# Patient Record
Sex: Female | Born: 1935 | Race: White | Hispanic: No | Smoking: Never smoker
Health system: Southern US, Community
[De-identification: ages and names within clinical notes are randomized; demographics above are authoritative.]

## PROBLEM LIST (undated history)

## (undated) DIAGNOSIS — K08109 Complete loss of teeth, unspecified cause, unspecified class: Secondary | ICD-10-CM

## (undated) DIAGNOSIS — G4733 Obstructive sleep apnea (adult) (pediatric): Secondary | ICD-10-CM

## (undated) DIAGNOSIS — I10 Essential (primary) hypertension: Secondary | ICD-10-CM

## (undated) DIAGNOSIS — I5032 Chronic diastolic (congestive) heart failure: Secondary | ICD-10-CM

## (undated) DIAGNOSIS — Z950 Presence of cardiac pacemaker: Secondary | ICD-10-CM

## (undated) DIAGNOSIS — E785 Hyperlipidemia, unspecified: Secondary | ICD-10-CM

## (undated) DIAGNOSIS — I739 Peripheral vascular disease, unspecified: Secondary | ICD-10-CM

## (undated) DIAGNOSIS — E039 Hypothyroidism, unspecified: Secondary | ICD-10-CM

## (undated) DIAGNOSIS — R931 Abnormal findings on diagnostic imaging of heart and coronary circulation: Secondary | ICD-10-CM

## (undated) DIAGNOSIS — I4821 Permanent atrial fibrillation: Secondary | ICD-10-CM

## (undated) DIAGNOSIS — Z993 Dependence on wheelchair: Secondary | ICD-10-CM

## (undated) DIAGNOSIS — I480 Paroxysmal atrial fibrillation: Secondary | ICD-10-CM

## (undated) DIAGNOSIS — K219 Gastro-esophageal reflux disease without esophagitis: Secondary | ICD-10-CM

## (undated) DIAGNOSIS — Z91041 Radiographic dye allergy status: Secondary | ICD-10-CM

## (undated) DIAGNOSIS — I442 Atrioventricular block, complete: Secondary | ICD-10-CM

## (undated) HISTORY — PX: CARDIAC CATHETERIZATION: SHX172

## (undated) HISTORY — DX: Permanent atrial fibrillation: I48.21

## (undated) HISTORY — PX: CARPAL TUNNEL RELEASE: SHX101

## (undated) HISTORY — DX: Essential (primary) hypertension: I10

## (undated) HISTORY — DX: Hypothyroidism, unspecified: E03.9

## (undated) HISTORY — DX: Gastro-esophageal reflux disease without esophagitis: K21.9

## (undated) HISTORY — DX: Atrioventricular block, complete: I44.2

## (undated) HISTORY — DX: Radiographic dye allergy status: Z91.041

## (undated) HISTORY — DX: Presence of cardiac pacemaker: Z95.0

## (undated) HISTORY — DX: Hyperlipidemia, unspecified: E78.5

## (undated) HISTORY — DX: Chronic diastolic (congestive) heart failure: I50.32

## (undated) HISTORY — PX: CHOLECYSTECTOMY OPEN: SUR202

## (undated) HISTORY — PX: APPENDECTOMY: SHX54

## (undated) HISTORY — DX: Peripheral vascular disease, unspecified: I73.9

## (undated) HISTORY — DX: Abnormal findings on diagnostic imaging of heart and coronary circulation: R93.1

## (undated) HISTORY — DX: Paroxysmal atrial fibrillation: I48.0

## (undated) HISTORY — PX: CARDIOVERSION: SHX1299

## (undated) HISTORY — DX: Obstructive sleep apnea (adult) (pediatric): G47.33

## (undated) HISTORY — PX: VESICOVAGINAL FISTULA CLOSURE W/ TAH: SUR271

---

## 2005-03-11 ENCOUNTER — Emergency Department: Payer: Self-pay | Admitting: Internal Medicine

## 2005-03-11 ENCOUNTER — Other Ambulatory Visit: Payer: Self-pay

## 2006-01-06 ENCOUNTER — Emergency Department (HOSPITAL_COMMUNITY): Admission: EM | Admit: 2006-01-06 | Discharge: 2006-01-06 | Payer: Self-pay | Admitting: Emergency Medicine

## 2006-02-23 ENCOUNTER — Inpatient Hospital Stay (HOSPITAL_COMMUNITY): Admission: RE | Admit: 2006-02-23 | Discharge: 2006-02-26 | Payer: Self-pay | Admitting: Gastroenterology

## 2006-02-24 ENCOUNTER — Encounter (INDEPENDENT_AMBULATORY_CARE_PROVIDER_SITE_OTHER): Payer: Self-pay | Admitting: Specialist

## 2006-02-24 ENCOUNTER — Encounter (INDEPENDENT_AMBULATORY_CARE_PROVIDER_SITE_OTHER): Payer: Self-pay | Admitting: Cardiology

## 2007-08-10 ENCOUNTER — Ambulatory Visit: Payer: Self-pay | Admitting: Internal Medicine

## 2008-02-08 ENCOUNTER — Emergency Department: Payer: Self-pay | Admitting: Emergency Medicine

## 2009-04-24 ENCOUNTER — Encounter: Payer: Self-pay | Admitting: Cardiovascular Disease

## 2009-09-05 DIAGNOSIS — R931 Abnormal findings on diagnostic imaging of heart and coronary circulation: Secondary | ICD-10-CM

## 2009-09-05 HISTORY — DX: Abnormal findings on diagnostic imaging of heart and coronary circulation: R93.1

## 2009-09-08 ENCOUNTER — Inpatient Hospital Stay: Payer: Self-pay | Admitting: Internal Medicine

## 2009-09-08 ENCOUNTER — Ambulatory Visit: Payer: Self-pay | Admitting: Cardiovascular Disease

## 2009-09-08 ENCOUNTER — Encounter: Payer: Self-pay | Admitting: Cardiology

## 2009-09-10 ENCOUNTER — Encounter: Payer: Self-pay | Admitting: Cardiology

## 2009-09-11 ENCOUNTER — Encounter: Payer: Self-pay | Admitting: Cardiology

## 2009-09-19 ENCOUNTER — Encounter: Payer: Self-pay | Admitting: Cardiology

## 2009-09-19 ENCOUNTER — Ambulatory Visit: Payer: Self-pay | Admitting: Cardiology

## 2009-09-19 DIAGNOSIS — I4821 Permanent atrial fibrillation: Secondary | ICD-10-CM | POA: Insufficient documentation

## 2009-09-19 DIAGNOSIS — I4891 Unspecified atrial fibrillation: Secondary | ICD-10-CM

## 2009-09-19 DIAGNOSIS — R0602 Shortness of breath: Secondary | ICD-10-CM | POA: Insufficient documentation

## 2009-09-19 DIAGNOSIS — E785 Hyperlipidemia, unspecified: Secondary | ICD-10-CM | POA: Insufficient documentation

## 2009-09-19 LAB — CONVERTED CEMR LAB: POC INR: 5.2

## 2009-09-22 ENCOUNTER — Telehealth: Payer: Self-pay | Admitting: Cardiovascular Disease

## 2009-09-23 ENCOUNTER — Encounter: Payer: Self-pay | Admitting: Cardiology

## 2009-09-23 ENCOUNTER — Ambulatory Visit: Payer: Self-pay | Admitting: Cardiology

## 2009-09-23 LAB — PULMONARY FUNCTION TEST

## 2009-09-30 ENCOUNTER — Encounter: Payer: Self-pay | Admitting: Cardiology

## 2009-09-30 ENCOUNTER — Ambulatory Visit: Payer: Self-pay | Admitting: Internal Medicine

## 2009-10-01 ENCOUNTER — Encounter: Payer: Self-pay | Admitting: Cardiology

## 2009-10-01 LAB — CONVERTED CEMR LAB
ALT: 16 units/L (ref 0–35)
AST: 24 units/L (ref 0–37)
Albumin: 4.3 g/dL (ref 3.5–5.2)
Alkaline Phosphatase: 67 units/L (ref 39–117)
BUN: 12 mg/dL (ref 6–23)
CO2: 17 meq/L — ABNORMAL LOW (ref 19–32)
Calcium: 9.6 mg/dL (ref 8.4–10.5)
Chloride: 106 meq/L (ref 96–112)
Cholesterol: 294 mg/dL — ABNORMAL HIGH (ref 0–200)
Creatinine, Ser: 0.94 mg/dL (ref 0.40–1.20)
Glucose, Bld: 73 mg/dL (ref 70–99)
HDL: 44 mg/dL (ref >39)
Potassium: 4.5 meq/L (ref 3.5–5.3)
Pro B Natriuretic peptide (BNP): 26.1 pg/mL (ref 0.0–100.0)
Sodium: 137 meq/L (ref 135–145)
Total Bilirubin: 0.4 mg/dL (ref 0.3–1.2)
Total CHOL/HDL Ratio: 6.7
Total Protein: 7.2 g/dL (ref 6.0–8.3)
Triglycerides: 459 mg/dL — ABNORMAL HIGH (ref <150)

## 2009-10-06 LAB — CONVERTED CEMR LAB: Direct LDL: 146 mg/dL — ABNORMAL HIGH

## 2009-10-20 ENCOUNTER — Encounter: Payer: Self-pay | Admitting: Cardiovascular Disease

## 2009-10-20 ENCOUNTER — Ambulatory Visit: Payer: Self-pay | Admitting: Cardiovascular Disease

## 2009-10-20 DIAGNOSIS — I10 Essential (primary) hypertension: Secondary | ICD-10-CM | POA: Insufficient documentation

## 2009-10-20 LAB — CONVERTED CEMR LAB: POC INR: 3.4

## 2009-11-10 ENCOUNTER — Ambulatory Visit: Payer: Self-pay | Admitting: Cardiovascular Disease

## 2009-11-10 LAB — CONVERTED CEMR LAB: POC INR: 2.1

## 2009-12-09 ENCOUNTER — Ambulatory Visit: Payer: Self-pay | Admitting: Cardiovascular Disease

## 2009-12-09 LAB — CONVERTED CEMR LAB: POC INR: 1.5

## 2009-12-19 ENCOUNTER — Ambulatory Visit: Payer: Self-pay | Admitting: Cardiovascular Disease

## 2009-12-19 LAB — CONVERTED CEMR LAB: POC INR: 2.1

## 2010-01-01 ENCOUNTER — Telehealth: Payer: Self-pay | Admitting: Cardiovascular Disease

## 2010-01-07 ENCOUNTER — Ambulatory Visit: Payer: Self-pay | Admitting: Cardiovascular Disease

## 2010-01-07 LAB — CONVERTED CEMR LAB: POC INR: 2

## 2010-01-15 ENCOUNTER — Encounter: Payer: Self-pay | Admitting: Cardiovascular Disease

## 2010-01-21 ENCOUNTER — Telehealth (INDEPENDENT_AMBULATORY_CARE_PROVIDER_SITE_OTHER): Payer: Self-pay | Admitting: *Deleted

## 2010-01-22 ENCOUNTER — Ambulatory Visit: Payer: Self-pay | Admitting: Cardiovascular Disease

## 2010-01-22 ENCOUNTER — Telehealth: Payer: Self-pay | Admitting: Cardiovascular Disease

## 2010-02-04 ENCOUNTER — Ambulatory Visit: Payer: Self-pay | Admitting: Cardiology

## 2010-02-04 LAB — CONVERTED CEMR LAB: POC INR: 2.2

## 2010-02-16 ENCOUNTER — Ambulatory Visit: Payer: Self-pay | Admitting: Cardiovascular Disease

## 2010-02-16 ENCOUNTER — Inpatient Hospital Stay: Payer: Self-pay | Admitting: Internal Medicine

## 2010-02-19 ENCOUNTER — Encounter: Payer: Self-pay | Admitting: Cardiovascular Disease

## 2010-02-20 ENCOUNTER — Telehealth: Payer: Self-pay | Admitting: Cardiovascular Disease

## 2010-02-25 ENCOUNTER — Ambulatory Visit: Payer: Self-pay | Admitting: Cardiovascular Disease

## 2010-03-04 ENCOUNTER — Ambulatory Visit: Payer: Self-pay | Admitting: Cardiology

## 2010-03-04 LAB — CONVERTED CEMR LAB: POC INR: 3.1

## 2010-03-05 ENCOUNTER — Telehealth: Payer: Self-pay | Admitting: Cardiovascular Disease

## 2010-03-18 ENCOUNTER — Telehealth: Payer: Self-pay | Admitting: Cardiovascular Disease

## 2010-03-18 ENCOUNTER — Ambulatory Visit: Payer: Self-pay | Admitting: Cardiovascular Disease

## 2010-03-18 LAB — CONVERTED CEMR LAB: POC INR: 3.3

## 2010-03-27 ENCOUNTER — Telehealth: Payer: Self-pay | Admitting: Cardiovascular Disease

## 2010-04-01 ENCOUNTER — Ambulatory Visit: Payer: Self-pay | Admitting: Cardiovascular Disease

## 2010-04-01 LAB — CONVERTED CEMR LAB: POC INR: 3.5

## 2010-04-07 ENCOUNTER — Ambulatory Visit: Payer: Self-pay | Admitting: Cardiovascular Disease

## 2010-04-08 ENCOUNTER — Telehealth: Payer: Self-pay | Admitting: Cardiovascular Disease

## 2010-04-10 ENCOUNTER — Encounter: Payer: Self-pay | Admitting: Cardiovascular Disease

## 2010-04-10 ENCOUNTER — Ambulatory Visit: Payer: Self-pay | Admitting: Internal Medicine

## 2010-04-13 ENCOUNTER — Ambulatory Visit: Payer: Self-pay | Admitting: Internal Medicine

## 2010-04-13 ENCOUNTER — Encounter: Payer: Self-pay | Admitting: Internal Medicine

## 2010-04-13 DIAGNOSIS — R259 Unspecified abnormal involuntary movements: Secondary | ICD-10-CM | POA: Insufficient documentation

## 2010-04-13 DIAGNOSIS — R5383 Other fatigue: Secondary | ICD-10-CM

## 2010-04-13 DIAGNOSIS — R5381 Other malaise: Secondary | ICD-10-CM | POA: Insufficient documentation

## 2010-04-13 LAB — CONVERTED CEMR LAB: POC INR: 3.3

## 2010-04-22 ENCOUNTER — Ambulatory Visit: Payer: Self-pay | Admitting: Cardiovascular Disease

## 2010-04-22 LAB — CONVERTED CEMR LAB: POC INR: 1.9

## 2010-05-06 ENCOUNTER — Ambulatory Visit: Payer: Self-pay | Admitting: Cardiovascular Disease

## 2010-05-06 LAB — CONVERTED CEMR LAB: POC INR: 1.8

## 2010-05-12 ENCOUNTER — Telehealth: Payer: Self-pay | Admitting: Internal Medicine

## 2010-05-12 ENCOUNTER — Ambulatory Visit: Payer: Self-pay | Admitting: Internal Medicine

## 2010-05-20 ENCOUNTER — Ambulatory Visit: Payer: Self-pay | Admitting: Cardiovascular Disease

## 2010-05-20 LAB — CONVERTED CEMR LAB: POC INR: 2.5

## 2010-06-03 ENCOUNTER — Ambulatory Visit: Payer: Self-pay | Admitting: Cardiovascular Disease

## 2010-06-03 LAB — CONVERTED CEMR LAB: POC INR: 1.7

## 2010-06-10 ENCOUNTER — Ambulatory Visit
Admission: RE | Admit: 2010-06-10 | Discharge: 2010-06-10 | Payer: Self-pay | Source: Home / Self Care | Attending: Internal Medicine | Admitting: Internal Medicine

## 2010-06-10 ENCOUNTER — Encounter: Payer: Self-pay | Admitting: Internal Medicine

## 2010-06-11 ENCOUNTER — Encounter: Payer: Self-pay | Admitting: Cardiovascular Disease

## 2010-06-12 ENCOUNTER — Inpatient Hospital Stay (HOSPITAL_COMMUNITY)
Admission: RE | Admit: 2010-06-12 | Discharge: 2010-06-14 | Payer: Self-pay | Source: Home / Self Care | Attending: Internal Medicine | Admitting: Internal Medicine

## 2010-06-12 HISTORY — PX: ATRIAL ABLATION SURGERY: SHX560

## 2010-06-12 HISTORY — PX: PACEMAKER INSERTION: SHX728

## 2010-06-12 LAB — PROTIME-INR
INR: 1.27 (ref 0.00–1.49)
Prothrombin Time: 16.1 seconds — ABNORMAL HIGH (ref 11.6–15.2)

## 2010-06-17 ENCOUNTER — Ambulatory Visit: Admission: RE | Admit: 2010-06-17 | Discharge: 2010-06-17 | Payer: Self-pay | Source: Home / Self Care

## 2010-06-22 LAB — CBC
HCT: 34.4 % — ABNORMAL LOW (ref 36.0–46.0)
Hemoglobin: 11.6 g/dL — ABNORMAL LOW (ref 12.0–15.0)
MCH: 30.9 pg (ref 26.0–34.0)
MCHC: 33.7 g/dL (ref 30.0–36.0)
MCV: 91.7 fL (ref 78.0–100.0)
Platelets: 191 10*3/uL (ref 150–400)
RBC: 3.75 MIL/uL — ABNORMAL LOW (ref 3.87–5.11)
RDW: 14.4 % (ref 11.5–15.5)
WBC: 14.1 10*3/uL — ABNORMAL HIGH (ref 4.0–10.5)

## 2010-06-22 LAB — BASIC METABOLIC PANEL
BUN: 25 mg/dL — ABNORMAL HIGH (ref 6–23)
BUN: 24 mg/dL — ABNORMAL HIGH (ref 6–23)
CO2: 23 mEq/L (ref 19–32)
CO2: 22 mEq/L (ref 19–32)
Calcium: 8.9 mg/dL (ref 8.4–10.5)
Calcium: 9.2 mg/dL (ref 8.4–10.5)
Chloride: 100 mEq/L (ref 96–112)
Chloride: 102 mEq/L (ref 96–112)
Creatinine, Ser: 1.35 mg/dL — ABNORMAL HIGH (ref 0.4–1.2)
Creatinine, Ser: 1.18 mg/dL (ref 0.4–1.2)
GFR calc Af Amer: 47 mL/min — ABNORMAL LOW (ref >60)
GFR calc Af Amer: 54 mL/min — ABNORMAL LOW (ref >60)
GFR calc non Af Amer: 38 mL/min — ABNORMAL LOW (ref >60)
GFR calc non Af Amer: 45 mL/min — ABNORMAL LOW (ref >60)
Glucose, Bld: 116 mg/dL — ABNORMAL HIGH (ref 70–99)
Glucose, Bld: 103 mg/dL — ABNORMAL HIGH (ref 70–99)
Potassium: 3.8 mEq/L (ref 3.5–5.1)
Potassium: 3.7 mEq/L (ref 3.5–5.1)
Sodium: 134 mEq/L — ABNORMAL LOW (ref 135–145)
Sodium: 136 mEq/L (ref 135–145)

## 2010-06-22 LAB — PROTIME-INR
INR: 1.13 (ref 0.00–1.49)
INR: 1.2 (ref 0.00–1.49)
Prothrombin Time: 14.7 seconds (ref 11.6–15.2)
Prothrombin Time: 15.4 seconds — ABNORMAL HIGH (ref 11.6–15.2)

## 2010-06-23 ENCOUNTER — Telehealth: Payer: Self-pay | Admitting: Cardiovascular Disease

## 2010-06-24 ENCOUNTER — Telehealth: Payer: Self-pay | Admitting: Cardiovascular Disease

## 2010-06-25 ENCOUNTER — Encounter: Payer: Self-pay | Admitting: Cardiovascular Disease

## 2010-06-25 NOTE — Op Note (Signed)
  NAMECHRISTIANE, SISTARE             ACCOUNT NO.:  [id number]  MEDICAL RECORD NO.:  [id number]          PATIENT TYPE:  INP  LOCATION:  2909                         FACILITY:  [facility]  PHYSICIAN:  Duke Salvia, MD, FACCDATE OF BIRTH:  1934-06-20  DATE OF PROCEDURE:  06/12/2010 DATE OF DISCHARGE:                              OPERATIVE REPORT   PREOPERATIVE DIAGNOSIS:  Status post pacer with uncontrolled ventricular response.  POSTOPERATIVE DIAGNOSIS:  Status post pacer with uncontrolled ventricular response.  PROCEDURE:  HV recording and AV junction ablation.  SURGEON:  Duke Salvia, MD, [facility]  DESCRIPTION OF PROCEDURE:  Following obtaining informed consent and placement of the pacemaker, the right femoral vein was cannulated.  A CVP was measured and it was 18-20 cm.  A SR-0 sheath was placed and through this was passed a 4-mm tip ablation catheter.  A His recording of 51 milliseconds was identified.  A total of 30 seconds of RF was applied.  Complete heart block ensued.  A total of 16 minutes and 30 seconds of RF was utilized for the combined procedure, 1 minute and 9 seconds of which was utilized for this procedure.  The patient tolerated the procedure without apparent complication.  The patient had no underlying rhythm.     Duke Salvia, MD, [facility]     SCK/MEDQ  D:  06/12/2010  T:  06/12/2010  Job:  [id number]  Electronically Signed by Sherryl Manges MD [facility] on 06/25/2010 01:40:41 PM

## 2010-06-25 NOTE — Op Note (Signed)
Jacqueline Lozano, Jacqueline Lozano             ACCOUNT NO.:  [id number]  MEDICAL RECORD NO.:  [id number]          PATIENT TYPE:  INP  LOCATION:  2909                         FACILITY:  [facility]  PHYSICIAN:  Duke Salvia, MD, FACCDATE OF BIRTH:  May 03, 1935  DATE OF PROCEDURE:  06/12/2010 DATE OF DISCHARGE:                              OPERATIVE REPORT   PREOPERATIVE DIAGNOSIS:  Atrial fibrillation with uncontrolled ventricular response with anticipated atrioventricular junction ablation.  POSTOPERATIVE DIAGNOSIS:  Atrial fibrillation with uncontrolled ventricular response with anticipated atrioventricular junction ablation.  PROCEDURE:  Dual-chamber pacemaker implantation with a left ventricular lead placement.  Following obtaining informed consent, the patient was brought to the electrophysiology laboratory and placed on the fluoroscopic table in supine position.  After routine prep and drape of the left upper chest, lidocaine was infiltrated in the prepectoral subclavicular region. Incision was made and carried down to layer of the prepectoral fascia using electrocautery and sharp dissection.  A pocket was formed similarly.  Hemostasis was obtained.  Thereafter, attention was turned gain to the access to extrathoracic left subclavian vein, which was accomplished without difficulty without the aspiration of air or puncture of the artery.  Two separate venipunctures were accomplished.  Guidewires were placed and retained. Sequentially, a 7-French and 9.5-French sheath were placed, which were passed a Medtronic 5076, 52-cm active fixation ventricular lead, serial number [id number] and a Medtronic MB2 coronary sinus cannulation catheter was subsequently replaced with a multipurpose catheter.  The RV lead was manipulated under fluoroscopic guidance through the RV apex where the bipolar R-wave was 9.4 with a pace impedance of 691, a threshold of 1 volt at 0.5.  The current threshold was 1.3  mA.  There was no diaphragmatic pacing at 10 volts and the current of injury was brisk.  This lead was secured to the prepectoral fascia.  The MB2 did not allow for cannulation of the coronary sinus, the multipurpose did.  There was a valve halfway up.  We were able to get past that.  There was a low lateral vein, which we had difficulty cannulating, so we went up high, we found a high lateral vein that actually coursed down on to the midlateral portion.  We then placed a wire into this vein and tried with a Medtronic 4396 lead to place it. However, across the length of stability, there was inadequate ventricular capture.  We then removed the lead under fluoroscopy and placed a [employer] QuickFlex 1258T lead, serial number [id number].  It was placed at a junction between the mid and distal third and in this location, the bipolar L-wave was 12.6 with a pace impedance of 1385, a pacing threshold 3.4 volts at 0.5 milliseconds, and current threshold is 4.3 mA.  There is no diaphragmatic pacing at 10 volts.  This lead was secured to the prepectoral fascia.  Following removal of the delivery system and the leads were then attached to Medtronic C4TR01 pulse generator, serial number [id number].  The leads and pulse generator were placed in the pocket, secured to the prepectoral fascia.  Hemostasis having been obtained, the pocket remained copiously irrigated with antibiotic containing saline solution.  The wound was closed in three layers in normal fashion.  The wound was washed, dried,  and a benzoin Steri-Strip dressing was applied.  Needle counts, sponge counts, and instrument counts were correct at the end of the procedure according to staff.  The patient tolerated the procedure without apparent complication.     Duke Salvia, MD, [facility]     SCK/MEDQ  D:  06/12/2010  T:  06/12/2010  Job:  [id number]  Electronically Signed by Sherryl Manges MD [facility] on 06/25/2010 01:40:43 PM

## 2010-06-26 ENCOUNTER — Ambulatory Visit: Admission: RE | Admit: 2010-06-26 | Discharge: 2010-06-26 | Payer: Self-pay | Source: Home / Self Care

## 2010-06-26 ENCOUNTER — Encounter: Payer: Self-pay | Admitting: Internal Medicine

## 2010-06-29 ENCOUNTER — Inpatient Hospital Stay: Payer: Self-pay | Admitting: Specialist

## 2010-06-29 ENCOUNTER — Encounter: Payer: Self-pay | Admitting: Cardiovascular Disease

## 2010-06-30 ENCOUNTER — Encounter: Payer: Self-pay | Admitting: Cardiovascular Disease

## 2010-07-01 ENCOUNTER — Encounter: Payer: Self-pay | Admitting: Cardiovascular Disease

## 2010-07-02 LAB — SURGICAL PCR SCREEN
MRSA, PCR: NEGATIVE
Staphylococcus aureus: NEGATIVE

## 2010-07-03 ENCOUNTER — Telehealth: Payer: Self-pay | Admitting: Cardiovascular Disease

## 2010-07-07 NOTE — Medication Information (Signed)
Summary: rov/ewj  Anticoagulant Therapy  Managed by: Cloyde Reams, RN, BSN Referring MD: Mariah Milling PCP: Dr. Moses Manners MD: Mariah Milling Indication 1: Atrial Fibrillation Lab Used: [facility] Site: [place] INR POC 3.3 INR RANGE 2.0-3.0  Dietary changes: no    Health status changes: no    Bleeding/hemorrhagic complications: no    Recent/future hospitalizations: no    Any changes in medication regimen? no    Recent/future dental: no  Any missed doses?: no       Is patient compliant with meds? yes       Allergies: 1)  ! Codeine 2)  ! Percocet 3)  ! Morphine 4)  ! Sulfa 5)  ! Persantine 6)  ! * Statins 7)  ! Iodine 8)  ! * Ivp Dye  Anticoagulation Management History:      The patient is taking warfarin and comes in today for a routine follow up visit.  Positive risk factors for bleeding include an age of 75 years or older.  The bleeding index is 'intermediate risk'.  Positive CHADS2 values include History of HTN.  Negative CHADS2 values include Age > 23 years old.  Anticoagulation responsible provider: [name].  INR POC: 3.3.  Cuvette Lot#: [id number].  Exp: 04/2011.    Anticoagulation Management Assessment/Plan:      The patient's current anticoagulation dose is Coumadin 4 mg tabs: Take as directed.  The target INR is 2.0-3.0.  The next INR is due 04/01/2010.  Anticoagulation instructions were given to patient.  Results were reviewed/authorized by Cloyde Reams, RN, BSN.  She was notified by Cloyde Reams RN.         Prior Anticoagulation Instructions: INR 3.1  Start taking 1.5 tablets daily except 1 tablet on Wednesdays and Saturdays.  Recheck in 2 weeks.    Current Anticoagulation Instructions: INR 3.3  Skip today's dosage of Coumadin, then resume 1.5 tablets daily except 1 tablet on Wednesdays and Saturdays.  Recheck in 2 weeks.   Prescriptions: CARDIZEM CD 240 MG XR24H-CAP (DILTIAZEM HCL COATED BEADS) Take 1 tablet by mouth once a day   #30 x 6   Entered by:   Cloyde Reams RN   Authorized by:   [name] MD   Signed by:   Cloyde Reams RN on 03/18/2010   Method used:   Electronically to        [facility] [address] (retail)       [address]       [place], [place]  [address]       Ph: [phone]       Fax: [phone]   RxID:   [id number]

## 2010-07-07 NOTE — Progress Notes (Signed)
Summary: HA, blurred vision, and hypertension  Phone Note Call from Patient Call back at Home Phone [phone]   Reason for Call: Talk to Doctor Summary of Call: Returned call from patient stating she was feeling a little loopy, with a HA, blurred vision, and SBP of 209.  She has known Afib and states she feels as if her heart is pounding out of her chest.  She says the HA and blurred vision feel like her migraines that she has had in the past.  She has taken all her prescribed medication for the day and is s/p cardioversion in April.  She has 3 tablets of metoprolol 50 mg left over and wanted to know if she could take them.  I have informed her she can try taking one pill and if symptoms are not improved she needs to go the the ER for further evaluation.  She also needs to schedule a follow up tomorrow if she decides not to go to the ER to ensure she is not back in Afib.  She voices understanding and says she will go to the ER if her symptoms are not improved within the hour.  Initial call taken by: Robbi Garter NP-PA,  January 21, 2010 8:24 PM

## 2010-07-07 NOTE — Medication Information (Signed)
Summary: [facility]  Anticoagulant Therapy  Managed by: Charlena Cross, RN, BSN Referring MD: Cherlynn Polo MD: Shirlee Latch MD, Ziza Hastings Indication 1: Atrial Fibrillation Lab Used: [facility] [place] Site: [place] INR POC 5.2 INR RANGE 2.0-3.0  Dietary changes: no    Health status changes: no    Bleeding/hemorrhagic complications: no    Recent/future hospitalizations: no    Any changes in medication regimen? no    Recent/future dental: no  Any missed doses?: no       Is patient compliant with meds? yes       Allergies: 1)  ! Codeine 2)  ! Percocet 3)  ! Morphine 4)  ! Sulfa 5)  ! Persantine 6)  ! * Statins 7)  ! Iodine 8)  ! * Ivp Dye  Anticoagulation Management History:      The patient comes in today for her initial visit for anticoagulation therapy.  Positive risk factors for bleeding include an age of 74 years or older.  The bleeding index is 'intermediate risk'.  Negative CHADS2 values include Age > 74 years old.  Anticoagulation responsible provider: Shirlee Latch MD, Aarya Robinson.  INR POC: 5.2.    Anticoagulation Management Assessment/Plan:      The patient's current anticoagulation dose is Coumadin 4 mg tabs: Take as directed.  The target INR is 2.0-3.0.  The next INR is due 09/26/2009.  Anticoagulation instructions were given to patient.  Results were reviewed/authorized by Charlena Cross, RN, BSN.  She was notified by Charlena Cross, RN, BSN.         Current Anticoagulation Instructions: No coumadin for 3 days then coumadin 6 mg daily with 8 mg on Tues and Thurs

## 2010-07-07 NOTE — Progress Notes (Signed)
Summary: RX  Phone Note Refill Request Call back at Home Phone [phone] Message from:  Patient on March 18, 2010 2:34 PM  Refills Requested: Medication #1:  CARDIZEM CD 240 MG XR24H-CAP Take 1 tablet by mouth once a day. [facility] [address]  Initial call taken by: Harlon Flor,  March 18, 2010 2:35 PM    Prescriptions: CARDIZEM CD 240 MG XR24H-CAP (DILTIAZEM HCL COATED BEADS) Take 1 tablet by mouth once a day  #30 x 6   Entered by:   Bishop Dublin, CMA   Authorized by:   [name] MD   Signed by:   Bishop Dublin, CMA on 03/18/2010   Method used:   Electronically to        [facility] [address] (retail)       [address]       [place], [place]  [address]       Ph: [phone]       Fax: [phone]   RxID:   [id number]

## 2010-07-07 NOTE — Progress Notes (Signed)
Summary: Pacmaker  Phone Note Call from Patient Call back at [facility] Phone [phone]   Caller: Self Call For: Jacqueline Lozano Summary of Call: Pt has questions about a pacemaker.  She and Jacqueline Lozano discussed getting one at her appt today and it is not noted in her instruction sheet that she was given at her appt today. Initial call taken by: Harlon Flor,  May 12, 2010 4:28 PM  Follow-up for Phone Call        Called spoke with pt advised we will be scheduling her ablation and pacer insertion for January.  Pt states she has an appt with Dr Jacqueline Lozano on 06/10/10 and does not want appt before then.  Advised pt we will schedule procedure after that appt date.  Follow-up by: Cloyde Reams RN,  May 14, 2010 10:05 AM

## 2010-07-07 NOTE — Progress Notes (Signed)
Summary: QUESTION ABOUT MEDS  Phone Note Call from Patient Call back at Home Phone [phone]   Caller: SELF Call For: [facility] Summary of Call: QUESTION ABOUT AMIODERONE AND LOPRESSOR Initial call taken by: Harlon Flor,  March 27, 2010 10:48 AM  Follow-up for Phone Call        Questions answered.  Follow-up by: Benedict Needy, RN,  March 27, 2010 11:24 AM

## 2010-07-07 NOTE — Assessment & Plan Note (Addendum)
Summary: F1M/AMD   Visit Type:  Follow-up Primary Provider:  Dr. Burnett Sheng  CC:  1 mth f/u. c/o SOB. Denies chest pain.Marland Kitchen  History of Present Illness: 76 yo with history of paroxysmal atrial fibrillation presents for followup of recent hospitalization with afib/rapid ventricular response.  Patient had been on sotalol for about the last 2 years but had had episodes of breakthrough atrial fibrillation on sotalol.  She was noted by her PCP at a routine appointment in 4/11 to be in atrial fib with RVR (in the setting of a UTI).  She was admitted to [facility].  She was started on amiodarone and underwent DCCV to NSR.  She was discharged home on amiodarone 200 mg two times a day. she was seen last month for rapid atrial fibrillation in the hospital and again went through a DC cardioversion with discharge on diltiazem, beta blockers, amiodarone. she was successfully holding sinus rhythm.  She presents today in atrial fibrillation with rates greater than 130 at rest, symptoms of mild shortness of breath. She reports that she has been taking her medications on a consistent basis though recently her amiodarone was decreased from 200 mg b.i.d. to daily. She does have obstructive sleep apnea but uses her CPAP on a consistent basis. she believes that one of the medications, Lopressor or amiodarone, is causing a significant tremor.  PFTs were done by Dr. Meredeth Ide which suggested some restrictive and obstructive deficits. Please see the report for full details. It was suggested that her weight may be playing a role in her shortness of breath.  labs from September 30, 2009 shows total cholesterol 294, LDL 146, triglycerides 459, HDL 44. She states that she has muscle  problems on statins.  She continues wi significant sob  ; recent amio level 0.8  Current Medications (verified): 1)  Prilosec 20 Mg Cpdr (Omeprazole) .... Take 1 Tablet By Mouth Two Times A Day 2)  Synthroid 125 Mcg Tabs (Levothyroxine Sodium) .... Take 1 By  Mouth Once Daily 3)  Losartan Potassium 100 Mg Tabs (Losartan Potassium) .... Take 1 Tablet By Mouth Once A Day 4)  Coumadin 4 Mg Tabs (Warfarin Sodium) .... Take As Directed 5)  Symbicort 160-4.5 Mcg/act Aero (Budesonide-Formoterol Fumarate) .... Up To 2 Puffs Once Daily 6)  B Complex-B12  Tabs (B Complex Vitamins) .... Take 1 By Mouth Once Daily 7)  Fioricet 50-325-40 Mg Tabs (Butalbital-Apap-Caffeine) .... Take Up To 2 By Mouth Up To Four Times A Day As Needed 8)  Vitamin D .... Every Week 9)  Furosemide 20 Mg Tabs (Furosemide) .... Take One Tablet By Mouth Daily. (Stopped 10/15/09) 10)  Klor-Con M10 10 Meq Cr-Tabs (Potassium Chloride Crys Cr) .Marland Kitchen.. 1 Tab By Mouth Daily (Stopped 10/15/09) 11)  Metoprolol Tartrate 50 Mg Tabs (Metoprolol Tartrate) .... Take 1 1/2  Tablets By Mouth Two Times A Day 12)  Cardizem Cd 240 Mg Xr24h-Cap (Diltiazem Hcl Coated Beads) .... Take 1 Tablet By Mouth Two Times A Day  Allergies (verified): 1)  ! Codeine 2)  ! Percocet 3)  ! Morphine 4)  ! Sulfa 5)  ! Persantine 6)  ! * Statins 7)  ! Iodine 8)  ! * Ivp Dye  Past History:  Past Medical History: Last updated: 09/19/2009 1. HTN 2. Hypothyroidism 3. Hyperlipidemia: has been unable to take statins due to muscle pain.  Has tried multiple statins per her report. ' 4. GERD 5. Migraines 6. OSA: not consistently using CPAP 7. CCY  8. Left heart cath  x 3 in the past, last 5 years ago.  All "normal" per her report.   9. Contrast allergy 10. Echo (4/11): EF > 55%, mild LVH, moderate LAE, normal RV, normal PA systolic pressure 11. PAD: occlusive disease involving left PT and AT.  12. Paroxysmal atrial fibrillation: Breakthrough afib on sotalol, changed to amiodarone 4/11.  DCCV 4/11.   Family History: Last updated: 09/19/2009 Father with MI at age 65. Daughter with colon cancer. Mother with MI x 2.   Social History: Last updated: 09/19/2009 From [place], widowed, never smoked.  Lives alone.    Vital  Signs:  Patient profile:   76 year old female Height:      62.5 inches Weight:      223.75 pounds BMI:     40.42 Pulse rate:   118 / minute BP sitting:   122 / 68  (left arm) Cuff size:   large  Vitals Entered By: Lysbeth Galas CMA (May 12, 2010 9:08 AM)  Physical Exam  General:  The patient was alert and oriented in no acute distress.Neck veins were flat, carotids were brisk. Lungs were clear. Heart sounds were irregular without murmurs or gallops. Abdomen was soft with active bowel sounds. There is no clubbing cyanosis; tr aedema  Mouth:  Teeth, gums and palate normal. Oral mucosa normal.   Impression & Recommendations:  Problem # 1:  ATRIAL FIBRILLATION (ICD-427.31) we discussed rate v ryhtm and PVI  she is disinclined to pursue PVI with60% effectiveness, and welath of exposures to failure of theprocedure  We then broached AV ablation and CRT-P  She would like to pursue this, despite  device dependence and data suggesting imporvecd outcomes in PVI.  She is attarctedt o the simplicity of theporcedure, the high effectivensss.   We will schedule after christmas for now, will increasse lopressor, resume furosemid, and start dig Her updated medication list for this problem includes:    Coumadin 4 Mg Tabs (Warfarin sodium) .Marland Kitchen... Take as directed    Metoprolol Tartrate 50 Mg Tabs (Metoprolol tartrate) .Marland Kitchen... Take 1 1/2  tablets by mouth two times a day  Problem # 2:  DYSPNEA (ICD-786.05) this is manifestation of chf -diasystoklic Her updated medication list for this problem includes:    Losartan Potassium 100 Mg Tabs (Losartan potassium) .Marland Kitchen... Take 1 tablet by mouth once a day    Furosemide 20 Mg Tabs (Furosemide) .Marland Kitchen... Take one tablet by mouth daily. (stopped 10/15/09)    Metoprolol Tartrate 50 Mg Tabs (Metoprolol tartrate) .Marland Kitchen... Take 2 tablets two times a day    Cardizem Cd 240 Mg Xr24h-cap (Diltiazem hcl coated beads) .Marland Kitchen... Take 1 tablet by mouth two times a day    Digoxin 0.25  Mg Tabs (Digoxin) .Marland Kitchen... Take one tablet by mouth daily  Problem # 3:  HYPERTENSION, BENIGN (ICD-401.1) reasonablycontrolled  Her updated medication list for this problem includes:    Losartan Potassium 100 Mg Tabs (Losartan potassium) .Marland Kitchen... Take 1 tablet by mouth once a day    Furosemide 20 Mg Tabs (Furosemide) .Marland Kitchen... Take one tablet by mouth daily. (stopped 10/15/09)    Metoprolol Tartrate 50 Mg Tabs (Metoprolol tartrate) .Marland Kitchen... Take 2 tablets two times a day    Cardizem Cd 240 Mg Xr24h-cap (Diltiazem hcl coated beads) .Marland Kitchen... Take 1 tablet by mouth two times a day  Patient Instructions: 1)  Your physician has recommended you make the following change in your medication: Start Digoxin 0.25mg . Increase Metoprolol to 2 tablets two times a day.  Resume Furosemide 20mg  once daily.  2)  TO BE SCHEDULED JAN 2012: Your physician has recommended that you have an ablation.  Catheter ablation is a medical procedure used to treat some cardiac arrhythmias (irregular heartbeats). During catheter ablation, a long, thin, flexible tube is put into a blood vessel in your groin (upper thigh), or neck. This tube is called an ablation catheter. It is then guided to your heart through the blood vessel. Radiofrequency waves destroy small areas of heart tissue where abnormal heartbeats may cause an arrhythmia to start.  Please see the instruction sheet given to you today. 3)  Your physician recommends that you schedule a follow-up appointment in: 3 weeks with Dr Graciela Husbands Prescriptions: METOPROLOL TARTRATE 50 MG TABS (METOPROLOL TARTRATE) Take 2 tablets two times a day  #120 x 6   Entered by:   [name] RN   Authorized by:   Nathen May, MD, [facility]   Signed by:   [name] RN on 05/12/2010   Method used:   Electronically to        [facility] [address] (retail)       [address], [address]       [place], [place]  [address]       Ph: [phone]       Fax: [phone]   RxID:    [id number] DIGOXIN 0.25 MG TABS (DIGOXIN) Take one tablet by mouth daily  #30 x 6   Entered by:   [name] RN   Authorized by:   Nathen May, MD, [facility]   Signed by:   [name] RN on 05/12/2010   Method used:   Electronically to        [facility] [address] (retail)       [address], [address]       [place], [place]  [address]       Ph: [phone]       Fax: [phone]   RxID:   [id number]

## 2010-07-07 NOTE — Progress Notes (Signed)
Summary: MEDICATION  Phone Note Call from Patient Call back at Home Phone [phone]   Caller: SELF  Call For: [name] Summary of Call: PT WANTS TO KNOW IF SHE IS TO CONTINUE TAKING SOTALOL HCL 120 MG-IT WAS NOT LISTED ON HER DISCHARGE INSTRUCTIONS Initial call taken by: Harlon Flor,  February 20, 2010 11:32 AM  Follow-up for Phone Call        pt's meds changed while in [facility] reviewed medlist from discharge pt is not to take sotalol. Benedict Needy, RN  February 20, 2010 11:48 AM

## 2010-07-07 NOTE — Assessment & Plan Note (Signed)
Summary: NPH/AMD   CC:  Post Hosp F/U after cardioversion; C/O SOB.  Current Medications (verified): 1)  Prevacid 30 Mg Cpdr (Lansoprazole) .... Take 1 By Mouth Two Times A Day 2)  Toprol Xl 50 Mg Xr24h-Tab (Metoprolol Succinate) .... Take 1 1/2 Tablets By Mouth Once Daily 3)  Synthroid 125 Mcg Tabs (Levothyroxine Sodium) .... Take 1 By Mouth Once Daily 4)  Diovan 80 Mg Tabs (Valsartan) .... Take One Tablet By Mouth Two Times A Day 5)  Amiodarone Hcl 200 Mg Tabs (Amiodarone Hcl) .... Take One Tablet By Mouth Two Times A Day 6)  Coumadin 4 Mg Tabs (Warfarin Sodium) .... Take As Directed 7)  Cardizem Cd 240 Mg Xr24h-Cap (Diltiazem Hcl Coated Beads) .... Take 1 By Mouth Once Daily 8)  Symbicort 160-4.5 Mcg/act Aero (Budesonide-Formoterol Fumarate) .... Up To 2 Puffs Once Daily 9)  B Complex-B12  Tabs (B Complex Vitamins) .... Take 1 By Mouth Once Daily 10)  Fioricet 50-325-40 Mg Tabs (Butalbital-Apap-Caffeine) .... Take Up To 2 By Mouth Up To Four Times A Day As Needed 11)  Vitamin D .... Every Week  Allergies (verified): 1)  ! Codeine 2)  ! Percocet 3)  ! Morphine 4)  ! Sulfa 5)  ! Persantine 6)  ! * Statins 7)  ! Iodine 8)  ! * Ivp Dye  Vital Signs:  Patient profile:   76 year old female Height:      63 inches Weight:      223.75 pounds BMI:     39.78 Pulse rate:   74 / minute BP sitting:   144 / 72  (left arm)  Vitals Entered By: Stanton Kidney, EMT-P (September 19, 2009 11:05 AM)   Other Orders: Full Pulmonary Function Test (PFT)  Patient Instructions: 1)  Your physician recommends that you schedule a follow-up appointment in: 1 month 2)  Your physician recommends that you return for a FASTING lipid profile: 10 days (cmet, lipids, BNP) 3)  Your physician has recommended you make the following change in your medication: start lasix 20 mg daily, start potassium 10 mEq daily, decrease amiodarone to 200 mg daily 4)  Your physician has recommended that you have a pulmonary  function test.  Pulmonary Function Tests are a group of tests that measure how well air moves in and out of your lungs. 09/23/09 @ 7:30am.  You may have a light breakfast.   Prescriptions: KLOR-CON M10 10 MEQ CR-TABS (POTASSIUM CHLORIDE CRYS CR) 1 tab by mouth daily  #30 x 6   Entered by:   Charlena Cross, RN, BSN   Authorized by:   Marca Ancona, MD   Signed by:   Charlena Cross, RN, BSN on 09/19/2009   Method used:   Electronically to        [facility] [address] (retail)       [address], [address]       [place], [place]  [address]       Ph: [phone]       Fax: [phone]   RxID:   [id number] FUROSEMIDE 20 MG TABS (FUROSEMIDE) Take one tablet by mouth daily.  #30 x 6   Entered by:   Charlena Cross, RN, BSN   Authorized by:   Marca Ancona, MD   Signed by:   Charlena Cross, RN, BSN on 09/19/2009   Method used:   Electronically to        [facility]  [address] (retail)       [address]       [place], [place]  [address]       Ph: [phone]       Fax: [phone]   RxID:   [id number]   Appended Document: NPH/AMD     Primary Provider:  Dr. Burnett Sheng   History of Present Illness: 76 yo with history of paroxysmal atrial fibrillation presents for followup of recent hospitalization with afib/rapid ventricular response.  Patient had been on sotalol for about the last 2 years but had had episodes of breakthrough atrial fibrillation on sotalol.  She was noted by her PCP at a routine appointment in 74/11 to be in atrial fib with RVR (in the setting of a UTI).  She was admitted to [facility].  She was started on amiodarone and underwent DCCV to NSR.  She was discharged home on amiodarone 200 mg two times a day.  Since coming home, she has noted more exertional dyspnea than usual. She is short of breath after walking about 30 feet (distance to mailbox).  Before, she could walk up to a block  without being short of breath.  She sleeps on 2 pillows (probably some orthopnea).  No PND.  She has had some GI discomfort since starting amiodarone.    ECG: NSR, normal  Labs (4/11): creatinine 1.04, HCT 33.9, TSH normal  Allergies: 1)  ! Codeine 2)  ! Percocet 3)  ! Morphine 4)  ! Sulfa 5)  ! Persantine 6)  ! * Statins 7)  ! Iodine 8)  ! * Ivp Dye  Past History:  Past Medical History: 1. HTN 2. Hypothyroidism 3. Hyperlipidemia: has been unable to take statins due to muscle pain.  Has tried multiple statins per her report. ' 4. GERD 5. Migraines 6. OSA: not consistently using CPAP 7. CCY  8. Left heart cath x 3 in the past, last 5 years ago.  All "normal" per her report.   9. Contrast allergy 10. Echo (4/11): EF > 55%, mild LVH, moderate LAE, normal RV, normal PA systolic pressure 11. PAD: occlusive disease involving left PT and AT.  12. Paroxysmal atrial fibrillation: Breakthrough afib on sotalol, changed to amiodarone 4/11.  DCCV 4/11.   Family History: Father with MI at age 29. Daughter with colon cancer. Mother with MI x 2.   Social History: From [place], widowed, never smoked.  Lives alone.    Review of Systems       All systems reviewed and negative except as per HPI.   Physical Exam  General:  Well developed, well nourished, in no acute distress.  Obese.  Head:  normocephalic and atraumatic Nose:  no deformity, discharge, inflammation, or lesions Mouth:  Teeth, gums and palate normal. Oral mucosa normal. Neck:  Neck supple, JVP 8-9 cm. No masses, thyromegaly or abnormal cervical nodes. Lungs:  Clear bilaterally to auscultation and percussion. Heart:  Non-displaced PMI, chest non-tender; regular rate and rhythm, S1, S2 without rubs or gallops. 2/6 early systolic murmur RUSB. Carotid upstroke normal, no bruit. Feet are warm but I cannot palpate pedal pulses. No edema, no varicosities. Abdomen:  Bowel sounds positive; abdomen soft and non-tender without  masses, organomegaly, or hernias noted. No hepatosplenomegaly. Msk:  Back normal, normal gait. Muscle strength and tone normal. Extremities:  No clubbing or cyanosis. Neurologic:  Alert and oriented x 3. Skin:  Intact without lesions or rashes. Psych:  Normal affect.   Impression & Recommendations:  Problem # 1:  ATRIAL FIBRILLATION (ICD-427.31) Patient is in NSR today s/p DCCV.  She was taken off sotalol and is now on amiodarone.  She is having some GI upset that could be linked to amiodarone.  I will have her decrease amiodarone to 200 mg daily. She will need baseline PFTs.  She has had recent TSH.  Will check LFTs.  If she is on amiodarone for a prolonged time, she will need ophthalmic exam.  Continue coumadin, can consider switch to Pradaxa if her insurance will cover.   Problem # 2:  DYSPNEA (ICD-786.05) Patient appears to have diastolic CHF with some volume overload on exam today.  Start Lasix 20 mg daily + KCl 10 mEq daily.  BMET, BNP in 10 days.   She will followup in 1 month with Dr. Mariah Milling.   Problem # 3:  HYPERLIPIDEMIA-MIXED (ICD-272.4) Will check lipids. She has been unable to tolerate statins.

## 2010-07-07 NOTE — Medication Information (Signed)
Summary: [facility]  Anticoagulant Therapy  Managed by: Cloyde Reams, RN, BSN Referring MD: Mariah Milling PCP: Dr. Moses Manners MD: Mariah Milling Indication 1: Atrial Fibrillation Lab Used: [facility] [place] Site: [place] INR POC 1.5 INR RANGE 2.0-3.0  Dietary changes: no     Bleeding/hemorrhagic complications: no     Any changes in medication regimen? no     Any missed doses?: no       Is patient compliant with meds? yes       Allergies: 1)  ! Codeine 2)  ! Percocet 3)  ! Morphine 4)  ! Sulfa 5)  ! Persantine 6)  ! * Statins 7)  ! Iodine 8)  ! * Ivp Dye  Anticoagulation Management History:      The patient is taking warfarin and comes in today for a routine follow up visit.  Positive risk factors for bleeding include an age of 4 years or older.  The bleeding index is 'intermediate risk'.  Positive CHADS2 values include History of HTN.  Negative CHADS2 values include Age > 38 years old.  Anticoagulation responsible provider: [name].  INR POC: 1.5.  Cuvette Lot#: [id number].  Exp: 02/2011.    Anticoagulation Management Assessment/Plan:      The patient's current anticoagulation dose is Coumadin 4 mg tabs: Take as directed.  The target INR is 2.0-3.0.  The next INR is due 12/19/2009.  Anticoagulation instructions were given to patient.  Results were reviewed/authorized by Cloyde Reams, RN, BSN.  She was notified by Benedict Needy, RN.         Prior Anticoagulation Instructions: INR 2.1  Continue on same dose of 1.5 tablet daily except 2 tablets on Thursday.   Recheck in 4 weeks  Current Anticoagulation Instructions: INR 1.5  Take 2 tablets Tuesday and  Wednesday.  Then continue taking 1.5 tabs daily except 2 tabs on Thursday. Recheck in 10 days.

## 2010-07-07 NOTE — Medication Information (Signed)
Summary: [facility]  Anticoagulant Therapy  Managed by: Cloyde Reams, RN, BSN Referring MD: Mariah Milling PCP: Dr. Moses Manners MD: Mariah Milling Indication 1: Atrial Fibrillation Lab Used: [facility] [place] Site: [place] INR POC 3.4 INR RANGE 2.0-3.0    Bleeding/hemorrhagic complications: no     Any changes in medication regimen? no     Any missed doses?: no       Is patient compliant with meds? yes       Allergies: 1)  ! Codeine 2)  ! Percocet 3)  ! Morphine 4)  ! Sulfa 5)  ! Persantine 6)  ! * Statins 7)  ! Iodine 8)  ! * Ivp Dye  Anticoagulation Management History:      The patient is taking warfarin and comes in today for a routine follow up visit.  Positive risk factors for bleeding include an age of 74 years or older.  The bleeding index is 'intermediate risk'.  Negative CHADS2 values include Age > 74 years old.  Anticoagulation responsible provider: [name].  INR POC: 3.4.  Cuvette Lot#: [id number].  Exp: 01/2011.    Anticoagulation Management Assessment/Plan:      The patient's current anticoagulation dose is Coumadin 4 mg tabs: Take as directed.  The target INR is 2.0-3.0.  The next INR is due 11/10/2009.  Anticoagulation instructions were given to patient.  Results were reviewed/authorized by Cloyde Reams, RN, BSN.  She was notified by Cloyde Reams RN.         Prior Anticoagulation Instructions: No coumadin for 3 days then coumadin 6 mg daily with 8 mg on Tues and Thurs  Current Anticoagulation Instructions: INR 3.4  Skip today's dosage of coumadin then start taking 1.5 tablets daily except 2 tablets on Thursdays.  Recheck in 3 weeks.

## 2010-07-07 NOTE — Progress Notes (Signed)
Summary: MEDICATIONS  Phone Note Call from Patient Call back at Home Phone [phone]   Caller: SELF Call For: [facility] Summary of Call: WOULD LIKE A CALL ABOUT HER MEDICATIONS Initial call taken by: Harlon Flor,  April 08, 2010 10:57 AM  Follow-up for Phone Call        was unsure if metoprolol and lopressor were the same medication. pt had picked up her new prescription. Follow-up by: Benedict Needy, RN,  April 08, 2010 11:01 AM

## 2010-07-07 NOTE — Assessment & Plan Note (Signed)
Summary: EC6/AMD   Primary Provider:  Dr. Burnett Sheng  CC:  ROV;Chronic SOB.  History of Present Illness: 76 yo with history of paroxysmal atrial fibrillation presents for followup of recent hospitalization with afib/rapid ventricular response.  Patient had been on sotalol for about the last 2 years but had had episodes of breakthrough atrial fibrillation on sotalol.  She was noted by her PCP at a routine appointment in 4/11 to be in atrial fib with RVR (in the setting of a UTI).  She was admitted to [facility].  She was started on amiodarone and underwent DCCV to NSR.  She was discharged home on amiodarone 200 mg two times a day.   Today in followup, she states that she has stopped her Niaspan, stop her Lasix, stop her potassium, stopped her amiodarone. She did not feel well overall and read the package inserts on all of her medications and decided that these were the ones that were causing her problems. She had multiple complaints, weakness, leg cramping monitors. Now she states that she feels better. Her breathing is about the same and she is short of breath with exertion.  PFTs were done by Dr. Meredeth Ide which suggested some restrictive and obstructive deficits. Please see the report for full details. It was suggested that her weight may be playing a role in her shortness of breath.  labs from October 01, 1998 shows total cholesterol 294, LDL 146, triglycerides 459, HDL 44. She states that she has muscle  problems on statins.  Problems Prior to Update: 1)  Hypertension, Benign  (ICD-401.1) 2)  Atrial Fibrillation  (ICD-427.31) 3)  Dyspnea  (ICD-786.05) 4)  Hyperlipidemia-mixed  (ICD-272.4) 5)  Encounter For Long-term Use of Other Medications  (ICD-V58.69)  Medications Prior to Update: 1)  Prevacid 30 Mg Cpdr (Lansoprazole) .... Take 1 By Mouth Two Times A Day 2)  Toprol Xl 50 Mg Xr24h-Tab (Metoprolol Succinate) .... Take 1 1/2 Tablets By Mouth Once Daily 3)  Synthroid 125 Mcg Tabs (Levothyroxine  Sodium) .... Take 1 By Mouth Once Daily 4)  Diovan 80 Mg Tabs (Valsartan) .... Take One Tablet By Mouth Two Times A Day 5)  Amiodarone Hcl 200 Mg Tabs (Amiodarone Hcl) .... Take One Tablet By Mouth Two Times A Day 6)  Coumadin 4 Mg Tabs (Warfarin Sodium) .... Take As Directed 7)  Cardizem Cd 240 Mg Xr24h-Cap (Diltiazem Hcl Coated Beads) .... Take 1 By Mouth Once Daily 8)  Symbicort 160-4.5 Mcg/act Aero (Budesonide-Formoterol Fumarate) .... Up To 2 Puffs Once Daily 9)  B Complex-B12  Tabs (B Complex Vitamins) .... Take 1 By Mouth Once Daily 10)  Fioricet 50-325-40 Mg Tabs (Butalbital-Apap-Caffeine) .... Take Up To 2 By Mouth Up To Four Times A Day As Needed 11)  Vitamin D .... Every Week 12)  Furosemide 20 Mg Tabs (Furosemide) .... Take One Tablet By Mouth Daily. 13)  Klor-Con M10 10 Meq Cr-Tabs (Potassium Chloride Crys Cr) .Marland Kitchen.. 1 Tab By Mouth Daily 14)  Lovaza 1 Gm Caps (Omega-3-Acid Ethyl Esters) .Marland Kitchen.. 1 Tab By Mouth Daily 15)  Niaspan 500 Mg Cr-Tabs (Niacin (Antihyperlipidemic)) .... Take 1 Tablet Daily X 1 Week, Then Increase To 2 Tablets Daily.  Current Medications (verified): 1)  Prevacid 30 Mg Cpdr (Lansoprazole) .... Take 1 By Mouth Two Times A Day 2)  Toprol Xl 50 Mg Xr24h-Tab (Metoprolol Succinate) .... Take 1 Tablet By Mouth Once Daily 3)  Synthroid 125 Mcg Tabs (Levothyroxine Sodium) .... Take 1 By Mouth Once Daily 4)  Diovan 80  Mg Tabs (Valsartan) .... Take One Tablet By Mouth Two Times A Day 5)  Amiodarone Hcl 200 Mg Tabs (Amiodarone Hcl) .... Take One Tablet By Mouth Two Times A Day (Stopped 10/15/09) 6)  Coumadin 4 Mg Tabs (Warfarin Sodium) .... Take As Directed 7)  Symbicort 160-4.5 Mcg/act Aero (Budesonide-Formoterol Fumarate) .... Up To 2 Puffs Once Daily 8)  B Complex-B12  Tabs (B Complex Vitamins) .... Take 1 By Mouth Once Daily 9)  Fioricet 50-325-40 Mg Tabs (Butalbital-Apap-Caffeine) .... Take Up To 2 By Mouth Up To Four Times A Day As Needed 10)  Vitamin D .... Every  Week 11)  Furosemide 20 Mg Tabs (Furosemide) .... Take One Tablet By Mouth Daily. (Stopped 10/15/09) 12)  Klor-Con M10 10 Meq Cr-Tabs (Potassium Chloride Crys Cr) .Marland Kitchen.. 1 Tab By Mouth Daily (Stopped 10/15/09) 13)  Niaspan 500 Mg Cr-Tabs (Niacin (Antihyperlipidemic)) .... Take 1 Tablet Daily X 1 Week, Then Increase To 2 Tablets Daily. (Stopped 10/15/09)  Allergies: 1)  ! Codeine 2)  ! Percocet 3)  ! Morphine 4)  ! Sulfa 5)  ! Persantine 6)  ! * Statins 7)  ! Iodine 8)  ! * Ivp Dye  Past History:  Past Medical History: Last updated: 09/19/2009 1. HTN 2. Hypothyroidism 3. Hyperlipidemia: has been unable to take statins due to muscle pain.  Has tried multiple statins per her report. ' 4. GERD 5. Migraines 6. OSA: not consistently using CPAP 7. CCY  8. Left heart cath x 3 in the past, last 5 years ago.  All "normal" per her report.   9. Contrast allergy 10. Echo (4/11): EF > 55%, mild LVH, moderate LAE, normal RV, normal PA systolic pressure 11. PAD: occlusive disease involving left PT and AT.  12. Paroxysmal atrial fibrillation: Breakthrough afib on sotalol, changed to amiodarone 4/11.  DCCV 4/11.   Family History: Last updated: 09/19/2009 Father with MI at age 22. Daughter with colon cancer. Mother with MI x 2.   Social History: Last updated: 09/19/2009 From [place], widowed, never smoked.  Lives alone.    Review of Systems       The patient complains of dyspnea on exertion.  The patient denies fever, weight loss, weight gain, vision loss, decreased hearing, hoarseness, chest pain, syncope, peripheral edema, prolonged cough, abdominal pain, incontinence, muscle weakness, depression, and enlarged lymph nodes.    Vital Signs:  Patient profile:   76 year old female Height:      62.5 inches Weight:      221 pounds BMI:     39.92 Pulse rate:   80 / minute BP sitting:   138 / 68  (left arm)  Vitals Entered By: Stanton Kidney, EMT-P (Oct 20, 2009 1:59 PM)  Physical  Exam  General:  elderly woman in no apparent distress, HEENT exam is benign, oropharynx is clear, neck is supple with no JVP or carotid bruits, heart sounds are regular with S1-S2 and no murmurs appreciated, lungs are clear to auscultation with no wheezes Rales, abdominal exam is benign, no significant lower extremity edema, neurologic exam is grossly nonfocal, skin is warm and dry. Pulses are equal and symmetrical in her upper and lower extremities.   Impression & Recommendations:  Problem # 1:  ATRIAL FIBRILLATION (ICD-427.31) she remains in sinus rhythm by auscultation today. She is on Toprol and has stopped her amiodarone as she states it did not make her feel well. We will change her Toprol back to sotalol 120 mg b.i.d. to maintain her  rhythm appeared  Her updated medication list for this problem includes:    Sotalol Hcl 120 Mg Tabs (Sotalol hcl) .Marland Kitchen... Take 1 tablet by mouth two times a day    Amiodarone Hcl 200 Mg Tabs (Amiodarone hcl) .Marland Kitchen... Take one tablet by mouth two times a day (stopped 10/15/09)    Coumadin 4 Mg Tabs (Warfarin sodium) .Marland Kitchen... Take as directed  Her updated medication list for this problem includes:    Sotalol Hcl 120 Mg Tabs (Sotalol hcl) .Marland Kitchen... Take 1 tablet by mouth two times a day    Amiodarone Hcl 200 Mg Tabs (Amiodarone hcl) .Marland Kitchen... Take one tablet by mouth two times a day (stopped 10/15/09)    Coumadin 4 Mg Tabs (Warfarin sodium) .Marland Kitchen... Take as directed  Problem # 2:  HYPERLIPIDEMIA-MIXED (ICD-272.4)  She has very elevated cholesterol. She has stopped her to laser, stop the Niaspan on her own. We will try her on Crestor 2.5 mg a day and have given her some samples.  The following medications were removed from the medication list:    Lovaza 1 Gm Caps (Omega-3-acid ethyl esters) .Marland Kitchen... 1 tab by mouth daily Her updated medication list for this problem includes:    Niaspan 500 Mg Cr-tabs (Niacin (antihyperlipidemic)) .Marland Kitchen... Take 1 tablet daily x 1 week, then increase to  2 tablets daily. (stopped 10/15/09)    Crestor 5 Mg Tabs (Rosuvastatin calcium) .Marland Kitchen... Take 1/2 tablet by mouth every day  The following medications were removed from the medication list:    Lovaza 1 Gm Caps (Omega-3-acid ethyl esters) .Marland Kitchen... 1 tab by mouth daily Her updated medication list for this problem includes:    Niaspan 500 Mg Cr-tabs (Niacin (antihyperlipidemic)) .Marland Kitchen... Take 1 tablet daily x 1 week, then increase to 2 tablets daily. (stopped 10/15/09)    Crestor 5 Mg Tabs (Rosuvastatin calcium) .Marland Kitchen... Take 1/2 tablet by mouth every day  Problem # 3:  DYSPNEA (ICD-786.05) She has to sit down exertion. PFTs suggested may be due to her obesity and being generally deconditioned. She takes Entocort which was recommended on the study. She does have some component of a obstructive  disease.  The following medications were removed from the medication list:    Cardizem Cd 240 Mg Xr24h-cap (Diltiazem hcl coated beads) .Marland Kitchen... Take 1 by mouth once daily Her updated medication list for this problem includes:    Sotalol Hcl 120 Mg Tabs (Sotalol hcl) .Marland Kitchen... Take 1 tablet by mouth two times a day    Losartan Potassium 100 Mg Tabs (Losartan potassium) .Marland Kitchen... Take 1 tablet by mouth once a day    Furosemide 20 Mg Tabs (Furosemide) .Marland Kitchen... Take one tablet by mouth daily. (stopped 10/15/09)  The following medications were removed from the medication list:    Cardizem Cd 240 Mg Xr24h-cap (Diltiazem hcl coated beads) .Marland Kitchen... Take 1 by mouth once daily Her updated medication list for this problem includes:    Sotalol Hcl 120 Mg Tabs (Sotalol hcl) .Marland Kitchen... Take 1 tablet by mouth two times a day    Losartan Potassium 100 Mg Tabs (Losartan potassium) .Marland Kitchen... Take 1 tablet by mouth once a day    Furosemide 20 Mg Tabs (Furosemide) .Marland Kitchen... Take one tablet by mouth daily. (stopped 10/15/09)  Problem # 4:  HYPERTENSION, BENIGN (ICD-401.1) Her blood pressure is well controlled on her current medication regimen. Her Diovan is  expensive and we will change this to losartan 100 mg daily.  The following medications were removed from the medication list:  Cardizem Cd 240 Mg Xr24h-cap (Diltiazem hcl coated beads) .Marland Kitchen... Take 1 by mouth once daily Her updated medication list for this problem includes:    Sotalol Hcl 120 Mg Tabs (Sotalol hcl) .Marland Kitchen... Take 1 tablet by mouth two times a day    Losartan Potassium 100 Mg Tabs (Losartan potassium) .Marland Kitchen... Take 1 tablet by mouth once a day    Furosemide 20 Mg Tabs (Furosemide) .Marland Kitchen... Take one tablet by mouth daily. (stopped 10/15/09)  Patient Instructions: 1)  Your physician recommends that you schedule a follow-up appointment in: 6 months with Dr Shirlee Latch or Dr Mariah Milling. 2)  Your physician has recommended you make the following change in your medication: Start taking Crestor 5mg  take 1/2 tablet every  day. Change your Diovan to Losartan 100mg  once daily. Stop Toprol XL and start taking Sotalol 120mg  two times a day. Prescriptions: COUMADIN 4 MG TABS (WARFARIN SODIUM) Take as directed  #60 x 3   Entered by:   Cloyde Reams RN   Authorized by:   [name] MD   Signed by:   Cloyde Reams RN on 10/20/2009   Method used:   Electronically to        [facility] [address] (retail)       [address], [address]       [place], [place]  [address]       Ph: [phone]       Fax: [phone]   RxID:   [id number] LOSARTAN POTASSIUM 100 MG TABS (LOSARTAN POTASSIUM) Take 1 tablet by mouth once a day  #30 x 6   Entered by:   Cloyde Reams RN   Authorized by:   [name] MD   Signed by:   Cloyde Reams RN on 10/20/2009   Method used:   Electronically to        [facility] [address] (retail)       [address], [address]       [place], [place]  [address]       Ph: [phone]       Fax: [phone]   RxID:   [id number]

## 2010-07-07 NOTE — Progress Notes (Signed)
Summary: ORAL SURGEON  Phone Note Call from Patient Call back at [facility] Phone [phone]   Caller: Patient Call For: SHARON Summary of Call: PATIENT WOULD LIKE TO KNOW THE RECCOMENDATION FOR AN ORAL SURGEON IN THE AREA. Initial call taken by: [place],  January 01, 2010 10:07 AM  Follow-up for Phone Call        oral surgeon Follow-up by: Bishop Dublin, CMA,  January 01, 2010 12:18 PM  Additional Follow-up for Phone Call Additional follow up Details #1::        LMOM  recommend Dr. Erskine Emery. Additional Follow-up by: Bishop Dublin, CMA,  January 01, 2010 12:19 PM

## 2010-07-07 NOTE — Assessment & Plan Note (Signed)
Summary: EKG/AMD  Nurse Visit   Vital Signs:  Patient profile:   76 year old female Height:      62.5 inches Weight:      223 pounds BMI:     40.28 Pulse rate:   92 / minute BP sitting:   100 / 64  (left arm) Cuff size:   large  Vitals Entered By: Bishop Dublin, CMA (April 10, 2010 1:59 PM)  Past History:  Past Medical History: Last updated: 09/19/2009 1. HTN 2. Hypothyroidism 3. Hyperlipidemia: has been unable to take statins due to muscle pain.  Has tried multiple statins per her report. ' 4. GERD 5. Migraines 6. OSA: not consistently using CPAP 7. CCY  8. Left heart cath x 3 in the past, last 5 years ago.  All "normal" per her report.   9. Contrast allergy 10. Echo (4/11): EF > 55%, mild LVH, moderate LAE, normal RV, normal PA systolic pressure 11. PAD: occlusive disease involving left PT and AT.  12. Paroxysmal atrial fibrillation: Breakthrough afib on sotalol, changed to amiodarone 4/11.  DCCV 4/11.   Family History: Last updated: 09/19/2009 Father with MI at age 83. Daughter with colon cancer. Mother with MI x 2.   Social History: Last updated: 09/19/2009 From [place], widowed, never smoked.  Lives alone.     Current Medications (verified): 1)  Prilosec 20 Mg Cpdr (Omeprazole) .... Take 1 Tablet By Mouth Two Times A Day 2)  Synthroid 125 Mcg Tabs (Levothyroxine Sodium) .... Take 1 By Mouth Once Daily 3)  Losartan Potassium 100 Mg Tabs (Losartan Potassium) .... Take 1 Tablet By Mouth Once A Day 4)  Coumadin 4 Mg Tabs (Warfarin Sodium) .... Take As Directed 5)  Symbicort 160-4.5 Mcg/act Aero (Budesonide-Formoterol Fumarate) .... Up To 2 Puffs Once Daily 6)  B Complex-B12  Tabs (B Complex Vitamins) .... Take 1 By Mouth Once Daily 7)  Fioricet 50-325-40 Mg Tabs (Butalbital-Apap-Caffeine) .... Take Up To 2 By Mouth Up To Four Times A Day As Needed 8)  Vitamin D .... Every Week 9)  Furosemide 20 Mg Tabs (Furosemide) .... Take One Tablet By Mouth Daily. (Stopped  10/15/09) 10)  Klor-Con M10 10 Meq Cr-Tabs (Potassium Chloride Crys Cr) .Marland Kitchen.. 1 Tab By Mouth Daily (Stopped 10/15/09) 11)  Niaspan 500 Mg Cr-Tabs (Niacin (Antihyperlipidemic)) .... Take 1 Tablet Daily X 1 Week, Then Increase To 2 Tablets Daily. (Stopped 10/15/09) 12)  Metoprolol Tartrate 50 Mg Tabs (Metoprolol Tartrate) .... Take 1 1/2  Tablets By Mouth Two Times A Day 13)  Amiodarone Hcl 200 Mg Tabs (Amiodarone Hcl) .... Take 1 Tablet By Mouth Two Times A Day 14)  Cardizem Cd 240 Mg Xr24h-Cap (Diltiazem Hcl Coated Beads) .... Take 1 Tablet By Mouth Once A Day  Allergies (verified): 1)  ! Codeine 2)  ! Percocet 3)  ! Morphine 4)  ! Sulfa 5)  ! Persantine 6)  ! * Statins 7)  ! Iodine 8)  ! * Ivp Dye  Visit Type:  Nurse visit  CC:  c/o shortness of breath with over exertion.Marland Kitchen

## 2010-07-07 NOTE — Medication Information (Signed)
Summary: Jacqueline Lozano  Anticoagulant Therapy  Managed by: Cloyde Reams, RN, BSN Referring MD: Mariah Milling PCP: Dr. Moses Manners MD: Shirlee Latch MD, [place] Indication 1: Atrial Fibrillation Lab Used: [facility] [place] Site: [place] INR POC 3.1 INR RANGE 2.0-3.0  Dietary changes: no    Health status changes: no    Bleeding/hemorrhagic complications: no    Recent/future hospitalizations: no    Any changes in medication regimen? no    Recent/future dental: no  Any missed doses?: no       Is patient compliant with meds? yes       Allergies: 1)  ! Codeine 2)  ! Percocet 3)  ! Morphine 4)  ! Sulfa 5)  ! Persantine 6)  ! * Statins 7)  ! Iodine 8)  ! * Ivp Dye  Anticoagulation Management History:      The patient is taking warfarin and comes in today for a routine follow up visit.  Positive risk factors for bleeding include an age of 74 years or older.  The bleeding index is 'intermediate risk'.  Positive CHADS2 values include History of HTN.  Negative CHADS2 values include Age > 40 years old.  Anticoagulation responsible provider: Shirlee Latch MD, [place].  INR POC: 3.1.  Cuvette Lot#: [id number].  Exp: 04/2011.    Anticoagulation Management Assessment/Plan:      The patient's current anticoagulation dose is Coumadin 4 mg tabs: Take as directed.  The target INR is 2.0-3.0.  The next INR is due 03/18/2010.  Anticoagulation instructions were given to patient.  Results were reviewed/authorized by Cloyde Reams, RN, BSN.  She was notified by Cloyde Reams RN.         Prior Anticoagulation Instructions: INR 2.2  Start taking 1.5 tablets daily.  Recheck in 1 week.    Current Anticoagulation Instructions: INR 3.1  Start taking 1.5 tablets daily except 1 tablet on Wednesdays and Saturdays.  Recheck in 2 weeks.

## 2010-07-07 NOTE — Assessment & Plan Note (Signed)
Summary: F6M/GLC   Visit Type:  Follow-up Primary Provider:  Dr. Burnett Sheng  CC:  c/o tremors and shortness of breath. Has some irreg. heart beats. Denies chest pain.Marland Kitchen  History of Present Illness: 76 yo with history of paroxysmal atrial fibrillation presents for followup of recent hospitalization with afib/rapid ventricular response.  Patient had been on sotalol for about the last 2 years but had had episodes of breakthrough atrial fibrillation on sotalol.  She was noted by her PCP at a routine appointment in 4/11 to be in atrial fib with RVR (in the setting of a UTI).  She was admitted to [facility].  She was started on amiodarone and underwent DCCV to NSR.  She was discharged home on amiodarone 200 mg two times a day. she was seen last month for rapid atrial fibrillation in the hospital and again went through a DC cardioversion with discharge on diltiazem, beta blockers, amiodarone. she was successfully holding sinus rhythm.  She presents today in atrial fibrillation with rates greater than 130 at rest, symptoms of mild shortness of breath. She reports that she has been taking her medications on a consistent basis though recently her amiodarone was decreased from 200 mg b.i.d. to daily. She does have obstructive sleep apnea but uses her CPAP on a consistent basis. she believes that one of the medications, Lopressor or amiodarone, is causing a significant tremor.  PFTs were done by Dr. Meredeth Ide which suggested some restrictive and obstructive deficits. Please see the report for full details. It was suggested that her weight may be playing a role in her shortness of breath.  labs from September 30, 2009 shows total cholesterol 294, LDL 146, triglycerides 459, HDL 44. She states that she has muscle  problems on statins.  Current Medications (verified): 1)  Prilosec 20 Mg Cpdr (Omeprazole) .... Take 1 Tablet By Mouth Two Times A Day 2)  Synthroid 125 Mcg Tabs (Levothyroxine Sodium) .... Take 1 By Mouth Once  Daily 3)  Losartan Potassium 100 Mg Tabs (Losartan Potassium) .... Take 1 Tablet By Mouth Once A Day 4)  Coumadin 4 Mg Tabs (Warfarin Sodium) .... Take As Directed 5)  Symbicort 160-4.5 Mcg/act Aero (Budesonide-Formoterol Fumarate) .... Up To 2 Puffs Once Daily 6)  B Complex-B12  Tabs (B Complex Vitamins) .... Take 1 By Mouth Once Daily 7)  Fioricet 50-325-40 Mg Tabs (Butalbital-Apap-Caffeine) .... Take Up To 2 By Mouth Up To Four Times A Day As Needed 8)  Vitamin D .... Every Week 9)  Furosemide 20 Mg Tabs (Furosemide) .... Take One Tablet By Mouth Daily. (Stopped 10/15/09) 10)  Klor-Con M10 10 Meq Cr-Tabs (Potassium Chloride Crys Cr) .Marland Kitchen.. 1 Tab By Mouth Daily (Stopped 10/15/09) 11)  Niaspan 500 Mg Cr-Tabs (Niacin (Antihyperlipidemic)) .... Take 1 Tablet Daily X 1 Week, Then Increase To 2 Tablets Daily. (Stopped 10/15/09) 12)  Metoprolol Tartrate 50 Mg Tabs (Metoprolol Tartrate) .... Take 1 1/2  Tablets By Mouth Two Times A Day 13)  Amiodarone Hcl 200 Mg Tabs (Amiodarone Hcl) .... Take 1 Tablet By Mouth Daily 14)  Cardizem Cd 240 Mg Xr24h-Cap (Diltiazem Hcl Coated Beads) .... Take 1 Tablet By Mouth Once A Day  Allergies (verified): 1)  ! Codeine 2)  ! Percocet 3)  ! Morphine 4)  ! Sulfa 5)  ! Persantine 6)  ! * Statins 7)  ! Iodine 8)  ! * Ivp Dye  Past History:  Past Medical History: Last updated: 09/19/2009 1. HTN 2. Hypothyroidism 3. Hyperlipidemia: has been unable  to take statins due to muscle pain.  Has tried multiple statins per her report. ' 4. GERD 5. Migraines 6. OSA: not consistently using CPAP 7. CCY  8. Left heart cath x 3 in the past, last 5 years ago.  All "normal" per her report.   9. Contrast allergy 10. Echo (4/11): EF > 55%, mild LVH, moderate LAE, normal RV, normal PA systolic pressure 11. PAD: occlusive disease involving left PT and AT.  12. Paroxysmal atrial fibrillation: Breakthrough afib on sotalol, changed to amiodarone 4/11.  DCCV 4/11.   Family  History: Last updated: 09/19/2009 Father with MI at age 21. Daughter with colon cancer. Mother with MI x 2.   Social History: Last updated: 09/19/2009 From [place], widowed, never smoked.  Lives alone.    Review of Systems       The patient complains of dyspnea on exertion.  The patient denies fever, weight loss, weight gain, vision loss, decreased hearing, hoarseness, chest pain, syncope, peripheral edema, prolonged cough, abdominal pain, incontinence, muscle weakness, depression, and enlarged lymph nodes.         palpitations  Vital Signs:  Patient profile:   76 year old female Height:      62.5 inches Weight:      221 pounds BMI:     39.92 Pulse rate:   132 / minute BP sitting:   130 / 78  (left arm) Cuff size:   large  Vitals Entered By: Bishop Dublin, CMA (April 07, 2010 2:16 PM)  Physical Exam  General:  Well developed, well nourished, in no acute distress. Head:  normocephalic and atraumatic Neck:  Neck supple, no JVD. No masses, thyromegaly or abnormal cervical nodes. Chest Wall:  no deformities or breast masses noted Lungs:  Clear bilaterally to auscultation and percussion. Heart:  Non-displaced PMI, chest non-tender;irregular rate and rhythm, S1, S2 without rubs or gallops. 2/6 early systolic murmur RUSB. Carotid upstroke normal, no bruit.1+ pedal pulses b/l.  No edema, no varicosities. Abdomen:  Bowel sounds positive; abdomen soft and non-tender without masses, obese Msk:  Back normal, normal gait. Muscle strength and tone normal. Extremities:  No clubbing or cyanosis. Neurologic:  Alert and oriented x 3. Skin:  Intact without lesions or rashes. Psych:  Normal affect.   Impression & Recommendations:  Problem # 1:  ATRIAL FIBRILLATION (ICD-427.31) very challenging to manage atrial fibrillation As typically when she is in atrial fibrillation, her heart rate is very hard to slow. She has had several cardioversions and converted to atrial fibrillation shortly  after. She has tried sotalol, diltiazem, metoprolol and amiodarone. currently she takes the latter 3 medications.  She's not interested in a repeat cardioversion at this present time and we will try to pharmacologically convert her back to sinus rhythm with the loading of her amiodarone using 400 mg b.i.d. for the next several days with EKG in 3 days' time.  if she continues to be in atrial fibrillation with a poor control rate, we will have her see Dr. Graciela Husbands.  Is uncertain if she would benefit from flecainide. Alternatively, she may be a candidate for ablation as her rate in nature fibrillation is very challenging to slow.  Her updated medication list for this problem includes:    Coumadin 4 Mg Tabs (Warfarin sodium) .Marland Kitchen... Take as directed    Metoprolol Tartrate 50 Mg Tabs (Metoprolol tartrate) .Marland Kitchen... Take 1 1/2  tablets by mouth two times a day    Amiodarone Hcl 200 Mg Tabs (Amiodarone hcl) .Marland KitchenMarland KitchenMarland KitchenMarland Kitchen  Take 1 tablet by mouth two times a day  Orders: EKG w/ Interpretation (93000)  Problem # 2:  HYPERTENSION, BENIGN (ICD-401.1) blood pressure is reasonably well controlled on her current medication regimen.  Her updated medication list for this problem includes:    Losartan Potassium 100 Mg Tabs (Losartan potassium) .Marland Kitchen... Take 1 tablet by mouth once a day    Furosemide 20 Mg Tabs (Furosemide) .Marland Kitchen... Take one tablet by mouth daily. (stopped 10/15/09)    Metoprolol Tartrate 50 Mg Tabs (Metoprolol tartrate) .Marland Kitchen... Take 1 1/2  tablets by mouth two times a day    Cardizem Cd 240 Mg Xr24h-cap (Diltiazem hcl coated beads) .Marland Kitchen... Take 1 tablet by mouth once a day  Problem # 3:  DYSPNEA (ICD-786.05) Her shortness of breath is due to her atrial fibrillation with RVR. Surprisingly she is only very mildly symptomatic. She does not appear to need any significant diuresis at this time as clinically on exam she does not appear to be fluid overloaded.  Her updated medication list for this problem includes:     Losartan Potassium 100 Mg Tabs (Losartan potassium) .Marland Kitchen... Take 1 tablet by mouth once a day    Furosemide 20 Mg Tabs (Furosemide) .Marland Kitchen... Take one tablet by mouth daily. (stopped 10/15/09)    Metoprolol Tartrate 50 Mg Tabs (Metoprolol tartrate) .Marland Kitchen... Take 1 1/2  tablets by mouth two times a day    Cardizem Cd 240 Mg Xr24h-cap (Diltiazem hcl coated beads) .Marland Kitchen... Take 1 tablet by mouth once a day  Problem # 4:  HYPERLIPIDEMIA-MIXED (ICD-272.4) Cholesterol very elevated and she has a difficult time tolerating most medications. Sometimes, she takes herself off her medications without telling us, such as previously stopping her Lasix. We will not start her on a statin at this time as she has a history of intolerance.  Her updated medication list for this problem includes:    Niaspan 500 Mg Cr-tabs (Niacin (antihyperlipidemic)) .Marland Kitchen... Take 1 tablet daily x 1 week, then increase to 2 tablets daily. (stopped 10/15/09)  Patient Instructions: 1)  Your physician recommends that you schedule a follow-up appointment in: EKG on Friday  and with Dr. Johney Frame next available 2)  Your physician has recommended you make the following change in your medication: INCREASE amiodarone 2 tabs twice a day until friday after friday go back to taking 1 tab twice day Prescriptions: AMIODARONE HCL 200 MG TABS (AMIODARONE HCL) Take 1 tablet by mouth two times a day  #60 x 6   Entered by:   Benedict Needy, RN   Authorized by:   [name] MD   Signed by:   Benedict Needy, RN on 04/07/2010   Method used:   Electronically to        [facility] [address] (retail)       [address], [address]       [place], [place]  [address]       Ph: [phone]       Fax: [phone]   RxID:   [id number] COUMADIN 4 MG TABS (WARFARIN SODIUM) Take as directed  #50 x 2   Entered by:   Benedict Needy, RN   Authorized by:   [name] MD   Signed by:   Benedict Needy, RN on 04/07/2010   Method used:    Electronically to        [facility] [address] (retail)       [address], [address]       [place], [place]  [address]       Ph: [phone]       Fax: [phone]   RxID:   [id number] METOPROLOL TARTRATE 50 MG TABS (METOPROLOL TARTRATE) Take 1 1/2  tablets by mouth two times a day  #90 x 6   Entered by:   Benedict Needy, RN   Authorized by:   [name] MD   Signed by:   Benedict Needy, RN on 04/07/2010   Method used:   Electronically to        [facility] [address] (retail)       [address], [address]       [place], [place]  [address]       Ph: [phone]       Fax: [phone]   RxID:   [id number] AMIODARONE HCL 200 MG TABS (AMIODARONE HCL) Take 1 tablet by mouth daily  #60 x 6   Entered by:   Benedict Needy, RN   Authorized by:   [name] MD   Signed by:   Benedict Needy, RN on 04/07/2010   Method used:   Electronically to        [facility] [address] (retail)       [address], [address]       [place], [place]  [address]       Ph: [phone]       Fax: [phone]   RxID:   [id number]

## 2010-07-07 NOTE — Medication Information (Signed)
Summary: CCR/GLC  Anticoagulant Therapy  Managed by: Cloyde Reams, RN, BSN Referring MD: Mariah Milling PCP: Dr. Moses Manners MD: Mariah Milling Indication 1: Atrial Fibrillation Lab Used: [facility] [place] Site: [place] INR POC 2.1 INR RANGE 2.0-3.0  Dietary changes: no    Health status changes: no    Bleeding/hemorrhagic complications: no    Recent/future hospitalizations: no    Any changes in medication regimen? no    Recent/future dental: no  Any missed doses?: no       Is patient compliant with meds? yes       Allergies: 1)  ! Codeine 2)  ! Percocet 3)  ! Morphine 4)  ! Sulfa 5)  ! Persantine 6)  ! * Statins 7)  ! Iodine 8)  ! * Ivp Dye  Anticoagulation Management History:      The patient is taking warfarin and comes in today for a routine follow up visit.  Positive risk factors for bleeding include an age of 2 years or older.  The bleeding index is 'intermediate risk'.  Positive CHADS2 values include History of HTN.  Negative CHADS2 values include Age > 61 years old.  Anticoagulation responsible provider: Terriana Barreras.  INR POC: 2.1.  Exp: 01/2011.    Anticoagulation Management Assessment/Plan:      The patient's current anticoagulation dose is Coumadin 4 mg tabs: Take as directed.  The target INR is 2.0-3.0.  The next INR is due 12/08/2009.  Anticoagulation instructions were given to patient.  Results were reviewed/authorized by Cloyde Reams, RN, BSN.  She was notified by Benedict Needy, RN.         Prior Anticoagulation Instructions: INR 3.4  Skip today's dosage of coumadin then start taking 1.5 tablets daily except 2 tablets on Thursdays.  Recheck in 3 weeks.    Current Anticoagulation Instructions: INR 2.1  Continue on same dose of 1.5 tablet daily except 2 tablets on Thursday.   Recheck in 4 weeks

## 2010-07-07 NOTE — Medication Information (Signed)
Summary: Jacqueline Lozano  Anticoagulant Therapy  Managed by: Cloyde Reams, RN, BSN Referring MD: Mariah Milling PCP: Dr. Moses Manners MD: Graciela Husbands MD, Viviann Spare Indication 1: Atrial Fibrillation Lab Used: [facility] [place] Site: [place] INR RANGE 2.0-3.0     Recent/future hospitalizations: yes       Details: [facility] 9/12-9/15 DCCV-NSR  Any changes in medication regimen? yes       Details: Started on Amiodarone, Lopressor bid, Cardizem.      Allergies: 1)  ! Codeine 2)  ! Percocet 3)  ! Morphine 4)  ! Sulfa 5)  ! Persantine 6)  ! * Statins 7)  ! Iodine 8)  ! * Ivp Dye  Anticoagulation Management History:      The patient is taking warfarin and comes in today for a routine follow up visit.  Positive risk factors for bleeding include an age of 69 years or older.  The bleeding index is 'intermediate risk'.  Positive CHADS2 values include History of HTN.  Negative CHADS2 values include Age > 65 years old.  Anticoagulation responsible provider: Graciela Husbands MD, Viviann Spare.  Cuvette Lot#: [id number].  Exp: 03/2011.    Anticoagulation Management Assessment/Plan:      The patient's current anticoagulation dose is Coumadin 4 mg tabs: Take as directed.  The target INR is 2.0-3.0.  The next INR is due 03/04/2010.  Anticoagulation instructions were given to patient.  Results were reviewed/authorized by Cloyde Reams, RN, BSN.  She was notified by Cloyde Reams RN.         Prior Anticoagulation Instructions: INR 2.2  Continue on same dosage 6mg  daily except 8mg  on Thursdays.  Recheck in 4 weeks.    Current Anticoagulation Instructions: INR 2.2  Start taking 1.5 tablets daily.  Recheck in 1 week.    Appended Document: Jacqueline Lozano    Clinical Lists Changes  Medications: Changed medication from AMIODARONE HCL 200 MG TABS (AMIODARONE HCL) Take 1 tablet by mouth two times a day to AMIODARONE HCL 200 MG TABS (AMIODARONE HCL) Take 1 tablet by mouth daily Changed medication from METOPROLOL  TARTRATE 50 MG TABS (METOPROLOL TARTRATE) Take 1/2-1 tablet by mouth as needed for fast heart rate to METOPROLOL TARTRATE 50 MG TABS (METOPROLOL TARTRATE) Take 1 1/2  tablets by mouth two times a day

## 2010-07-07 NOTE — Medication Information (Signed)
Summary: rov/ewj  Anticoagulant Therapy  Managed by: Cloyde Reams, RN, BSN Referring MD: Mariah Milling PCP: Dr. Moses Manners MD: Mariah Milling Indication 1: Atrial Fibrillation Lab Used: [facility] [place] Site: [place] INR POC 3.5 INR RANGE 2.0-3.0  Dietary changes: no    Health status changes: yes       Details: [employer], pt thinks maybe related to medications.   Bleeding/hemorrhagic complications: no    Recent/future hospitalizations: no    Any changes in medication regimen? no    Recent/future dental: no  Any missed doses?: no       Is patient compliant with meds? yes       Allergies: 1)  ! Codeine 2)  ! Percocet 3)  ! Morphine 4)  ! Sulfa 5)  ! Persantine 6)  ! * Statins 7)  ! Iodine 8)  ! * Ivp Dye  Anticoagulation Management History:      The patient is taking warfarin and comes in today for a routine follow up visit.  Positive risk factors for bleeding include an age of 52 years or older.  The bleeding index is 'intermediate risk'.  Positive CHADS2 values include History of HTN.  Negative CHADS2 values include Age > 7 years old.  Anticoagulation responsible provider: Suzzette Gasparro.  INR POC: 3.5.  Cuvette Lot#: [id number].  Exp: 04/2011.    Anticoagulation Management Assessment/Plan:      The patient's current anticoagulation dose is Coumadin 4 mg tabs: Take as directed.  The target INR is 2.0-3.0.  The next INR is due 04/15/2010.  Anticoagulation instructions were given to patient.  Results were reviewed/authorized by Cloyde Reams, RN, BSN.  She was notified by Cloyde Reams RN.         Prior Anticoagulation Instructions: INR 3.3  Skip today's dosage of Coumadin, then resume 1.5 tablets daily except 1 tablet on Wednesdays and Saturdays.  Recheck in 2 weeks.    Current Anticoagulation Instructions: INR 3.5  Skip today's dosage of Coumadin, then start taking 1 tablet daily except 1.5 tablets on Sundays, Tuesdays, and Thursdays.  Recheck in 2  weeks.

## 2010-07-07 NOTE — Letter (Signed)
[facility]   Imported By: Harlon Flor 02/23/2010 16:22:50  _____________________________________________________________________  External Attachment:    Type:   Image     Comment:   External Document

## 2010-07-07 NOTE — Progress Notes (Signed)
Summary: MEDICATIONS  Phone Note Call from Patient Call back at Home Phone [phone]   Caller: SELF Call For: [facility] Summary of Call: PT STATES THAT ERIKA CHANGED HER DOSAGE FOR AMIODERONE AND LOPRESSOR YESTERDAY-DOES NOT REMEMBER THE NEW DOSAGE Initial call taken by: Harlon Flor,  March 05, 2010 9:32 AM  Follow-up for Phone Call        Went over medication list with pt. She is to take amio 200mg  daily and metoprolol 50mg  1 1/2 tabs two times a day. Benedict Needy, RN  March 05, 2010 9:43 AM

## 2010-07-07 NOTE — Progress Notes (Signed)
Summary: ED  Phone Note Call from Patient Call back at Home Phone [phone]   Caller: Patient Call For: Kendan Cornforth Summary of Call: PT STATES THAT SHE IS FEELING THE SAME WAY SHE FELT LAST NIGHT-STATES THAT SHE DOES NOT WANT TO GO TO THE ED AND WANTED TO COME IN THIS AFTERNOON TO BE SEEN-I TOLD THE PT THAT BECAUSE SHE WAS ALREADY ADVISED TO GO TO THE ED, THAT WE WOULD NOT BE ABLE TO SEE HER AND THAT SHE NEEDED TO GO TO THE ED. Initial call taken by: Harlon Flor,  January 22, 2010 1:41 PM  Follow-up for Phone Call        pt coming to office for EKG Benedict Needy, RN  January 22, 2010 2:38 PM

## 2010-07-07 NOTE — Progress Notes (Signed)
Summary: PFT  Phone Note Call from Patient Call back at Home Phone [phone]   Caller: SELF Call For: [facility] Summary of Call: PT WANTS TO KNOW IF SHE CAN HAVE A POP TART TOMORROW BEFORE THE PFT Initial call taken by: Harlon Flor,  September 22, 2009 9:49 AM  Follow-up for Phone Call        pt notified. Follow-up by: Charlena Cross, RN, BSN,  September 22, 2009 10:13 AM

## 2010-07-07 NOTE — Medication Information (Signed)
Summary: [facility]  Anticoagulant Therapy  Managed by: Sherri Rad, RN, BSN Referring MD: Mariah Milling PCP: Dr. Moses Manners MD: Mariah Milling Indication 1: Atrial Fibrillation Lab Used: [facility] [place] Site: [place] INR POC 2.1 INR RANGE 2.0-3.0  Dietary changes: no    Health status changes: no    Bleeding/hemorrhagic complications: no    Recent/future hospitalizations: no    Any changes in medication regimen? no    Recent/future dental: no  Any missed doses?: no       Is patient compliant with meds? yes       Allergies: 1)  ! Codeine 2)  ! Percocet 3)  ! Morphine 4)  ! Sulfa 5)  ! Persantine 6)  ! * Statins 7)  ! Iodine 8)  ! * Ivp Dye  Anticoagulation Management History:      The patient is taking warfarin and comes in today for a routine follow up visit.  Positive risk factors for bleeding include an age of 74 years or older.  The bleeding index is 'intermediate risk'.  Positive CHADS2 values include History of HTN.  Negative CHADS2 values include Age > 11 years old.  Anticoagulation responsible provider: [facility]  INR POC: 2.1.  Exp: 02/2011.    Anticoagulation Management Assessment/Plan:      The patient's current anticoagulation dose is Coumadin 4 mg tabs: Take as directed.  The target INR is 2.0-3.0.  The next INR is due 01/09/2010.  Anticoagulation instructions were given to patient.  Results were reviewed/authorized by Sherri Rad, RN, BSN.  She was notified by Sherri Rad, RN, BSN.         Prior Anticoagulation Instructions: INR 1.5  Take 2 tablets Tuesday and  Wednesday.  Then continue taking 1.5 tabs daily except 2 tabs on Thursday. Recheck in 10 days.   Current Anticoagulation Instructions: INR- 2.1  Continue current dose. Recheck 3 weeks.

## 2010-07-07 NOTE — Medication Information (Signed)
Summary: [facility]  Anticoagulant Therapy  Managed by: Cloyde Reams, RN, BSN Referring MD: Mariah Milling PCP: Dr. Moses Manners MD: Mariah Milling Indication 1: Atrial Fibrillation Lab Used: [facility] [place] Site:  INR POC 3.3 INR RANGE 2.0-3.0  Dietary changes: no    Health status changes: no    Bleeding/hemorrhagic complications: no    Recent/future hospitalizations: no    Any changes in medication regimen? yes       Details: Pt saw Dr Graciela Husbands today in office, d/c Amiodarone.   Recent/future dental: no  Any missed doses?: no       Is patient compliant with meds? yes       Allergies: 1)  ! Codeine 2)  ! Percocet 3)  ! Morphine 4)  ! Sulfa 5)  ! Persantine 6)  ! * Statins 7)  ! Iodine 8)  ! * Ivp Dye  Anticoagulation Management History:      The patient is taking warfarin and comes in today for a routine follow up visit.  Positive risk factors for bleeding include an age of 74 years or older.  The bleeding index is 'intermediate risk'.  Positive CHADS2 values include History of HTN.  Negative CHADS2 values include Age > 18 years old.  Anticoagulation responsible provider: [name].  INR POC: 3.3.  Cuvette Lot#: [id number].  Exp: 04/2011.    Anticoagulation Management Assessment/Plan:      The patient's current anticoagulation dose is Coumadin 4 mg tabs: Take as directed.  The target INR is 2.0-3.0.  The next INR is due 04/22/2010.  Anticoagulation instructions were given to patient.  Results were reviewed/authorized by Cloyde Reams, RN, BSN.  She was notified by Cloyde Reams RN.         Prior Anticoagulation Instructions: INR 3.5  Skip today's dosage of Coumadin, then start taking 1 tablet daily except 1.5 tablets on Sundays, Tuesdays, and Thursdays.  Recheck in 2 weeks.    Current Anticoagulation Instructions: INR 3.3  Hold today's dosage of Coumadin then resume same dosage 1 tablet daily except 1.5 tablets on Sundays, Tuesdays, and  Thursdays.   Recheck in 10 days.

## 2010-07-07 NOTE — Progress Notes (Signed)
Summary: PHI  PHI   Imported By: Harlon Flor 09/19/2009 14:52:27  _____________________________________________________________________  External Attachment:    Type:   Image     Comment:   External Document

## 2010-07-07 NOTE — Medication Information (Signed)
Summary: rov/ewj  Anticoagulant Therapy  Managed by: Cloyde Reams, RN, BSN Referring MD: Mariah Milling PCP: Dr. Moses Manners MD: Shirlee Latch MD, [place] Indication 1: Atrial Fibrillation Lab Used: [facility] [place] Site: [place] INR POC 2.2 INR RANGE 2.0-3.0  Dietary changes: no    Health status changes: no    Bleeding/hemorrhagic complications: no    Recent/future hospitalizations: no    Any changes in medication regimen? no    Recent/future dental: no  Any missed doses?: no       Is patient compliant with meds? yes       Allergies: 1)  ! Codeine 2)  ! Percocet 3)  ! Morphine 4)  ! Sulfa 5)  ! Persantine 6)  ! * Statins 7)  ! Iodine 8)  ! * Ivp Dye  Anticoagulation Management History:      The patient is taking warfarin and comes in today for a routine follow up visit.  Positive risk factors for bleeding include an age of 74 years or older.  The bleeding index is 'intermediate risk'.  Positive CHADS2 values include History of HTN.  Negative CHADS2 values include Age > 74 years old.  Anticoagulation responsible provider: Shirlee Latch MD, [place].  INR POC: 2.2.  Cuvette Lot#: [id number].  Exp: 03/2011.    Anticoagulation Management Assessment/Plan:      The patient's current anticoagulation dose is Coumadin 4 mg tabs: Take as directed.  The target INR is 2.0-3.0.  The next INR is due 03/04/2010.  Anticoagulation instructions were given to patient.  Results were reviewed/authorized by Cloyde Reams, RN, BSN.  She was notified by Cloyde Reams RN.         Prior Anticoagulation Instructions: INR 2.0  Continue on same dosage 6mg  daily except 8mg  on Thursdays.  Recheck in 4 weeks.    Current Anticoagulation Instructions: INR 2.2  Continue on same dosage 6mg  daily except 8mg  on Thursdays.  Recheck in 4 weeks.

## 2010-07-07 NOTE — Medication Information (Signed)
Summary: rov/tm  Anticoagulant Therapy  Managed by: Bethena Midget, RN, BSN Referring MD: Mariah Milling PCP: Dr. Moses Manners MD: Mariah Milling Indication 1: Atrial Fibrillation Lab Used: [facility] [place] Site: [place] INR POC 1.8 INR RANGE 2.0-3.0  Dietary changes: no    Health status changes: no    Bleeding/hemorrhagic complications: no    Recent/future hospitalizations: no    Any changes in medication regimen? no    Recent/future dental: no  Any missed doses?: no       Is patient compliant with meds? yes       Allergies: 1)  ! Codeine 2)  ! Percocet 3)  ! Morphine 4)  ! Sulfa 5)  ! Persantine 6)  ! * Statins 7)  ! Iodine 8)  ! * Ivp Dye  Anticoagulation Management History:      The patient is taking warfarin and comes in today for a routine follow up visit.  Positive risk factors for bleeding include an age of 74 years or older.  The bleeding index is 'intermediate risk'.  Positive CHADS2 values include History of HTN.  Negative CHADS2 values include Age > 58 years old.  Anticoagulation responsible provider: [facility].  INR POC: 1.8.  Cuvette Lot#: [id number].  Exp: 05/2011.    Anticoagulation Management Assessment/Plan:      The patient's current anticoagulation dose is Coumadin 4 mg tabs: Take as directed.  The target INR is 2.0-3.0.  The next INR is due 05/20/2010.  Anticoagulation instructions were given to patient.  Results were reviewed/authorized by Bethena Midget, RN, BSN.  She was notified by Bethena Midget, RN, BSN.         Prior Anticoagulation Instructions: INR 1.9 Change dose to 6mg s everyday except 4mg s on Mondays, Wednesdays, and Friday. Recheck in 2 weeks.   Current Anticoagulation Instructions: INR 1.8 Today take 1.5 pills then change dose to 1.5 pills everyday except 1 pill on Mondays and Fridays. Recheck in 2 weeks.

## 2010-07-07 NOTE — Medication Information (Signed)
Summary: [facility]  Anticoagulant Therapy  Managed by: Cloyde Reams, RN, BSN Referring MD: Mariah Milling PCP: Dr. Moses Manners MD: Mariah Milling Indication 1: Atrial Fibrillation Lab Used: [facility] [place] Site: [place] INR POC 2.0 INR RANGE 2.0-3.0  Dietary changes: no    Health status changes: no    Bleeding/hemorrhagic complications: no    Recent/future hospitalizations: no    Any changes in medication regimen? no    Recent/future dental: no  Any missed doses?: no       Is patient compliant with meds? yes       Allergies: 1)  ! Codeine 2)  ! Percocet 3)  ! Morphine 4)  ! Sulfa 5)  ! Persantine 6)  ! * Statins 7)  ! Iodine 8)  ! * Ivp Dye  Anticoagulation Management History:      The patient is taking warfarin and comes in today for a routine follow up visit.  Positive risk factors for bleeding include an age of 74 years or older.  The bleeding index is 'intermediate risk'.  Positive CHADS2 values include History of HTN.  Negative CHADS2 values include Age > 20 years old.  Anticoagulation responsible provider: [name].  INR POC: 2.0.  Cuvette Lot#: [id number].  Exp: 03/2011.    Anticoagulation Management Assessment/Plan:      The patient's current anticoagulation dose is Coumadin 4 mg tabs: Take as directed.  The target INR is 2.0-3.0.  The next INR is due 02/04/2010.  Anticoagulation instructions were given to patient.  Results were reviewed/authorized by Cloyde Reams, RN, BSN.  She was notified by Cloyde Reams RN.         Prior Anticoagulation Instructions: INR- 2.1  Continue current dose. Recheck 3 weeks.  Current Anticoagulation Instructions: INR 2.0  Continue on same dosage 6mg  daily except 8mg  on Thursdays.  Recheck in 4 weeks.

## 2010-07-07 NOTE — Assessment & Plan Note (Signed)
Summary: 8:30 APPT/AMD   Visit Type:  Initial Consult Primary Jaycelyn Orrison:  Dr. Burnett Sheng  CC:  c/o shortness of breath. Denies any recent spells of rapid heart beats..  History of Present Illness: 76 yo with history of paroxysmal atrial fibrillation presents for followup of recent hospitalization with afib/rapid ventricular response.  Patient had been on sotalol for about the last 2 years but had had episodes of breakthrough atrial fibrillation on sotalol.  She was noted by her PCP at a routine appointment in 4/11 to be in atrial fib with RVR (in the setting of a UTI).  She was admitted to [facility].  She was started on amiodarone and underwent DCCV to NSR.  She was discharged home on amiodarone 200 mg two times a day. she was seen last month for rapid atrial fibrillation in the hospital and again went through a DC cardioversion with discharge on diltiazem, beta blockers, amiodarone. she was successfully holding sinus rhythm.  She presents today in atrial fibrillation with rates greater than 130 at rest, symptoms of mild shortness of breath. She reports that she has been taking her medications on a consistent basis though recently her amiodarone was decreased from 200 mg b.i.d. to daily. She does have obstructive sleep apnea but uses her CPAP on a consistent basis. she believes that one of the medications, Lopressor or amiodarone, is causing a significant tremor.  PFTs were done by Dr. Meredeth Ide which suggested some restrictive and obstructive deficits. Please see the report for full details. It was suggested that her weight may be playing a role in her shortness of breath.  labs from September 30, 2009 shows total cholesterol 294, LDL 146, triglycerides 459, HDL 44. She states that she has muscle  problems on statins.  Current Medications (verified): 1)  Prilosec 20 Mg Cpdr (Omeprazole) .... Take 1 Tablet By Mouth Two Times A Day 2)  Synthroid 125 Mcg Tabs (Levothyroxine Sodium) .... Take 1 By Mouth Once  Daily 3)  Losartan Potassium 100 Mg Tabs (Losartan Potassium) .... Take 1 Tablet By Mouth Once A Day 4)  Coumadin 4 Mg Tabs (Warfarin Sodium) .... Take As Directed 5)  Symbicort 160-4.5 Mcg/act Aero (Budesonide-Formoterol Fumarate) .... Up To 2 Puffs Once Daily 6)  B Complex-B12  Tabs (B Complex Vitamins) .... Take 1 By Mouth Once Daily 7)  Fioricet 50-325-40 Mg Tabs (Butalbital-Apap-Caffeine) .... Take Up To 2 By Mouth Up To Four Times A Day As Needed 8)  Vitamin D .... Every Week 9)  Furosemide 20 Mg Tabs (Furosemide) .... Take One Tablet By Mouth Daily. (Stopped 10/15/09) 10)  Klor-Con M10 10 Meq Cr-Tabs (Potassium Chloride Crys Cr) .Marland Kitchen.. 1 Tab By Mouth Daily (Stopped 10/15/09) 11)  Niaspan 500 Mg Cr-Tabs (Niacin (Antihyperlipidemic)) .... Take 1 Tablet Daily X 1 Week, Then Increase To 2 Tablets Daily. (Stopped 10/15/09) 12)  Metoprolol Tartrate 50 Mg Tabs (Metoprolol Tartrate) .... Take 1 1/2  Tablets By Mouth Two Times A Day 13)  Amiodarone Hcl 200 Mg Tabs (Amiodarone Hcl) .... Take 1 Tablet By Mouth Two Times A Day 14)  Cardizem Cd 240 Mg Xr24h-Cap (Diltiazem Hcl Coated Beads) .... Take 1 Tablet By Mouth Once A Day  Allergies (verified): 1)  ! Codeine 2)  ! Percocet 3)  ! Morphine 4)  ! Sulfa 5)  ! Persantine 6)  ! * Statins 7)  ! Iodine 8)  ! * Ivp Dye  Past History:  Past Medical History: Last updated: 09/19/2009 1. HTN 2. Hypothyroidism 3. Hyperlipidemia:  has been unable to take statins due to muscle pain.  Has tried multiple statins per her report. ' 4. GERD 5. Migraines 6. OSA: not consistently using CPAP 7. CCY  8. Left heart cath x 3 in the past, last 5 years ago.  All "normal" per her report.   9. Contrast allergy 10. Echo (4/11): EF > 55%, mild LVH, moderate LAE, normal RV, normal PA systolic pressure 11. PAD: occlusive disease involving left PT and AT.  12. Paroxysmal atrial fibrillation: Breakthrough afib on sotalol, changed to amiodarone 4/11.  DCCV 4/11.    Family History: Last updated: 09/19/2009 Father with MI at age 52. Daughter with colon cancer. Mother with MI x 2.   Social History: Last updated: 09/19/2009 From [place], widowed, never smoked.  Lives alone.    Vital Signs:  Patient profile:   76 year old female Height:      62.5 inches Weight:      223 pounds BMI:     40.28 Pulse rate:   96 / minute BP sitting:   110 / 64  (left arm) Cuff size:   large  Vitals Entered By: Bishop Dublin, CMA (April 13, 2010 8:33 AM)  Physical Exam  General:  The patient was alert and oriented elderly female appearing her stated age in no acute distress. HEENT is normal apart from poor dentition. Neck veins were flat, carotids were brisk.back without scoliosis Lungs were clear. Heart sounds were irregular without murmurs or gallops. Abdomen was soft , protuberantwith active bowel sounds. There is no clubbing cyanosis or edema. skin is warm and dry neurological exam is grossly normal; affect is engaging    Impression & Recommendations:  Problem # 1:  ATRIAL FIBRILLATION (ICD-427.31) the patient has persistent atrial fibrillation and has failed amiodarone despite high doses and repeated cardioversion. Her left atrial dimension by echo in April was 4.8 cm. The this makes flecainide unlikely to be effective. Alternatives would then include dofetilide which would likely be tolerated from a QT interval point of view. It is she did however would require amiodarone level to be less than 0.3. We will measure that today.  We had a lengthy discussion regarding rate versus rhythm control. I think for right now trying rate control makes sense. To that end we will increase her diltiazem. If this is insufficient to control her symptoms, we will consider Tikosyn in the short-term and referral for catheter ablation in the longer term. I have asked her also to followup with Dr. Burnett Sheng to make sure that her CPAP is appropriately titrated as relates to her sleep  apnea.   The following medications were removed from the medication list:    Amiodarone Hcl 200 Mg Tabs (Amiodarone hcl) .Marland Kitchen... Take 1 tablet by mouth two times a day Her updated medication list for this problem includes:    Coumadin 4 Mg Tabs (Warfarin sodium) .Marland Kitchen... Take as directed    Metoprolol Tartrate 50 Mg Tabs (Metoprolol tartrate) .Marland Kitchen... Take 1 1/2  tablets by mouth two times a day  Orders: T-Amiodarone & Metabolites ([id number])  Problem # 2:  DYSPNEA (ICD-786.05) is not clear to what degree her atrial fibrillation contributes to her dyspnea as her as relates to loss of atrial kick versus ratet. This will be the major issue going forward trying to decide what is the appropriate goal of therapy. Her updated medication list for this problem includes:    Losartan Potassium 100 Mg Tabs (Losartan potassium) .Marland Kitchen... Take 1 tablet by mouth  once a day    Furosemide 20 Mg Tabs (Furosemide) .Marland Kitchen... Take one tablet by mouth daily. (stopped 10/15/09)    Metoprolol Tartrate 50 Mg Tabs (Metoprolol tartrate) .Marland Kitchen... Take 1 1/2  tablets by mouth two times a day    Cardizem Cd 240 Mg Xr24h-cap (Diltiazem hcl coated beads) .Marland Kitchen... Take 1 tablet by mouth two times a day  Problem # 3:  TREMOR (ICD-781.0) I suspect a tremor both fine and gross may be related to amiodarone. We will discontinue it.  Problem # 4:  FATIGUE (ICD-780.79) this may be related to the Lopressor or the amiodarone or something altogether different. We'll undertake a beta blocker exclusion trial following the discontinuation of her amiodarone to see if we can reduce this problem  Patient Instructions: 1)  Your physician recommends that you schedule a follow-up appointment in: 1 month with Dr. Graciela Husbands 2)  Your physician recommends that you have  lab work today: (amiodarone) 3)  Your physician has recommended you make the following change in your medication: INCREASE cardizem STOP amiodaron 4)  Your physician has recommended that you have a  sleep study.  This test records several body functions during sleep, including:  brain activity, eye movement, oxygen and carbon dioxide blood levels, heart rate and rhythm, breathing rate and rhythm, the flow of air through your mouth and nose, snoring, body muscle movements, and chest and belly movement. with Dr. Burnett Sheng Prescriptions: CARDIZEM CD 240 MG XR24H-CAP (DILTIAZEM HCL COATED BEADS) Take 1 tablet by mouth two times a day  #60 x 3   Entered by:   Benedict Needy, RN   Authorized by:   Nathen May, MD, [facility]   Signed by:   Benedict Needy, RN on 04/13/2010   Method used:   Electronically to        [facility] (retail)       [address]       [place], [place]  [address]       Ph: [phone]       Fax: [phone]   RxID:   [id number]

## 2010-07-07 NOTE — Medication Information (Signed)
Summary: rov/ewj  Anticoagulant Therapy  Managed by: Bethena Midget, RN, BSN Referring MD: Mariah Milling PCP: Dr. Moses Manners MD: Mariah Milling Indication 1: Atrial Fibrillation Lab Used: [facility] [place] Site: [place] INR POC 1.9 INR RANGE 2.0-3.0  Dietary changes: no    Health status changes: no    Bleeding/hemorrhagic complications: no    Recent/future hospitalizations: no    Any changes in medication regimen? no    Recent/future dental: no  Any missed doses?: no       Is patient compliant with meds? yes       Allergies: 1)  ! Codeine 2)  ! Percocet 3)  ! Morphine 4)  ! Sulfa 5)  ! Persantine 6)  ! * Statins 7)  ! Iodine 8)  ! * Ivp Dye  Anticoagulation Management History:      The patient is taking warfarin and comes in today for a routine follow up visit.  Positive risk factors for bleeding include an age of 5 years or older.  The bleeding index is 'intermediate risk'.  Positive CHADS2 values include History of HTN.  Negative CHADS2 values include Age > 52 years old.  Anticoagulation responsible provider: [name].  INR POC: 1.9.  Cuvette Lot#: [id number].  Exp: 05/2011.    Anticoagulation Management Assessment/Plan:      The patient's current anticoagulation dose is Coumadin 4 mg tabs: Take as directed.  The target INR is 2.0-3.0.  The next INR is due 05/06/2010.  Anticoagulation instructions were given to patient.  Results were reviewed/authorized by Bethena Midget, RN, BSN.  She was notified by Bethena Midget, RN, BSN.         Prior Anticoagulation Instructions: INR 3.3  Hold today's dosage of Coumadin then resume same dosage 1 tablet daily except 1.5 tablets on Sundays, Tuesdays, and Thursdays.   Recheck in 10 days.    Current Anticoagulation Instructions: INR 1.9 Change dose to 6mg s everyday except 4mg s on Mondays, Wednesdays, and Friday. Recheck in 2 weeks.

## 2010-07-07 NOTE — Assessment & Plan Note (Signed)
Summary: EKG/AMD  Nurse Visit   Vital Signs:  Patient profile:   76 year old female Height:      62.5 inches Weight:      220 pounds BMI:     39.74 Pulse rate:   75 / minute Pulse (ortho):   95 / minute BP sitting:   173 / 82  (left arm) Cuff size:   large  Vitals Entered By: Bishop Dublin, CMA (January 22, 2010 2:53 PM)  Past History:  Past Medical History: Last updated: 09/19/2009 1. HTN 2. Hypothyroidism 3. Hyperlipidemia: has been unable to take statins due to muscle pain.  Has tried multiple statins per her report. ' 4. GERD 5. Migraines 6. OSA: not consistently using CPAP 7. CCY  8. Left heart cath x 3 in the past, last 5 years ago.  All "normal" per her report.   9. Contrast allergy 10. Echo (4/11): EF > 55%, mild LVH, moderate LAE, normal RV, normal PA systolic pressure 11. PAD: occlusive disease involving left PT and AT.  12. Paroxysmal atrial fibrillation: Breakthrough afib on sotalol, changed to amiodarone 4/11.  DCCV 4/11.   Family History: Last updated: 09/19/2009 Father with MI at age 37. Daughter with colon cancer. Mother with MI x 2.   Social History: Last updated: 09/19/2009 From [place], widowed, never smoked.  Lives alone.      Visit Type:  Follow-up  CC:  c/o chest pain; more like indigestion and shortness of breath.  Had blurred vision and dizziness and headache (migraine) this a.m..   Current Medications (verified): 1)  Prevacid 30 Mg Cpdr (Lansoprazole) .... Take 1 By Mouth Two Times A Day 2)  Sotalol Hcl 120 Mg Tabs (Sotalol Hcl) .... Take 1 Tablet By Mouth Two Times A Day 3)  Synthroid 125 Mcg Tabs (Levothyroxine Sodium) .... Take 1 By Mouth Once Daily 4)  Losartan Potassium 100 Mg Tabs (Losartan Potassium) .... Take 1 Tablet By Mouth Once A Day 5)  Amiodarone Hcl 200 Mg Tabs (Amiodarone Hcl) .... Take One Tablet By Mouth Two Times A Day (Stopped 10/15/09) 6)  Coumadin 4 Mg Tabs (Warfarin Sodium) .... Take As Directed 7)  Symbicort  160-4.5 Mcg/act Aero (Budesonide-Formoterol Fumarate) .... Up To 2 Puffs Once Daily 8)  B Complex-B12  Tabs (B Complex Vitamins) .... Take 1 By Mouth Once Daily 9)  Fioricet 50-325-40 Mg Tabs (Butalbital-Apap-Caffeine) .... Take Up To 2 By Mouth Up To Four Times A Day As Needed 10)  Vitamin D .... Every Week 11)  Furosemide 20 Mg Tabs (Furosemide) .... Take One Tablet By Mouth Daily. (Stopped 10/15/09) 12)  Klor-Con M10 10 Meq Cr-Tabs (Potassium Chloride Crys Cr) .Marland Kitchen.. 1 Tab By Mouth Daily (Stopped 10/15/09) 13)  Niaspan 500 Mg Cr-Tabs (Niacin (Antihyperlipidemic)) .... Take 1 Tablet Daily X 1 Week, Then Increase To 2 Tablets Daily. (Stopped 10/15/09) 14)  Crestor 5 Mg Tabs (Rosuvastatin Calcium) .... Take 1/2 Tablet By Mouth Every Day  Allergies (verified): 1)  ! Codeine 2)  ! Percocet 3)  ! Morphine 4)  ! Sulfa 5)  ! Persantine 6)  ! * Statins 7)  ! Iodine 8)  ! * Ivp Dye  Patient Instructions: 1)  Your physician has recommended you make the following change in your medication: toprol 50mg  1/2-1 as needed for fast heart rate.  2)  Please call office when you feel yourself going in to A-fib so we can keep track of how many episodes you have.

## 2010-07-07 NOTE — Assessment & Plan Note (Signed)
Summary: EKG/AMD  Nurse Visit  Comments Patient had an EKG.Bishop Dublin, CMA  May 08, 2010 3:43 PM    Allergies: 1)  ! Codeine 2)  ! Percocet 3)  ! Morphine 4)  ! Sulfa 5)  ! Persantine 6)  ! * Statins 7)  ! Iodine 8)  ! * Ivp Dye

## 2010-07-08 ENCOUNTER — Ambulatory Visit: Admit: 2010-07-08 | Payer: Self-pay | Admitting: Cardiovascular Disease

## 2010-07-08 ENCOUNTER — Encounter (INDEPENDENT_AMBULATORY_CARE_PROVIDER_SITE_OTHER): Payer: Medicare Other

## 2010-07-08 ENCOUNTER — Encounter: Payer: Self-pay | Admitting: Cardiovascular Disease

## 2010-07-08 ENCOUNTER — Ambulatory Visit (INDEPENDENT_AMBULATORY_CARE_PROVIDER_SITE_OTHER): Payer: Medicare Other | Admitting: Cardiovascular Disease

## 2010-07-08 DIAGNOSIS — R197 Diarrhea, unspecified: Secondary | ICD-10-CM | POA: Insufficient documentation

## 2010-07-08 DIAGNOSIS — I4891 Unspecified atrial fibrillation: Secondary | ICD-10-CM

## 2010-07-08 DIAGNOSIS — Z7901 Long term (current) use of anticoagulants: Secondary | ICD-10-CM

## 2010-07-08 DIAGNOSIS — R5381 Other malaise: Secondary | ICD-10-CM

## 2010-07-08 DIAGNOSIS — R0989 Other specified symptoms and signs involving the circulatory and respiratory systems: Secondary | ICD-10-CM

## 2010-07-09 LAB — CONVERTED CEMR LAB
BUN: 16 mg/dL (ref 6–23)
Basophils Relative: 0 % (ref 0–1)
CO2: 21 meq/L (ref 19–32)
Chloride: 105 meq/L (ref 96–112)
Creatinine, Ser: 0.96 mg/dL (ref 0.40–1.20)
Eosinophils Absolute: 0.4 10*3/uL (ref 0.0–0.7)
Eosinophils Relative: 3 % (ref 0–5)
Glucose, Bld: 85 mg/dL (ref 70–99)
HCT: 37.5 % (ref 36.0–46.0)
INR: 1.83 — ABNORMAL HIGH (ref <1.50)
MCHC: 33.6 g/dL (ref 30.0–36.0)
MCV: 92.8 fL (ref 78.0–100.0)
Monocytes Absolute: 1.1 10*3/uL — ABNORMAL HIGH (ref 0.1–1.0)
Monocytes Relative: 9 % (ref 3–12)
Neutrophils Relative %: 47 % (ref 43–77)
RBC: 4.04 M/uL (ref 3.87–5.11)

## 2010-07-09 NOTE — Medication Information (Signed)
Summary: rov/tm  Anticoagulant Therapy  Managed by: Cloyde Reams, RN, BSN Referring MD: Mariah Milling PCP: Dr. Moses Manners MD: Mariah Milling Indication 1: Atrial Fibrillation Lab Used: [facility] [place] Site: [place] INR POC 2.5 INR RANGE 2.0-3.0  Dietary changes: no    Health status changes: no    Bleeding/hemorrhagic complications: no    Recent/future hospitalizations: no    Any changes in medication regimen? no    Recent/future dental: no  Any missed doses?: no       Is patient compliant with meds? yes       Allergies: 1)  ! Codeine 2)  ! Percocet 3)  ! Morphine 4)  ! Sulfa 5)  ! Persantine 6)  ! * Statins 7)  ! Iodine 8)  ! * Ivp Dye  Anticoagulation Management History:      The patient is taking warfarin and comes in today for a routine follow up visit.  Positive risk factors for bleeding include an age of 74 years or older.  The bleeding index is 'intermediate risk'.  Positive CHADS2 values include History of HTN.  Negative CHADS2 values include Age > 79 years old.  Anticoagulation responsible provider: [name].  INR POC: 2.5.  Cuvette Lot#: [id number].  Exp: 05/2011.    Anticoagulation Management Assessment/Plan:      The patient's current anticoagulation dose is Coumadin 4 mg tabs: Take as directed.  The target INR is 2.0-3.0.  The next INR is due 06/03/2010.  Anticoagulation instructions were given to patient.  Results were reviewed/authorized by Cloyde Reams, RN, BSN.  She was notified by Cloyde Reams RN.         Prior Anticoagulation Instructions: INR 1.8 Today take 1.5 pills then change dose to 1.5 pills everyday except 1 pill on Mondays and Fridays. Recheck in 2 weeks.   Current Anticoagulation Instructions: INR 2.5  Continue on same dosage 1.5 tablets daily except 1 tablet on Mondays and Fridays.  Recheck in 2 weeks.

## 2010-07-09 NOTE — Medication Information (Signed)
Summary: pacer check at 4pm/ewj  Anticoagulant Therapy  Managed by: [name], RN Referring MD: Mariah Milling PCP: Dr. Burnett Sheng Supervising MD: Gala Romney MD, Reuel Boom Indication 1: Atrial Fibrillation Lab Used: [facility] [place] Site: [place] INR POC 3.4 INR RANGE 2.0-3.0  Dietary changes: yes       Details: decr po intake  Health status changes: yes       Details: pt has current viral infection; diarrhea  Bleeding/hemorrhagic complications: no    Recent/future hospitalizations: no    Any changes in medication regimen? yes       Details: started lomotil and compizene  Recent/future dental: no  Any missed doses?: no       Is patient compliant with meds? yes       Allergies: 1)  ! Codeine 2)  ! Percocet 3)  ! Morphine 4)  ! Sulfa 5)  ! Persantine 6)  ! * Statins 7)  ! Iodine 8)  ! * Ivp Dye  Anticoagulation Management History:      The patient is taking warfarin and comes in today for a routine follow up visit.  Positive risk factors for bleeding include an age of 74 years or older.  The bleeding index is 'intermediate risk'.  Positive CHADS2 values include History of HTN.  Negative CHADS2 values include Age > 74 years old.  Her last INR was 1.83.  Anticoagulation responsible provider: Aitana Burry MD, Reuel Boom.  INR POC: 3.4.  Exp: 07/2011.    Anticoagulation Management Assessment/Plan:      The patient's current anticoagulation dose is Coumadin 4 mg tabs: Take as directed.  The target INR is 2.0-3.0.  The next INR is due 07/08/2010.  Anticoagulation instructions were given to patient.  Results were reviewed/authorized by [name], RN.  She was notified by [name] RN.         Prior Anticoagulation Instructions: INR 1.6  Take 2 tablets today, then resume 1.5 tablets daily.  Recheck on 74/20/12.  Current Anticoagulation Instructions: INR 3.4  Hold todays dose, then resume same dosage 1.5 tablets every day. Recheck in 1 1/2 weeks at ov with Dr. Mariah Milling.

## 2010-07-09 NOTE — Medication Information (Signed)
Summary: rov/ewj  Anticoagulant Therapy  Managed by: Cloyde Reams, RN, BSN Referring MD: Mariah Milling PCP: Dr. Moses Manners MD: Graciela Husbands MD, Viviann Spare Indication 1: Atrial Fibrillation Lab Used: [facility] [place] Site: [place] INR POC 2.0 INR RANGE 2.0-3.0  Dietary changes: no    Health status changes: no    Bleeding/hemorrhagic complications: no    Recent/future hospitalizations: no    Any changes in medication regimen? no    Recent/future dental: no  Any missed doses?: no       Is patient compliant with meds? yes       Allergies: 1)  ! Codeine 2)  ! Percocet 3)  ! Morphine 4)  ! Sulfa 5)  ! Persantine 6)  ! * Statins 7)  ! Iodine 8)  ! * Ivp Dye  Anticoagulation Management History:      The patient is taking warfarin and comes in today for a routine follow up visit.  Positive risk factors for bleeding include an age of 74 years or older.  The bleeding index is 'intermediate risk'.  Positive CHADS2 values include History of HTN.  Negative CHADS2 values include Age > 2 years old.  Anticoagulation responsible provider: Graciela Husbands MD, Viviann Spare.  INR POC: 2.0.  Cuvette Lot#: [id number].  Exp: 07/2011.    Anticoagulation Management Assessment/Plan:      The patient's current anticoagulation dose is Coumadin 4 mg tabs: Take as directed.  The target INR is 2.0-3.0.  The next INR is due 06/17/2010.  Anticoagulation instructions were given to patient.  Results were reviewed/authorized by Cloyde Reams, RN, BSN.  She was notified by Cloyde Reams RN.         Prior Anticoagulation Instructions: INR 1.7  Start taking 6mg  daily.  Recheck in 1 week.  Current Anticoagulation Instructions: INR 2.0  Continue same dosage 6mg  daily.  Recheck at first pacer wound check.

## 2010-07-09 NOTE — Progress Notes (Signed)
Summary: Flu  Phone Note Call from Patient Call back at Home Phone [phone]   Caller: Self Call For: Jacqueline Lozano Summary of Call: Pt called stating that she was told to try OTC medicine for nausea and diarrhea and to call her primary care if it worsens.  Pt states that she has been calling her primary care and is not getting a return call.  Pt wants to know if we can order labs for a bacterial infection.  I explained to the pt that due to the fact that we are Cardiology and not Internal Medicine, that we cannot order that kind of bloodwork.  I advised her that if she did not want to wait to hear from her primary care, that she could go to an Urgent care or a walk-in clinic.  The pt was okay with this suggestion. Initial call taken by: Harlon Flor,  June 24, 2010 2:51 PM  Follow-up for Phone Call        Pt in today for pacemaker check and coumadin clinic. Pt reports she has the flu. Pt states she is going to her PMD after this visit. Follow-up by: [name] RN,  June 26, 2010 2:22 PM

## 2010-07-09 NOTE — Medication Information (Signed)
Summary: rov/ewj  Anticoagulant Therapy  Managed by: Cloyde Reams, RN, BSN Referring MD: Mariah Milling PCP: Dr. Moses Manners MD: Mariah Milling Indication 1: Atrial Fibrillation Lab Used: [facility] [place] Site: [place] INR POC 1.7 INR RANGE 2.0-3.0  Dietary changes: no    Health status changes: no    Bleeding/hemorrhagic complications: no    Recent/future hospitalizations: no    Any changes in medication regimen? no    Recent/future dental: no  Any missed doses?: no       Is patient compliant with meds? yes      Comments: Per Dr Graciela Husbands ok for AV node ablation.   Allergies: 1)  ! Codeine 2)  ! Percocet 3)  ! Morphine 4)  ! Sulfa 5)  ! Persantine 6)  ! * Statins 7)  ! Iodine 8)  ! * Ivp Dye   Anticoagulation Management History:      The patient is taking warfarin and comes in today for a routine follow up visit.  Positive risk factors for bleeding include an age of 74 years or older.  The bleeding index is 'intermediate risk'.  Positive CHADS2 values include History of HTN.  Negative CHADS2 values include Age > 50 years old.  Anticoagulation responsible provider: [name].  INR POC: 1.7.  Cuvette Lot#: [id number].  Exp: 05/2011.    Anticoagulation Management Assessment/Plan:      The patient's current anticoagulation dose is Coumadin 4 mg tabs: Take as directed.  The target INR is 2.0-3.0.  The next INR is due 06/10/2010.  Anticoagulation instructions were given to patient.  Results were reviewed/authorized by Cloyde Reams, RN, BSN.  She was notified by Cloyde Reams RN.         Prior Anticoagulation Instructions: INR 2.5  Continue on same dosage 1.5 tablets daily except 1 tablet on Mondays and Fridays.  Recheck in 2 weeks.    Current Anticoagulation Instructions: INR 1.7  Start taking 6mg  daily.  Recheck in 1 week. Prescriptions: COUMADIN 4 MG TABS (WARFARIN SODIUM) Take as directed  #50 x 3   Entered by:   Cloyde Reams RN   Authorized by:    Nathen May, MD, [facility]   Signed by:   Cloyde Reams RN on 06/03/2010   Method used:   Electronically to        [facility] [address] (retail)       [address]       [place], [place]  [address]       Ph: [phone]       Fax: [phone]   RxID:   [id number] FUROSEMIDE 20 MG TABS (FUROSEMIDE) Take one tablet by mouth daily.  #30 x 6   Entered by:   Cloyde Reams RN   Authorized by:   Nathen May, MD, [facility]   Signed by:   Cloyde Reams RN on 06/03/2010   Method used:   Electronically to        [facility] [address] (retail)       [address], [address]       [place], [place]  [address]       Ph: [phone]       Fax: [phone]   RxID:   [id number]

## 2010-07-09 NOTE — Procedures (Signed)
Summary: PACER/AMD   Current Medications (verified): 1)  Prilosec 20 Mg Cpdr (Omeprazole) .... Take 1 Tablet By Mouth Two Times A Day 2)  Synthroid 125 Mcg Tabs (Levothyroxine Sodium) .... Take 1 By Mouth Once Daily 3)  Losartan Potassium 100 Mg Tabs (Losartan Potassium) .... Take 1 Tablet By Mouth Once A Day 4)  Coumadin 4 Mg Tabs (Warfarin Sodium) .... Take As Directed 5)  Symbicort 160-4.5 Mcg/act Aero (Budesonide-Formoterol Fumarate) .... Up To 2 Puffs Once Daily 6)  B Complex-B12  Tabs (B Complex Vitamins) .... Take 1 By Mouth Once Daily 7)  Fioricet 50-325-40 Mg Tabs (Butalbital-Apap-Caffeine) .... Take Up To 2 By Mouth Up To Four Times A Day As Needed 8)  Vitamin D .... Every Week 9)  Furosemide 20 Mg Tabs (Furosemide) .... Take One Tablet By Mouth Daily. 10)  Metoprolol Tartrate 50 Mg Tabs (Metoprolol Tartrate) .... Take 2 Tablets Two Times A Day 11)  Cardizem Cd 240 Mg Xr24h-Cap (Diltiazem Hcl Coated Beads) .... Take 1 Tablet By Mouth Two Times A Day 12)  Digoxin 0.25 Mg Tabs (Digoxin) .... Take One Tablet By Mouth Daily  Allergies (verified): 1)  ! Codeine 2)  ! Percocet 3)  ! Morphine 4)  ! Sulfa 5)  ! Persantine 6)  ! * Statins 7)  ! Iodine 8)  ! * Ivp Dye   PPM Specifications Following MD:  Sherryl Manges, MD     PPM Vendor:  [employer]     PPM Model Number:  [id number]     PPM Serial Number:  [id number] PPM DOI:  06/12/2010     PPM Implanting MD:  Sherryl Manges, MD  Lead 1    Location: RV     DOI: 06/12/2010     Model #: [id number]     Serial #: [id number]     Status: active Lead 2    Location: LV     DOI: 06/12/2010     Model #: [id number]     Serial #: [id number]     Status: active  Magnet Response Rate:  BOL 85 ERI 65  Indications:  CHF   PPM Follow Up Remote Check?  No Battery Voltage:  3.04 V     Battery Est. Longevity:  3.5 YEARS     Pacer Dependent:  Yes     Right Ventricle  Impedance: 798 ohms, Threshold: 1.0 V at 0.4 msec Left Ventricle  Impedance: 1026 ohms, Threshold:  2.25 V at 1.0 msec  Episodes MS Episodes:  0     Percent Mode Switch:  0     Coumadin:  Yes Ventricular Pacing:  100%  Parameters Mode:  VVIR     Lower Rate Limit:  80     Upper Rate Limit:  120 Next Cardiology Appt Due:  08/06/2010 Tech Comments:  Steri strips removed, no redness or edema noted.  Base rate reprogrammed to 80 and rate response on.  ROV 1 month [facility]. Altha Harm, LPN  June 26, 2010 4:23 PM

## 2010-07-09 NOTE — Assessment & Plan Note (Signed)
Summary: F3W/AMD   Visit Type:  Follow-up Primary Provider:  Dr. Burnett Sheng  CC:  c/o chest pain and shortness of breath the other day while at the movies..  History of Present Illness: Jacqueline Lozano is seen with her daughter in anticipation of AV ablation scheduled fro Friday for control  of paroxysmal atrial fibrillation presents with recent hospitalizations assoc with rapic ventricular response.    Patient had been on sotalol for about the last 2 years but had had episodes of breakthrough atrial fibrillation on sotalol.  then amiodarone 200 mg two times a day.she istaking myultpie rate control meds  We had a lengthy discussion at the last visit regarding PVI vs Ablation; see that note.  The decsion was to proceed with the latter and CRT-P    She remains SOB with modest ortohpne and PND  occ edema  Current Medications (verified): 1)  Prilosec 20 Mg Cpdr (Omeprazole) .... Take 1 Tablet By Mouth Two Times A Day 2)  Synthroid 125 Mcg Tabs (Levothyroxine Sodium) .... Take 1 By Mouth Once Daily 3)  Losartan Potassium 100 Mg Tabs (Losartan Potassium) .... Take 1 Tablet By Mouth Once A Day 4)  Coumadin 4 Mg Tabs (Warfarin Sodium) .... Take As Directed 5)  Symbicort 160-4.5 Mcg/act Aero (Budesonide-Formoterol Fumarate) .... Up To 2 Puffs Once Daily 6)  B Complex-B12  Tabs (B Complex Vitamins) .... Take 1 By Mouth Once Daily 7)  Fioricet 50-325-40 Mg Tabs (Butalbital-Apap-Caffeine) .... Take Up To 2 By Mouth Up To Four Times A Day As Needed 8)  Vitamin D .... Every Week 9)  Furosemide 20 Mg Tabs (Furosemide) .... Take One Tablet By Mouth Daily. 10)  Klor-Con M10 10 Meq Cr-Tabs (Potassium Chloride Crys Cr) .Marland Kitchen.. 1 Tab By Mouth Daily (Stopped 10/15/09) 11)  Metoprolol Tartrate 50 Mg Tabs (Metoprolol Tartrate) .... Take 2 Tablets Two Times A Day 12)  Cardizem Cd 240 Mg Xr24h-Cap (Diltiazem Hcl Coated Beads) .... Take 1 Tablet By Mouth Two Times A Day 13)  Digoxin 0.25 Mg Tabs (Digoxin) .... Take One  Tablet By Mouth Daily  Allergies (verified): 1)  ! Codeine 2)  ! Percocet 3)  ! Morphine 4)  ! Sulfa 5)  ! Persantine 6)  ! * Statins 7)  ! Iodine 8)  ! * Ivp Dye  Past History:  Past Medical History: Last updated: 09/19/2009 1. HTN 2. Hypothyroidism 3. Hyperlipidemia: has been unable to take statins due to muscle pain.  Has tried multiple statins per her report. ' 4. GERD 5. Migraines 6. OSA: not consistently using CPAP 7. CCY  8. Left heart cath x 3 in the past, last 5 years ago.  All "normal" per her report.   9. Contrast allergy 10. Echo (4/11): EF > 55%, mild LVH, moderate LAE, normal RV, normal PA systolic pressure 11. PAD: occlusive disease involving left PT and AT.  12. Paroxysmal atrial fibrillation: Breakthrough afib on sotalol, changed to amiodarone 4/11.  DCCV 4/11.   Family History: Last updated: 09/19/2009 Father with MI at age 47. Daughter with colon cancer. Mother with MI x 2.   Social History: Last updated: 09/19/2009 From [place], widowed, never smoked.  Lives alone.    Vital Signs:  Patient profile:   76 year old female Height:      62.5 inches Weight:      220 pounds BMI:     39.74 Pulse rate:   104 / minute BP sitting:   118 / 78  (left arm) Cuff  size:   large  Vitals Entered By: Jacqueline Lozano, CMA (June 10, 2010 2:45 PM)  Physical Exam  General:  The patient was alert and oriented in no acute distress.Neck veins were flat, carotids were brisk. Lungs were clear. Heart sounds were irregular without murmurs or gallops. Abdomen was soft with active bowel sounds. There is no clubbing cyanosis; mild edema     Impression & Recommendations:  Problem # 1:  ATRIAL FIBRILLATION (ICD-427.31)  we will proceed with withAV ablation.  She is to hold coumadin between now and Fri;  she has a dye allergy so she will be premedicated with prednosone and anti histamies;  we ghave reveiwed benefits and riskas, incl infection and the potential impact  taht can have on device dependent patients.  They would like toproceed with a single day procedure Her updated medication list for this problem includes:    Coumadin 4 Mg Tabs (Warfarin sodium) .Marland Kitchen... Take as directed    Metoprolol Tartrate 50 Mg Tabs (Metoprolol tartrate) .Marland Kitchen... Take 2 tablets two times a day    Digoxin 0.25 Mg Tabs (Digoxin) .Marland Kitchen... Take one tablet by mouth daily  Orders: T-Basic Metabolic Panel [id number]) T-Protime, Auto ([id number]) T-CBC w/Diff [id number])  Problem # 2:  DYSPNEA (ICD-786.05)  as above Her updated medication list for this problem includes:    Losartan Potassium 100 Mg Tabs (Losartan potassium) .Marland Kitchen... Take 1 tablet by mouth once a day    Furosemide 20 Mg Tabs (Furosemide) .Marland Kitchen... Take one tablet by mouth daily.    Metoprolol Tartrate 50 Mg Tabs (Metoprolol tartrate) .Marland Kitchen... Take 2 tablets two times a day    Cardizem Cd 240 Mg Xr24h-cap (Diltiazem hcl coated beads) .Marland Kitchen... Take 1 tablet by mouth two times a day    Digoxin 0.25 Mg Tabs (Digoxin) .Marland Kitchen... Take one tablet by mouth daily  Her updated medication list for this problem includes:    Losartan Potassium 100 Mg Tabs (Losartan potassium) .Marland Kitchen... Take 1 tablet by mouth once a day    Furosemide 20 Mg Tabs (Furosemide) .Marland Kitchen... Take one tablet by mouth daily.    Metoprolol Tartrate 50 Mg Tabs (Metoprolol tartrate) .Marland Kitchen... Take 2 tablets two times a day    Cardizem Cd 240 Mg Xr24h-cap (Diltiazem hcl coated beads) .Marland Kitchen... Take 1 tablet by mouth two times a day    Digoxin 0.25 Mg Tabs (Digoxin) .Marland Kitchen... Take one tablet by mouth daily  Orders: T-Basic Metabolic Panel [id number]) T-CBC w/Diff [id number])  Other Orders: T-PTT ([id number])  Patient Instructions: 1)  You will be scheduled for follow up appointment after surgery. 2)  Your physician has recommended you make the following change in your medication: TAKE PREDNISONE 60mg  THURSDAY PM . TAKE BENADRYL 50MG  THURSDAY PM. TAKE RANITIDINE 150MG  1 TABLET   THURSDAY PM. HOLD FUROSEMIDE MORNING OF SURGERY. STOP TAKING COUMADIN TODAY 06/10/10  Prescriptions: PREDNISONE 20 MG TABS (PREDNISONE) Take 3 tablets THURSDAY PM  #3 x 0   Entered by:   [name] RN   Authorized by:   Nathen May, MD, [facility]   Signed by:   [name] RN on 06/10/2010   Method used:   Electronically to        [facility] [address] (retail)       [address]       [place], [place]  [address]       Ph: [phone]       Fax:  [phone]   RxID:   [id number] RANITIDINE HCL 150 MG TABS (RANITIDINE HCL) Take 1 tablet by mouth THURSDAY PM  #1 x 0   Entered by:   [name] RN   Authorized by:   Nathen May, MD, [facility]   Signed by:   [name] RN on 06/10/2010   Method used:   Electronically to        [facility] [address]retail)       [address], [address]       [place], [place]  [address]       Ph: [phone]       Fax: [phone]   RxID:   [id number] DIPHENHYDRAMINE HCL 50 MG TABS (DIPHENHYDRAMINE HCL) Take 1 tablet THURSDAY PM  #1 x 0   Entered by:   [name] RN   Authorized by:   Nathen May, MD, [facility]   Signed by:   [name] RN on 06/10/2010   Method used:   Electronically to        [facility] [address]retail)       [address], [address]       [place], [place]  [address]       Ph: [phone]       Fax: [phone]   RxID:   [id number] PREDNISONE 20 MG TABS (PREDNISONE) Take 3 tablets THURSDAY PM, and 3 tablets FRIDAY AM  #6 x 0   Entered by:   [name] RN   Authorized by:   Nathen May, MD, [facility]   Signed by:   [name] RN on 06/10/2010   Method used:   Electronically to        [facility] [address]ail)       [address], [address]       [place], [place]  [address]       Ph: [phone]       Fax: [phone]   RxID:    [id number]

## 2010-07-09 NOTE — Medication Information (Signed)
Summary: CCR/AMD  Anticoagulant Therapy  Managed by: Cloyde Reams, RN, BSN Referring MD: Mariah Milling PCP: Dr. Moses Manners MD: Graciela Husbands MD, Viviann Spare Indication 1: Atrial Fibrillation Lab Used: [facility] [place] Site: [place] INR POC 1.6 INR RANGE 2.0-3.0  Dietary changes: no    Health status changes: no    Bleeding/hemorrhagic complications: no    Recent/future hospitalizations: no    Any changes in medication regimen? no    Recent/future dental: no  Any missed doses?: yes     Details: Recent ablation and pacer insertion.  held x 1 dose in hospital.    Is patient compliant with meds? yes       Allergies: 1)  ! Codeine 2)  ! Percocet 3)  ! Morphine 4)  ! Sulfa 5)  ! Persantine 6)  ! * Statins 7)  ! Iodine 8)  ! * Ivp Dye  Anticoagulation Management History:      The patient is taking warfarin and comes in today for a routine follow up visit.  Positive risk factors for bleeding include an age of 51 years or older.  The bleeding index is 'intermediate risk'.  Positive CHADS2 values include History of HTN.  Negative CHADS2 values include Age > 66 years old.  Her last INR was 1.83.  Anticoagulation responsible provider: Graciela Husbands MD, Viviann Spare.  INR POC: 1.6.  Cuvette Lot#: [id number].  Exp: 07/2011.    Anticoagulation Management Assessment/Plan:      The patient's current anticoagulation dose is Coumadin 4 mg tabs: Take as directed.  The target INR is 2.0-3.0.  The next INR is due 06/26/2010.  Anticoagulation instructions were given to patient.  Results were reviewed/authorized by Cloyde Reams, RN, BSN.  She was notified by Cloyde Reams RN.         Prior Anticoagulation Instructions: INR 2.0  Continue same dosage 6mg  daily.  Recheck at first pacer wound check.    Current Anticoagulation Instructions: INR 1.6  Take 2 tablets today, then resume 1.5 tablets daily.  Recheck on 06/26/10.

## 2010-07-09 NOTE — Progress Notes (Signed)
Summary: Problems  Phone Note Call from Patient   Summary of Call: Pt is fatigued, can hardly get up and move around.  I am noting that the pt's speech appears to be a little slurred which is unusual for the pt.  Pt c/o being SOB.  States that she is having some "gas-like" pains and spasms around her heart.  BP is low.  74/74, 146/72.  Pt called bc she was not sure of the dosage of coumadin that she should be taking.  Pt is not sure how much of her BP medication that she should be taking. Initial call taken by: Harlon Flor,  July 03, 2010 8:30 AM  Follow-up for Phone Call        Spoke to pt, had her retake her BP on the opposite arm while on the phone with her, it was 146/68 HR 81. Pt does c/o fatigue and her speech does seem "slower" however pt took compazine 1 hour ago. Reveiwed medications with pt, she is taking all her medications correctly correlating with d/c summary. And pt is currently taking Coumadin 6 mg daily and her last INR was 2.2 on d/c from hospital 07/01/10. Pt is to f/u with Dr. Mariah Milling and coumadin clinic in 1 week 07/08/10. Advised pt to hold off on compazine if she is feeling too fatigued. Pt will notify us with any changes.  Follow-up by: [name] RN,  July 03, 2010 9:00 AM

## 2010-07-09 NOTE — Progress Notes (Signed)
Summary: nausea and diarrhea  Phone Note Call from Patient Call back at [facility] Phone [phone]   Caller: Patient Call For: [name] Summary of Call: pt is complaining of diarrhea and nausea since last night.  pt had pacemaker placed 06/12/10.  Jacqueline Lozano wants to know what she can take for her diarrhea. I told her about OTC immodium but I wanted to check with you to see if it is okay for her to take.    Pharmacy [facility] [address] Initial call taken by: Lysbeth Galas CMA,  June 23, 2010 1:37 PM  Follow-up for Phone Call        Per Dr. Mariah Milling, pt can take Immodium and needs to call her PCP to let them know about her diarrhea. Follow-up by: Lysbeth Galas CMA,  June 23, 2010 2:32 PM  Additional Follow-up for Phone Call Additional follow up Details #1::        pt notified and stated she will call her PCP if immodium does not work.  Additional Follow-up by: Lysbeth Galas CMA,  June 23, 2010 2:52 PM

## 2010-07-13 ENCOUNTER — Encounter: Payer: Self-pay | Admitting: Cardiovascular Disease

## 2010-07-14 ENCOUNTER — Encounter: Payer: Self-pay | Admitting: Internal Medicine

## 2010-07-14 ENCOUNTER — Telehealth: Payer: Self-pay | Admitting: Cardiovascular Disease

## 2010-07-15 ENCOUNTER — Encounter: Payer: Self-pay | Admitting: Cardiovascular Disease

## 2010-07-15 ENCOUNTER — Encounter (INDEPENDENT_AMBULATORY_CARE_PROVIDER_SITE_OTHER): Payer: Medicare Other

## 2010-07-15 DIAGNOSIS — Z7901 Long term (current) use of anticoagulants: Secondary | ICD-10-CM

## 2010-07-15 DIAGNOSIS — I4891 Unspecified atrial fibrillation: Secondary | ICD-10-CM

## 2010-07-15 NOTE — Medication Information (Signed)
Summary: ROV/PT HAS F/U WITH DR Mariah Milling AT 2:45/AMD  Anticoagulant Therapy  Managed by: Cloyde Reams, RN, BSN Referring MD: Mariah Milling PCP: Dr. Moses Manners MD: Mariah Milling Indication 1: Atrial Fibrillation Lab Used: [facility] [place] Site: [place] INR POC 3.3 INR RANGE 2.0-3.0  Dietary changes: no    Health status changes: yes       Details: Seeing Dr Mariah Milling today.  Pt has had flu like symptoms x 2 weeks. Nausea, vomiting, and diarrhea  Bleeding/hemorrhagic complications: no    Recent/future hospitalizations: no    Any changes in medication regimen? yes       Details: Started on Cipro, pt has had 3 doses, taking 500mg  bid x 3 days.   Recent/future dental: no  Any missed doses?: no       Is patient compliant with meds? yes       Allergies: 1)  ! Codeine 2)  ! Percocet 3)  ! Morphine 4)  ! Sulfa 5)  ! Persantine 6)  ! * Statins 7)  ! Iodine 8)  ! * Ivp Dye  Anticoagulation Management History:      The patient is taking warfarin and comes in today for a routine follow up visit.  Positive risk factors for bleeding include an age of 74 years or older.  The bleeding index is 'intermediate risk'.  Positive CHADS2 values include History of HTN.  Negative CHADS2 values include Age > 74 years old.  Her last INR was 1.83.  Anticoagulation responsible provider: Fernado Brigante.  INR POC: 3.3.  Exp: 07/2011.    Anticoagulation Management Assessment/Plan:      The patient's current anticoagulation dose is Coumadin 4 mg tabs: Take as directed.  The target INR is 2.0-3.0.  The next INR is due 07/15/2010.  Anticoagulation instructions were given to patient.  Results were reviewed/authorized by Cloyde Reams, RN, BSN.  She was notified by Cloyde Reams RN.         Prior Anticoagulation Instructions: INR 3.4  Hold todays dose, then resume same dosage 1.5 tablets every day. Recheck in 1 1/2 weeks at ov with Dr. Mariah Milling.  Current Anticoagulation Instructions: INR 3.3  Skip  today's dosage of Coumadin, then start taking 1.5 tablets daily except 1 tablet on Fridays.  Recheck in 1 week.

## 2010-07-15 NOTE — Cardiovascular Report (Signed)
Summary: Office Visit   Office Visit   Imported By: Roderic Ovens 07/09/2010 10:30:35  _____________________________________________________________________  External Attachment:    Type:   Image     Comment:   External Document

## 2010-07-15 NOTE — Assessment & Plan Note (Signed)
Summary: 3 MONTH F/U/LABS BMP AND BNP/SAB   Vital Signs:  Patient profile:   76 year old female Height:      62.5 inches Weight:      220 pounds BMI:     39.74 Pulse rate:   80 / minute BP sitting:   130 / 82  (left arm) Cuff size:   large  Vitals Entered By: Bishop Dublin, CMA (July 08, 2010 11:05 AM)  Visit Type:  Follow-up Primary Provider:  Dr. Burnett Sheng  CC:  c/o nausea & vomiting with diarrhea for about 1-2 weeks.  She was @  [facility] for 3 days with the same symptoms of nausea and diarrhea and vomiting.Marland Kitchen  History of Present Illness: Jacqueline Lozano  Is a pleasant 76 year old woman with a history of Atrial fibrillation, status post arteriovenous node ablation, CRT-P, Hypertension, Paroxysmal nocturnal dyspnea, Hypothyroidism, Diastolic congestive heart failure presents for sick patient evaluation. She has had chronic nausea, diarrhea and malaise for 3 weeks since her pacemaker was placed.  She reports that she had severe diarrhea for quite a few days, now has minimal p.o. intake and only has nausea and vomiting. She has been prescribed ciprofloxacin for 3 days and Compazine with no improvement of her symptoms. She is not taking much oral intake due to nausea. She is weak, has been spending most of her time in bed at home. She has mild lightheadedness the blood pressure has been stable. She denies any tachycardia palpitations. No soreness around her pacemaker site.  she has been in and out of the emergency room and has had lab work done through her primary care physician. Initial sodium on the 19th was low, repeat in the emergency room on the 23rd also showed hyponatremia. Creatinine was mildly elevated at that time.  EKG today shows paced rhythm at 80 beats per minute  Current Medications (verified): 1)  Prevacid 30 Mg Cpdr (Lansoprazole) .... Two Tablets Two Times A Day 2)  Synthroid 125 Mcg Tabs (Levothyroxine Sodium) .... Take 1 By Mouth Once Daily 3)  Coumadin 4 Mg Tabs (Warfarin  Sodium) .... Take As Directed 4)  Symbicort 160-4.5 Mcg/act Aero (Budesonide-Formoterol Fumarate) .... Up To 2 Puffs Once Daily 5)  B Complex-B12  Tabs (B Complex Vitamins) .... Take 1 By Mouth Once Daily 6)  Fioricet 50-325-40 Mg Tabs (Butalbital-Apap-Caffeine) .... Take Up To 2 By Mouth Up To Four Times A Day As Needed 7)  Vitamin D .... Every Week 8)  Furosemide 20 Mg Tabs (Furosemide) .... Take One Tablet By Mouth Daily. 9)  Lopressor 50 Mg Tabs (Metoprolol Tartrate) .... One Tablet Two Times A Day 10)  Cardizem Cd 240 Mg Xr24h-Cap (Diltiazem Hcl Coated Beads) .... Take 1 Tablet Once Daily 11)  Digoxin 0.25 Mg Tabs (Digoxin) .... Take One Tablet By Mouth Daily 12)  Diovan 80 Mg Tabs (Valsartan) .... One Tablet Two Times A Day 13)  Cipro 500 Mg Tabs (Ciprofloxacin Hcl) .... One Tablet Two Times A Day For 3 Days. She Was Given By Dr. Burnett Sheng.  She Has 1 Day Left.  Allergies (verified): 1)  ! Codeine 2)  ! Percocet 3)  ! Morphine 4)  ! Sulfa 5)  ! Persantine 6)  ! * Statins 7)  ! Iodine 8)  ! * Ivp Dye  Past History:  Past Medical History: Last updated: 09/19/2009 1. HTN 2. Hypothyroidism 3. Hyperlipidemia: has been unable to take statins due to muscle pain.  Has tried multiple statins per her report. '  4. GERD 5. Migraines 6. OSA: not consistently using CPAP 7. CCY  8. Left heart cath x 3 in the past, last 5 years ago.  All "normal" per her report.   9. Contrast allergy 10. Echo (4/11): EF > 55%, mild LVH, moderate LAE, normal RV, normal PA systolic pressure 11. PAD: occlusive disease involving left PT and AT.  12. Paroxysmal atrial fibrillation: Breakthrough afib on sotalol, changed to amiodarone 4/11.  DCCV 4/11.   Family History: Last updated: 09/19/2009 Father with MI at age 14. Daughter with colon cancer. Mother with MI x 2.   Social History: Last updated: 09/19/2009 From [place], widowed, never smoked.  Lives alone.    Past Surgical History: Pacemaker Implant   Jan. 6, 2012 Ablation Jan. 6, 2012 appendicitis gallbladder removed hysterectomy carpal tunnel surgery cardioversion  Review of Systems       The patient complains of weight loss.  The patient denies fever, weight gain, vision loss, decreased hearing, hoarseness, chest pain, syncope, dyspnea on exertion, peripheral edema, prolonged cough, abdominal pain, incontinence, muscle weakness, depression, and enlarged lymph nodes.         dizzy, malaise, fatigue, chills  Physical Exam  General:  tired-appearing woman who is pale and lying on the examination table, no apparent distress Head:  normocephalic and atraumatic Neck:  Neck supple, no JVD. No masses, thyromegaly or abnormal cervical nodes. Lungs:  Clear bilaterally to auscultation and percussion. Heart:  Non-displaced PMI, chest non-tender; regular rate and rhythm, S1, S2 without murmurs, rubs or gallops. Carotid upstroke normal, no bruit.  Pedals normal pulses. No edema, no varicosities. Abdomen:  Bowel sounds positive; abdomen soft and non-tender without masses Msk:  Back normal, normal gait. Muscle strength and tone normal. Pulses:  pulses normal in all 4 extremities Extremities:  No clubbing or cyanosis. Neurologic:  Alert and oriented x 3. Skin:  Intact without lesions or rashes. Psych:  Normal affect.   Impression & Recommendations:  Problem # 1:  DIARRHEA (ICD-787.91) etiology of her diarrhea and nausea is concerning for infectious cause as this has lasted for several weeks. Initial C. difficile was negative and we will repeat this today. Have asked her to continue p.o. fluids and continue Compazine for nausea.  Orders: T-Basic Metabolic Panel [id number]) T-Clostridium difficile Toxin A/B ([id number])  Problem # 2:  FATIGUE (ICD-780.79) Her digoxin level was elevated in the hospital. It was greater than 2. We will hold her digoxin and check her level today. We'll also check a basic metabolic panel and CBC as she  does appear pale. We will repeat a urine. Pending these tests, we could repeat a stool sample. If she continues to feel poorly, we may have to repeat blood cultures.  Orders: T-Magnesium ([id number]) T-CBC w/Diff [id number]) T-Urinalysis with Culture Reflex ([id number])  Problem # 3:  HYPERTENSION, BENIGN (ICD-401.1) Blood pressure is stable on her current medications. We will continue everything for now, hold her Lasix.  The following medications were removed from the medication list:    Losartan Potassium 100 Mg Tabs (Losartan potassium) .Marland Kitchen... Take 1 tablet by mouth once a day Her updated medication list for this problem includes:    Furosemide 20 Mg Tabs (Furosemide) .Marland Kitchen... Take one tablet by mouth daily.    Lopressor 50 Mg Tabs (Metoprolol tartrate) ..... One tablet two times a day    Cardizem Cd 240 Mg Xr24h-cap (Diltiazem hcl coated beads) .Marland Kitchen... Take 1 tablet once daily    Diovan 80 Mg Tabs (Valsartan) ..... One tablet  two times a day  Problem # 4:  ATRIAL FIBRILLATION (ICD-427.31) Paced rhythm his well controlled on her current medication regimen. Backup rate of 80.  The following medications were removed from the medication list:    Digoxin 0.25 Mg Tabs (Digoxin) .Marland Kitchen... Take one tablet by mouth daily Her updated medication list for this problem includes:    Coumadin 4 Mg Tabs (Warfarin sodium) .Marland Kitchen... Take as directed    Lopressor 50 Mg Tabs (Metoprolol tartrate) ..... One tablet two times a day  Orders: EKG w/ Interpretation (93000) T-Digoxin ([id number]) T-CBC w/Diff ([id number])  Patient Instructions: 1)  Your physician recommends that you schedule a follow-up appointment in: 1 month. 2)  Your physician has recommended you make the following change in your medication: Hold Digoxin.  3)  Increase Hydration. Prescriptions: PROCHLORPERAZINE MALEATE 10 MG TABS (PROCHLORPERAZINE MALEATE) Take one tablet 3-4 times a day as needed for nausea.  #90 x 1   Entered by:   Jacqueline Hurst RN   Authorized by:   Jacqueline Arbour MD   Signed by:   Jacqueline Hurst RN on 07/08/2010   Method used:   [facility] to        [id number]       [address]       [place], [place]  [address]       Ph: [phone]       Fax: [phone]   RxID:   [id number]    Orders Added: 1)  EKG w/ Interpretation [93000] 2)  T-Basic Metabolic Panel [id number] 3)  T-Digoxin [[id number]] 4)  T-Magnesium [[id number]] 5)  T-CBC w/Diff [[id number]] 6)  T-Urinalysis with Culture Reflex [65001] 7)  T-Clostridium difficile Toxin A/B [[id number]]  Appended Document: 3 MONTH F/U/LABS BMP AND BNP/SAB Discharge paperwork from a hospital from cardiology suggested she hold her digoxin. She continues on her digoxin and we'll hold it today until we have a level back. We have also instructed her to hold her Lasix starting today.  Appended Document: 3 MONTH F/U/LABS BMP AND BNP/SAB Notified pt to also hold Lasix and Diovan for now.

## 2010-07-16 ENCOUNTER — Telehealth: Payer: Self-pay | Admitting: Cardiovascular Disease

## 2010-07-17 ENCOUNTER — Encounter: Payer: Self-pay | Admitting: Cardiovascular Disease

## 2010-07-17 ENCOUNTER — Encounter (INDEPENDENT_AMBULATORY_CARE_PROVIDER_SITE_OTHER): Payer: Medicare Other

## 2010-07-17 ENCOUNTER — Telehealth: Payer: Self-pay | Admitting: Cardiovascular Disease

## 2010-07-17 DIAGNOSIS — R0602 Shortness of breath: Secondary | ICD-10-CM

## 2010-07-23 NOTE — Assessment & Plan Note (Signed)
Summary: EKG,BMP/ BNP /MES  Nurse Visit   Vital Signs:  Patient profile:   76 year old female Height:      62.5 inches Weight:      228 pounds BMI:     41.19 O2 Sat:      98 % Temp:     98.1 degrees F Pulse rate:   80 / minute BP sitting:   139 / 71  (left arm) Cuff size:   large  Vitals Entered By: Bishop Dublin, CMA (July 17, 2010 12:14 PM) CC: c/o feeling so sick; shortness of breath with little exertion, has a  throbbing in lower back and chest pain. Comments Pt c/o incr SOB since she had pacemaker surgery 06/2010 and has worsened over past week. Pt has just recently started taking her Symbicort as directed (2 puffs two times a day) and wearing her CPAP at night. Pt states while doing this her SOB has incr and this am c/o mid-sternal pressure 5/10 upon minimal exertion. Pt also c/o nausea. EKG done today. Pt is afebrile. No sign of redness, swelling at pacemaker incision site, approximated edges, pt denies any pain. Pt had recently stopped digoxin 0.25 due to elevated digoxin levels. Per Dr. Mariah Milling, we will restart Digoxin at decreased dose of 0.125mg . BMP/BNP drawn today STAT. Per Dr. Mariah Milling we will send script for lasix 40mg  once daily for pt to take through the weekend and pt will call us on Monday to let us know how her wt and breathing is.   Past History:  Past Medical History: Last updated: 09/19/2009 1. HTN 2. Hypothyroidism 3. Hyperlipidemia: has been unable to take statins due to muscle pain.  Has tried multiple statins per her report. ' 4. GERD 5. Migraines 6. OSA: not consistently using CPAP 7. CCY  8. Left heart cath x 3 in the past, last 5 years ago.  All "normal" per her report.   9. Contrast allergy 10. Echo (4/11): EF > 55%, mild LVH, moderate LAE, normal RV, normal PA systolic pressure 11. PAD: occlusive disease involving left PT and AT.  12. Paroxysmal atrial fibrillation: Breakthrough afib on sotalol, changed to amiodarone 4/11.  DCCV 4/11.   Past  Surgical History: Last updated: 07/08/2010 Pacemaker Implant  Jan. 6, 2012 Ablation Jan. 6, 2012 appendicitis gallbladder removed hysterectomy carpal tunnel surgery cardioversion  Family History: Last updated: 09/19/2009 Father with MI at age 57. Daughter with colon cancer. Mother with MI x 2.   Social History: Last updated: 09/19/2009 From [place], widowed, never smoked.  Lives alone.     Current Medications (verified): 1)  Prevacid 30 Mg Cpdr (Lansoprazole) .... Two Tablets Two Times A Day 2)  Synthroid 125 Mcg Tabs (Levothyroxine Sodium) .... Take 1 By Mouth Once Daily 3)  Coumadin 4 Mg Tabs (Warfarin Sodium) .... Take As Directed 4)  Symbicort 160-4.5 Mcg/act Aero (Budesonide-Formoterol Fumarate) .... Up To 2 Puffs Once Daily 5)  B Complex-B12  Tabs (B Complex Vitamins) .... Take 1 By Mouth Once Daily 6)  Fioricet 50-325-40 Mg Tabs (Butalbital-Apap-Caffeine) .... Take Up To 2 By Mouth Up To Four Times A Day As Needed 7)  Vitamin D .... Every Week 8)  Lopressor 50 Mg Tabs (Metoprolol Tartrate) .... One Tablet Two Times A Day 9)  Cardizem Cd 240 Mg Xr24h-Cap (Diltiazem Hcl Coated Beads) .... Take 1 Tablet Once Daily 10)  Prochlorperazine Maleate 10 Mg Tabs (Prochlorperazine Maleate) .... Take One Tablet 3-4 Times A Day As Needed For Nausea. 11)  Flagyl 500 Mg Tabs (Metronidazole) .... Take One Tablet Three Times A Day X 10days  Allergies (verified): 1)  ! Codeine 2)  ! Percocet 3)  ! Morphine 4)  ! Sulfa 5)  ! Persantine 6)  ! * Statins 7)  ! Iodine 8)  ! * Ivp Dye  Visit Type:  Nurse visit  CC:  c/o feeling so sick; shortness of breath with little exertion and has a  throbbing in lower back and chest pain..   Orders Added: 1)  T-Basic Metabolic Panel [[id number]] 2)  T-BNP  (B Natriuretic Peptide) [[id number]] Prescriptions: DIGOXIN 0.125 MG TABS (DIGOXIN) Take one tablet by mouth once daily.  #30 x 6   Entered by:   [name] RN   Authorized by:   [name] MD   Signed by:   [name] RN on 07/17/2010   Method used:   [facility] to        [id number]       [address]       [place], [place]  [address]       Ph: [phone]       Fax: [phone]   RxID:   [id number] FUROSEMIDE 40 MG TABS (FUROSEMIDE) Take one tablet by mouth daily.  #30 x 0   Entered by:   [name] RN   Authorized by:   [name] MD   Signed by:   [name] RN on 07/17/2010   Method used:   Electronically to        [id number]       [address]       [place], [place]  [address]       Ph: [phone]       Fax: [phone]   RxID:   [id number]

## 2010-07-23 NOTE — Progress Notes (Signed)
Summary: Feeling worse  Phone Note Call from Patient Call back at Home Phone [phone]   Caller: Self Call For: Jacqueline Lozano Summary of Call: Pt is getting worse and would like to know if she could go ahead and get her labs drawn.  Pt had a rough time last night. Initial call taken by: Harlon Flor,  July 17, 2010 8:15 AM  Follow-up for Phone Call        Spoke to pt, she did wear her CPAP last night and states she has been taking her Symbicort 2 puffs two times a day. She c/o incr in SOB this AM. Pt had recently been holding Digoxin 0.25mg  as labs results were elevated. Pt is s/p AV Ablation and pacemaker placement 06/2010. Pt does not have hx of CHF, or taking fluid pill. Pt will come in today for EKG and BNP. Will address medications at visit with Dr. Mariah Milling to see if pt needs to restart Digoxin.  Follow-up by: [name] RN,  July 17, 2010 10:59 AM

## 2010-07-23 NOTE — Progress Notes (Signed)
Summary: RX  Phone Note Call from Patient Call back at [facility] Phone [phone]   Caller: Self Call For: [name] Summary of Call: Pt would also like an RX for Lomotil for diarrhea.  [facility] [address] Initial call taken by: Harlon Flor,  July 14, 2010 9:39 AM  Follow-up for Phone Call        Please advise. Follow-up by: Lysbeth Galas CMA,  July 14, 2010 10:00 AM  Additional Follow-up for Phone Call Additional follow up Details #1::        should be over the counter?     Appended Document: RX pt has tried OTC medications for her diarrhea and states it doesn't work as well as the prescription medication. ok to call in?  Appended Document: RX notified patient to call PCP/sab

## 2010-07-23 NOTE — Progress Notes (Signed)
Summary: SOB, FYI  Phone Note Call from Patient Call back at [facility] Phone [phone]   Caller: Self Call For: [name] Summary of Call: Pt wants to know why she is having difficulty breathing.  Pt would like for [name] to return her call. Initial call taken by: Harlon Flor,  July 16, 2010 3:08 PM  Follow-up for Phone Call        Spoke to pt yesterday at her [facility], she had same complaint. She had stated she was only taking her Symbicort as needed and she has CPAP at home but she is not using. Called pt back today and advised again to take her Symbicort as directed (she states on her rx it says she can take 2 puffs two times a day) instructed pt to do this and wear her CPAP every night. And then instructed pt to call me early next week to notify me if SOB has improved, if not we will draw BNP in office per Dr. Mariah Milling. Pt ok with this. She does not have hx of HF or taking any diuretics. Follow-up by: [name] RN,  July 16, 2010 5:13 PM

## 2010-07-23 NOTE — Medication Information (Signed)
Summary: [facility]  Anticoagulant Therapy  Managed by: Cloyde Reams, RN, BSN Referring MD: Mariah Milling PCP: Dr. Moses Manners MD: Mariah Milling Indication 1: Atrial Fibrillation Lab Used: [facility] [place] Site: [place] INR POC 3.1 INR RANGE 2.0-3.0      Any changes in medication regimen? yes       Details: Currently on Flagyl tid x 10 days.    Recent/future dental: no  Any missed doses?: no       Is patient compliant with meds? yes       Allergies: 1)  ! Codeine 2)  ! Percocet 3)  ! Morphine 4)  ! Sulfa 5)  ! Persantine 6)  ! * Statins 7)  ! Iodine 8)  ! * Ivp Dye  Anticoagulation Management History:      The patient is taking warfarin and comes in today for a routine follow up visit.  Positive risk factors for bleeding include an age of 4 years or older.  The bleeding index is 'intermediate risk'.  Positive CHADS2 values include History of HTN.  Negative CHADS2 values include Age > 15 years old.  Her last INR was 1.83.  Anticoagulation responsible provider: [name].  INR POC: 3.1.  Cuvette Lot#: [id number].  Exp: 07/2011.    Anticoagulation Management Assessment/Plan:      The patient's current anticoagulation dose is Coumadin 4 mg tabs: Take as directed.  The target INR is 2.0-3.0.  The next INR is due 07/29/2010.  Anticoagulation instructions were given to patient.  Results were reviewed/authorized by Cloyde Reams, RN, BSN.  She was notified by Cloyde Reams RN.         Prior Anticoagulation Instructions: INR 3.3  Skip today's dosage of Coumadin, then start taking 1.5 tablets daily except 1 tablet on Fridays.  Recheck in 1 week.    Current Anticoagulation Instructions: INR 3.1  Skip today's dosage of Coumadin, then resume same dosage 1.5 tablets daily except 1 tablet on Fridays.  Recheck in 2 weeks.

## 2010-07-23 NOTE — Miscellaneous (Signed)
Summary: Flagyl Rx- C Diff  Clinical Lists Changes  Medications: Added new medication of FLAGYL 500 MG TABS (METRONIDAZOLE) Take one tablet three times a day x 10days - Signed Rx of FLAGYL 500 MG TABS (METRONIDAZOLE) Take one tablet three times a day x 10days;  #30 x 0;  Signed;  Entered by: [name] RN;  Authorized by: [name] MD;  Method used: [id number] [address], [place], [place]  [address], Ph: [phone], Fax: [phone]    Prescriptions: FLAGYL 500 MG TABS (METRONIDAZOLE) Take one tablet three times a day x 10days  #30 x 0   Entered by:   [name] RN   Authorized by:   [name] MD   Signed by:   [name] RN on 07/13/2010   Method used:   Electronically to        [id number]       [address]       [place], [place]  [address]       Ph: [phone]       Fax: [phone]   RxID:   [id number]   Appended Document: Flagyl Rx- C Diff I have changed her script of flagyl  to 14 days in case she continues to have symptoms.  Appended Document: Flagyl Rx- C Diff POSITIVE C.DIFF CULTURE for Jacqueline Lozano

## 2010-07-29 ENCOUNTER — Encounter: Payer: Self-pay | Admitting: Cardiovascular Disease

## 2010-07-29 ENCOUNTER — Encounter (INDEPENDENT_AMBULATORY_CARE_PROVIDER_SITE_OTHER): Payer: Medicare Other

## 2010-07-29 DIAGNOSIS — I4891 Unspecified atrial fibrillation: Secondary | ICD-10-CM

## 2010-07-29 DIAGNOSIS — Z7901 Long term (current) use of anticoagulants: Secondary | ICD-10-CM

## 2010-07-29 LAB — CONVERTED CEMR LAB: POC INR: 2.9

## 2010-08-04 NOTE — Letter (Signed)
Summary: [facility]: Discharge Summary  [facility]: Discharge Summary   Imported By: Earl Many 07/29/2010 16:16:22  _____________________________________________________________________  External Attachment:    Type:   Image     Comment:   External Document

## 2010-08-04 NOTE — Medication Information (Signed)
Summary: rov/ewj  Anticoagulant Therapy  Managed by: Cloyde Reams, RN, BSN Referring MD: Mariah Milling PCP: Dr. Moses Manners MD: Mariah Milling Indication 1: Atrial Fibrillation Lab Used: [facility] [place] Site: [place] INR POC 2.9 INR RANGE 2.0-3.0  Dietary changes: no    Health status changes: yes       Details: Pt complains of tremors, wanting to know if this is r/t medications.    Bleeding/hemorrhagic complications: no    Recent/future hospitalizations: no    Any changes in medication regimen? no    Recent/future dental: no  Any missed doses?: no       Is patient compliant with meds? yes      Comments: Still having SOB with activity, advised pt this is most likely r/t inactivity and deconditioning.  Will likely improve with increasing daily activity.   Allergies: 1)  ! Codeine 2)  ! Percocet 3)  ! Morphine 4)  ! Sulfa 5)  ! Persantine 6)  ! * Statins 7)  ! Iodine 8)  ! * Ivp Dye  Anticoagulation Management History:      The patient is taking warfarin and comes in today for a routine follow up visit.  Positive risk factors for bleeding include an age of 68 years or older.  The bleeding index is 'intermediate risk'.  Positive CHADS2 values include History of HTN.  Negative CHADS2 values include Age > 31 years old.  Her last INR was 1.83.  Anticoagulation responsible provider: Elayjah Chaney.  INR POC: 2.9.  Cuvette Lot#: [id number].  Exp: 06/2011.    Anticoagulation Management Assessment/Plan:      The patient's current anticoagulation dose is Coumadin 4 mg tabs: Take as directed.  The target INR is 2.0-3.0.  The next INR is due 08/19/2010.  Anticoagulation instructions were given to patient.  Results were reviewed/authorized by Cloyde Reams, RN, BSN.  She was notified by Cloyde Reams RN.         Prior Anticoagulation Instructions: INR 3.1  Skip today's dosage of Coumadin, then resume same dosage 1.5 tablets daily except 1 tablet on Fridays.  Recheck in 2  weeks.    Current Anticoagulation Instructions: INR 2.9  Continue on same dosage 1.5 tablets daily except 1 tablet on Fridays. Recheck in 3 weeks.

## 2010-08-05 ENCOUNTER — Ambulatory Visit (INDEPENDENT_AMBULATORY_CARE_PROVIDER_SITE_OTHER): Payer: Medicare Other | Admitting: Cardiovascular Disease

## 2010-08-05 ENCOUNTER — Encounter: Payer: Self-pay | Admitting: Cardiovascular Disease

## 2010-08-05 DIAGNOSIS — R197 Diarrhea, unspecified: Secondary | ICD-10-CM

## 2010-08-05 DIAGNOSIS — I4891 Unspecified atrial fibrillation: Secondary | ICD-10-CM

## 2010-08-06 ENCOUNTER — Encounter: Payer: Self-pay | Admitting: Cardiovascular Disease

## 2010-08-06 ENCOUNTER — Other Ambulatory Visit: Payer: Self-pay | Admitting: Cardiovascular Disease

## 2010-08-07 ENCOUNTER — Encounter: Payer: Self-pay | Admitting: Cardiovascular Disease

## 2010-08-07 ENCOUNTER — Telehealth (INDEPENDENT_AMBULATORY_CARE_PROVIDER_SITE_OTHER): Payer: Self-pay | Admitting: Physician Assistant

## 2010-08-07 ENCOUNTER — Encounter (INDEPENDENT_AMBULATORY_CARE_PROVIDER_SITE_OTHER): Payer: Medicare Other

## 2010-08-07 ENCOUNTER — Encounter: Payer: Self-pay | Admitting: Internal Medicine

## 2010-08-07 DIAGNOSIS — I509 Heart failure, unspecified: Secondary | ICD-10-CM

## 2010-08-07 DIAGNOSIS — I4891 Unspecified atrial fibrillation: Secondary | ICD-10-CM

## 2010-08-11 ENCOUNTER — Other Ambulatory Visit (INDEPENDENT_AMBULATORY_CARE_PROVIDER_SITE_OTHER): Payer: Medicare Other

## 2010-08-11 ENCOUNTER — Encounter: Payer: Self-pay | Admitting: Cardiovascular Disease

## 2010-08-11 DIAGNOSIS — Z79899 Other long term (current) drug therapy: Secondary | ICD-10-CM

## 2010-08-11 DIAGNOSIS — R0602 Shortness of breath: Secondary | ICD-10-CM

## 2010-08-12 LAB — CONVERTED CEMR LAB
CO2: 19 meq/L (ref 19–32)
Glucose, Bld: 110 mg/dL — ABNORMAL HIGH (ref 70–99)
Potassium: 3.8 meq/L (ref 3.5–5.3)
Sodium: 135 meq/L (ref 135–145)

## 2010-08-13 NOTE — Letter (Signed)
Summary: [facility]   Imported By: Roderic Ovens 08/03/2010 16:25:31  _____________________________________________________________________  External Attachment:    Type:   Image     Comment:   External Document

## 2010-08-13 NOTE — Letter (Signed)
SummaryScientist, physiological [facility]   [facility]   Imported By: Roderic Ovens 08/03/2010 11:08:32  _____________________________________________________________________  External Attachment:    Type:   Image     Comment:   External Document

## 2010-08-13 NOTE — Assessment & Plan Note (Signed)
Summary: F1M/AMD   Visit Type:  Follow-up Primary Provider:  Dr. Burnett Sheng  CC:  c/o shortness of breath..  History of Present Illness: [facility]  Is a pleasant 76 year old woman with a history of Atrial fibrillation, status post arteriovenous node ablation, Pacer/CRT-P, Hypertension, Paroxysmal nocturnal dyspnea, Hypothyroidism, Diastolic congestive heart failure, recent C. difficile infection treated with Flagyl for 14 days who presents for routine followup.  She reports that she continues to have significant diarrhea. She does have some crampy type pain in her abdomen and she assumed it was her colitis. She has been trying to get a appointment to see Dr. Burnett Sheng and his first available is March 12. She is asking for Lomotil as this has worked in the past. She has tried numerous over-the-counter antidiarrheal medications with no significant improvement of symptoms.  She also reports having palpitations, shortness of breath since her pacemakerwas placed. she has a fluttering in her chest that is quite bothersome. She describes it as a quivering. The shortness of breath has been quite debilitating at times. Mild improvement in her lower extremity edema and shortness of breathwith the diuretic that was restarted. We also restarted digoxin. shortness of breath continues. She reports feeling well after the first pacemaker check, not feeling well after the second pacemaker check.  EKG today shows paced rhythm at 84 beats per minute  Current Medications (verified): 1)  Prevacid 30 Mg Cpdr (Lansoprazole) .... Two Tablets Two Times A Day 2)  Synthroid 125 Mcg Tabs (Levothyroxine Sodium) .... Take 1 By Mouth Once Daily 3)  Coumadin 4 Mg Tabs (Warfarin Sodium) .... Take As Directed 4)  Symbicort 160-4.5 Mcg/act Aero (Budesonide-Formoterol Fumarate) .... Up To 2 Puffs Once Daily 5)  B Complex-B12  Tabs (B Complex Vitamins) .... Take 1 By Mouth Once Daily 6)  Fioricet 50-325-40 Mg Tabs  (Butalbital-Apap-Caffeine) .... Take Up To 2 By Mouth Up To Four Times A Day As Needed 7)  Vitamin D .... Every Week 8)  Lopressor 50 Mg Tabs (Metoprolol Tartrate) .... One Tablet Two Times A Day 9)  Cardizem Cd 240 Mg Xr24h-Cap (Diltiazem Hcl Coated Beads) .... Take 1 Tablet Once Daily 10)  Prochlorperazine Maleate 10 Mg Tabs (Prochlorperazine Maleate) .... Take One Tablet 3-4 Times A Day As Needed For Nausea. 11)  Flagyl 500 Mg Tabs (Metronidazole) .... Take One Tablet Three Times A Day X 10days 12)  Furosemide 40 Mg Tabs (Furosemide) .... Take One Tablet By Mouth Daily. 13)  Digoxin 0.125 Mg Tabs (Digoxin) .... Take One Tablet By Mouth Once Daily.  Allergies (verified): 1)  ! Codeine 2)  ! Percocet 3)  ! Morphine 4)  ! Sulfa 5)  ! Persantine 6)  ! * Statins 7)  ! Iodine 8)  ! * Ivp Dye  Past History:  Past Medical History: Last updated: 09/19/2009 1. HTN 2. Hypothyroidism 3. Hyperlipidemia: has been unable to take statins due to muscle pain.  Has tried multiple statins per her report. ' 4. GERD 5. Migraines 6. OSA: not consistently using CPAP 7. CCY  8. Left heart cath x 3 in the past, last 5 years ago.  All "normal" per her report.   9. Contrast allergy 10. Echo (4/11): EF > 55%, mild LVH, moderate LAE, normal RV, normal PA systolic pressure 11. PAD: occlusive disease involving left PT and AT.  12. Paroxysmal atrial fibrillation: Breakthrough afib on sotalol, changed to amiodarone 4/11.  DCCV 4/11.   Past Surgical History: Last updated: 07/08/2010 Pacemaker Implant  Jan.  6, 2012 Ablation Jan. 6, 2012 appendicitis gallbladder removed hysterectomy carpal tunnel surgery cardioversion  Family History: Last updated: 09/19/2009 Father with MI at age 71. Daughter with colon cancer. Mother with MI x 2.   Social History: Last updated: 09/19/2009 From [place], widowed, never smoked.  Lives alone.    Review of Systems       The patient complains of weight gain,  dyspnea on exertion, and peripheral edema.  The patient denies fever, weight loss, vision loss, decreased hearing, hoarseness, chest pain, syncope, prolonged cough, abdominal pain, incontinence, muscle weakness, depression, and enlarged lymph nodes.         chest fluttering, abdominal discomfort with diarrhea  Vital Signs:  Patient profile:   76 year old female Height:      62.5 inches Weight:      216 pounds BMI:     39.02 Pulse rate:   84 / minute BP sitting:   122 / 68  (left arm) Cuff size:   large  Vitals Entered By: Bishop Dublin, CMA (August 05, 2010 4:27 PM)  Physical Exam  General:  Well developed, well nourished, in no acute distress. Head:  normocephalic and atraumatic Neck:  Neck supple, no JVD. No masses, thyromegaly or abnormal cervical nodes. Lungs:  Clear bilaterally to auscultation and percussion. Heart:  Non-displaced PMI, chest non-tender; regular rate and rhythm, S1, S2 without murmurs, rubs or gallops. Carotid upstroke normal, no bruit.  Pedals normal pulses. Trace B/L LE edema, no varicosities. Abdomen:  Bowel sounds positive; abdomen soft and non-tender without masses Msk:  Back normal, normal gait. Muscle strength and tone normal. Pulses:  pulses normal in all 4 extremities Extremities:  No clubbing or cyanosis. Neurologic:  Alert and oriented x 3. Skin:  Intact without lesions or rashes. Psych:  Normal affect.   PPM Specifications Following MD:  Sherryl Manges, MD     PPM Vendor:  [employer]     PPM Model Number:  [id number]     PPM Serial Number:  [id number] PPM DOI:  06/12/2010     PPM Implanting MD:  Sherryl Manges, MD  Lead 1    Location: RV     DOI: 06/12/2010     Model #: [id number]     Serial #: [id number]     Status: active Lead 2    Location: LV     DOI: 06/12/2010     Model #: [id number]     Serial #: [id number]     Status: active  Magnet Response Rate:  BOL 85 ERI 65  Indications:  CHF   PPM Follow Up Pacer Dependent:  Yes      Episodes Coumadin:   Yes  Parameters Mode:  VVIR     Lower Rate Limit:  80     Upper Rate Limit:  120  Impression & Recommendations:  Problem # 1:  DIARRHEA (ICD-787.91) I am concerned that she continues to have C. difficile despite treatment with Flagyl. We will recheck a C. difficile culture. We have given her a prescription for Lomotil.  Problem # 2:  ATRIAL FIBRILLATION (ICD-427.31) she is in nature fibrillation with AV node ablation and with pacemaker. I am concerned that her pacemaker settings could be contributing to her symptoms of shortness of breath, fluttering in her chest. She has a followup pacemaker setting check this week. We will hold digoxin as she is in chronic atrial fibrillation.  The following medications were removed from the medication list:    Digoxin 0.125 Mg  Tabs (Digoxin) .Marland Kitchen... Take one tablet by mouth once daily. Her updated medication list for this problem includes:    Coumadin 4 Mg Tabs (Warfarin sodium) .Marland Kitchen... Take as directed    Lopressor 50 Mg Tabs (Metoprolol tartrate) ..... One tablet two times a day  Orders: T-Digoxin ([id number])  Problem # 3:  HYPERTENSION, BENIGN (ICD-401.1)  blood pressure is well controlled on her current medication regimen. We will check a BMP and BNP.  Her updated medication list for this problem includes:    Lopressor 50 Mg Tabs (Metoprolol tartrate) ..... One tablet two times a day    Cardizem Cd 240 Mg Xr24h-cap (Diltiazem hcl coated beads) .Marland Kitchen... Take 1 tablet once daily    Furosemide 40 Mg Tabs (Furosemide) .Marland Kitchen... Take one tablet by mouth daily.  Her updated medication list for this problem includes:    Lopressor 50 Mg Tabs (Metoprolol tartrate) ..... One tablet two times a day    Cardizem Cd 240 Mg Xr24h-cap (Diltiazem hcl coated beads) .Marland Kitchen... Take 1 tablet once daily    Furosemide 40 Mg Tabs (Furosemide) .Marland Kitchen... Take one tablet by mouth daily.  Problem # 4:  DYSPNEA (ICD-786.05) Etiology of her Shortness of breath  is uncertain. I Am  concerned that it could be from her pacemaker settings. we will check her BNP, BMP. Continue Lasix. Check her pacemaker settings this Friday.  The following medications were removed from the medication list:    Digoxin 0.125 Mg Tabs (Digoxin) .Marland Kitchen... Take one tablet by mouth once daily. Her updated medication list for this problem includes:    Lopressor 50 Mg Tabs (Metoprolol tartrate) ..... One tablet two times a day    Cardizem Cd 240 Mg Xr24h-cap (Diltiazem hcl coated beads) .Marland Kitchen... Take 1 tablet once daily    Furosemide 40 Mg Tabs (Furosemide) .Marland Kitchen... Take one tablet by mouth daily.  Orders: T-Basic Metabolic Panel [id number]) T-BNP  (B Natriuretic Peptide) [id number])  The following medications were removed from the medication list:    Digoxin 0.125 Mg Tabs (Digoxin) .Marland Kitchen... Take one tablet by mouth once daily. Her updated medication list for this problem includes:    Lopressor 50 Mg Tabs (Metoprolol tartrate) ..... One tablet two times a day    Cardizem Cd 240 Mg Xr24h-cap (Diltiazem hcl coated beads) .Marland Kitchen... Take 1 tablet once daily    Furosemide 40 Mg Tabs (Furosemide) .Marland Kitchen... Take one tablet by mouth daily.  Other Orders: T-Culture, C-Diff Toxin A/B ([id number])  Patient Instructions: 1)  Your physician recommends that you schedule a follow-up appointment in: 1 month. 2)  Your physician recommends that you return for lab work in: 3)  August 06, 2010. 4)  Your physician has recommended you make the following change in your medication: Start on Lomotil take 1-2 tablets four times a day as needed. Stop Digoxin. Prescriptions: LOMOTIL 2.5-0.025 MG TABS (DIPHENOXYLATE-ATROPINE) take 1-2 tablets four times day as needed for diarrhea.  #120 x 1   Entered by:   Bishop Dublin, CMA   Authorized by:   [name] MD   Signed by:   Bishop Dublin, CMA on 08/05/2010   Method used:   Print then Give to Patient   RxID:   [id number]

## 2010-08-13 NOTE — Progress Notes (Addendum)
  Phone Note From Other Clinic   Call For: Dr [name] Summary of Call: Lab called because C. diff was positive. Spoke w/ pt and called in Flagyl 500mg  three times a day for 14 days. Pt aware and will pick up.  Initial call taken by: Park Breed PA-C,  August 07, 2010 5:17 PM     Appended Document:  She did not clear c.diff with my last script of flagyl. Will try vanco 125 mg by mouth QID Megan, can you talk with her and call in script. Thx.  Appended Document: Vanc for C diff Spoke to pt, told her to stop Flagyl and start Vancomycin125mg  QID for 10 days per Dr. Mariah Milling. Pt will start this today. Pt will call office when completed.   Clinical Lists Changes  Medications: Added new medication of VANCOCIN HCL 125 MG CAPS (VANCOMYCIN HCL) Take one tablet by mouth four times a day for 10 days. - Signed Rx of VANCOCIN HCL 125 MG CAPS (VANCOMYCIN HCL) Take one tablet by mouth four times a day for 10 days.;  #40 x 0;  Signed;  Entered by: [name] RN;  Authorized by: [name] MD;  Method used: Electronically to [id number] [address], [place], [place]  [address], Ph: [phone], Fax: [phone]    Prescriptions: VANCOCIN HCL 125 MG CAPS (VANCOMYCIN HCL) Take one tablet by mouth four times a day for 10 days.  #40 x 0   Entered by:   [name] RN   Authorized by:   [name] MD   Signed by:   [name] RN on 08/14/2010   Method used:   Electronically to        [id number] (retail)       [address]       [place], [place]  [address]       Ph: [phone]       Fax: [phone]   RxID:   [id number]

## 2010-08-13 NOTE — Consult Note (Signed)
SummaryScientist, physiological [facility]   [facility]   Imported By: Roderic Ovens 08/03/2010 11:02:28  _____________________________________________________________________  External Attachment:    Type:   Image     Comment:   External Document

## 2010-08-13 NOTE — Consult Note (Signed)
SummaryScientist, physiological [facility]   [facility]   Imported By: Roderic Ovens 08/03/2010 10:36:34  _____________________________________________________________________  External Attachment:    Type:   Image     Comment:   External Document

## 2010-08-17 ENCOUNTER — Telehealth: Payer: Self-pay | Admitting: Cardiovascular Disease

## 2010-08-18 ENCOUNTER — Telehealth: Payer: Self-pay | Admitting: Cardiovascular Disease

## 2010-08-18 NOTE — Procedures (Signed)
Summary: PACER/AMD/NR   Current Medications (verified): 1)  Prevacid 30 Mg Cpdr (Lansoprazole) .... Two Tablets Two Times A Day 2)  Synthroid 125 Mcg Tabs (Levothyroxine Sodium) .... Take 1 By Mouth Once Daily 3)  Coumadin 4 Mg Tabs (Warfarin Sodium) .... Take As Directed 4)  Symbicort 160-4.5 Mcg/act Aero (Budesonide-Formoterol Fumarate) .... Up To 2 Puffs Once Daily 5)  B Complex-B12  Tabs (B Complex Vitamins) .... Take 1 By Mouth Once Daily 6)  Fioricet 50-325-40 Mg Tabs (Butalbital-Apap-Caffeine) .... Take Up To 2 By Mouth Up To Four Times A Day As Needed 7)  Vitamin D .... Every Week 8)  Lopressor 50 Mg Tabs (Metoprolol Tartrate) .... One Tablet Two Times A Day 9)  Cardizem Cd 240 Mg Xr24h-Cap (Diltiazem Hcl Coated Beads) .... Take 1 Tablet Once Daily 10)  Prochlorperazine Maleate 10 Mg Tabs (Prochlorperazine Maleate) .... Take One Tablet 3-4 Times A Day As Needed For Nausea. 11)  Furosemide 40 Mg Tabs (Furosemide) .... Take One Tablet By Mouth Daily. 12)  Lomotil 2.5-0.025 Mg Tabs (Diphenoxylate-Atropine) .... Take 1-2 Tablets Four Times Day As Needed For Diarrhea.  Allergies (verified): 1)  ! Codeine 2)  ! Percocet 3)  ! Morphine 4)  ! Sulfa 5)  ! Persantine 6)  ! * Statins 7)  ! Iodine 8)  ! * Ivp Dye   PPM Specifications Following MD:  Sherryl Manges, MD     PPM Vendor:  [employer]     PPM Model Number:  [id number]     PPM Serial Number:  [id number] PPM DOI:  06/12/2010     PPM Implanting MD:  Sherryl Manges, MD  Lead 1    Location: RV     DOI: 06/12/2010     Model #: [id number]     Serial #: [id number]     Status: active Lead 2    Location: [facility]     DOI: 06/12/2010     Model #: [id number]     Serial #: [id number]     Status: active  Magnet Response Rate:  BOL 85 ERI 65  Indications:  CHF   PPM Follow Up Pacer Dependent:  Yes      Episodes Coumadin:  Yes  Parameters Mode:  VVIR     Lower Rate Limit:  80     Upper Rate Limit:  120 Tech Comments:  See PaceArt

## 2010-08-19 ENCOUNTER — Encounter: Payer: Self-pay | Admitting: Cardiology

## 2010-08-19 ENCOUNTER — Encounter (INDEPENDENT_AMBULATORY_CARE_PROVIDER_SITE_OTHER): Payer: Medicare Other

## 2010-08-19 DIAGNOSIS — Z7901 Long term (current) use of anticoagulants: Secondary | ICD-10-CM

## 2010-08-19 DIAGNOSIS — I4891 Unspecified atrial fibrillation: Secondary | ICD-10-CM

## 2010-08-25 NOTE — Progress Notes (Signed)
Summary: [name]  ---- Converted from flag ---- Scheduled pt to see Dr. Burnett Sheng this week, she will continue Flagyl until this appt, pt aware.  ---- 08/16/2010 8:13 PM, [name] MD wrote: Would contact [facility] office and ask them to help manage. She needs either appt ASAP or management over the phone. Would continue flagyl until we get this straightened out Thx. tim  ---- 08/14/2010 12:17 PM, [name] RN wrote: Pt went to pick up Vanc at pharmacy, price it outrageous ($1,000) even through insurance. I know that Dr. Leavy Cella can do IV abx treatments, do you want me to send her there for IV Zenaida Niece? Otherwise I don't know how patient can get other than at hospital. ------------------------------   Spoke to pt, she states Dr. Burnett Sheng has referred her to Dr. Loreta Ave (she has seen before) GI. Pt will continue Flagyl until this appt. Pt also c/o SOB. In previous notes this has been related to her pacemaker. Pt is holding lasix at this time. She denies any swelling, however, told pt she can take lasix 40mg  once daily for now until pacemaker is checked. [name] saw pt last week and made changes to her settings. Left voicemail with [name] ([employer] rep) to possibly see pt this week in office to check PPM settings. Instructed pt to call if SOB worsens in meantime. Pt ok with this.

## 2010-08-25 NOTE — Progress Notes (Signed)
Summary: SOB/pacemaker  Phone Note Outgoing Call   Call placed by: Aundra Millet, RN Call placed to: Patient Summary of Call: Called pt, notified her that [name] ([employer] Rep) will come out tomorrow 08/19/10 to see pt (pt has coumadin visit that day @ 3:45pm). [name] will assess pt's pacemaker due to her symptoms of SOB since pacemaker placement. Pt this AM states her SOB has not changed from yesterday, however, she does have mild swelling in BLE ankles only; she states she did walk yesterday more than usual due to Dr. Tyrell Antonio. Advised pt take one dose of lasix 40mg  once today, pt has been off lasix recently. Pt states she will do this and she will call office if SOB worsens prior to her visit tomorrow. Initial call taken by: [name] RN,  August 18, 2010 9:49 AM

## 2010-08-25 NOTE — Cardiovascular Report (Signed)
Summary: Office Visit   Office Visit   Imported By: Roderic Ovens 08/17/2010 14:54:11  _____________________________________________________________________  External Attachment:    Type:   Image     Comment:   External Document

## 2010-08-25 NOTE — Medication Information (Signed)
Summary: rov/ewj  Anticoagulant Therapy  Managed by: Cloyde Reams, RN, BSN Referring MD: Mariah Milling PCP: Dr. Moses Manners MD: Shirlee Latch MD, Ashlyn Cabler Indication 1: Atrial Fibrillation Lab Used: [facility] Site: [place] INR POC 2.4 INR RANGE 2.0-3.0  Dietary changes: no    Health status changes: no    Bleeding/hemorrhagic complications: no    Recent/future hospitalizations: no    Any changes in medication regimen? no    Recent/future dental: no  Any missed doses?: no       Is patient compliant with meds? yes       Allergies: 1)  ! Codeine 2)  ! Percocet 3)  ! Morphine 4)  ! Sulfa 5)  ! Persantine 6)  ! * Statins 7)  ! Iodine 8)  ! * Ivp Dye  Anticoagulation Management History:      The patient is taking warfarin and comes in today for a routine follow up visit.  Positive risk factors for bleeding include an age of 76 years or older.  The bleeding index is 'intermediate risk'.  Positive CHADS2 values include History of HTN.  Negative CHADS2 values include Age > 3 years old.  Her last INR was 1.83.  Anticoagulation responsible provider: Shirlee Latch MD, Milicent Acheampong.  INR POC: 2.4.  Cuvette Lot#: [id number].  Exp: 06/2011.    Anticoagulation Management Assessment/Plan:      The patient's current anticoagulation dose is Coumadin 4 mg tabs: Take as directed.  The target INR is 2.0-3.0.  The next INR is due 09/09/2010.  Anticoagulation instructions were given to patient.  Results were reviewed/authorized by Cloyde Reams, RN, BSN.  She was notified by Cloyde Reams RN.         Prior Anticoagulation Instructions: INR 2.9  Continue on same dosage 1.5 tablets daily except 1 tablet on Fridays. Recheck in 3 weeks.   Current Anticoagulation Instructions: INR 2.4  Continue on same dosage 1.5 tablets daily except 1 tablet on Fridays.  Recheck in 4 weeks.

## 2010-08-26 ENCOUNTER — Encounter: Payer: Self-pay | Admitting: Cardiovascular Disease

## 2010-08-26 ENCOUNTER — Other Ambulatory Visit: Payer: Self-pay | Admitting: Cardiovascular Disease

## 2010-08-26 DIAGNOSIS — Z7901 Long term (current) use of anticoagulants: Secondary | ICD-10-CM | POA: Insufficient documentation

## 2010-08-26 DIAGNOSIS — Z950 Presence of cardiac pacemaker: Secondary | ICD-10-CM

## 2010-08-26 DIAGNOSIS — R0602 Shortness of breath: Secondary | ICD-10-CM

## 2010-08-26 DIAGNOSIS — Z9889 Other specified postprocedural states: Secondary | ICD-10-CM

## 2010-08-26 DIAGNOSIS — I4891 Unspecified atrial fibrillation: Secondary | ICD-10-CM

## 2010-09-09 ENCOUNTER — Ambulatory Visit (INDEPENDENT_AMBULATORY_CARE_PROVIDER_SITE_OTHER): Payer: Medicare Other | Admitting: Emergency Medicine

## 2010-09-09 DIAGNOSIS — Z7901 Long term (current) use of anticoagulants: Secondary | ICD-10-CM

## 2010-09-09 DIAGNOSIS — I4891 Unspecified atrial fibrillation: Secondary | ICD-10-CM

## 2010-09-09 NOTE — Patient Instructions (Signed)
Continue on same dosage 1.5 tablets daily except 1 tablet on Fridays.  Recheck in 4 weeks.

## 2010-09-10 ENCOUNTER — Ambulatory Visit (INDEPENDENT_AMBULATORY_CARE_PROVIDER_SITE_OTHER): Payer: Medicare Other | Admitting: *Deleted

## 2010-09-10 ENCOUNTER — Other Ambulatory Visit: Payer: Medicare Other | Admitting: *Deleted

## 2010-09-10 DIAGNOSIS — R0602 Shortness of breath: Secondary | ICD-10-CM

## 2010-09-10 DIAGNOSIS — Z950 Presence of cardiac pacemaker: Secondary | ICD-10-CM

## 2010-09-10 DIAGNOSIS — R5382 Chronic fatigue, unspecified: Secondary | ICD-10-CM

## 2010-09-10 DIAGNOSIS — Z9889 Other specified postprocedural states: Secondary | ICD-10-CM

## 2010-09-14 ENCOUNTER — Other Ambulatory Visit: Payer: Self-pay | Admitting: Emergency Medicine

## 2010-09-14 MED ORDER — FUROSEMIDE 40 MG PO TABS: 40.0000 mg | ORAL_TABLET | Freq: Every day | ORAL | Status: DC

## 2010-09-28 ENCOUNTER — Other Ambulatory Visit: Payer: Self-pay | Admitting: *Deleted

## 2010-09-28 DIAGNOSIS — I519 Heart disease, unspecified: Secondary | ICD-10-CM

## 2010-09-29 ENCOUNTER — Ambulatory Visit (INDEPENDENT_AMBULATORY_CARE_PROVIDER_SITE_OTHER): Payer: Medicare Other | Admitting: *Deleted

## 2010-09-29 DIAGNOSIS — R0989 Other specified symptoms and signs involving the circulatory and respiratory systems: Secondary | ICD-10-CM

## 2010-09-29 DIAGNOSIS — R0602 Shortness of breath: Secondary | ICD-10-CM

## 2010-10-07 ENCOUNTER — Ambulatory Visit (INDEPENDENT_AMBULATORY_CARE_PROVIDER_SITE_OTHER): Payer: Medicare Other | Admitting: Emergency Medicine

## 2010-10-07 DIAGNOSIS — Z7901 Long term (current) use of anticoagulants: Secondary | ICD-10-CM

## 2010-10-07 DIAGNOSIS — I4891 Unspecified atrial fibrillation: Secondary | ICD-10-CM

## 2010-10-07 LAB — POCT INR: INR: 1.6

## 2010-10-21 ENCOUNTER — Ambulatory Visit (INDEPENDENT_AMBULATORY_CARE_PROVIDER_SITE_OTHER): Payer: Medicare Other | Admitting: Emergency Medicine

## 2010-10-21 DIAGNOSIS — Z7901 Long term (current) use of anticoagulants: Secondary | ICD-10-CM

## 2010-10-21 DIAGNOSIS — I4891 Unspecified atrial fibrillation: Secondary | ICD-10-CM

## 2010-10-21 LAB — POCT INR: INR: 1.7

## 2010-10-21 MED ORDER — WARFARIN SODIUM 4 MG PO TABS: ORAL_TABLET | ORAL | Status: DC

## 2010-10-23 NOTE — Op Note (Signed)
[name], [name]             ACCOUNT NO.:  [id number]   MEDICAL RECORD NO.:  [id number]          PATIENT TYPE:  INP   LOCATION:  2918                         FACILITY:  [facility]   PHYSICIAN:  Anselmo Rod, M.D.  DATE OF BIRTH:  12/13/1934   DATE OF PROCEDURE:  02/23/2006  DATE OF DISCHARGE:  02/26/2006                                 OPERATIVE REPORT   Ms. Jacqueline Lozano was scheduled for a colonoscopy to further evaluate her  change in bowel habits.  On arrival to the endoscopy unit she was found to  have a heart rate of 160-180 b.p.m. and was found to be in atrial  fibrillation with rapid ventricular response.  She also hypotensive at  180/150.  A Cardiology consult was procured from Dr. Mat Carne., [facility], and plans were to admit the patient for IV heparin and IV Cardizem  treatment.  Further recommendations will be made after we monitor her  response to these drugs.  She may have a possible flexible sigmoidoscopy  with Dr. Elnoria Howard tomorrow if her condition stabilizes.      Anselmo Rod, M.D.  Electronically Signed     JNM/MEDQ  D:  02/24/2006  T:  02/27/2006  Job:  [id number]   cc:   Olene Craven, M.D.

## 2010-10-23 NOTE — Discharge Summary (Signed)
[name], Lozano             ACCOUNT NO.:  [id number]   MEDICAL RECORD NO.:  [id number]          PATIENT TYPE:  INP   LOCATION:  2918                         FACILITY:  [facility]   PHYSICIAN:  Lezlie Octave, N.P.     DATE OF BIRTH:  06/13/1934   DATE OF ADMISSION:  02/23/2006  DATE OF DISCHARGE:                                 DISCHARGE SUMMARY   HISTORY OF PRESENT ILLNESS:  Jacqueline Lozano is a 76 year old female who  apparently was having a colonoscopy, her rate was noted to be 150-180, and  she was in atrial fib.  She has a history of PAF, she used to be on  amiodarone.  It was stopped in August 2007 secondary to elevated TSH.  She  has a history of normal coronary arteries in 2002 with a negative Cardiolite  in December 2005.  She also has dyslipidemia, obesity with obstructive sleep  apnea, she wears a CPAP, Barrett's esophagus.  She was seen by Dr. Tresa Endo in  the endoscopy suite.  She was put on IV Cardizem with a bolus of IV Heparin.  Dr. Tresa Endo decided to check a 2D echo, if it was normal, he would start  sotalol or Rythmol.  Her heart rate continued to be in 180, her blood  pressure was 221/119.  Thus, she was put on other p.o. blood pressure  medications.  She was also on high dose Toprol at home and also Cardizem.  The following morning she was ordered her sotalol, however, her blood  pressure was down to 122/54 to 100/50.  She had already received her Toprol  so it was not started until that evening.  Her blood pressure came down all  the way to the 80's so the Cardizem IV was discontinued.  Her Toprol was  decreased then to 50 mg a day, and her sotalol was started later that  evening.  On February 25, 2006 she was in normal sinus rhythm.  She was  seen by cardiac rehab.  She was well.  She was started on Heparin to  Coumadin.  On February 26, 2006 she continued to be in sinus rhythm, her  blood pressure ranged from 130/60 to 108/58, heart rate 62-74.  It was  decided to  switch her to Lovenox, to teach her to give her injections.  She  was agreeable with this so that she could go home.  This was all  accomplished and her insurance will pay for her Lovenox.  Thus, she is  discharged home.  Also, to note, Dr. Loreta Ave came to see her.  Her biopsies  were negative and she was having 2-3 watery BM's and they recommended  Lomotil for her diarrhea, and to followup with Dr. Loreta Ave in 1-2 weeks.   LABORATORY DATA:  Sodium 137, potassium 4.2, BUN was 14, creatinine was 1.0,  magnesium was 2.2, INR on February 26, 2006 was 2.2, hemoglobin was 11.1,  hematocrit 31.5, wbc's 11.0, platelets 220, TSH was 4.865.   DISCHARGE MEDICATIONS:  1. Prevacid as taken previously, she was on it twice a day.  2. Aspirin 81 mg 1  time per day.  3. Synthroid 50 mcg 1 time per day.  4. Diovan 80 mg 1 time per day.  5. Sotalol 80 mg 2 times per day.  6. Toprol-XL 50 mg 1 time per day.  7. Coumadin 5 mg at about 6:00 p.m. every night.  8. Lovenox 100 mg subcu every 12 hours.   FOLLOWUP:  She will return to see Dr. Tresa Endo in 2 weeks.  Previously had seen  Dr. Elsie Lincoln, she decided to switch.  She will call our office on Monday to  get an appointment.  She will have her proton INR checked on Monday and  Wednesday in the lab of her choice.  She was given a prescription to get  this done.  She does live in [place] and I will call her with a  [place] number to see if she wants to followup in the [place] office.  If we do not call her within 2 days of her blood being drawn, she should  give our office a call.  She has no activity restrictions at home.   DISCHARGE DIAGNOSIS:  1. Paroxysmal atrial fibrillation (PAF) with a rapid ventricular response.      Previously on Amiodarone, was dc 'd in August of 2007 secondary to      elevated TSH, now on sotalol.  2. History of normal coronaries and normal LV function.  3. Morbid obesity.  4. Obstructive sleep apnea, wears a CPAP.  5.  Hypertension.  6. Status post colonoscopy with negative biopsies.  7. History of diarrhea, recommended to take Imodium or Lomotil.      Lezlie Octave, N.P.     BB/MEDQ  D:  02/26/2006  T:  02/26/2006  Job:  [id number]   cc:   Nicki Guadalajara, M.D.  Olene Craven, M.D.  Anselmo Rod, M.D.

## 2010-10-26 ENCOUNTER — Other Ambulatory Visit: Payer: Self-pay | Admitting: Emergency Medicine

## 2010-10-26 MED ORDER — METOPROLOL TARTRATE 100 MG PO TABS: 100.0000 mg | ORAL_TABLET | Freq: Two times a day (BID) | ORAL | Status: DC

## 2010-10-26 MED ORDER — DILTIAZEM HCL ER COATED BEADS 240 MG PO CP24: 240.0000 mg | ORAL_CAPSULE | Freq: Two times a day (BID) | ORAL | Status: DC

## 2010-10-28 ENCOUNTER — Other Ambulatory Visit: Payer: Self-pay

## 2010-10-28 MED ORDER — DILTIAZEM HCL ER COATED BEADS 240 MG PO CP24: 240.0000 mg | ORAL_CAPSULE | Freq: Every day | ORAL | Status: DC

## 2010-10-28 NOTE — Telephone Encounter (Signed)
Jacqueline Lozano would like to verify the dose on cardizem cd 240 mg one tablet twice a day or one tablet daily.    Notified pharmacy patient needs to be on the cardizem cd 240 mg one tablet daily per Dr. Mariah Milling. New Rx sent to [facility]. The patient will monitor blood pressure and call if any problems.

## 2010-11-04 ENCOUNTER — Ambulatory Visit (INDEPENDENT_AMBULATORY_CARE_PROVIDER_SITE_OTHER): Payer: Medicare Other | Admitting: Emergency Medicine

## 2010-11-04 DIAGNOSIS — I4891 Unspecified atrial fibrillation: Secondary | ICD-10-CM

## 2010-11-04 DIAGNOSIS — Z7901 Long term (current) use of anticoagulants: Secondary | ICD-10-CM

## 2010-11-04 LAB — POCT INR: INR: 1.9

## 2010-11-17 ENCOUNTER — Encounter: Payer: Self-pay | Admitting: Internal Medicine

## 2010-11-18 ENCOUNTER — Ambulatory Visit (INDEPENDENT_AMBULATORY_CARE_PROVIDER_SITE_OTHER): Payer: Medicare Other | Admitting: Emergency Medicine

## 2010-11-18 DIAGNOSIS — Z7901 Long term (current) use of anticoagulants: Secondary | ICD-10-CM

## 2010-11-18 DIAGNOSIS — I4891 Unspecified atrial fibrillation: Secondary | ICD-10-CM

## 2010-11-23 ENCOUNTER — Ambulatory Visit (INDEPENDENT_AMBULATORY_CARE_PROVIDER_SITE_OTHER): Payer: Medicare Other | Admitting: Internal Medicine

## 2010-11-23 ENCOUNTER — Encounter: Payer: Self-pay | Admitting: Internal Medicine

## 2010-11-23 DIAGNOSIS — I442 Atrioventricular block, complete: Secondary | ICD-10-CM

## 2010-11-23 DIAGNOSIS — I4891 Unspecified atrial fibrillation: Secondary | ICD-10-CM

## 2010-11-23 DIAGNOSIS — Z950 Presence of cardiac pacemaker: Secondary | ICD-10-CM | POA: Insufficient documentation

## 2010-11-23 DIAGNOSIS — I495 Sick sinus syndrome: Secondary | ICD-10-CM

## 2010-11-23 DIAGNOSIS — R0602 Shortness of breath: Secondary | ICD-10-CM

## 2010-11-23 DIAGNOSIS — I509 Heart failure, unspecified: Secondary | ICD-10-CM

## 2010-11-23 DIAGNOSIS — I428 Other cardiomyopathies: Secondary | ICD-10-CM

## 2010-11-23 HISTORY — DX: Atrioventricular block, complete: I44.2

## 2010-11-23 HISTORY — DX: Presence of cardiac pacemaker: Z95.0

## 2010-11-23 NOTE — Assessment & Plan Note (Signed)
The patient's device was interrogated and the information was fully reviewed.  The device was reprogrammed to increase a lower rate to 75 and modifications of the rate response included 1) changing threshold for medium-low to medium-high 2) changing the  ADL from 100-90 3) we programmed her deceleration to 2.5 minutes. With this she is able to walk 100 feet compared to 10 feet

## 2010-11-23 NOTE — Patient Instructions (Signed)
You are doing well. No medication changes were made. Please call us if you have new issues that need to be addressed before your next appt.  We will call you for a follow up Appt. In 6 months  

## 2010-11-23 NOTE — Assessment & Plan Note (Signed)
As above. Initially her breathing she said was no worse but no better from before the procedure. As noted above improved to 100 from about 10 feet

## 2010-11-23 NOTE — Assessment & Plan Note (Signed)
[place] on Coumadin

## 2010-11-23 NOTE — Assessment & Plan Note (Signed)
Stable post pacing; there is no intrinsic rhythm

## 2010-11-23 NOTE — Progress Notes (Signed)
HPI  Jacqueline Lozano is a 76 y.o. female seen with her daughter following  AV ablation in  for control  of paroxysmal atrial fibrillation with rapic ventricular response; at the same time (January 2012) she underwent CRT P. Implantation.  In the past she has failed sotalol amiodarone and multiple   rate controlling meds She remains SOB but without orthopnea or peripheral edema. Her complaint is noticed when she gets up and walks. She feels like her heartbeat is going very fast.  Past Medical History  Diagnosis Date  . Hypertension   . Hypothyroidism   . GERD (gastroesophageal reflux disease)   . Migraine   . OSA (obstructive sleep apnea)     Not consistently using CPAP  . Allergic to IV contrast   . PAD (peripheral artery disease)     Occlusive disease involving left PT and AT  . Hyperlipidemia     Has been unable to take statins due to muscle pain. Has tried multiple statins per her report.  . Abnormal echocardiogram 09/2009    EF >55%, mild LVH, moderate LAE, normal RV, normal PA systolic pressure  . Paroxysmal atrial fibrillation     Breakthrough a fib on sotalol, changed to amiodarone 4/11. DCCV 4/11    Past Surgical History  Procedure Date  . Cardiac catheterization     Left heart cath x3 in teh past, last 5 yrs ago. All "normal" per pt report  . Pacemaker insertion 06/12/2010  . Atrial ablation surgery 06/12/2010  . Appendectomy   . Cholecystectomy open   . Vesicovaginal fistula closure w/ tah   . Carpal tunnel release   . Cardioversion     Current Outpatient Prescriptions  Medication Sig Dispense Refill  . B-Complex TABS Take 1 tablet by mouth daily.        . budesonide-formoterol (SYMBICORT) 160-4.5 MCG/ACT inhaler Inhale 2 puffs into the lungs daily.        . butalbital-acetaminophen-caffeine (FIORICET WITH CODEINE) 50-325-40-30 MG per capsule Take 1 capsule by mouth every 4 (four) hours as needed.        . cholestyramine (QUESTRAN) 4 GM/DOSE powder Take 4 g by  mouth 2 (two) times daily with a meal.        . diltiazem (CARDIZEM CD) 240 MG 24 hr capsule Take 1 capsule (240 mg total) by mouth daily.  30 capsule  6  . diphenoxylate-atropine (LOMOTIL) 2.5-0.025 MG per tablet Take 1 tablet by mouth 4 (four) times daily as needed.        . ergocalciferol (VITAMIN D2) 50000 UNITS capsule Take 50,000 Units by mouth once a week.        . lansoprazole (PREVACID) 30 MG capsule Take 60 mg by mouth 2 (two) times daily.        Marland Kitchen levothyroxine (SYNTHROID, LEVOTHROID) 125 MCG tablet Take 125 mcg by mouth daily.        Marland Kitchen losartan (COZAAR) 100 MG tablet Take 100 mg by mouth daily.        . metoprolol (LOPRESSOR) 100 MG tablet Take 100 mg by mouth 2 (two) times daily. Pt is taking 1.5 tablets bid       . omeprazole (PRILOSEC) 20 MG capsule Take 20 mg by mouth 2 (two) times daily.        . prochlorperazine (COMPAZINE) 10 MG tablet Take 10 mg by mouth every 6 (six) hours as needed.        . saccharomyces boulardii (FLORASTOR) 250 MG capsule Take 250 mg  by mouth 2 (two) times daily.        . vancomycin (VANCOCIN) 125 MG capsule Take 125 mg by mouth 4 (four) times daily.        Marland Kitchen warfarin (COUMADIN) 4 MG tablet Take as directed by Anticoagulation clinic  60 tablet  3  . furosemide (LASIX) 40 MG tablet Take 40 mg by mouth daily.         Allergies  Allergen Reactions  . Codeine   . Dipyridamole   . Iodine   . Morphine   . Oxycodone-Acetaminophen   . Statins   . Sulfonamide Derivatives     Review of Systems negative except from HPI and PMH  Physical Exam Well developed and morbidly obese older Caucasian female in no acute distress HENT normal E scleral and icterus clear Neck Supple JVP flat; carotids brisk and full Clear to ausculation Pacemaker pocket well-healed Regular rate and rhythm, no murmurs gallops or rub Soft with active bowel sounds No clubbing cyanosis and edema Alert and oriented, grossly normal motor and sensory function Skin Warm and  Dry  ECG atrial fibrillation QRS duration 132 ms with an upright complex in lead V1  Assessment and  Plan

## 2010-12-02 ENCOUNTER — Encounter: Payer: Medicare Other | Admitting: Emergency Medicine

## 2010-12-08 ENCOUNTER — Other Ambulatory Visit (INDEPENDENT_AMBULATORY_CARE_PROVIDER_SITE_OTHER): Payer: Medicare Other | Admitting: *Deleted

## 2010-12-08 DIAGNOSIS — Z7901 Long term (current) use of anticoagulants: Secondary | ICD-10-CM

## 2010-12-08 DIAGNOSIS — I4891 Unspecified atrial fibrillation: Secondary | ICD-10-CM

## 2010-12-08 LAB — POCT INR: INR: 2

## 2010-12-23 ENCOUNTER — Ambulatory Visit (INDEPENDENT_AMBULATORY_CARE_PROVIDER_SITE_OTHER): Payer: Medicare Other | Admitting: Emergency Medicine

## 2010-12-23 DIAGNOSIS — I4891 Unspecified atrial fibrillation: Secondary | ICD-10-CM

## 2010-12-23 LAB — POCT INR: INR: 2.1

## 2010-12-28 ENCOUNTER — Other Ambulatory Visit: Payer: Self-pay | Admitting: Cardiovascular Disease

## 2010-12-28 MED ORDER — WARFARIN SODIUM 4 MG PO TABS: ORAL_TABLET | ORAL | Status: DC

## 2010-12-28 NOTE — Telephone Encounter (Signed)
Coumadin dosage has been changed and the patient will run out of her medication.

## 2011-01-13 ENCOUNTER — Ambulatory Visit (INDEPENDENT_AMBULATORY_CARE_PROVIDER_SITE_OTHER): Payer: Medicare Other | Admitting: Emergency Medicine

## 2011-01-13 DIAGNOSIS — Z7901 Long term (current) use of anticoagulants: Secondary | ICD-10-CM

## 2011-01-13 DIAGNOSIS — I4891 Unspecified atrial fibrillation: Secondary | ICD-10-CM

## 2011-01-13 LAB — POCT INR: INR: 2.5

## 2011-01-13 MED ORDER — WARFARIN SODIUM 4 MG PO TABS: ORAL_TABLET | ORAL | Status: DC

## 2011-02-10 ENCOUNTER — Ambulatory Visit: Payer: Self-pay | Admitting: Family Medicine

## 2011-02-10 ENCOUNTER — Ambulatory Visit (INDEPENDENT_AMBULATORY_CARE_PROVIDER_SITE_OTHER): Payer: Medicare Other | Admitting: Emergency Medicine

## 2011-02-10 ENCOUNTER — Encounter: Payer: Medicare Other | Admitting: Emergency Medicine

## 2011-02-10 DIAGNOSIS — I4891 Unspecified atrial fibrillation: Secondary | ICD-10-CM

## 2011-02-10 DIAGNOSIS — Z7901 Long term (current) use of anticoagulants: Secondary | ICD-10-CM

## 2011-02-10 LAB — POCT INR: INR: 1.9

## 2011-02-26 ENCOUNTER — Telehealth: Payer: Self-pay

## 2011-02-26 DIAGNOSIS — Z79899 Other long term (current) drug therapy: Secondary | ICD-10-CM

## 2011-02-26 DIAGNOSIS — R0602 Shortness of breath: Secondary | ICD-10-CM

## 2011-02-26 MED ORDER — FUROSEMIDE 40 MG PO TABS: 40.0000 mg | ORAL_TABLET | Freq: Every day | ORAL | Status: DC

## 2011-02-26 MED ORDER — POTASSIUM CHLORIDE ER 10 MEQ PO TBCR: 20.0000 meq | EXTENDED_RELEASE_TABLET | ORAL | Status: DC

## 2011-02-26 NOTE — Telephone Encounter (Signed)
Having difficulty breathing with little exertion.  She can't even walk to her car without getting shortness of breath.  She does have chest pain that comes and goes which started two weeks ago.  She has taken gavisom which does relieve the chest pain.  Please advise what to do.

## 2011-02-26 NOTE — Telephone Encounter (Signed)
Per Dr. Mariah Milling have patient take a fluid pill if she is not already along with potassium.  The patient states she has not had a fluid pill in a while.  I will send a new Rx for potassium 20 meq take one tablet daily along with furosemide 40 mg one tablet daily.  The patient will call on Monday to let us know if the shortness of breath any better. The patient will call the office if has any worsening symptoms.  The patient will also have a BNP & BMET on Wed.

## 2011-03-03 ENCOUNTER — Ambulatory Visit (INDEPENDENT_AMBULATORY_CARE_PROVIDER_SITE_OTHER): Payer: Medicare Other | Admitting: Emergency Medicine

## 2011-03-03 ENCOUNTER — Other Ambulatory Visit: Payer: Self-pay

## 2011-03-03 ENCOUNTER — Ambulatory Visit: Payer: Medicare Other | Admitting: *Deleted

## 2011-03-03 ENCOUNTER — Encounter: Payer: Self-pay | Admitting: *Deleted

## 2011-03-03 DIAGNOSIS — Z79899 Other long term (current) drug therapy: Secondary | ICD-10-CM

## 2011-03-03 DIAGNOSIS — R0602 Shortness of breath: Secondary | ICD-10-CM

## 2011-03-03 DIAGNOSIS — I4891 Unspecified atrial fibrillation: Secondary | ICD-10-CM

## 2011-03-03 DIAGNOSIS — Z7901 Long term (current) use of anticoagulants: Secondary | ICD-10-CM

## 2011-03-03 LAB — BASIC METABOLIC PANEL
Calcium: 9.3 mg/dL (ref 8.4–10.5)
Potassium: 4.3 mEq/L (ref 3.5–5.3)
Sodium: 137 mEq/L (ref 135–145)

## 2011-03-03 LAB — POCT INR: INR: 1.9

## 2011-03-04 LAB — BRAIN NATRIURETIC PEPTIDE: Brain Natriuretic Peptide: 102.7 pg/mL — ABNORMAL HIGH (ref 0.0–100.0)

## 2011-03-12 ENCOUNTER — Telehealth: Payer: Self-pay | Admitting: Cardiovascular Disease

## 2011-03-12 NOTE — Telephone Encounter (Signed)
Spoke to pt, she feels worsening SOB. Per last note we had her take lasix 40mg  qd, and BMP showed creat 0.98 and K 4.3. Pt, however, states that she has been drinking "like its going out of style," after asking about PO intake. Per TG, advised pt take extra 40mg  lasix today only (with extra K), then resume 40mg  daily, and DECREASE fluid intake, gave pt detailed instructions about how diuretics work and reason she needs to monitor fluid intake. Pt verbalized understanding, and she will f/u with Dr. Graciela Husbands due to questions about device and continued SOB.

## 2011-03-12 NOTE — Telephone Encounter (Signed)
Pt having some SOB and wants to know if she needs to be seen or go to ER.

## 2011-03-24 ENCOUNTER — Ambulatory Visit (INDEPENDENT_AMBULATORY_CARE_PROVIDER_SITE_OTHER): Payer: Medicare Other | Admitting: Emergency Medicine

## 2011-03-24 DIAGNOSIS — I4891 Unspecified atrial fibrillation: Secondary | ICD-10-CM

## 2011-03-24 DIAGNOSIS — Z7901 Long term (current) use of anticoagulants: Secondary | ICD-10-CM

## 2011-03-24 LAB — POCT INR: INR: 2.9

## 2011-03-25 ENCOUNTER — Telehealth: Payer: Self-pay

## 2011-03-25 ENCOUNTER — Encounter: Payer: Self-pay | Admitting: Internal Medicine

## 2011-03-25 MED ORDER — WARFARIN SODIUM 4 MG PO TABS: ORAL_TABLET | ORAL | Status: DC

## 2011-03-25 NOTE — Telephone Encounter (Signed)
Refill sent for coumadin 4 mg take as directed by the anti coag clinic.

## 2011-04-01 ENCOUNTER — Ambulatory Visit (INDEPENDENT_AMBULATORY_CARE_PROVIDER_SITE_OTHER): Payer: Medicare Other | Admitting: Internal Medicine

## 2011-04-01 ENCOUNTER — Other Ambulatory Visit: Payer: Self-pay | Admitting: Internal Medicine

## 2011-04-01 ENCOUNTER — Encounter: Payer: Self-pay | Admitting: Internal Medicine

## 2011-04-01 DIAGNOSIS — E039 Hypothyroidism, unspecified: Secondary | ICD-10-CM

## 2011-04-01 DIAGNOSIS — R0602 Shortness of breath: Secondary | ICD-10-CM

## 2011-04-01 DIAGNOSIS — I428 Other cardiomyopathies: Secondary | ICD-10-CM

## 2011-04-01 DIAGNOSIS — I442 Atrioventricular block, complete: Secondary | ICD-10-CM

## 2011-04-01 LAB — PACEMAKER DEVICE OBSERVATION
ATRIAL PACING PM: 0
LV LEAD THRESHOLD: 1.375 V
RV LEAD AMPLITUDE: 7.125 mv
RV LEAD IMPEDENCE PM: 475 Ohm
RV LEAD THRESHOLD: 0.75 V

## 2011-04-01 NOTE — Patient Instructions (Addendum)
Your physician has requested that you have an echocardiogram. Echocardiography is a painless test that uses sound waves to create images of your heart. It provides your doctor with information about the size and shape of your heart and how well your heart's chambers and valves are working. This procedure takes approximately one hour. There are no restrictions for this procedure. We will schedule f/u after this procedure.   Your physician has recommended you make the following change in your medication: STOP Cardizem.

## 2011-04-02 LAB — CBC WITH DIFFERENTIAL
Basophils Absolute: 0 10*3/uL (ref 0.0–0.2)
Eosinophils Absolute: 0.2 10*3/uL (ref 0.0–0.4)
Immature Grans (Abs): 0 10*3/uL (ref 0.0–0.1)
Lymphs: 35 % (ref 14–46)
MCH: 31.3 pg (ref 26.6–33.0)
MCHC: 33.6 g/dL (ref 31.5–35.7)
Monocytes Absolute: 1 10*3/uL (ref 0.1–1.0)
Neutrophils Relative %: 53 % (ref 40–74)
Platelets: 300 10*3/uL (ref 140–415)
RDW: 14.9 % (ref 12.3–15.4)

## 2011-04-02 LAB — TSH: TSH: 1.14 u[IU]/mL (ref 0.450–4.500)

## 2011-04-05 NOTE — Progress Notes (Signed)
HPI  Jacqueline Lozano is a 76 y.o. female  Jacqueline Lozano is a 76 y.o. female Seen in followup for AV ablation in for control of paroxysmal atrial fibrillation with rapic ventricular response; at the same time (January 2012) she underwent CRT P. Implantation.  In the past she has failed sotalol amiodarone and multiple rate controlling meds we have seen her a few months ago we worked on reprogrammed her device which improved her breathing considerably allowing her to walk on 100-150 feet as opposed to 10 and 15 feet. She comes in now complaining that she can't breathe again.  Her BNP has been intervally measured and is normal  Past Medical History  Diagnosis Date  . Hypertension   . Hypothyroidism   . GERD (gastroesophageal reflux disease)   . Migraine   . OSA (obstructive sleep apnea)     Not consistently using CPAP  . Allergic to IV contrast   . PAD (peripheral artery disease)     Occlusive disease involving left PT and AT  . Hyperlipidemia     Has been unable to take statins due to muscle pain. Has tried multiple statins per her report.  . Abnormal echocardiogram 09/2009    EF >55%, mild LVH, moderate LAE, normal RV, normal PA systolic pressure  . Paroxysmal atrial fibrillation     Breakthrough a fib on sotalol, changed to amiodarone 4/11. DCCV 4/11    Past Surgical History  Procedure Date  . Cardiac catheterization     Left heart cath x3 in teh past, last 5 yrs ago. All "normal" per pt report  . Pacemaker insertion 06/12/2010  . Atrial ablation surgery 06/12/2010  . Appendectomy   . Cholecystectomy open   . Vesicovaginal fistula closure w/ tah   . Carpal tunnel release   . Cardioversion     Current Outpatient Prescriptions  Medication Sig Dispense Refill  . B-Complex TABS Take 1 tablet by mouth daily.        . budesonide-formoterol (SYMBICORT) 160-4.5 MCG/ACT inhaler Inhale 2 puffs into the lungs daily.        . butalbital-acetaminophen-caffeine (FIORICET WITH  CODEINE) 50-325-40-30 MG per capsule Take 1 capsule by mouth every 4 (four) hours as needed.        . diltiazem (CARDIZEM CD) 240 MG 24 hr capsule Take 1 capsule (240 mg total) by mouth daily.  30 capsule  6  . diphenoxylate-atropine (LOMOTIL) 2.5-0.025 MG per tablet Take 1 tablet by mouth 4 (four) times daily as needed.        . ergocalciferol (VITAMIN D2) 50000 UNITS capsule Take 50,000 Units by mouth once a week.        . famotidine (PEPCID) 20 MG tablet Take 20 mg by mouth 2 (two) times daily.        Marland Kitchen levothyroxine (SYNTHROID, LEVOTHROID) 125 MCG tablet Take 125 mcg by mouth daily.        . prochlorperazine (COMPAZINE) 10 MG tablet Take 10 mg by mouth every 6 (six) hours as needed.        . saccharomyces boulardii (FLORASTOR) 250 MG capsule Take 250 mg by mouth 2 (two) times daily.        . vancomycin (VANCOCIN) 125 MG capsule Take 125 mg by mouth 4 (four) times daily.        Marland Kitchen warfarin (COUMADIN) 4 MG tablet Take as directed by Anticoagulation clinic  60 tablet  3  . furosemide (LASIX) 40 MG tablet Take 1 tablet (40 mg total) by  mouth daily.  30 tablet  3  . potassium chloride (K-DUR) 10 MEQ tablet Take 2 tablets (20 mEq total) by mouth 1 day or 1 dose.  60 tablet  3    Allergies  Allergen Reactions  . Codeine   . Dipyridamole   . Iodine   . Morphine   . Oxycodone-Acetaminophen   . Statins   . Sulfonamide Derivatives     Review of Systems negative except from HPI and PMH  Physical Exam Well developed and morbidly obese woman in no acute distress HENT normal E scleral and icterus clear Neck Supple JVP flat; carotids brisk and full Clear to ausculation Regular rate and rhythm, no murmurs gallops or rub Soft with active bowel sounds No clubbing cyanosis and edema Alert and oriented, grossly normal motor and sensory function Skin Warm and Dry    Assessment and  Plan

## 2011-04-05 NOTE — Assessment & Plan Note (Addendum)
We tried again to do some programming to help her breathing. I'm not sure that we accomplished much. There is no particular role for optimization; but that is something we might be able to try. I've recommended that we consider a pulmonary referral but prior to that I wanted to do an echo cardiogram with some walking to see if she had some mitral regurgitation

## 2011-04-06 ENCOUNTER — Encounter: Payer: Self-pay | Admitting: Internal Medicine

## 2011-04-09 ENCOUNTER — Other Ambulatory Visit (INDEPENDENT_AMBULATORY_CARE_PROVIDER_SITE_OTHER): Payer: Medicare Other | Admitting: *Deleted

## 2011-04-09 ENCOUNTER — Other Ambulatory Visit: Payer: Self-pay | Admitting: Internal Medicine

## 2011-04-09 ENCOUNTER — Ambulatory Visit (INDEPENDENT_AMBULATORY_CARE_PROVIDER_SITE_OTHER): Payer: Medicare Other | Admitting: Internal Medicine

## 2011-04-09 DIAGNOSIS — I428 Other cardiomyopathies: Secondary | ICD-10-CM

## 2011-04-09 DIAGNOSIS — I442 Atrioventricular block, complete: Secondary | ICD-10-CM

## 2011-04-09 DIAGNOSIS — I519 Heart disease, unspecified: Secondary | ICD-10-CM

## 2011-04-09 DIAGNOSIS — R0602 Shortness of breath: Secondary | ICD-10-CM

## 2011-04-09 DIAGNOSIS — R0600 Dyspnea, unspecified: Secondary | ICD-10-CM

## 2011-04-09 LAB — PACEMAKER DEVICE OBSERVATION
AL IMPEDENCE PM: 4047 Ohm
ATRIAL PACING PM: 0
BATTERY VOLTAGE: 3.0119 V
LV LEAD THRESHOLD: 1.5 V
RV LEAD IMPEDENCE PM: 475 Ohm
RV LEAD THRESHOLD: 0.75 V

## 2011-04-09 MED ORDER — TORSEMIDE 20 MG PO TABS: 20.0000 mg | ORAL_TABLET | Freq: Every day | ORAL | Status: DC

## 2011-04-09 NOTE — Progress Notes (Signed)
Was seen for vv optimzation  LV function was essentially normal. There was no mitral regurgitation. TVI was unchanged from RV pacing to LV -50 ms. We left her RV -50. Because of problems with furosemide in the past she was given Demadex at her optivol index was increasing.

## 2011-04-09 NOTE — Patient Instructions (Addendum)
Your physician has recommended you make the following change in your medication: STOP Furosemide (lasix). START Torsemide 20mg  once daily. Please call for refills if you do not have any problems with this medication.  You have been referred to [facility]: Please arrive at the [facility] office 05/03/11 @ 3:15pm for a 3:30pm appt.

## 2011-04-21 ENCOUNTER — Ambulatory Visit (INDEPENDENT_AMBULATORY_CARE_PROVIDER_SITE_OTHER): Payer: Medicare Other | Admitting: Emergency Medicine

## 2011-04-21 DIAGNOSIS — I4891 Unspecified atrial fibrillation: Secondary | ICD-10-CM

## 2011-04-21 DIAGNOSIS — Z7901 Long term (current) use of anticoagulants: Secondary | ICD-10-CM

## 2011-04-21 LAB — POCT INR: INR: 2.1

## 2011-05-03 ENCOUNTER — Encounter: Payer: Self-pay | Admitting: *Deleted

## 2011-05-03 ENCOUNTER — Encounter: Payer: Self-pay | Admitting: Pulmonary Disease

## 2011-05-03 ENCOUNTER — Ambulatory Visit (INDEPENDENT_AMBULATORY_CARE_PROVIDER_SITE_OTHER): Payer: Medicare Other | Admitting: Pulmonary Disease

## 2011-05-03 VITALS — BP 122/82 | HR 76 | Temp 97.6°F | Ht 62.0 in | Wt 208.0 lb

## 2011-05-03 DIAGNOSIS — R0602 Shortness of breath: Secondary | ICD-10-CM

## 2011-05-03 DIAGNOSIS — R0609 Other forms of dyspnea: Secondary | ICD-10-CM

## 2011-05-03 MED ORDER — BUDESONIDE-FORMOTEROL FUMARATE 160-4.5 MCG/ACT IN AERO: 2.0000 | INHALATION_SPRAY | Freq: Two times a day (BID) | RESPIRATORY_TRACT | Status: DC

## 2011-05-03 NOTE — Assessment & Plan Note (Addendum)
Ms. Jacqueline Lozano is a 76 y/o obese, deconditioned female who is referred to our clinic for evaluation of dyspnea on exertion.  She is limited to performing ADL's and is limited in her ability to go shopping or walks for enjoyment by her dyspnea.  A recent echocardiogram did no demonstrate evidence of valvular disease or significant systolic ventricular dysfunction.  She does not have obstruction on simple spirometry performed today in clinic and prior PFTs from 2010 demonstrate restriction with a low expiratory reserve volume consistent with obesity.  Today while walking she did not desaturate, but became quite dyspnic with exertion.    I offered cardiopulmonary exercise testing today, but explained to her that I though that there was a good chance it would tell us that she is deconditioned.  This, in addition to her obesity are the most likely causes of her dyspnea on exertion.  She does not have significant pulmonary pathology identifiable on today's evaluation.  To complete the work up we will perform a CXR today, however I doubt we will see any gross abnormalities.    In addition to deconditioning and obesity, one would consider pulmonary hypertension, anemia, or airway abnormalities of her dyspnea, but I find little evidence to support any of these.  Today in clinic I advised that she start on a regular exercise routine and to begin calorie counting to attempt to lose weight.  I suggested that she set a goal of losing ten pounds before she see me in clinic in January.  She laughed and said that it was unlikely that she would do this, explaining to me that she had no desire to change her diet.  She did show some interest in exercising more after lengthy discussion and encouragement from me.

## 2011-05-03 NOTE — Patient Instructions (Signed)
Continue taking your symbicort.  We will see you back in January 2013.  Until then, you need to exercise regularly.  We recommend that you participate in a water aerobics or more strenuous exercise routine.  You can contact the [facility] on [address] [place], [place] [phone] or the [facility].  Please try to exercise regularly and lose 10 lbs before our next visit.

## 2011-05-03 NOTE — Progress Notes (Signed)
Subjective:    Patient ID: Jacqueline Lozano, female    DOB: 04/08/1935, 76 y.o.   MRN: [id number]  HPI 76 y/o female with A-fib, hypothyroidism, and OSA who presents to our clinic for evaluation of shortness of breath.  She states that she has noticed dyspnea most significantly since her A-fib ablation performed several months ago.  Specifically, she says that she cannot walk further than 30-40 feet before she becomes dyspnic.  She denies chest tightness, wheezing, or coughing.  When asked further, she states that she was last not short of breath with exertion several years ago, but she thinks that it has been worse since the ablation.  She was sent for PFT's two years ago and was put on symbicort thereafter.  She does not believe that the symbicort has helped, and she has never heard of albuterol or a rescue inhaler.  She notes that one time the smell of fresh asphalt caused her to become acutely short of breath, but she has no recollection of any other environmental triggers.    She also notes that she frequently fails to use her CPAP machine because she doesn't like wearing the mask.   Past Medical History  Diagnosis Date  . Hypertension   . Hypothyroidism   . GERD (gastroesophageal reflux disease)   . Migraine   . OSA (obstructive sleep apnea)     Not consistently using CPAP  . Allergic to IV contrast   . PAD (peripheral artery disease)     Occlusive disease involving left PT and AT  . Hyperlipidemia     Has been unable to take statins due to muscle pain. Has tried multiple statins per her report.  . Abnormal echocardiogram 09/2009    EF >55%, mild LVH, moderate LAE, normal RV, normal PA systolic pressure  . Paroxysmal atrial fibrillation     Breakthrough a fib on sotalol, changed to amiodarone 4/11. DCCV 4/11     Family History  Problem Relation Age of Onset  . Heart attack Mother     MI x2  . Heart attack Father     MI  . Colon cancer Daughter      History   Social  History  . Marital Status: Widowed    Spouse Name: N/A    Number of Children: N/A  . Years of Education: N/A   Occupational History  . Not on file.   Social History Main Topics  . Smoking status: Never Smoker   . Smokeless tobacco: Never Used   Comment: pos smoke exposure through father and spouse who smoked in the home with her for many years  . Alcohol Use: No  . Drug Use: No  . Sexually Active: Not on file   Other Topics Concern  . Not on file   Social History Narrative   [place]     Allergies  Allergen Reactions  . Codeine   . Dipyridamole   . Iodine   . Morphine   . Oxycodone-Acetaminophen   . Statins   . Sulfonamide Derivatives      Outpatient Prescriptions Prior to Visit  Medication Sig Dispense Refill  . butalbital-acetaminophen-caffeine (FIORICET WITH CODEINE) 50-325-40-30 MG per capsule Take 1 capsule by mouth every 4 (four) hours as needed.        . diphenoxylate-atropine (LOMOTIL) 2.5-0.025 MG per tablet Take 1 tablet by mouth 4 (four) times daily as needed.        . ergocalciferol (VITAMIN D2) 50000 UNITS capsule Take 50,000  Units by mouth once a week.        . levothyroxine (SYNTHROID, LEVOTHROID) 125 MCG tablet Take 125 mcg by mouth daily.        . prochlorperazine (COMPAZINE) 10 MG tablet Take 10 mg by mouth every 6 (six) hours as needed.        . warfarin (COUMADIN) 4 MG tablet Take as directed by Anticoagulation clinic  60 tablet  3  . budesonide-formoterol (SYMBICORT) 160-4.5 MCG/ACT inhaler Inhale 2 puffs into the lungs 2 (two) times daily.       . potassium chloride (K-DUR) 10 MEQ tablet Take 2 tablets (20 mEq total) by mouth 1 day or 1 dose.  60 tablet  3  . torsemide (DEMADEX) 20 MG tablet Take 1 tablet (20 mg total) by mouth daily.  30 tablet  0  . B-Complex TABS Take 1 tablet by mouth daily.        . famotidine (PEPCID) 20 MG tablet Take 20 mg by mouth 2 (two) times daily.        Marland Kitchen saccharomyces boulardii (FLORASTOR) 250  MG capsule Take 250 mg by mouth 2 (two) times daily.        . vancomycin (VANCOCIN) 125 MG capsule Take 125 mg by mouth 4 (four) times daily.          Review of Systems  Constitutional: Negative for fever, chills and unexpected weight change.  HENT: Positive for rhinorrhea and postnasal drip. Negative for ear pain, nosebleeds, congestion, sore throat, sneezing, trouble swallowing, dental problem, voice change and sinus pressure.   Eyes: Negative for visual disturbance.  Respiratory: Positive for shortness of breath. Negative for cough and choking.   Cardiovascular: Negative for chest pain and leg swelling.  Gastrointestinal: Negative for vomiting, abdominal pain and diarrhea.  Genitourinary: Negative for difficulty urinating.  Musculoskeletal: Negative for arthralgias.  Skin: Negative for rash.  Neurological: Positive for headaches. Negative for tremors and syncope.  Hematological: Bruises/bleeds easily.       Objective:   Physical Exam  Filed Vitals:   05/03/11 1510  BP: 122/82  Pulse: 76  Temp: 97.6 F (36.4 C)  TempSrc: Oral  Height: 5\' 2"  (1.575 m)  Weight: 208 lb (94.348 kg)  SpO2: 97%   Gen: obese, chronically ill appearing female in no acute distress HEENT: NCAT, PERRL, EOMi, OP clear, neck supple without masses PULM: CTA B CV: RRR, distant heart sounds but no obvious, no JVD AB: BS+, soft, nontender, no hsm, obese abdomen Ext: warm, trace edema, no clubbing, no cyanosis Derm: no rash or skin breakdown Neuro: A&Ox4, CN II-XII intact, strength 5/5 in all 4 extremeties  04/09/11 2D TTE: normal systolic function, LVEF 55-60%, no focal wall motion abnormalties, normal RV function  Simple spirometry performed in clinic today: no evidence of obstruction  Full PFT's 2010: moderate restriction with low ERV consistent with obesity  October 2012 Hct 36     Assessment & Plan:   DYSPNEA Jacqueline Lozano is a 76 y/o obese, deconditioned female who is referred to our clinic  for evaluation of dyspnea on exertion.  She is limited to performing ADL's and is limited in her ability to go shopping or walks for enjoyment by her dyspnea.  A recent echocardiogram did no demonstrate evidence of valvular disease or significant systolic ventricular dysfunction.  She does not have obstruction on simple spirometry performed today in clinic and prior PFTs from 2010 demonstrate restriction with a low expiratory reserve volume consistent with obesity.  Today  while walking she did not desaturate, but became quite dyspnic with exertion.    I offered cardiopulmonary exercise testing today, but explained to her that I though that there was a good chance it would tell us that she is deconditioned.  This, in addition to her obesity are the most likely causes of her dyspnea on exertion.  She does not have significant pulmonary pathology identifiable on today's evaluation.  To complete the work up we will perform a CXR today, however I doubt we will see any gross abnormalities.    In addition to deconditioning and obesity, one would consider pulmonary hypertension, anemia, or airway abnormalities of her dyspnea, but I find little evidence to support any of these.  Today in clinic I advised that she start on a regular exercise routine and to begin calorie counting to attempt to lose weight.  I suggested that she set a goal of losing ten pounds before she see me in clinic in January.  She laughed and said that it was unlikely that she would do this, explaining to me that she had no desire to change her diet.  She did show some interest in exercising more after lengthy discussion and encouragement from me.    Updated Medication List Outpatient Encounter Prescriptions as of 05/03/2011  Medication Sig Dispense Refill  . aluminum hydroxide-magnesium carbonate (GAVISCON) 31.7-137 MG/5ML suspension Take 5 mLs by mouth every 6 (six) hours as needed.        . budesonide-formoterol (SYMBICORT) 160-4.5  MCG/ACT inhaler Inhale 2 puffs into the lungs 2 (two) times daily.  1 Inhaler  3  . butalbital-acetaminophen-caffeine (FIORICET WITH CODEINE) 50-325-40-30 MG per capsule Take 1 capsule by mouth every 4 (four) hours as needed.        . diphenoxylate-atropine (LOMOTIL) 2.5-0.025 MG per tablet Take 1 tablet by mouth 4 (four) times daily as needed.        . ergocalciferol (VITAMIN D2) 50000 UNITS capsule Take 50,000 Units by mouth once a week.        . levothyroxine (SYNTHROID, LEVOTHROID) 125 MCG tablet Take 125 mcg by mouth daily.        . potassium chloride (K-DUR) 10 MEQ tablet Take 20 mEq by mouth every other day.        . prochlorperazine (COMPAZINE) 10 MG tablet Take 10 mg by mouth every 6 (six) hours as needed.        . torsemide (DEMADEX) 20 MG tablet Take 20 mg by mouth daily as needed.        . warfarin (COUMADIN) 4 MG tablet Take as directed by Anticoagulation clinic  60 tablet  3  . DISCONTD: budesonide-formoterol (SYMBICORT) 160-4.5 MCG/ACT inhaler Inhale 2 puffs into the lungs 2 (two) times daily.       Marland Kitchen DISCONTD: potassium chloride (K-DUR) 10 MEQ tablet Take 2 tablets (20 mEq total) by mouth 1 day or 1 dose.  60 tablet  3  . DISCONTD: torsemide (DEMADEX) 20 MG tablet Take 1 tablet (20 mg total) by mouth daily.  30 tablet  0  . B-Complex TABS Take 1 tablet by mouth daily.        Marland Kitchen DISCONTD: famotidine (PEPCID) 20 MG tablet Take 20 mg by mouth 2 (two) times daily.        Marland Kitchen DISCONTD: saccharomyces boulardii (FLORASTOR) 250 MG capsule Take 250 mg by mouth 2 (two) times daily.        Marland Kitchen DISCONTD: vancomycin (VANCOCIN) 125 MG capsule Take 125 mg  by mouth 4 (four) times daily.

## 2011-05-07 MED ORDER — SCOPOLAMINE 1 MG/3DAYS TD PT72
MEDICATED_PATCH | TRANSDERMAL | Status: AC
  Filled 2011-05-07: qty 1

## 2011-05-19 ENCOUNTER — Ambulatory Visit (INDEPENDENT_AMBULATORY_CARE_PROVIDER_SITE_OTHER): Payer: Medicare Other | Admitting: Emergency Medicine

## 2011-05-19 DIAGNOSIS — I4891 Unspecified atrial fibrillation: Secondary | ICD-10-CM

## 2011-05-19 DIAGNOSIS — Z7901 Long term (current) use of anticoagulants: Secondary | ICD-10-CM

## 2011-06-30 ENCOUNTER — Ambulatory Visit (INDEPENDENT_AMBULATORY_CARE_PROVIDER_SITE_OTHER): Payer: Medicare Other | Admitting: Emergency Medicine

## 2011-06-30 DIAGNOSIS — Z7901 Long term (current) use of anticoagulants: Secondary | ICD-10-CM

## 2011-06-30 DIAGNOSIS — I4891 Unspecified atrial fibrillation: Secondary | ICD-10-CM

## 2011-07-01 ENCOUNTER — Encounter: Payer: Self-pay | Admitting: Internal Medicine

## 2011-07-01 ENCOUNTER — Ambulatory Visit (INDEPENDENT_AMBULATORY_CARE_PROVIDER_SITE_OTHER): Payer: Medicare Other | Admitting: *Deleted

## 2011-07-01 DIAGNOSIS — I442 Atrioventricular block, complete: Secondary | ICD-10-CM

## 2011-07-01 DIAGNOSIS — I4891 Unspecified atrial fibrillation: Secondary | ICD-10-CM

## 2011-07-01 DIAGNOSIS — I509 Heart failure, unspecified: Secondary | ICD-10-CM

## 2011-07-03 LAB — REMOTE PACEMAKER DEVICE
ATRIAL PACING PM: 0
LV LEAD THRESHOLD: 1.125 V
RV LEAD THRESHOLD: 0.75 V

## 2011-07-06 ENCOUNTER — Encounter: Payer: Self-pay | Admitting: *Deleted

## 2011-07-06 NOTE — Progress Notes (Signed)
Remote pacer check  

## 2011-08-06 ENCOUNTER — Ambulatory Visit (INDEPENDENT_AMBULATORY_CARE_PROVIDER_SITE_OTHER): Payer: Medicare Other

## 2011-08-06 DIAGNOSIS — I4891 Unspecified atrial fibrillation: Secondary | ICD-10-CM

## 2011-08-06 DIAGNOSIS — Z7901 Long term (current) use of anticoagulants: Secondary | ICD-10-CM

## 2011-08-06 LAB — POCT INR: INR: 2.3

## 2011-09-15 ENCOUNTER — Ambulatory Visit (INDEPENDENT_AMBULATORY_CARE_PROVIDER_SITE_OTHER): Payer: Medicare Other

## 2011-09-15 DIAGNOSIS — Z7901 Long term (current) use of anticoagulants: Secondary | ICD-10-CM

## 2011-09-15 DIAGNOSIS — I4891 Unspecified atrial fibrillation: Secondary | ICD-10-CM

## 2011-09-15 LAB — POCT INR: INR: 2.6

## 2011-09-28 ENCOUNTER — Encounter: Payer: Self-pay | Admitting: Internal Medicine

## 2011-09-28 ENCOUNTER — Ambulatory Visit (INDEPENDENT_AMBULATORY_CARE_PROVIDER_SITE_OTHER): Payer: Medicare Other | Admitting: Internal Medicine

## 2011-09-28 VITALS — BP 162/84 | HR 77 | Ht 62.0 in | Wt 203.5 lb

## 2011-09-28 DIAGNOSIS — R5381 Other malaise: Secondary | ICD-10-CM

## 2011-09-28 DIAGNOSIS — I442 Atrioventricular block, complete: Secondary | ICD-10-CM

## 2011-09-28 DIAGNOSIS — R5383 Other fatigue: Secondary | ICD-10-CM

## 2011-09-28 DIAGNOSIS — I10 Essential (primary) hypertension: Secondary | ICD-10-CM

## 2011-09-28 DIAGNOSIS — Z950 Presence of cardiac pacemaker: Secondary | ICD-10-CM

## 2011-09-28 DIAGNOSIS — I4891 Unspecified atrial fibrillation: Secondary | ICD-10-CM

## 2011-09-28 LAB — PACEMAKER DEVICE OBSERVATION
LV LEAD THRESHOLD: 1.125 V
RV LEAD AMPLITUDE: 5.25 mv
RV LEAD THRESHOLD: 0.75 V

## 2011-09-28 MED ORDER — AMLODIPINE BESYLATE 10 MG PO TABS: 10.0000 mg | ORAL_TABLET | Freq: Every day | ORAL | Status: DC

## 2011-09-28 NOTE — Progress Notes (Signed)
HPI  Jacqueline Lozano is a 77 y.o. female  Seen in followup for AV ablation in for control of paroxysmal atrial fibrillation with rapic ventricular response; at the same time (January 2012) she underwent CRT P. Implantation.  In the past she has failed sotalol amiodarone and multiple rate controlling meds we have seen her a few months ago we worked on reprogrammed her device which improved her breathing considerably allowing her to walk on 100-150 feet as opposed to 10 and 15 feet. We treid VV optimization and she saw pulmonary who felt this was largely deconditioning. She was disinclined at that time to change her diet. She was open to the ID of exercise.  Her dyspnea remains her major complaint. She has some episodic chest pain.  Past Medical History  Diagnosis Date  . Hypertension   . Hypothyroidism   . GERD (gastroesophageal reflux disease)   . Migraine   . OSA (obstructive sleep apnea)     Not consistently using CPAP  . Allergic to IV contrast   . PAD (peripheral artery disease)     Occlusive disease involving left PT and AT  . Hyperlipidemia     Has been unable to take statins due to muscle pain. Has tried multiple statins per her report.  . Abnormal echocardiogram 09/2009    EF >55%, mild LVH, moderate LAE, normal RV, normal PA systolic pressure  . Paroxysmal atrial fibrillation     Breakthrough a fib on sotalol, changed to amiodarone 4/11. DCCV 4/11    Past Surgical History  Procedure Date  . Cardiac catheterization     Left heart cath x3 in teh past, last 5 yrs ago. All "normal" per pt report  . Pacemaker insertion 06/12/2010  . Atrial ablation surgery 06/12/2010  . Appendectomy   . Cholecystectomy open   . Vesicovaginal fistula closure w/ tah   . Carpal tunnel release   . Cardioversion     Current Outpatient Prescriptions  Medication Sig Dispense Refill  . aspirin 81 MG tablet Take 81 mg by mouth daily.      . budesonide-formoterol (SYMBICORT) 160-4.5 MCG/ACT  inhaler Inhale 2 puffs into the lungs 2 (two) times daily.  1 Inhaler  3  . butalbital-acetaminophen-caffeine (FIORICET WITH CODEINE) 50-325-40-30 MG per capsule Take 1 capsule by mouth every 4 (four) hours as needed.        . Cyanocobalamin (B-12 SL) Place under the tongue once a week.      . diphenoxylate-atropine (LOMOTIL) 2.5-0.025 MG per tablet Take 1 tablet by mouth 4 (four) times daily as needed.        . ergocalciferol (VITAMIN D2) 50000 UNITS capsule Take 50,000 Units by mouth once a week.        . famotidine (PEPCID) 20 MG tablet Take 20 mg by mouth 2 (two) times daily.      Marland Kitchen levothyroxine (SYNTHROID, LEVOTHROID) 125 MCG tablet Take 125 mcg by mouth daily.        . metoprolol (LOPRESSOR) 50 MG tablet Take 50 mg by mouth 2 (two) times daily. Takes 1/2 tablet in the morning and 1/2 tablet night.      . prochlorperazine (COMPAZINE) 10 MG tablet Take 10 mg by mouth every 6 (six) hours as needed.        . warfarin (COUMADIN) 4 MG tablet Take as directed by Anticoagulation clinic  60 tablet  3  . B-Complex TABS Take 1 tablet by mouth daily.  Allergies  Allergen Reactions  . Codeine   . Dipyridamole   . Iodine   . Morphine   . Oxycodone-Acetaminophen   . Statins   . Sulfonamide Derivatives     Review of Systems negative except from HPI and PMH  Physical Exam BP 162/84  Pulse 77  Ht 5\' 2"  (1.575 m)  Wt 203 lb 8 oz (92.307 kg)  BMI 37.22 kg/m2 Well developed and well nourished in no acute distress HENT normal E scleral and icterus clear Neck Supple JVP flat; carotids brisk and full Clear to ausculation Regular rate and rhythm, no murmurs gallops or rub Soft with active bowel sounds No clubbing cyanosis none Edema Alert and oriented, grossly normal motor and sensory function Skin Warm and Dry   Assessment and  Plan

## 2011-09-28 NOTE — Assessment & Plan Note (Signed)
Stable post pacing 

## 2011-09-28 NOTE — Assessment & Plan Note (Signed)
The patient's device was interrogated.  The information was reviewed. No changes were made in the programming.    

## 2011-09-28 NOTE — Assessment & Plan Note (Signed)
Her blood pressure remains an issue. I am still not convinced that her medications are contributing to her fatigue. We'll discontinue her Lopressor and begin her on amlodipine 10. She'll followup with her PCP

## 2011-09-28 NOTE — Patient Instructions (Addendum)
STOP Aspirin. STOP Metoprolol Tartrate.  STOP B Complex. START Amlodipine 10 mg (1 tablet) by mouth daily.  Call in to [facility] July 25. Follow up in 12 months.

## 2011-09-28 NOTE — Assessment & Plan Note (Signed)
Permanent. We will continue her on warfarin and discontinue her aspirin

## 2011-09-28 NOTE — Assessment & Plan Note (Signed)
As above.

## 2011-10-27 ENCOUNTER — Ambulatory Visit (INDEPENDENT_AMBULATORY_CARE_PROVIDER_SITE_OTHER): Payer: Medicare Other

## 2011-10-27 ENCOUNTER — Other Ambulatory Visit: Payer: Self-pay | Admitting: Cardiovascular Disease

## 2011-10-27 DIAGNOSIS — Z7901 Long term (current) use of anticoagulants: Secondary | ICD-10-CM

## 2011-10-27 DIAGNOSIS — I4891 Unspecified atrial fibrillation: Secondary | ICD-10-CM

## 2011-10-27 LAB — POCT INR: INR: 2.5

## 2011-10-27 NOTE — Telephone Encounter (Signed)
Sent to [facility]. 

## 2011-11-16 ENCOUNTER — Emergency Department: Payer: Self-pay | Admitting: Emergency Medicine

## 2011-11-16 LAB — CBC
HCT: 37.7 % (ref 35.0–47.0)
MCH: 31.7 pg (ref 26.0–34.0)
MCHC: 33.7 g/dL (ref 32.0–36.0)
RBC: 4.01 10*6/uL (ref 3.80–5.20)
WBC: 8.8 10*3/uL (ref 3.6–11.0)

## 2011-11-16 LAB — COMPREHENSIVE METABOLIC PANEL
Alkaline Phosphatase: 101 U/L (ref 50–136)
Anion Gap: 8 (ref 7–16)
Bilirubin,Total: 0.3 mg/dL (ref 0.2–1.0)
Calcium, Total: 8.7 mg/dL (ref 8.5–10.1)
Chloride: 107 mmol/L (ref 98–107)
Co2: 24 mmol/L (ref 21–32)
EGFR (African American): >60
Glucose: 104 mg/dL — ABNORMAL HIGH (ref 65–99)
Potassium: 3.7 mmol/L (ref 3.5–5.1)
SGOT(AST): 29 U/L (ref 15–37)
SGPT (ALT): 25 U/L

## 2011-11-16 LAB — PROTIME-INR: Prothrombin Time: 24.1 secs — ABNORMAL HIGH (ref 11.5–14.7)

## 2011-11-16 LAB — CK TOTAL AND CKMB (NOT AT ARMC): CK, Total: 54 U/L (ref 21–215)

## 2011-11-16 LAB — TROPONIN I: Troponin-I: 0.02 ng/mL

## 2011-11-17 ENCOUNTER — Encounter: Payer: Self-pay | Admitting: Cardiovascular Disease

## 2011-11-17 ENCOUNTER — Ambulatory Visit (INDEPENDENT_AMBULATORY_CARE_PROVIDER_SITE_OTHER): Payer: Medicare Other | Admitting: Cardiovascular Disease

## 2011-11-17 VITALS — BP 168/80 | HR 77 | Ht 62.0 in | Wt 201.0 lb

## 2011-11-17 DIAGNOSIS — I10 Essential (primary) hypertension: Secondary | ICD-10-CM

## 2011-11-17 DIAGNOSIS — R0602 Shortness of breath: Secondary | ICD-10-CM

## 2011-11-17 DIAGNOSIS — R079 Chest pain, unspecified: Secondary | ICD-10-CM

## 2011-11-17 DIAGNOSIS — E785 Hyperlipidemia, unspecified: Secondary | ICD-10-CM

## 2011-11-17 DIAGNOSIS — I4891 Unspecified atrial fibrillation: Secondary | ICD-10-CM

## 2011-11-17 MED ORDER — METOPROLOL TARTRATE 50 MG PO TABS: 50.0000 mg | ORAL_TABLET | Freq: Two times a day (BID) | ORAL | Status: DC

## 2011-11-17 NOTE — Assessment & Plan Note (Signed)
We'll discuss starting a cholesterol medication with her on her next visit.

## 2011-11-17 NOTE — Assessment & Plan Note (Signed)
History of AV node ablation. We will restart metoprolol as she symptomatically feels better with the medication.

## 2011-11-17 NOTE — Assessment & Plan Note (Signed)
Blood pressure is very elevated on today's visit. We will restart metoprolol 50 mg twice a day given her palpitations at nighttime. She felt better on the metoprolol. We'll continue amlodipine and have her watch her blood pressure.

## 2011-11-17 NOTE — Progress Notes (Signed)
Patient ID: Jacqueline Lozano, female    DOB: [birth year], 77 y.o.   MRN: [id number]  HPI Comments: Jacqueline Lozano  Is a pleasant 77 year old woman with a history of Atrial fibrillation, status post arteriovenous node ablation, Pacer/CRT-P, Hypertension, Paroxysmal nocturnal dyspnea, Hypothyroidism, Diastolic congestive heart failure, C. difficile infection, who presents for evaluation of chest palpitations.  She reports having fluttering in her chest, most notable at nighttime." It has a rhythm to it" she felt that someone was knocking on the wall of her building and wanted to complain about her neighbor. It turned out it was her heart beat. She appreciates this mostly at nighttime, sometimes in the daytime. It is not a fast rhythm, just very strong and loud, sometimes faster. She has not been checking her blood pressure at home but she does have a blood pressure cuff. She went to the emergency room yesterday for evaluation of these symptoms. She was discharged as everything looked within normal limits. Chest x-ray was normal.  She does report that her medications were recently checked by Dr. Graciela Husbands.    EKG today shows paced rhythm at 77 beats per minute      Outpatient Encounter Prescriptions as of 11/17/2011  Medication Sig Dispense Refill  . amLODipine (NORVASC) 10 MG tablet Take 1 tablet (10 mg total) by mouth daily.  30 tablet  6  . budesonide-formoterol (SYMBICORT) 160-4.5 MCG/ACT inhaler Inhale 2 puffs into the lungs 2 (two) times daily.  1 Inhaler  3  . Butalbital-APAP-Caffeine (FIORICET PO) Take by mouth. Takes 2 tablets daily as needed.      Marland Kitchen COUMADIN 4 MG tablet TAKE AS DIRECTED BY ANTICOAGULATION CLINIC  60 tablet  3  . Cyanocobalamin (B-12 SL) Place under the tongue once a week.      . diphenoxylate-atropine (LOMOTIL) 2.5-0.025 MG per tablet Take 1 tablet by mouth 4 (four) times daily as needed.        . ergocalciferol (VITAMIN D2) 50000 UNITS capsule Take 50,000 Units by mouth once a  week.        . famotidine (PEPCID) 20 MG tablet Take 20 mg by mouth 2 (two) times daily.      Marland Kitchen levothyroxine (SYNTHROID, LEVOTHROID) 125 MCG tablet Take 125 mcg by mouth daily.        . prochlorperazine (COMPAZINE) 5 MG tablet Take 5 mg by mouth every 6 (six) hours as needed.      . metoprolol (LOPRESSOR) 50 MG tablet Take 1 tablet (50 mg total) by mouth 2 (two) times daily.  60 tablet  6   Review of Systems  Constitutional: Negative.   HENT: Negative.   Eyes: Negative.   Respiratory: Positive for shortness of breath.   Cardiovascular: Positive for palpitations.  Gastrointestinal: Negative.   Musculoskeletal: Negative.   Skin: Negative.   Neurological: Negative.   Hematological: Negative.   Psychiatric/Behavioral: Negative.   All other systems reviewed and are negative.    BP 168/80  Pulse 77  Ht 5\' 2"  (1.575 m)  Wt 201 lb (91.173 kg)  BMI 36.76 kg/m2  Physical Exam  Nursing note and vitals reviewed. Constitutional: She is oriented to person, place, and time. She appears well-developed and well-nourished.  HENT:  Head: Normocephalic.  Nose: Nose normal.  Mouth/Throat: Oropharynx is clear and moist.  Eyes: Conjunctivae are normal. Pupils are equal, round, and reactive to light.  Neck: Normal range of motion. Neck supple. No JVD present.  Cardiovascular: Normal rate, regular rhythm, S1 normal, S2 normal and  intact distal pulses.  Exam reveals no gallop and no friction rub.   Murmur heard.  Crescendo systolic murmur is present with a grade of 2/6  Pulmonary/Chest: Effort normal and breath sounds normal. No respiratory distress. She has no wheezes. She has no rales. She exhibits no tenderness.  Abdominal: Soft. Bowel sounds are normal. She exhibits no distension. There is no tenderness.  Musculoskeletal: Normal range of motion. She exhibits no edema and no tenderness.  Lymphadenopathy:    She has no cervical adenopathy.  Neurological: She is alert and oriented to person,  place, and time. Coordination normal.  Skin: Skin is warm and dry. No rash noted. No erythema.  Psychiatric: She has a normal mood and affect. Her behavior is normal. Judgment and thought content normal.         Assessment and Plan

## 2011-11-17 NOTE — Patient Instructions (Addendum)
You are doing well. Please start metoprolol 50 mg twice a day (one pill twice a day)  Please call us if you have new issues that need to be addressed before your next appt.  Your physician wants you to follow-up in: 1 months.

## 2011-12-08 ENCOUNTER — Ambulatory Visit (INDEPENDENT_AMBULATORY_CARE_PROVIDER_SITE_OTHER): Payer: Medicare Other | Admitting: Cardiovascular Disease

## 2011-12-08 ENCOUNTER — Encounter: Payer: Self-pay | Admitting: Cardiovascular Disease

## 2011-12-08 ENCOUNTER — Ambulatory Visit (INDEPENDENT_AMBULATORY_CARE_PROVIDER_SITE_OTHER): Payer: Medicare Other | Admitting: *Deleted

## 2011-12-08 VITALS — BP 140/78 | HR 74 | Ht 63.0 in | Wt 200.5 lb

## 2011-12-08 DIAGNOSIS — I4891 Unspecified atrial fibrillation: Secondary | ICD-10-CM

## 2011-12-08 DIAGNOSIS — E785 Hyperlipidemia, unspecified: Secondary | ICD-10-CM

## 2011-12-08 DIAGNOSIS — Z950 Presence of cardiac pacemaker: Secondary | ICD-10-CM

## 2011-12-08 DIAGNOSIS — I10 Essential (primary) hypertension: Secondary | ICD-10-CM

## 2011-12-08 DIAGNOSIS — Z7901 Long term (current) use of anticoagulants: Secondary | ICD-10-CM

## 2011-12-08 DIAGNOSIS — R0602 Shortness of breath: Secondary | ICD-10-CM

## 2011-12-08 LAB — POCT INR: INR: 2.4

## 2011-12-08 MED ORDER — DILTIAZEM HCL ER COATED BEADS 240 MG PO CP24: 240.0000 mg | ORAL_CAPSULE | Freq: Every day | ORAL | Status: DC

## 2011-12-08 NOTE — Assessment & Plan Note (Signed)
Blood pressure continues to be elevated. We will change amlodipine to diltiazem CD 240 mg daily to also help with her arrhythmia at nighttime.Marland Kitchen

## 2011-12-08 NOTE — Patient Instructions (Addendum)
Please hold amlodipine Start diltiazem one a day in its place, in the AM  Please call us if you have new issues that need to be addressed before your next appt.  Your physician wants you to follow-up in: 2 months.  You will receive a reminder letter in the mail two months in advance. If you don't receive a letter, please call our office to schedule the follow-up appointment.

## 2011-12-08 NOTE — Assessment & Plan Note (Signed)
She would benefit from starting a statin. This will be discussed in followup.

## 2011-12-08 NOTE — Assessment & Plan Note (Signed)
She has pacemaker interrogation in several weeks' time. This could potentially help identify her underlying arrhythmia .Currently on beta blocker, will start diltiazem. Can titrate beta blockers upwards as needed for arrhythmia.

## 2011-12-08 NOTE — Progress Notes (Signed)
Patient ID: Jacqueline Lozano, female    DOB: January 20, 1935, 77 y.o.   MRN: [id number]  HPI Comments: Jacqueline Lozano  Is a pleasant 77 year old woman with a history of Atrial fibrillation, status post arteriovenous node ablation, Pacer/CRT-P, Hypertension, Paroxysmal nocturnal dyspnea, Hypothyroidism, Diastolic congestive heart failure, C. difficile infection, who presents for followup after recent office visit for palpitations.  On her last clinic visit, she had significant tachycardia and arrhythmia most notably at nighttime. It was strong, loud, sometimes fast. She did go to the emergency room once for the symptoms and evaluation was essentially normal . We started metoprolol 50 mg twice a day on her last clinic visit and she reports significant improvement. She continues to have some episodes. She recalls 2 episodes of tachycardia that woke her from sleep. She is scheduled for followup with Dr. Burnett Sheng next week and Dr. Graciela Husbands for pacer check in several weeks. She does report episodes of chest pain that are rare. At least 2 episodes in June. Possibly associated with her fast rhythm.  Blood pressure numbers at home show systolic pressures in the 150-160 range, heart rate 75. She uses a wrist/radial cuff that is 75 years old.   EKG today shows paced rhythm at 75 beats per minute      Outpatient Encounter Prescriptions as of 12/08/2011  Medication Sig Dispense Refill  . budesonide-formoterol (SYMBICORT) 160-4.5 MCG/ACT inhaler Inhale 2 puffs into the lungs 2 (two) times daily.  1 Inhaler  3  . Butalbital-APAP-Caffeine (FIORICET PO) Take by mouth. Takes 2 tablets daily as needed.      Marland Kitchen COUMADIN 4 MG tablet TAKE AS DIRECTED BY ANTICOAGULATION CLINIC  60 tablet  3  . Cyanocobalamin (B-12 SL) Place under the tongue once a week.      . diphenoxylate-atropine (LOMOTIL) 2.5-0.025 MG per tablet Take 1 tablet by mouth 4 (four) times daily as needed.        . ergocalciferol (VITAMIN D2) 50000 UNITS capsule Take  50,000 Units by mouth once a week.        . famotidine (PEPCID) 20 MG tablet Take 20 mg by mouth 2 (two) times daily.      Marland Kitchen levothyroxine (SYNTHROID, LEVOTHROID) 125 MCG tablet Take 125 mcg by mouth daily.        . metoprolol (LOPRESSOR) 50 MG tablet Take 1 tablet (50 mg total) by mouth 2 (two) times daily.  60 tablet  6  . prochlorperazine (COMPAZINE) 5 MG tablet Take 5 mg by mouth every 6 (six) hours as needed.      Marland Kitchen amLODipine (NORVASC) 10 MG tablet Take 1 tablet (10 mg total) by mouth daily.  30 tablet  6    Review of Systems  Constitutional: Negative.   HENT: Negative.   Eyes: Negative.   Cardiovascular: Positive for palpitations.  Gastrointestinal: Negative.   Musculoskeletal: Negative.   Skin: Negative.   Neurological: Negative.   Hematological: Negative.   Psychiatric/Behavioral: Negative.   All other systems reviewed and are negative.    BP 140/78  Pulse 74  Ht 5\' 3"  (1.6 m)  Wt 200 lb 8 oz (90.946 kg)  BMI 35.52 kg/m2  Physical Exam  Nursing note and vitals reviewed. Constitutional: She is oriented to person, place, and time. She appears well-developed and well-nourished.  HENT:  Head: Normocephalic.  Nose: Nose normal.  Mouth/Throat: Oropharynx is clear and moist.  Eyes: Conjunctivae are normal. Pupils are equal, round, and reactive to light.  Neck: Normal range of motion. Neck supple.  No JVD present.  Cardiovascular: Normal rate, regular rhythm, S1 normal, S2 normal and intact distal pulses.  Exam reveals no gallop and no friction rub.   Murmur heard.  Crescendo systolic murmur is present with a grade of 2/6  Pulmonary/Chest: Effort normal and breath sounds normal. No respiratory distress. She has no wheezes. She has no rales. She exhibits no tenderness.  Abdominal: Soft. Bowel sounds are normal. She exhibits no distension. There is no tenderness.  Musculoskeletal: Normal range of motion. She exhibits no edema and no tenderness.  Lymphadenopathy:    She has  no cervical adenopathy.  Neurological: She is alert and oriented to person, place, and time. Coordination normal.  Skin: Skin is warm and dry. No rash noted. No erythema.  Psychiatric: She has a normal mood and affect. Her behavior is normal. Judgment and thought content normal.         Assessment and Plan

## 2011-12-15 ENCOUNTER — Other Ambulatory Visit: Payer: Self-pay | Admitting: *Deleted

## 2011-12-15 MED ORDER — PROCHLORPERAZINE MALEATE 5 MG PO TABS: 5.0000 mg | ORAL_TABLET | Freq: Four times a day (QID) | ORAL | Status: DC | PRN

## 2011-12-15 NOTE — Telephone Encounter (Signed)
Refilled Prochlorperazine.

## 2011-12-16 ENCOUNTER — Other Ambulatory Visit: Payer: Self-pay | Admitting: *Deleted

## 2011-12-16 MED ORDER — PROCHLORPERAZINE MALEATE 5 MG PO TABS: 5.0000 mg | ORAL_TABLET | Freq: Four times a day (QID) | ORAL | Status: DC | PRN

## 2011-12-16 NOTE — Telephone Encounter (Signed)
Refilled Prochlorperazine. 

## 2011-12-30 ENCOUNTER — Encounter: Payer: Self-pay | Admitting: Internal Medicine

## 2011-12-30 ENCOUNTER — Ambulatory Visit (INDEPENDENT_AMBULATORY_CARE_PROVIDER_SITE_OTHER): Payer: Medicare Other | Admitting: *Deleted

## 2011-12-30 DIAGNOSIS — Z950 Presence of cardiac pacemaker: Secondary | ICD-10-CM

## 2011-12-30 DIAGNOSIS — I442 Atrioventricular block, complete: Secondary | ICD-10-CM

## 2012-01-03 LAB — REMOTE PACEMAKER DEVICE
ATRIAL PACING PM: 0
LV LEAD IMPEDENCE PM: 551 Ohm
LV LEAD THRESHOLD: 1.375 V
RV LEAD IMPEDENCE PM: 513 Ohm
RV LEAD THRESHOLD: 0.75 V
VENTRICULAR PACING PM: 100

## 2012-01-12 ENCOUNTER — Ambulatory Visit (INDEPENDENT_AMBULATORY_CARE_PROVIDER_SITE_OTHER): Payer: Medicare Other

## 2012-01-12 ENCOUNTER — Other Ambulatory Visit: Payer: Self-pay

## 2012-01-12 ENCOUNTER — Telehealth: Payer: Self-pay | Admitting: Cardiovascular Disease

## 2012-01-12 VITALS — BP 140/72 | HR 78 | Ht 62.0 in | Wt 201.0 lb

## 2012-01-12 DIAGNOSIS — R42 Dizziness and giddiness: Secondary | ICD-10-CM

## 2012-01-12 DIAGNOSIS — Z7901 Long term (current) use of anticoagulants: Secondary | ICD-10-CM

## 2012-01-12 DIAGNOSIS — I4891 Unspecified atrial fibrillation: Secondary | ICD-10-CM

## 2012-01-12 DIAGNOSIS — R5381 Other malaise: Secondary | ICD-10-CM

## 2012-01-12 LAB — POCT INR: INR: 2.9

## 2012-01-12 NOTE — Patient Instructions (Addendum)
Try decreasing metoprolol by 1/2 but call if you have spells of SVT/palpitations Keep f/u as planned Call us if no improvement.

## 2012-01-12 NOTE — Telephone Encounter (Signed)
error 

## 2012-01-12 NOTE — Telephone Encounter (Signed)
Pt reports feeling more lightheaded than usual. She says she has been "runing into walls" at home and is unsure of meds she is taking. I advised her to come in for BP check and medication review. She will be here at 2 pm for appt

## 2012-01-12 NOTE — Telephone Encounter (Signed)
Pt called stating that she is very dizzy and says that she is walking into walls. Wants to talk about medication.

## 2012-01-12 NOTE — Progress Notes (Signed)
Pt c/o having to "grab onto walls" more lately.  She denies any other symptoms Orthostatics performed. Meds reviewed Discussed with Dr. Mariah Milling who suggests reassurance to pt She wonders if metoprolol may be causing these symptoms. I advised ok to try to take 1/2 dose to see if she notices a difference in symptoms.  I warned her this med is to help her episodes of SVT. If she begins to have more frequent spells of SVT with 1/2 dose, she should give [place] a call. Understanding verb.

## 2012-02-03 ENCOUNTER — Encounter: Payer: Self-pay | Admitting: *Deleted

## 2012-02-09 ENCOUNTER — Encounter: Payer: Self-pay | Admitting: Cardiovascular Disease

## 2012-02-09 ENCOUNTER — Ambulatory Visit: Payer: Medicare Other | Admitting: Cardiovascular Disease

## 2012-02-09 ENCOUNTER — Ambulatory Visit (INDEPENDENT_AMBULATORY_CARE_PROVIDER_SITE_OTHER): Payer: Medicare Other

## 2012-02-09 ENCOUNTER — Ambulatory Visit (INDEPENDENT_AMBULATORY_CARE_PROVIDER_SITE_OTHER): Payer: Medicare Other | Admitting: Cardiovascular Disease

## 2012-02-09 VITALS — BP 164/82 | HR 75 | Ht 63.0 in | Wt 203.5 lb

## 2012-02-09 DIAGNOSIS — Z7901 Long term (current) use of anticoagulants: Secondary | ICD-10-CM

## 2012-02-09 DIAGNOSIS — I4891 Unspecified atrial fibrillation: Secondary | ICD-10-CM

## 2012-02-09 DIAGNOSIS — R5381 Other malaise: Secondary | ICD-10-CM

## 2012-02-09 DIAGNOSIS — I10 Essential (primary) hypertension: Secondary | ICD-10-CM

## 2012-02-09 DIAGNOSIS — R5383 Other fatigue: Secondary | ICD-10-CM

## 2012-02-09 NOTE — Patient Instructions (Addendum)
You are doing well. No medication changes were made.  Please monitor your blood pressure at home Come in to the office to have your blood pressure cuff checked Increase exercise   Please call us if you have new issues that need to be addressed before your next appt.  Your physician wants you to follow-up in: 6 months.  You will receive a reminder letter in the mail two months in advance. If you don't receive a letter, please call our office to schedule the follow-up appointment.

## 2012-02-09 NOTE — Assessment & Plan Note (Signed)
Chronic fatigue and shortness of breath. Likely multifactorial including obesity, deconditioning, underlying arrhythmia with paced rhythm. No signs of heart failure on today's visit.

## 2012-02-09 NOTE — Progress Notes (Signed)
Patient ID: Jacqueline Lozano, female    DOB: 1934/10/03, 77 y.o.   MRN: [id number]  HPI Comments: Jacqueline Lozano  Is a pleasant 77 year old woman with a history of Atrial fibrillation, status post arteriovenous node ablation, Pacer/CRT-P, Hypertension, Paroxysmal nocturnal dyspnea, Hypothyroidism, Diastolic congestive heart failure, C. difficile infection, who presents for followup. Recent pacemaker interrogation shows that she is predominantly ventricularly paced.   She reports that she has had improvement of her tachycardia at nighttime by increasing her beta blocker.(" The pink pill") She continues to have poor balance," walks into walls", has a fuzzy feeling in her head.  This has been going on for some time. Continues to have problem with her weight, is very deconditioned and does not do any regular exercise. She does not walk long distances secondary to weakness and gait instability. Rare episodes of nonsustained VT on pacer interrogation.  She did buy a blood pressure cuff but has not learned how to use this yet. She is uncertain what her blood pressure is running at home   EKG today shows paced rhythm at 75 beats per minute      Outpatient Encounter Prescriptions as of 02/09/2012  Medication Sig Dispense Refill  . budesonide-formoterol (SYMBICORT) 160-4.5 MCG/ACT inhaler Inhale 2 puffs into the lungs 2 (two) times daily.  1 Inhaler  3  . Butalbital-APAP-Caffeine (FIORICET PO) Takes 2 tablets four times a day.      Marland Kitchen COUMADIN 4 MG tablet TAKE AS DIRECTED BY ANTICOAGULATION CLINIC  60 tablet  3  . Cyanocobalamin (B-12 SL) Place under the tongue once a week.      . diltiazem (CARDIZEM CD) 240 MG 24 hr capsule Take 1 capsule (240 mg total) by mouth daily.  30 capsule  6  . diphenoxylate-atropine (LOMOTIL) 2.5-0.025 MG per tablet Take 1 tablet by mouth 4 (four) times daily as needed.        . ergocalciferol (VITAMIN D2) 50000 UNITS capsule Take 50,000 Units by mouth once a week.        .  famotidine (PEPCID) 20 MG tablet Take 20 mg by mouth 2 (two) times daily.      Marland Kitchen levothyroxine (SYNTHROID, LEVOTHROID) 125 MCG tablet Take 125 mcg by mouth daily.        . metoprolol (LOPRESSOR) 50 MG tablet Take 1 tablet (50 mg total) by mouth 2 (two) times daily.  60 tablet  6  . prochlorperazine (COMPAZINE) 5 MG tablet Take 1 tablet (5 mg total) by mouth every 6 (six) hours as needed.  90 tablet  5     Review of Systems  Constitutional: Negative.   HENT: Negative.   Eyes: Negative.   Cardiovascular: Positive for palpitations.  Gastrointestinal: Negative.   Musculoskeletal: Negative.   Skin: Negative.   Neurological: Negative.   Hematological: Negative.   Psychiatric/Behavioral: Negative.   All other systems reviewed and are negative.    BP 164/82  Pulse 75  Ht 5\' 3"  (1.6 m)  Wt 203 lb 8 oz (92.307 kg)  BMI 36.05 kg/m2  Physical Exam  Nursing note and vitals reviewed. Constitutional: She is oriented to person, place, and time. She appears well-developed and well-nourished.       Obese  HENT:  Head: Normocephalic.  Nose: Nose normal.  Mouth/Throat: Oropharynx is clear and moist.  Eyes: Conjunctivae are normal. Pupils are equal, round, and reactive to light.  Neck: Normal range of motion. Neck supple. No JVD present.  Cardiovascular: Normal rate, regular rhythm, S1 normal, S2  normal and intact distal pulses.  Exam reveals no gallop and no friction rub.   Murmur heard.  Crescendo systolic murmur is present with a grade of 2/6  Pulmonary/Chest: Effort normal and breath sounds normal. No respiratory distress. She has no wheezes. She has no rales. She exhibits no tenderness.  Abdominal: Soft. Bowel sounds are normal. She exhibits no distension. There is no tenderness.  Musculoskeletal: Normal range of motion. She exhibits no edema and no tenderness.  Lymphadenopathy:    She has no cervical adenopathy.  Neurological: She is alert and oriented to person, place, and time.  Coordination normal.  Skin: Skin is warm and dry. No rash noted. No erythema.  Psychiatric: She has a normal mood and affect. Her behavior is normal. Judgment and thought content normal.         Assessment and Plan

## 2012-02-09 NOTE — Assessment & Plan Note (Signed)
Currently on warfarin, rate adequately controlled on current medications. Improved palpitations on higher dose metoprolol

## 2012-02-09 NOTE — Assessment & Plan Note (Signed)
I asked her to bring her blood pressure cuff to the office so we can teach her how to use it. I'm concerned her blood pressure is not well controlled.

## 2012-03-15 ENCOUNTER — Other Ambulatory Visit: Payer: Self-pay | Admitting: Cardiovascular Disease

## 2012-03-15 ENCOUNTER — Ambulatory Visit (INDEPENDENT_AMBULATORY_CARE_PROVIDER_SITE_OTHER): Payer: Medicare Other

## 2012-03-15 DIAGNOSIS — I4891 Unspecified atrial fibrillation: Secondary | ICD-10-CM

## 2012-03-15 DIAGNOSIS — Z7901 Long term (current) use of anticoagulants: Secondary | ICD-10-CM

## 2012-03-15 LAB — POCT INR: INR: 2.7

## 2012-03-15 MED ORDER — COUMADIN 4 MG PO TABS: ORAL_TABLET | ORAL | Status: DC

## 2012-04-11 ENCOUNTER — Encounter: Payer: Self-pay | Admitting: Internal Medicine

## 2012-04-11 ENCOUNTER — Ambulatory Visit (INDEPENDENT_AMBULATORY_CARE_PROVIDER_SITE_OTHER): Payer: Medicare Other | Admitting: *Deleted

## 2012-04-11 ENCOUNTER — Encounter: Payer: Self-pay | Admitting: *Deleted

## 2012-04-11 DIAGNOSIS — I4891 Unspecified atrial fibrillation: Secondary | ICD-10-CM

## 2012-04-11 DIAGNOSIS — I509 Heart failure, unspecified: Secondary | ICD-10-CM

## 2012-04-11 DIAGNOSIS — I442 Atrioventricular block, complete: Secondary | ICD-10-CM

## 2012-04-11 LAB — PACEMAKER DEVICE OBSERVATION
AL IMPEDENCE PM: 4047 Ohm
BATTERY VOLTAGE: 3.0006 V
RV LEAD AMPLITUDE: 5.25 mv
RV LEAD IMPEDENCE PM: 494 Ohm
RV LEAD THRESHOLD: 0.75 V
VENTRICULAR PACING PM: 100

## 2012-04-11 NOTE — Progress Notes (Signed)
PPM check with ICM 

## 2012-04-26 ENCOUNTER — Ambulatory Visit (INDEPENDENT_AMBULATORY_CARE_PROVIDER_SITE_OTHER): Payer: Medicare Other

## 2012-04-26 DIAGNOSIS — Z7901 Long term (current) use of anticoagulants: Secondary | ICD-10-CM

## 2012-04-26 DIAGNOSIS — I4891 Unspecified atrial fibrillation: Secondary | ICD-10-CM

## 2012-06-14 ENCOUNTER — Ambulatory Visit (INDEPENDENT_AMBULATORY_CARE_PROVIDER_SITE_OTHER): Payer: Medicare Other

## 2012-06-14 DIAGNOSIS — Z7901 Long term (current) use of anticoagulants: Secondary | ICD-10-CM

## 2012-06-14 DIAGNOSIS — I4891 Unspecified atrial fibrillation: Secondary | ICD-10-CM

## 2012-06-14 LAB — POCT INR: INR: 3.2

## 2012-06-23 ENCOUNTER — Ambulatory Visit (INDEPENDENT_AMBULATORY_CARE_PROVIDER_SITE_OTHER): Payer: Medicare Other | Admitting: *Deleted

## 2012-06-23 ENCOUNTER — Encounter: Payer: Self-pay | Admitting: Internal Medicine

## 2012-06-23 DIAGNOSIS — I509 Heart failure, unspecified: Secondary | ICD-10-CM

## 2012-06-23 DIAGNOSIS — I4891 Unspecified atrial fibrillation: Secondary | ICD-10-CM

## 2012-06-23 DIAGNOSIS — I442 Atrioventricular block, complete: Secondary | ICD-10-CM

## 2012-06-23 LAB — PACEMAKER DEVICE OBSERVATION
AL IMPEDENCE PM: 4047 Ohm
ATRIAL PACING PM: 0
BATTERY VOLTAGE: 2.9938 V
RV LEAD THRESHOLD: 0.75 V
VENTRICULAR PACING PM: 100

## 2012-06-23 NOTE — Patient Instructions (Addendum)
Return office visit 09/28/12 @ 1:45pm with Dr. Graciela Husbands.

## 2012-06-23 NOTE — Progress Notes (Signed)
PPM check 

## 2012-07-12 ENCOUNTER — Ambulatory Visit (INDEPENDENT_AMBULATORY_CARE_PROVIDER_SITE_OTHER): Payer: Medicare Other

## 2012-07-12 ENCOUNTER — Ambulatory Visit (INDEPENDENT_AMBULATORY_CARE_PROVIDER_SITE_OTHER): Payer: Medicare Other | Admitting: *Deleted

## 2012-07-12 VITALS — BP 140/90 | HR 75 | Ht 63.0 in | Wt 203.0 lb

## 2012-07-12 DIAGNOSIS — R079 Chest pain, unspecified: Secondary | ICD-10-CM

## 2012-07-12 DIAGNOSIS — I4891 Unspecified atrial fibrillation: Secondary | ICD-10-CM

## 2012-07-12 DIAGNOSIS — Z7901 Long term (current) use of anticoagulants: Secondary | ICD-10-CM

## 2012-07-12 LAB — POCT INR: INR: 2.9

## 2012-07-12 NOTE — Patient Instructions (Addendum)
Keep appointment with [place] tomorrow Go to ER if symptoms get worse

## 2012-07-12 NOTE — Progress Notes (Signed)
Pt c/o chest heaviness over the past few days. Says these symptoms are improved when she sits up at hs.  EKG was performed Reviewed with Dr. Mariah Milling No changes noted on EKG, per Dr. Mariah Milling He advised pt to f/u to further discuss  Appt made for pt tomm  I advised her to go to ER between now and in am if symptoms get worse/change Understanding verb

## 2012-07-13 ENCOUNTER — Ambulatory Visit (INDEPENDENT_AMBULATORY_CARE_PROVIDER_SITE_OTHER): Payer: Medicare Other | Admitting: Cardiovascular Disease

## 2012-07-13 ENCOUNTER — Encounter: Payer: Self-pay | Admitting: Cardiovascular Disease

## 2012-07-13 VITALS — BP 158/94 | HR 78 | Ht 62.0 in | Wt 198.8 lb

## 2012-07-13 DIAGNOSIS — R0602 Shortness of breath: Secondary | ICD-10-CM

## 2012-07-13 DIAGNOSIS — R079 Chest pain, unspecified: Secondary | ICD-10-CM

## 2012-07-13 DIAGNOSIS — R0789 Other chest pain: Secondary | ICD-10-CM

## 2012-07-13 DIAGNOSIS — I10 Essential (primary) hypertension: Secondary | ICD-10-CM

## 2012-07-13 MED ORDER — NITROGLYCERIN 0.4 MG SL SUBL: 0.4000 mg | SUBLINGUAL_TABLET | SUBLINGUAL | Status: DC | PRN

## 2012-07-13 NOTE — Progress Notes (Signed)
Patient ID: Jacqueline Lozano, female    DOB: 26-Feb-1935, 78 y.o.    MRN: [id number]  HPI Comments: Jacqueline Lozano  Is a pleasant 78 year old woman with a history of Atrial fibrillation, status post arteriovenous node ablation, Pacer/CRT-P, Hypertension, Paroxysmal nocturnal dyspnea, Hypothyroidism, Diastolic congestive heart failure, C. difficile infection, who presents for followup. Recent pacemaker interrogation shows that she is predominantly ventricularly paced.   Feb. 6, 2014: She presents today as a work in visit.  She presents with a 3 - 4 day hx of chest heaviness.   It is associated with dyspnea - especially when she walks to the mail box or is out shopping.  It feels like a big rock on her chest.  She doesn't remember if she has ever had a cath.  She has had NTG in the past - didn't like to take it because of the bad head ache it causes.     She has been having some pain at rest - 1 episode woke her up last week.   Outpatient Encounter Prescriptions as of 07/13/2012  Medication Sig Dispense Refill  . Alum & Mag Hydroxide-Simeth (DI-GEL PO) Take by mouth as needed.      . budesonide-formoterol (SYMBICORT) 160-4.5 MCG/ACT inhaler Inhale 2 puffs into the lungs 2 (two) times daily.  1 Inhaler  3  . Butalbital-APAP-Caffeine (FIORICET PO) Takes 2 tablets four times a day.      Marland Kitchen COUMADIN 4 MG tablet Take as directed by anticoagulation clinic  60 tablet  3  . Cyanocobalamin (B-12 SL) Place under the tongue once a week.      . diphenoxylate-atropine (LOMOTIL) 2.5-0.025 MG per tablet Take 1 tablet by mouth 4 (four) times daily as needed.        . ergocalciferol (VITAMIN D2) 50000 UNITS capsule Take 50,000 Units by mouth once a week.        . famotidine (PEPCID) 20 MG tablet Take 20 mg by mouth 2 (two) times daily.      Marland Kitchen levothyroxine (SYNTHROID, LEVOTHROID) 125 MCG tablet Take 125 mcg by mouth daily.        . metoprolol (LOPRESSOR) 50 MG tablet Take 1 tablet (50 mg total) by mouth 2 (two) times  daily.  60 tablet  6  . prochlorperazine (COMPAZINE) 5 MG tablet Take 1 tablet (5 mg total) by mouth every 6 (six) hours as needed.  90 tablet  5       BP 158/94  Pulse 78  Ht 5\' 2"  (1.575 m)  Wt 198 lb 12 oz (90.152 kg)  BMI 36.35 kg/m2  Physical Exam  Nursing note and vitals reviewed. Constitutional: She is oriented to person, place, and time. She appears well-developed and well-nourished.       Obese  HENT:  Head: Normocephalic.  Nose: Nose normal.  Mouth/Throat: Oropharynx is clear and moist.  Eyes: Conjunctivae are normal. Pupils are equal Neck: Normal range of motion. Neck supple. No JVD present.  Cardiovascular: RR, normal S1, S2, soft systolic murmur Pulmonary/Chest: Effort normal and breath sounds normal. No respiratory distress. She has no wheezes. She has no rales. She exhibits no tenderness.  Abdominal: Soft. Bowel sounds are normal. She exhibits no distension. There is no tenderness.  Musculoskeletal: Normal range of motion. She exhibits no edema and no tenderness.  Neurological: She is alert and oriented to person, place, and time. Coordination normal.  Skin: Skin is warm and dry. No rash noted. No erythema.  Psychiatric: She has a normal mood and  affect. Her behavior is normal. Judgment and thought content normal.         EKG: 07/13/2012: Biventricular pacer a rate of 78. She has associated ST and T wave changes.   Assessment and Plan

## 2012-07-13 NOTE — Assessment & Plan Note (Addendum)
Jacqueline Lozano is a 78 yo with hx of Atrial fib,  S/p pacer.  She presents today with episodic severe pain is somewhat disturbing. These episodes are described as a weight on her chest and last for several minutes. Some of the episodes last as long as many hours.  She had several severe episodes of pain a week ago but has not had significant significant pain this week. I don't think that we need to admit her to the hospital today.  We will schedule her for a Lexiscan Myoview study. We'll also give her prescription for nitroglycerin. She's had several stress tests in the past and states they're all negative. She does not recall ever getting a  heart catheterization.  We'll check a CBC and a basic blood profile today. She'll followup with Dr. Mariah Milling in one month.

## 2012-07-13 NOTE — Patient Instructions (Addendum)
Your physician wants you to follow-up in: 1 month with Dr. Mariah Milling. You will receive a reminder letter in the mail two months in advance. If you don't receive a letter, please call our office to schedule the follow-up appointment.   [facility] MYOVIEW  Your caregiver has ordered a Stress Test with nuclear imaging. The purpose of this test is to evaluate the blood supply to your heart muscle. This procedure is referred to as a "Non-Invasive Stress Test." This is because other than having an IV started in your vein, nothing is inserted or "invades" your body. Cardiac stress tests are done to find areas of poor blood flow to the heart by determining the extent of coronary artery disease (CAD). Some patients exercise on a treadmill, which naturally increases the blood flow to your heart, while others who are  unable to walk on a treadmill due to physical limitations have a pharmacologic/chemical stress agent called Lexiscan . This medicine will mimic walking on a treadmill by temporarily increasing your coronary blood flow.   Please note: these test may take anywhere between 2-4 hours to complete  PLEASE REPORT TO [facility]ICAL [facility] ENTRANCE  THE VOLUNTEERS AT THE FIRST DESK WILL DIRECT YOU WHERE TO GO  Date of Procedure:________2/12/14_____________________________  Arrival Time for Procedure:____7:45 am__________________________  Instructions regarding medication:   __n/a__ : Hold diabetes medication morning of procedure  _x___:  Hold betablocker(s) night before procedure and morning of procedure(metoprolol)  __n/a__:  Hold other medications as follows:_________________________________________________________________________________________________________________________________________________________________________________________________________________________________________________________________________________________  PLEASE NOTIFY THE OFFICE AT LEAST 24 HOURS IN ADVANCE IF YOU ARE UNABLE TO  KEEP YOUR APPOINTMENT.  [id number]  How to prepare for your Myoview test:  1. Do not eat or drink after midnight 2. No caffeine for 24 hours prior to test 3. Your medication may be taken with water.  If your doctor stopped a medication because of this test, do not take that medication. 4. Ladies, please do not wear dresses.  Skirts or pants are appropriate. Please wear a short sleeve shirt. 5. No perfume, cologne or lotion. 6. Wear comfortable walking shoes. No heels!

## 2012-07-14 LAB — CBC WITH DIFFERENTIAL/PLATELET
Basos: 0 % (ref 0–3)
Eos: 4 % (ref 0–5)
Immature Grans (Abs): 0 10*3/uL (ref 0.0–0.1)
Immature Granulocytes: 0 % (ref 0–2)
Lymphs: 52 % — ABNORMAL HIGH (ref 14–46)
MCV: 92 fL (ref 79–97)
Monocytes Absolute: 1 10*3/uL — ABNORMAL HIGH (ref 0.1–0.9)
Neutrophils Relative %: 34 % — ABNORMAL LOW (ref 40–74)
RBC: 4.19 x10E6/uL (ref 3.77–5.28)
RDW: 14 % (ref 12.3–15.4)
WBC: 9.3 10*3/uL (ref 3.4–10.8)

## 2012-07-14 LAB — BASIC METABOLIC PANEL
CO2: 25 mmol/L (ref 19–28)
Calcium: 9.6 mg/dL (ref 8.6–10.2)
Creatinine, Ser: 0.98 mg/dL (ref 0.57–1.00)
GFR calc non Af Amer: 57 mL/min/{1.73_m2} — ABNORMAL LOW (ref >59)
Glucose: 83 mg/dL (ref 65–99)
Potassium: 4.8 mmol/L (ref 3.5–5.2)
Sodium: 138 mmol/L (ref 134–144)

## 2012-07-17 ENCOUNTER — Other Ambulatory Visit: Payer: Self-pay

## 2012-07-17 DIAGNOSIS — R079 Chest pain, unspecified: Secondary | ICD-10-CM

## 2012-07-18 ENCOUNTER — Ambulatory Visit: Payer: Self-pay | Admitting: Cardiovascular Disease

## 2012-07-18 DIAGNOSIS — R079 Chest pain, unspecified: Secondary | ICD-10-CM

## 2012-07-24 ENCOUNTER — Telehealth: Payer: Self-pay

## 2012-07-24 ENCOUNTER — Other Ambulatory Visit: Payer: Self-pay

## 2012-07-24 DIAGNOSIS — R0602 Shortness of breath: Secondary | ICD-10-CM

## 2012-07-24 NOTE — Telephone Encounter (Signed)
"  schedule pt for echocardiogram for dx:SOB" VO Dr. Alvis Lemmings, RN

## 2012-07-24 NOTE — Telephone Encounter (Signed)
"  Myoview was normal" VO Dr. Alvis Lemmings, RN

## 2012-07-24 NOTE — Telephone Encounter (Signed)
Pt informed Says Dr. Mariah Milling mentioned  Another test" to evaluate echo I explained I am unaware of this test and we will ask him and call her back

## 2012-07-24 NOTE — Telephone Encounter (Signed)
Can you please schedule?

## 2012-07-25 ENCOUNTER — Other Ambulatory Visit: Payer: Self-pay | Admitting: *Deleted

## 2012-07-25 DIAGNOSIS — R079 Chest pain, unspecified: Secondary | ICD-10-CM

## 2012-07-27 ENCOUNTER — Other Ambulatory Visit: Payer: Self-pay | Admitting: Cardiovascular Disease

## 2012-07-28 NOTE — Telephone Encounter (Signed)
Please review and refill, Thank You. 

## 2012-07-29 ENCOUNTER — Other Ambulatory Visit: Payer: Self-pay | Admitting: Cardiovascular Disease

## 2012-07-31 NOTE — Telephone Encounter (Signed)
Please review for refill.  

## 2012-08-09 ENCOUNTER — Ambulatory Visit (INDEPENDENT_AMBULATORY_CARE_PROVIDER_SITE_OTHER): Payer: Medicare Other

## 2012-08-09 DIAGNOSIS — I4891 Unspecified atrial fibrillation: Secondary | ICD-10-CM

## 2012-08-09 DIAGNOSIS — Z7901 Long term (current) use of anticoagulants: Secondary | ICD-10-CM

## 2012-08-09 LAB — POCT INR: INR: 2.9

## 2012-08-15 ENCOUNTER — Telehealth: Payer: Self-pay | Admitting: *Deleted

## 2012-08-15 NOTE — Telephone Encounter (Signed)
Pt says she saw on CNN yesterday x 2 that some of the lopressor tablets, among other medications, have been "seized" due to them being "counterfeit.  I explained we have not been made aware of this and was probably talking about a particular shipment of lopressor. I explained, if she was on e of the patients involved, the company would have contacted her. I advised her to call pharmacy to give her reassurance but advised against her stopping her meds. I explained the risk is higher if she stops this med compared to it being "counterfeit" She will resume med and call pharmacy to see if they are familiar with this issue

## 2012-08-15 NOTE — Telephone Encounter (Signed)
Pt stopped her lopressor because she saw on "TV" that all lopressors are counterfeit.

## 2012-08-16 ENCOUNTER — Ambulatory Visit: Payer: Medicare Other | Admitting: Cardiovascular Disease

## 2012-08-18 ENCOUNTER — Other Ambulatory Visit (INDEPENDENT_AMBULATORY_CARE_PROVIDER_SITE_OTHER): Payer: Medicare Other

## 2012-08-18 ENCOUNTER — Other Ambulatory Visit: Payer: Self-pay

## 2012-08-18 DIAGNOSIS — R0602 Shortness of breath: Secondary | ICD-10-CM

## 2012-08-18 DIAGNOSIS — R0789 Other chest pain: Secondary | ICD-10-CM

## 2012-08-29 ENCOUNTER — Ambulatory Visit (INDEPENDENT_AMBULATORY_CARE_PROVIDER_SITE_OTHER): Payer: Medicare Other | Admitting: Cardiovascular Disease

## 2012-08-29 ENCOUNTER — Ambulatory Visit (INDEPENDENT_AMBULATORY_CARE_PROVIDER_SITE_OTHER): Payer: Medicare Other

## 2012-08-29 ENCOUNTER — Other Ambulatory Visit: Payer: Medicare Other

## 2012-08-29 ENCOUNTER — Encounter: Payer: Self-pay | Admitting: Cardiovascular Disease

## 2012-08-29 VITALS — BP 160/90 | HR 77 | Ht 62.0 in | Wt 200.5 lb

## 2012-08-29 DIAGNOSIS — I10 Essential (primary) hypertension: Secondary | ICD-10-CM

## 2012-08-29 DIAGNOSIS — I4891 Unspecified atrial fibrillation: Secondary | ICD-10-CM

## 2012-08-29 DIAGNOSIS — R0602 Shortness of breath: Secondary | ICD-10-CM

## 2012-08-29 DIAGNOSIS — Z7901 Long term (current) use of anticoagulants: Secondary | ICD-10-CM

## 2012-08-29 LAB — POCT INR: INR: 3.3

## 2012-08-29 MED ORDER — AMLODIPINE BESYLATE 10 MG PO TABS: 10.0000 mg | ORAL_TABLET | Freq: Every day | ORAL | Status: DC

## 2012-08-29 NOTE — Patient Instructions (Addendum)
Please monitor your blood pressure Goal blood pressure is less than 140 on the top, less than 90 on the bottom  Start amlodipine one a day Continue to monitor your  Blood pressure  We will schedule cardiac rehab at [facility] for shortness of breath  Please call us if you have new issues that need to be addressed before your next appt.  Your physician wants you to follow-up in: 1 months.

## 2012-08-29 NOTE — Assessment & Plan Note (Signed)
Severely elevated. Will start amlodipine 10 mg daily. Additional medications depending on followup blood pressure measurements.

## 2012-08-29 NOTE — Assessment & Plan Note (Addendum)
Long-standing shortness of breath. Possibly exacerbated by her severe hypertension on today's visit. We will restart amlodipine. She was previously on this before being changed to diltiazem. Diltiazem has since been discontinued. She will monitor her blood pressure at home. I suspect she will need additional medications.  We will also enroll her in cardiac rehabilitation. I suspect her weight and deconditioning is playing a role in her shortness of breath.

## 2012-08-29 NOTE — Progress Notes (Signed)
Patient ID: Jacqueline Lozano, female    DOB: 09-08-1934, 78 y.o.   MRN: [id number]  HPI Comments: Jacqueline Lozano  Is a pleasant 78 -year-old woman with a history of Atrial fibrillation, status post arteriovenous node ablation, Pacer/CRT-P, Hypertension, Paroxysmal nocturnal dyspnea, Hypothyroidism, Diastolic congestive heart failure, C. difficile infection, who presents for followup. Recent pacemaker interrogation shows that she is predominantly ventricularly paced.   She reports continued shortness of breath. This has been a chronic issue for the past several months. She is very debilitated, unable to work very far, unable to shop and she would like to. She denies any edema, coughing. No PND or orthopnea. Recent stress test showed no ischemia Echocardiogram showed normal LV function, right ventricular systolic pressure high normal.  She has not been checking her blood pressure at home. Blood pressure here today is very elevated EKG today shows paced rhythm at 77 beats per minute      Outpatient Encounter Prescriptions as of 08/29/2012  Medication Sig Dispense Refill  . Alum & Mag Hydroxide-Simeth (DI-GEL PO) Take by mouth as needed.      . budesonide-formoterol (SYMBICORT) 160-4.5 MCG/ACT inhaler Inhale 2 puffs into the lungs 2 (two) times daily.  1 Inhaler  3  . Butalbital-APAP-Caffeine (FIORICET PO) Takes 2 tablets four times a day.      Marland Kitchen COUMADIN 4 MG tablet TAKE AS DIRECTED BY ANTICOAGULATION CLINIC  60 tablet  3  . COUMADIN 4 MG tablet TAKE AS DIRECTED BY ANTICOAGULATION CLINIC  60 tablet  3  . Cyanocobalamin (B-12 SL) Place under the tongue once a week.      . diphenoxylate-atropine (LOMOTIL) 2.5-0.025 MG per tablet Take 1 tablet by mouth 4 (four) times daily as needed.        . ergocalciferol (VITAMIN D2) 50000 UNITS capsule Take 50,000 Units by mouth once a week.        . famotidine (PEPCID) 20 MG tablet Take 20 mg by mouth 2 (two) times daily.      Marland Kitchen levothyroxine (SYNTHROID,  LEVOTHROID) 125 MCG tablet Take 125 mcg by mouth daily.        . metoprolol (LOPRESSOR) 50 MG tablet Take 1 tablet (50 mg total) by mouth 2 (two) times daily.  60 tablet  6  . nitroGLYCERIN (NITROSTAT) 0.4 MG SL tablet Place 1 tablet (0.4 mg total) under the tongue every 5 (five) minutes as needed for chest pain.  25 tablet  3  . prochlorperazine (COMPAZINE) 5 MG tablet Take 1 tablet (5 mg total) by mouth every 6 (six) hours as needed.  90 tablet  5  . amLODipine (NORVASC) 10 MG tablet Take 1 tablet (10 mg total) by mouth daily.  30 tablet  6   No facility-administered encounter medications on file as of 08/29/2012.     Review of Systems  Constitutional: Negative.   HENT: Negative.   Eyes: Negative.   Respiratory: Positive for shortness of breath.   Cardiovascular: Positive for palpitations.  Gastrointestinal: Negative.   Musculoskeletal: Negative.   Skin: Negative.   Neurological: Negative.   Psychiatric/Behavioral: Negative.   All other systems reviewed and are negative.    BP 160/90  Pulse 77  Ht 5\' 2"  (1.575 m)  Wt 200 lb 8 oz (90.946 kg)  BMI 36.66 kg/m2 Repeat blood pressure shows systolic pressure 190/110  Physical Exam  Nursing note and vitals reviewed. Constitutional: She is oriented to person, place, and time. She appears well-developed and well-nourished.  Obese  HENT:  Head: Normocephalic.  Nose: Nose normal.  Mouth/Throat: Oropharynx is clear and moist.  Eyes: Conjunctivae are normal. Pupils are equal, round, and reactive to light.  Neck: Normal range of motion. Neck supple. No JVD present.  Cardiovascular: Normal rate, regular rhythm, S1 normal, S2 normal and intact distal pulses.  Exam reveals no gallop and no friction rub.   Murmur heard.  Crescendo systolic murmur is present with a grade of 2/6  Pulmonary/Chest: Effort normal and breath sounds normal. No respiratory distress. She has no wheezes. She has no rales. She exhibits no tenderness.  Abdominal:  Soft. Bowel sounds are normal. She exhibits no distension. There is no tenderness.  Musculoskeletal: Normal range of motion. She exhibits no edema and no tenderness.  Lymphadenopathy:    She has no cervical adenopathy.  Neurological: She is alert and oriented to person, place, and time. Coordination normal.  Skin: Skin is warm and dry. No rash noted. No erythema.  Psychiatric: She has a normal mood and affect. Her behavior is normal. Judgment and thought content normal.    Assessment and Plan

## 2012-08-30 LAB — POCT INR: INR: 3.3

## 2012-09-06 ENCOUNTER — Ambulatory Visit: Payer: Medicare Other | Admitting: Cardiovascular Disease

## 2012-09-27 ENCOUNTER — Ambulatory Visit (INDEPENDENT_AMBULATORY_CARE_PROVIDER_SITE_OTHER): Payer: Medicare Other

## 2012-09-27 DIAGNOSIS — Z7901 Long term (current) use of anticoagulants: Secondary | ICD-10-CM

## 2012-09-27 DIAGNOSIS — I4891 Unspecified atrial fibrillation: Secondary | ICD-10-CM

## 2012-09-28 ENCOUNTER — Ambulatory Visit (INDEPENDENT_AMBULATORY_CARE_PROVIDER_SITE_OTHER): Payer: Medicare Other | Admitting: Internal Medicine

## 2012-09-28 ENCOUNTER — Encounter: Payer: Self-pay | Admitting: Internal Medicine

## 2012-09-28 VITALS — BP 140/80 | HR 75 | Ht 62.0 in | Wt 198.0 lb

## 2012-09-28 DIAGNOSIS — Z950 Presence of cardiac pacemaker: Secondary | ICD-10-CM

## 2012-09-28 DIAGNOSIS — I4891 Unspecified atrial fibrillation: Secondary | ICD-10-CM

## 2012-09-28 DIAGNOSIS — R0602 Shortness of breath: Secondary | ICD-10-CM

## 2012-09-28 DIAGNOSIS — R079 Chest pain, unspecified: Secondary | ICD-10-CM

## 2012-09-28 LAB — PACEMAKER DEVICE OBSERVATION
ATRIAL PACING PM: 0
LV LEAD THRESHOLD: 2.5 V
RV LEAD IMPEDENCE PM: 494 Ohm
RV LEAD THRESHOLD: 0.75 V
VENTRICULAR PACING PM: 100

## 2012-09-28 NOTE — Assessment & Plan Note (Signed)
Permanent on anticoagulation 

## 2012-09-28 NOTE — Assessment & Plan Note (Signed)
The patient's device was interrogated and the information was fully reviewed.  The device was reprogrammed to  Increase sensitivity for rateresponse

## 2012-09-28 NOTE — Patient Instructions (Addendum)
Your physician wants you to follow-up in: 3 months with Dr. Graciela Husbands and device check. You will receive a reminder letter in the mail two months in advance. If you don't receive a letter, please call our office to schedule the follow-up appointment.

## 2012-09-28 NOTE — Assessment & Plan Note (Signed)
She desats to 61 with walking. She is very little heart rate excursion. We adjusted this but that had no impact on her dyspnea. It may be issued benefit from oxygen.

## 2012-09-29 NOTE — Progress Notes (Signed)
HPI  Jacqueline Lozano is a 78 y.o. female  Seen in followup for AV ablation in for control of paroxysmal atrial fibrillation with rapic ventricular response; at the same time (January 2012) she underwent CRT P. Implantation.  In the past she has failed sotalol amiodarone and multiple rate controlling meds we have seen her a few months ago we worked on reprogrammed her device which improved her breathing considerably allowing her to walk on 100-150 feet as opposed to 10 and 15 feet. We treid VV optimization and she saw pulmonary who felt this was largely deconditioning.   Her dyspnea remains her major complaint.    Past Medical History  Diagnosis Date  . Hypertension   . Hypothyroidism   . GERD (gastroesophageal reflux disease)   . Migraine   . OSA (obstructive sleep apnea)     Not consistently using CPAP  . Allergic to IV contrast   . PAD (peripheral artery disease)     Occlusive disease involving left PT and AT  . Hyperlipidemia     Has been unable to take statins due to muscle pain. Has tried multiple statins per her report.  . Abnormal echocardiogram 09/2009    EF >55%, mild LVH, moderate LAE, normal RV, normal PA systolic pressure  . Paroxysmal atrial fibrillation     Breakthrough a fib on sotalol, changed to amiodarone 4/11. DCCV 4/11  . Congestive heart failure, unspecified     Past Surgical History  Procedure Laterality Date  . Cardiac catheterization      Left heart cath x3 in teh past, last 5 yrs ago. All "normal" per pt report  . Pacemaker insertion  06/12/2010  . Atrial ablation surgery  06/12/2010  . Appendectomy    . Cholecystectomy open    . Vesicovaginal fistula closure w/ tah    . Carpal tunnel release    . Cardioversion      Current Outpatient Prescriptions  Medication Sig Dispense Refill  . Alum & Mag Hydroxide-Simeth (DI-GEL PO) Take by mouth as needed.      Marland Kitchen amLODipine (NORVASC) 10 MG tablet Take 1 tablet (10 mg total) by mouth daily.  30 tablet  6   . budesonide-formoterol (SYMBICORT) 160-4.5 MCG/ACT inhaler Inhale 2 puffs into the lungs 2 (two) times daily.  1 Inhaler  3  . Butalbital-APAP-Caffeine (FIORICET PO) Takes 2 tablets four times a day.      Marland Kitchen COUMADIN 4 MG tablet TAKE AS DIRECTED BY ANTICOAGULATION CLINIC  60 tablet  3  . COUMADIN 4 MG tablet TAKE AS DIRECTED BY ANTICOAGULATION CLINIC  60 tablet  3  . Cyanocobalamin (B-12 SL) Place under the tongue once a week.      . diphenoxylate-atropine (LOMOTIL) 2.5-0.025 MG per tablet Take 1 tablet by mouth 4 (four) times daily as needed.        . ergocalciferol (VITAMIN D2) 50000 UNITS capsule Take 50,000 Units by mouth once a week.        . famotidine (PEPCID) 20 MG tablet Take 20 mg by mouth 2 (two) times daily.      Marland Kitchen levothyroxine (SYNTHROID, LEVOTHROID) 125 MCG tablet Take 125 mcg by mouth daily.        . metoprolol (LOPRESSOR) 50 MG tablet Take 1 tablet (50 mg total) by mouth 2 (two) times daily.  60 tablet  6  . nitroGLYCERIN (NITROSTAT) 0.4 MG SL tablet Place 1 tablet (0.4 mg total) under the tongue every 5 (five) minutes as needed for chest pain.  25 tablet  3  . prochlorperazine (COMPAZINE) 5 MG tablet Take 1 tablet (5 mg total) by mouth every 6 (six) hours as needed.  90 tablet  5   No current facility-administered medications for this visit.    Allergies  Allergen Reactions  . Codeine   . Iodine   . Morphine   . Oxycodone-Acetaminophen   . Persantine (Dipyridamole)   . Statins   . Sulfonamide Derivatives     Review of Systems negative except from HPI and PMH  Physical Exam BP 140/80  Pulse 75  Ht 5\' 2"  (1.575 m)  Wt 198 lb (89.812 kg)  BMI 36.21 kg/m2 Well developed and well nourished in mod resp distress Neck Supple JVP flat; carotids brisk and full Clear to ausculation Regular rate and rhythm, no murmurs gallops or rub Soft with active bowel sounds No clubbing cyanosis none Edema Alert and oriented, grossly normal motor and sensory function Skin Warm  and Dry   Assessment and  Plan

## 2012-10-17 ENCOUNTER — Encounter: Payer: Self-pay | Admitting: Cardiovascular Disease

## 2012-10-25 ENCOUNTER — Ambulatory Visit (INDEPENDENT_AMBULATORY_CARE_PROVIDER_SITE_OTHER): Payer: Medicare Other

## 2012-10-25 DIAGNOSIS — I4891 Unspecified atrial fibrillation: Secondary | ICD-10-CM

## 2012-10-25 DIAGNOSIS — Z7901 Long term (current) use of anticoagulants: Secondary | ICD-10-CM

## 2012-10-25 LAB — POCT INR: INR: 2.3

## 2012-10-25 MED ORDER — COUMADIN 4 MG PO TABS: ORAL_TABLET | ORAL | Status: DC

## 2012-11-05 ENCOUNTER — Encounter: Payer: Self-pay | Admitting: Cardiovascular Disease

## 2012-11-16 ENCOUNTER — Encounter: Payer: Self-pay | Admitting: Cardiovascular Disease

## 2012-11-29 ENCOUNTER — Ambulatory Visit (INDEPENDENT_AMBULATORY_CARE_PROVIDER_SITE_OTHER): Payer: Medicare Other

## 2012-11-29 DIAGNOSIS — I4891 Unspecified atrial fibrillation: Secondary | ICD-10-CM

## 2012-11-29 DIAGNOSIS — Z7901 Long term (current) use of anticoagulants: Secondary | ICD-10-CM

## 2012-11-29 LAB — POCT INR: INR: 3

## 2012-12-05 ENCOUNTER — Telehealth: Payer: Self-pay

## 2012-12-05 ENCOUNTER — Encounter: Payer: Self-pay | Admitting: Cardiovascular Disease

## 2012-12-05 NOTE — Telephone Encounter (Signed)
dtr asks if pt should be taking both amlodipine and lopressor Says she has not been taking amlodipine but has been taking metoprolol as prescribed I advised, per Dr. Windell Hummingbird last note, she should be on both. She should monitor her BP while taking both and let us know should this run low Understanding verb

## 2012-12-05 NOTE — Telephone Encounter (Signed)
Patient is confused on her medications amlodpine and metoprolol. The patient is taking Metoprolol tart 50 mg 1 and 1/2 tablet twice a day. Please contact daughter Cordelia Pen) with recommendations [phone]. Please call patient's daughter today. The patient has been confused and has a follow with Dr. Burnett Sheng tomorrow to discuss the confusion.

## 2012-12-05 NOTE — Telephone Encounter (Signed)
Pt daughter called regarding BP medicine. Please call she is taking metoprolol, she is not taking amlodipine(states this is listed on her med sheet, but is not taking it) Pt daughter asked for sharon to call her back

## 2012-12-06 NOTE — Telephone Encounter (Signed)
hasn't been checking but will start

## 2012-12-06 NOTE — Telephone Encounter (Signed)
What has blood pressure been running without the medication, if she has been checking?

## 2012-12-18 ENCOUNTER — Encounter: Payer: Self-pay | Admitting: Internal Medicine

## 2012-12-18 ENCOUNTER — Ambulatory Visit (INDEPENDENT_AMBULATORY_CARE_PROVIDER_SITE_OTHER): Payer: Medicare Other | Admitting: Internal Medicine

## 2012-12-18 VITALS — BP 151/89 | HR 77 | Ht 62.0 in | Wt 200.5 lb

## 2012-12-18 DIAGNOSIS — R0602 Shortness of breath: Secondary | ICD-10-CM

## 2012-12-18 DIAGNOSIS — I4891 Unspecified atrial fibrillation: Secondary | ICD-10-CM

## 2012-12-18 DIAGNOSIS — I442 Atrioventricular block, complete: Secondary | ICD-10-CM

## 2012-12-18 DIAGNOSIS — I509 Heart failure, unspecified: Secondary | ICD-10-CM

## 2012-12-18 LAB — PACEMAKER DEVICE OBSERVATION
RV LEAD AMPLITUDE: 5.25 mv
RV LEAD IMPEDENCE PM: 475 Ohm
RV LEAD THRESHOLD: 0.875 V
VENTRICULAR PACING PM: 98.21

## 2012-12-18 NOTE — Patient Instructions (Addendum)
Your physician wants you to follow-up in: 6 months with [name]. You will receive a reminder letter in the mail two months in advance. If you don't receive a letter, please call our office to schedule the follow-up appointment.

## 2012-12-18 NOTE — Assessment & Plan Note (Signed)
Somewhat improved exercise tolerance based on changes in the activity index    We will reassess oxygen saturation. I wonder whether she wouldn't benefit from oxygen at home

## 2012-12-18 NOTE — Assessment & Plan Note (Signed)
Permanent on anticoagulation with reasonable rate control

## 2012-12-18 NOTE — Assessment & Plan Note (Signed)
Stable post pacing 

## 2012-12-18 NOTE — Progress Notes (Signed)
Patient Care Team: Jerl Mina, MD as PCP - General (Family Medicine)   HPI  Jacqueline Lozano is a 78 y.o. female Seen in followup for AV ablation in for control of paroxysmal atrial fibrillation with rapic ventricular response; at the same time (January 2012) she underwent CRT P. Implantation.    In the past she had failed sotalol amiodarone and multiple rate controlling meds.  Over the months we have tried reprogrammed her device we worked on reprogrammed her device which transiently Improved her breathing quite considerably  allowing her to walk on 100-150 feet as opposed to 10 and 15 feet only to see this benefit disappear over time.  .We treid VV optimization and she saw pulmonary who felt this was largely deconditioning. At her last visit we measure that she desaturated into the low  80s with walking  More recently she has had problems with confusion. She's scheduled to see neurology later this week. There has been no change in her edema her shortness of breath.she is not using oxygen.     Past Medical History  Diagnosis Date  . Hypertension   . Hypothyroidism   . GERD (gastroesophageal reflux disease)   . Migraine   . OSA (obstructive sleep apnea)     Not consistently using CPAP  . Allergic to IV contrast   . PAD (peripheral artery disease)     Occlusive disease involving left PT and AT  . Hyperlipidemia     Has been unable to take statins due to muscle pain. Has tried multiple statins per her report.  . Abnormal echocardiogram 09/2009    EF >55%, mild LVH, moderate LAE, normal RV, normal PA systolic pressure  . Paroxysmal atrial fibrillation     Breakthrough a fib on sotalol, changed to amiodarone 4/11. DCCV 4/11  . Congestive heart failure, unspecified     Past Surgical History  Procedure Laterality Date  . Cardiac catheterization      Left heart cath x3 in teh past, last 5 yrs ago. All "normal" per pt report  . Pacemaker insertion  06/12/2010  . Atrial ablation  surgery  06/12/2010  . Appendectomy    . Cholecystectomy open    . Vesicovaginal fistula closure w/ tah    . Carpal tunnel release    . Cardioversion      Current Outpatient Prescriptions  Medication Sig Dispense Refill  . Alum & Mag Hydroxide-Simeth (DI-GEL PO) Take by mouth as needed.      . budesonide-formoterol (SYMBICORT) 160-4.5 MCG/ACT inhaler Inhale 2 puffs into the lungs 2 (two) times daily as needed.      Marland Kitchen Butalbital-APAP-Caffeine (FIORICET PO) Takes 2 tablets four times a day.      Marland Kitchen COUMADIN 4 MG tablet Take as directed by anticoagulation clinic  60 tablet  3  . diphenoxylate-atropine (LOMOTIL) 2.5-0.025 MG per tablet Take 1 tablet by mouth 4 (four) times daily as needed.        . famotidine (PEPCID) 20 MG tablet Take 20 mg by mouth 2 (two) times daily.      Marland Kitchen levothyroxine (SYNTHROID, LEVOTHROID) 112 MCG tablet Take 112 mcg by mouth daily before breakfast.      . metoprolol (LOPRESSOR) 50 MG tablet Take 75 mg by mouth 2 (two) times daily.      . nitroGLYCERIN (NITROSTAT) 0.4 MG SL tablet Place 1 tablet (0.4 mg total) under the tongue every 5 (five) minutes as needed for chest pain.  25 tablet  3  . prochlorperazine (COMPAZINE)  5 MG tablet Take 1 tablet (5 mg total) by mouth every 6 (six) hours as needed.  90 tablet  5  . VITAMIN D, CHOLECALCIFEROL, PO Take 5,000 Units by mouth 3 (three) times a week.       No current facility-administered medications for this visit.    Allergies  Allergen Reactions  . Codeine   . Iodine   . Morphine   . Oxycodone-Acetaminophen   . Persantine (Dipyridamole)   . Statins   . Sulfonamide Derivatives     Review of Systems negative except from HPI and PMH  Physical Exam BP 151/89  Pulse 77  Ht 5\' 2"  (1.575 m)  Wt 200 lb 8 oz (90.946 kg)  BMI 36.66 kg/m2 Well developed and well nourished in no acute distress HENT normal E scleral and icterus clear Neck Supple JVP flat; carotids brisk and full Clear to ausculation Regular rate  and rhythm with a 2/6 systolic murmur Soft with active bowel sounds No clubbing cyanosis none Edema Alert and oriented, grossly normal motor and sensory function Skin Warm and Dry  ECG demonstrates ventricular pacing and underlying atrial fibrillation  Assessment and  Plan

## 2012-12-18 NOTE — Assessment & Plan Note (Signed)
Euvolemic. Continue current meds 

## 2012-12-25 ENCOUNTER — Ambulatory Visit: Payer: Self-pay | Admitting: Neurology

## 2012-12-27 ENCOUNTER — Other Ambulatory Visit: Payer: Self-pay | Admitting: Cardiovascular Disease

## 2012-12-27 NOTE — Telephone Encounter (Signed)
Called pt and informed of refill sent and she states understanding

## 2012-12-27 NOTE — Telephone Encounter (Signed)
Please review and refill, Thank you. 

## 2012-12-28 IMAGING — CR DG CHEST 2V
2 series · 2 of 2 positions shown · non-contrast
Comparison: None.

CLINICAL DATA: LV dysfunction, pre procedure respiratory exam.
Slight shortness of breath.

CHEST - 2 VIEW

[view not recorded (1 of 2)]
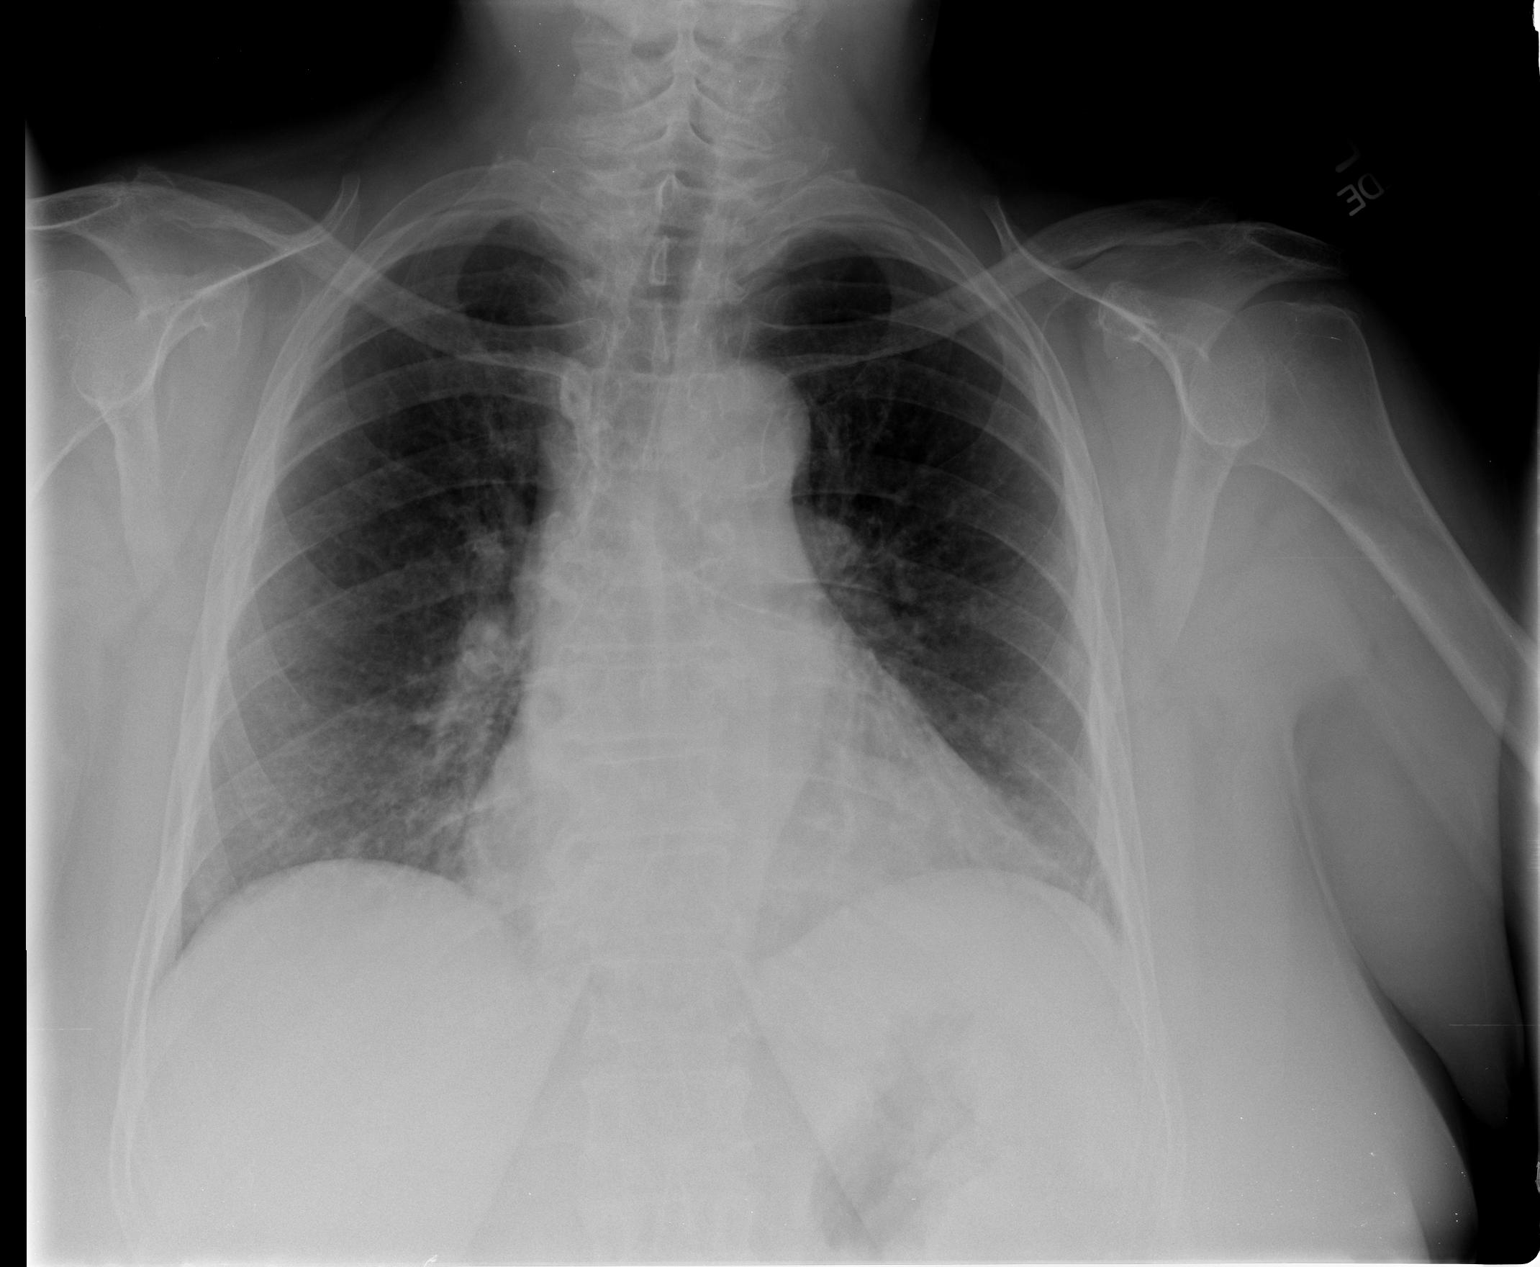

[view not recorded (2 of 2)]
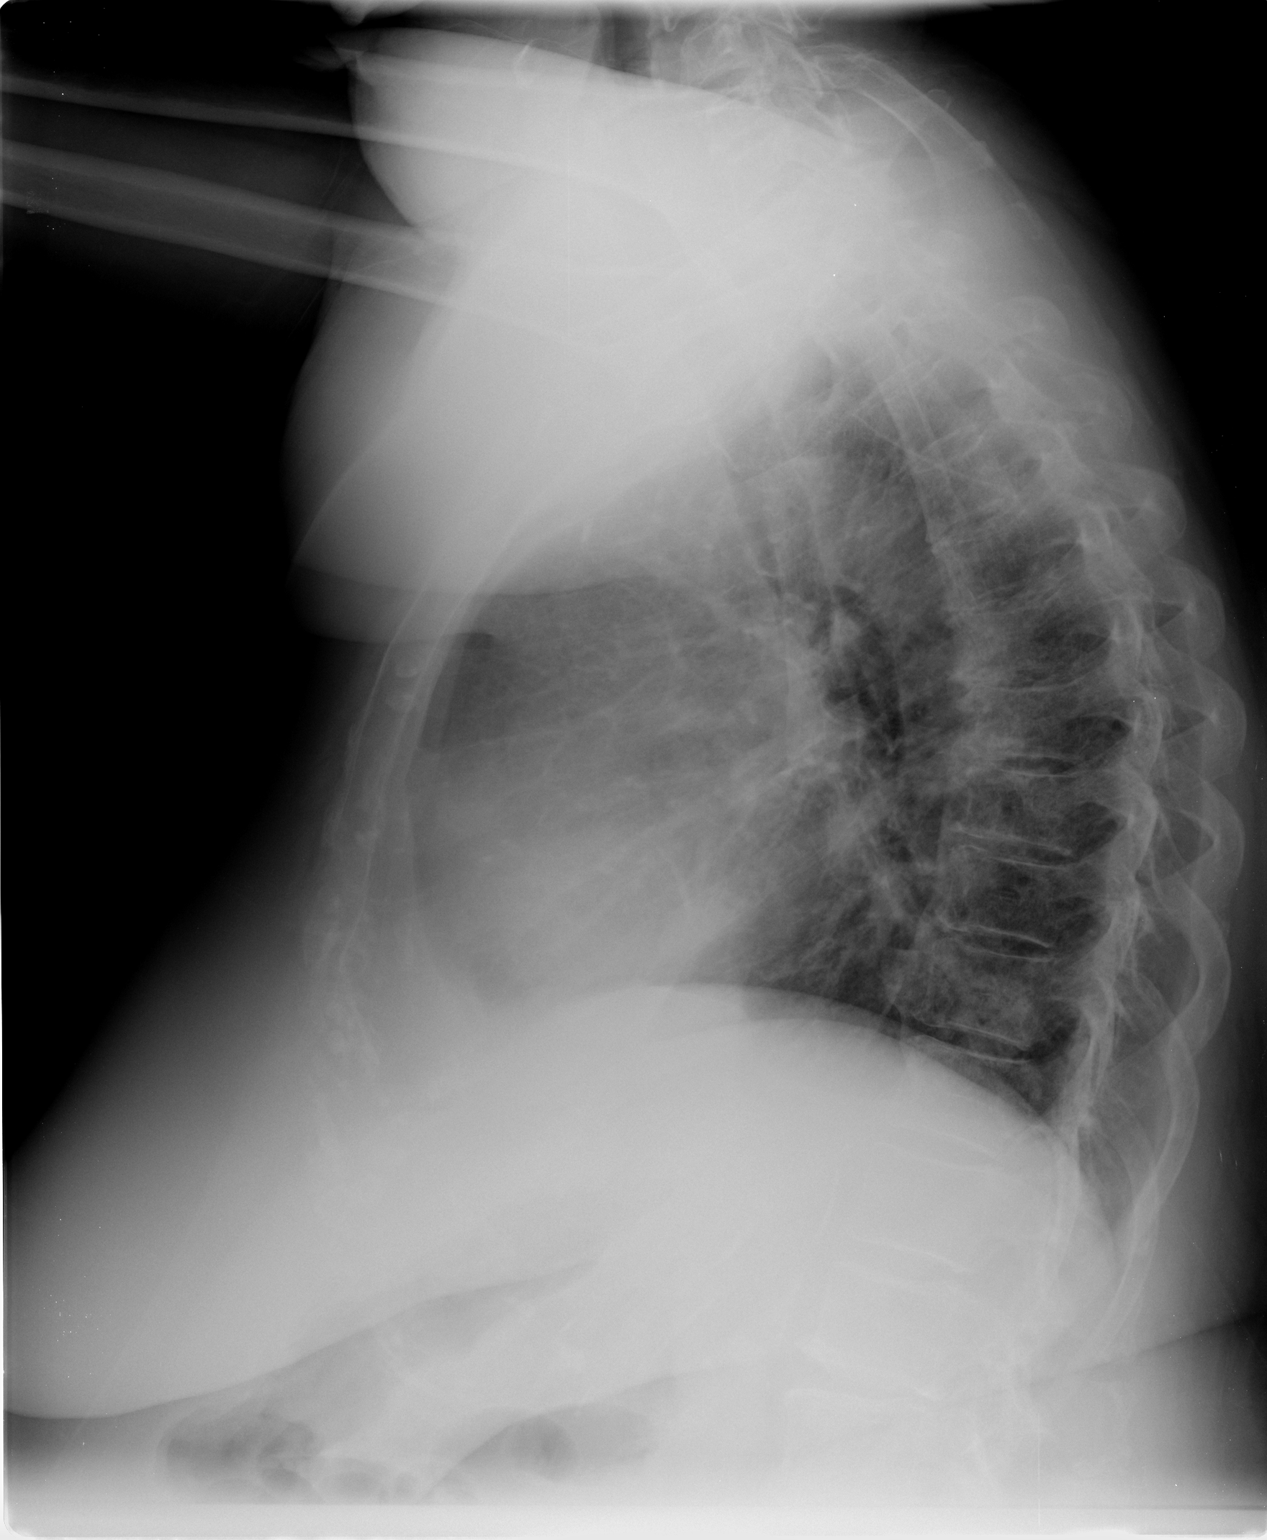

[2 of 2 positions shown; findings below may reference images not displayed]

FINDINGS: Low lung volumes.  Clear lungs.  Borderline cardiomegaly.
Slight aortic calcification.  Mediastinum, hila, pleura and osseous
structures unremarkable.
IMPRESSION: 1.  Low lung volumes.
2.  No active cardiopulmonary disease.

## 2013-01-03 ENCOUNTER — Telehealth: Payer: Self-pay

## 2013-01-03 NOTE — Telephone Encounter (Signed)
Pt daughter called and wants to ask a question about OTC headache medication.

## 2013-01-03 NOTE — Telephone Encounter (Signed)
Returned call to pt's daughter.  Inquiring about what OTC headache medication pt can safely take on Coumadin.  Advised Tylenol and acetaminophen based products are safe to take with Coumadin.  Advised to avoid ASA and IBU products as these can increase risk of bleeding. Do not exceed dosage recommendations on the bottle. Pt's daughter verbalized understanding.

## 2013-01-10 ENCOUNTER — Ambulatory Visit (INDEPENDENT_AMBULATORY_CARE_PROVIDER_SITE_OTHER): Payer: Medicare Other

## 2013-01-10 DIAGNOSIS — I4891 Unspecified atrial fibrillation: Secondary | ICD-10-CM

## 2013-01-10 DIAGNOSIS — Z7901 Long term (current) use of anticoagulants: Secondary | ICD-10-CM

## 2013-01-10 LAB — POCT INR: INR: 2

## 2013-01-14 IMAGING — CR DG CHEST 1V PORT
1 series · 1 of 1 positions shown · non-contrast
Comparison: none

REASON FOR EXAM: Chest Pain
COMMENTS:

[view not recorded]
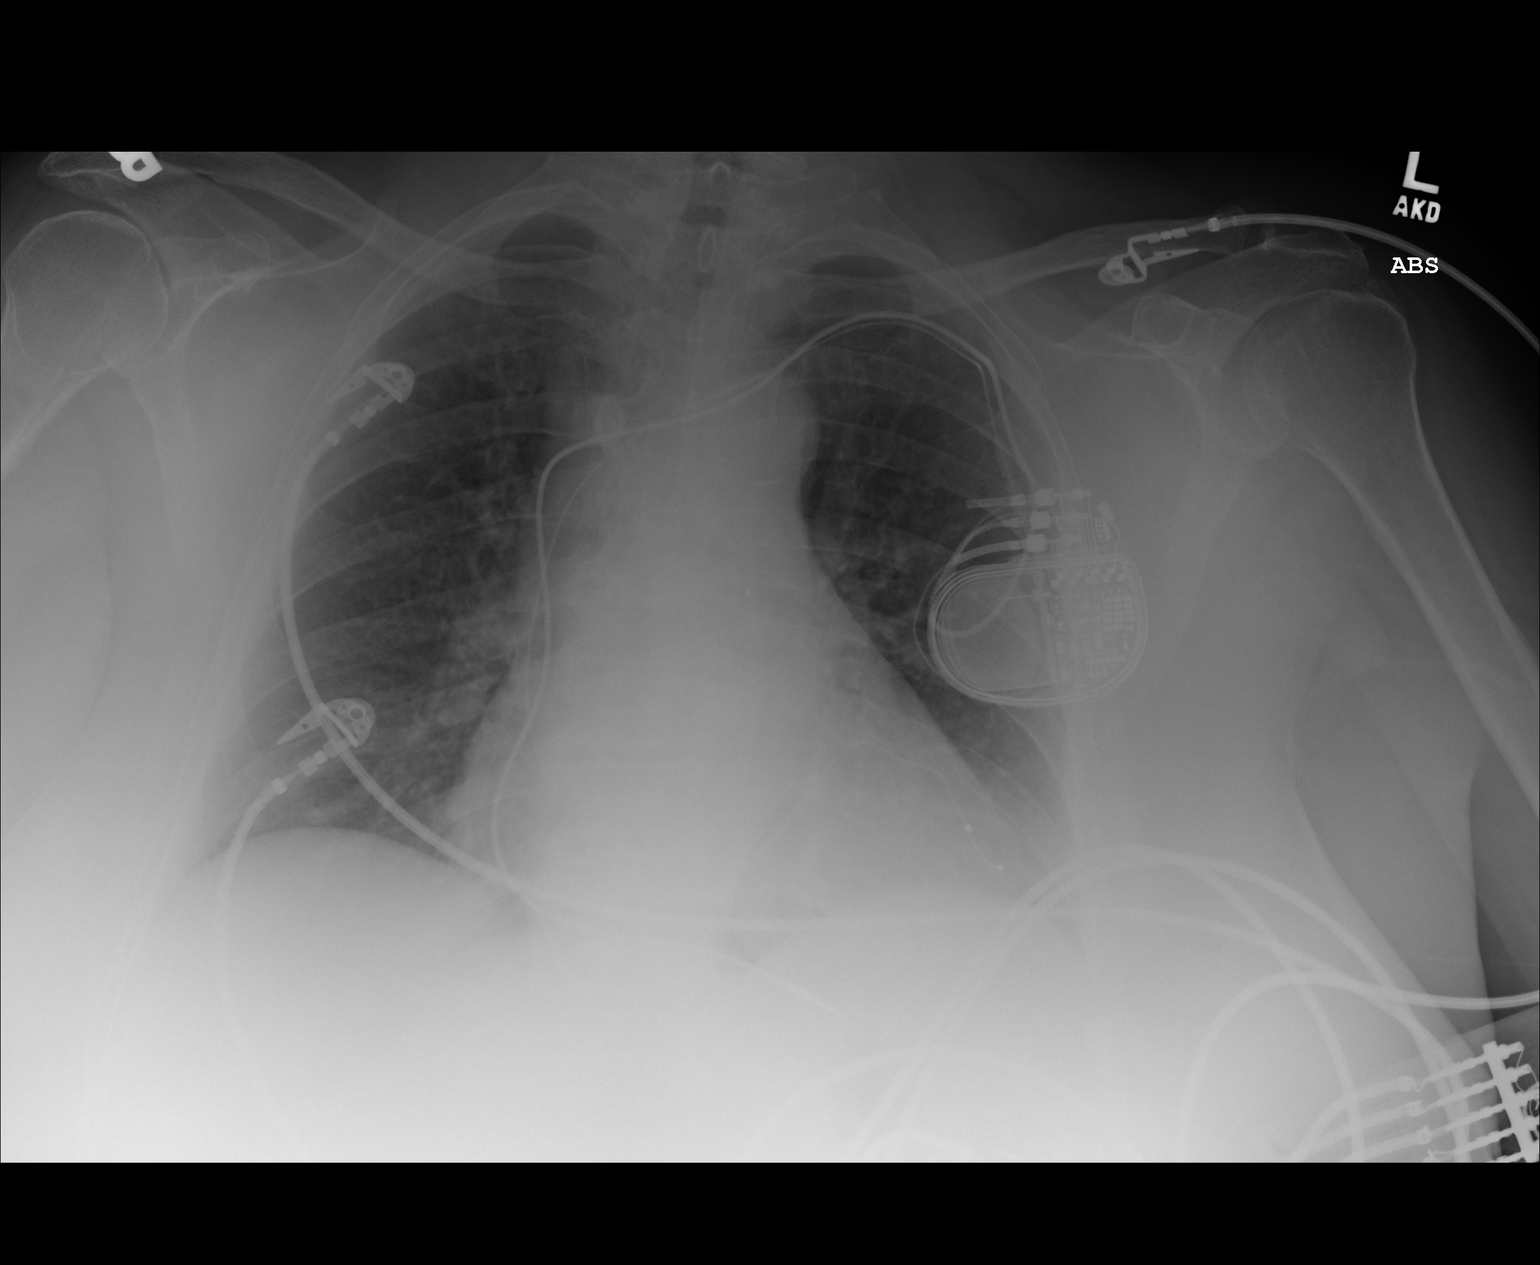

[1 of 1 positions shown; findings below may reference images not displayed]

PROCEDURE:     DXR - DXR PORTABLE CHEST SINGLE VIEW  - June 29, 2010  [DATE]

RESULT:     Cardiomegaly is present. Mild blunting of the left costophrenic
angle may be present. Small pleural effusion cannot be excluded. There is
mild pulmonary vascular prominence. Mild congestive heart failure should be
considered. Cardiac pacer is  noted with lead tips in good position. The
chest is stable from prior study.
IMPRESSION: Findings suggesting mild congestive heart failure.

## 2013-02-01 ENCOUNTER — Telehealth: Payer: Self-pay | Admitting: *Deleted

## 2013-02-01 NOTE — Telephone Encounter (Signed)
I spoke with Morrie Sheldon. I notified her that I do not see IV contrast as an allergy for the patient. I explained if she does have an allergy and even if we did treat her for that, we typically would give prednisone 60 mg the night before and day of with benadryl 25 mg with each prednisone dose. We can also give pepcid along with that. Morrie Sheldon states that the patient said she stops breathing with contrast. I will add this to her allergy list.

## 2013-02-01 NOTE — Telephone Encounter (Signed)
[name] from Dr. Geralyn Flash office called and needs to know pt is allergic to iv contrast, wants to know how Dr. Graciela Husbands put in her pacemaker she needs to have a cta of her neck. Please advise

## 2013-02-16 ENCOUNTER — Emergency Department: Payer: Self-pay | Admitting: Emergency Medicine

## 2013-02-16 LAB — COMPREHENSIVE METABOLIC PANEL
Albumin: 3.3 g/dL — ABNORMAL LOW (ref 3.4–5.0)
Anion Gap: 7 (ref 7–16)
Bilirubin,Total: 0.5 mg/dL (ref 0.2–1.0)
Chloride: 109 mmol/L — ABNORMAL HIGH (ref 98–107)
Co2: 22 mmol/L (ref 21–32)
Creatinine: 0.78 mg/dL (ref 0.60–1.30)
EGFR (African American): >60
EGFR (Non-African Amer.): >60
Osmolality: 277 (ref 275–301)
SGOT(AST): 18 U/L (ref 15–37)
Sodium: 138 mmol/L (ref 136–145)

## 2013-02-16 LAB — CBC
HCT: 37.6 % (ref 35.0–47.0)
MCH: 31.1 pg (ref 26.0–34.0)
MCHC: 34.1 g/dL (ref 32.0–36.0)
MCV: 91 fL (ref 80–100)
RBC: 4.12 10*6/uL (ref 3.80–5.20)
RDW: 12.6 % (ref 11.5–14.5)
WBC: 11 10*3/uL (ref 3.6–11.0)

## 2013-02-16 LAB — TROPONIN I: Troponin-I: <0.02 ng/mL

## 2013-02-19 ENCOUNTER — Ambulatory Visit (INDEPENDENT_AMBULATORY_CARE_PROVIDER_SITE_OTHER): Payer: Medicare Other | Admitting: Physician Assistant

## 2013-02-19 ENCOUNTER — Encounter: Payer: Self-pay | Admitting: Physician Assistant

## 2013-02-19 VITALS — BP 138/80 | HR 77 | Ht 63.0 in | Wt 189.8 lb

## 2013-02-19 DIAGNOSIS — I4891 Unspecified atrial fibrillation: Secondary | ICD-10-CM

## 2013-02-19 DIAGNOSIS — I5032 Chronic diastolic (congestive) heart failure: Secondary | ICD-10-CM

## 2013-02-19 DIAGNOSIS — R079 Chest pain, unspecified: Secondary | ICD-10-CM

## 2013-02-19 DIAGNOSIS — I509 Heart failure, unspecified: Secondary | ICD-10-CM

## 2013-02-19 DIAGNOSIS — R0602 Shortness of breath: Secondary | ICD-10-CM

## 2013-02-19 DIAGNOSIS — Z950 Presence of cardiac pacemaker: Secondary | ICD-10-CM

## 2013-02-19 DIAGNOSIS — I10 Essential (primary) hypertension: Secondary | ICD-10-CM

## 2013-02-19 DIAGNOSIS — R791 Abnormal coagulation profile: Secondary | ICD-10-CM

## 2013-02-19 DIAGNOSIS — G4733 Obstructive sleep apnea (adult) (pediatric): Secondary | ICD-10-CM | POA: Insufficient documentation

## 2013-02-19 MED ORDER — AMLODIPINE BESYLATE 5 MG PO TABS: 5.0000 mg | ORAL_TABLET | Freq: Every day | ORAL | Status: DC

## 2013-02-19 MED ORDER — POTASSIUM CHLORIDE ER 10 MEQ PO CPCR: 10.0000 meq | ORAL_CAPSULE | Freq: Two times a day (BID) | ORAL | Status: DC

## 2013-02-19 MED ORDER — FUROSEMIDE 20 MG PO TABS: 20.0000 mg | ORAL_TABLET | Freq: Every day | ORAL | Status: DC

## 2013-02-19 NOTE — Assessment & Plan Note (Addendum)
INR > 13 in the ED. No abnormal bleeding. She received vitamin K 3mg  x 1. She has held Coumadin. Resume tomorrow at previous dosing (6mg ). Follow-up Coumadin clinic 02/21/13 as previously scheduled.

## 2013-02-19 NOTE — Assessment & Plan Note (Signed)
The patient had a recent CHF exacerbation likely provoked by uncontrolled HTN. She denies chest pain. Initial TnI in the ED was WNL. Stress test earlier this year indicated no ischemia. BP in the ED was 190/80s. Unclear why she did not take Norvasc after this was recommended in March. Will start Norvasc 5mg . BP well-controlled in the office today. She needs to wear her CPAP for OSA. Advised to f/u with her PCP to arrange this. Educated on salt restriction. Continue Lasix 20mg  and KCl 10 mEq PO daily.

## 2013-02-19 NOTE — Patient Instructions (Addendum)
We will plan to start a new blood pressure medicine- Norvasc 5mg  by mouth daily.   Please follow-up with your primary care provider to obtain a sleep study and thyroid blood work. Uncontrolled sleep apnea can cause high blood pressure which induced heart failure recently detected in the emergency department. Using a CPAP machine controls sleep apnea appropriately and reduces blood pressure.   Please resume Coumadin tomorrow evening (6mg ), and follow-up on Wednesday at the Coumadin and for a pacemaker check.   Please continue Lasix and potassium daily as prescribed.  We will see you back in 3 months.

## 2013-02-19 NOTE — Assessment & Plan Note (Signed)
Follow-up 9/17 as previously scheduled.

## 2013-02-19 NOTE — Assessment & Plan Note (Signed)
Resume Coumadin as outlined. Continue Lopressor.

## 2013-02-19 NOTE — Progress Notes (Addendum)
Patient ID: Jacqueline Lozano, female   DOB: 12-24-1934, 78 y.o.   MRN: [id number]            Date:  02/19/2013   ID:  Jacqueline Lozano, DOB 1934-09-18, MRN [id number]  PCP:  Jerl Mina, MD  Primary Cardiologist:  Concha Se, MD Primary Electrophysiologist:  Odessa Fleming, MD   History of Present Illness:  Jacqueline Lozano is a 78 y.o. female w/ PMHx s/f permanent atrial fibrillation (s/p AVN ablation and CRT-P), chronic Coumadin anticoagulation, chronic diastolic CHF, PAD, HTN, HLD, obesity, OSA (non-compliant with CPAP), hypothyroidism and GERD who presents for follow-up after an [facility] ED visit 9/12 for acute on chronic diastolic CHF.   Lexiscan Myoview 07/18/12: EF >65%, no evidence of ischemia or WMAs   2D echo 08/18/2012: EF 55-60%, mild LVH, mild MR, mild-mod biatrial enlargement, PASP 34 mmHg   She has had prior work-up for dyspnea/hypoxia on exertion. She followed up with Dr. Kendrick Fries in 2012. She endorsed non-compliance with CPAP. Obesity and deconditioning were felt to be the main contributiong factors as no primary pulmonary pathology was identified. Weight loss and cardiac rehab have been stressed and arranged, respectively.   She presented to the ED 02/16/13 after c/o chest/bilateral arm pressure, shortness of breath and palpitatoins. BP was markedly elevated on arrival at 186-196/93. BNP was mildly elevated at 1549. CXR indicated mild CHF type changes. EKG revealed A-pacing, Bi-V pacing. TnI WNL x 1. CMET and CBC were largely WNL aside from a mild hypoalbuminemia (3.3). PT/INR, however, revealed a Coumadin-induced coagulopathy- INR 13.3. She was given vitamin K 3mg . No repeat INR on EMR. Lasix 40mg  PO x 1 was administered as well. No I/O or serial weights. She was discharged on Lasix 20, KCl 10 daily, advised to hold Coumadin and follows up today.   She does not recall taking amlodipine previously. She denies active bleeding. She has not taken Coumadin since her ED visit. No further elevated BP  at home. She reports a recent diagnosis of thyroid nodules. She is uncertain if TFTs have been checked, or if further work-up is underway. She is non-compliant with CPAP. She denies chest pain, SOB/DOE, PND, orthopnea, LE edema or syncope. She notes occasional palpitatoins.  EKG: A-paced, Bi-V paced, 77 bpm, LAD  Wt Readings from Last 3 Encounters:  02/19/13 189 lb 12 oz (86.07 kg)  12/18/12 200 lb 8 oz (90.946 kg)  09/28/12 198 lb (89.812 kg)     Past Medical History  Diagnosis Date  . Hypertension   . Hypothyroidism   . GERD (gastroesophageal reflux disease)   . Migraine   . OSA (obstructive sleep apnea)     Not consistently using CPAP  . Allergic to IV contrast   . PAD (peripheral artery disease)     Occlusive disease involving left PT and AT  . Hyperlipidemia     Has been unable to take statins due to muscle pain. Has tried multiple statins per her report.  . Abnormal echocardiogram 09/2009    EF >55%, mild LVH, moderate LAE, normal RV, normal PA systolic pressure  . Paroxysmal atrial fibrillation     Breakthrough a fib on sotalol, changed to amiodarone 4/11. DCCV 4/11  . Congestive heart failure, unspecified     Current Outpatient Prescriptions  Medication Sig Dispense Refill  . Alum & Mag Hydroxide-Simeth (DI-GEL PO) Take by mouth as needed.      . budesonide-formoterol (SYMBICORT) 160-4.5 MCG/ACT inhaler Inhale 2 puffs into the lungs 2 (two) times daily as needed.      Marland Kitchen  COUMADIN 4 MG tablet Take as directed by anticoagulation clinic  60 tablet  3  . COUMADIN 4 MG tablet TAKE AS DIRECTED BY ANTICOAGULATION CLINIC  60 tablet  3  . diphenoxylate-atropine (LOMOTIL) 2.5-0.025 MG per tablet Take 1 tablet by mouth 4 (four) times daily as needed.        . famotidine (PEPCID) 20 MG tablet Take 20 mg by mouth 2 (two) times daily.      . furosemide (LASIX) 20 MG tablet Take 20 mg by mouth daily.      Marland Kitchen levothyroxine (SYNTHROID, LEVOTHROID) 112 MCG tablet Take 112 mcg by mouth  daily before breakfast.      . metoprolol (LOPRESSOR) 50 MG tablet Take 75 mg by mouth 2 (two) times daily.      . nitroGLYCERIN (NITROSTAT) 0.4 MG SL tablet Place 1 tablet (0.4 mg total) under the tongue every 5 (five) minutes as needed for chest pain.  25 tablet  3  . potassium chloride (K-DUR) 10 MEQ tablet Take 10 mEq by mouth daily.      . prochlorperazine (COMPAZINE) 5 MG tablet Take 1 tablet (5 mg total) by mouth every 6 (six) hours as needed.  90 tablet  5  . VITAMIN D, CHOLECALCIFEROL, PO Take 5,000 Units by mouth 3 (three) times a week.       No current facility-administered medications for this visit.    Allergies:    Allergies  Allergen Reactions  . Ivp Dye [Iodinated Diagnostic Agents] Shortness Of Breath  . Codeine   . Iodine   . Morphine   . Oxycodone-Acetaminophen   . Persantine [Dipyridamole]   . Statins   . Sulfonamide Derivatives     Social History:  The patient  reports that she has never smoked. She has never used smokeless tobacco. She reports that she does not drink alcohol or use illicit drugs.   Family History:  Family History  Problem Relation Age of Onset  . Heart attack Mother     MI x2  . Heart attack Father     MI  . Colon cancer Daughter     Review of Systems: General: negative for chills, fever, night sweats or weight changes.  Cardiovascular: positive for occasional palpitations, negative for chest pain, dyspnea on exertion, edema, orthopnea, paroxysmal nocturnal dyspnea or shortness of breath Dermatological: negative for rash Respiratory: negative for cough or wheezing Urologic: negative for hematuria Abdominal: negative for nausea, vomiting, diarrhea, bright red blood per rectum, melena, or hematemesis Neurologic: negative for visual changes, syncope, or dizziness All other systems reviewed and are otherwise negative except as noted above.  PHYSICAL EXAM: VS:  BP 138/80  Pulse 77  Ht 5\' 3"  (1.6 m)  Wt 189 lb 12 oz (86.07 kg)  BMI  33.62 kg/m2 Well nourished, obese appearing female, in no acute distress HEENT: normal, PERRL Neck: no JVD or bruits Cardiac:  normal S1, S2; RRR; no murmur or gallops Lungs:  clear to auscultation bilaterally, no wheezing, rhonchi or rales Abd: soft, nontender, no hepatomegaly, normoactive BS x 4 quads Ext: no edema, cyanosis or clubbing Skin: warm and dry, cap refill < 2 sec Neuro:  CNs 2-12 intact, no focal abnormalities noted Musculoskeletal: strength and tone appropriate for age  Psych: normal affect

## 2013-02-19 NOTE — Assessment & Plan Note (Addendum)
Add Norvasc 5mg  daily. Well-controlled in the office today given marked HTN in the ED with resultant CHF decompensation.

## 2013-02-19 NOTE — Assessment & Plan Note (Signed)
Noncompliant with CPAP. Follow-up PCP for sleep study +/- CPAP calibration.

## 2013-02-19 NOTE — Addendum Note (Signed)
Addended by: Odella Aquas A on: 02/19/2013 02:05 PM   Modules accepted: Level of Service

## 2013-02-21 ENCOUNTER — Encounter: Payer: Self-pay | Admitting: Internal Medicine

## 2013-02-21 ENCOUNTER — Telehealth: Payer: Self-pay

## 2013-02-21 ENCOUNTER — Ambulatory Visit (INDEPENDENT_AMBULATORY_CARE_PROVIDER_SITE_OTHER): Payer: Medicare Other | Admitting: Internal Medicine

## 2013-02-21 ENCOUNTER — Ambulatory Visit (INDEPENDENT_AMBULATORY_CARE_PROVIDER_SITE_OTHER): Payer: Medicare Other

## 2013-02-21 VITALS — BP 151/81 | HR 78 | Ht 63.0 in | Wt 189.2 lb

## 2013-02-21 DIAGNOSIS — I509 Heart failure, unspecified: Secondary | ICD-10-CM

## 2013-02-21 DIAGNOSIS — Z950 Presence of cardiac pacemaker: Secondary | ICD-10-CM

## 2013-02-21 DIAGNOSIS — I5032 Chronic diastolic (congestive) heart failure: Secondary | ICD-10-CM

## 2013-02-21 DIAGNOSIS — I4891 Unspecified atrial fibrillation: Secondary | ICD-10-CM

## 2013-02-21 DIAGNOSIS — Z7901 Long term (current) use of anticoagulants: Secondary | ICD-10-CM

## 2013-02-21 LAB — POCT INR: INR: 1.5

## 2013-02-21 LAB — PACEMAKER DEVICE OBSERVATION

## 2013-02-21 NOTE — Progress Notes (Signed)
Patient Care Team: Jerl Mina, MD as PCP - General (Family Medicine)   HPI  Jacqueline Lozano is a 78 y.o. female  Comes in today not with any specific cardiac complaints but because of neurologist and told her she might need a stent she had thyroid surgery.  Apparently she has thyroid nodules. Biopsies are planned Cardiac symptoms are stable    Past Medical History  Diagnosis Date  . Hypertension   . Hypothyroidism   . GERD (gastroesophageal reflux disease)   . Migraine   . OSA (obstructive sleep apnea)     Not consistently using CPAP  . Allergic to IV contrast   . PAD (peripheral artery disease)     Occlusive disease involving left PT and AT  . Hyperlipidemia     Has been unable to take statins due to muscle pain. Has tried multiple statins per her report.  . Abnormal echocardiogram 09/2009    EF >55%, mild LVH, moderate LAE, normal RV, normal PA systolic pressure  . Paroxysmal atrial fibrillation     Breakthrough a fib on sotalol, changed to amiodarone 4/11. DCCV 4/11  . Chronic diastolic CHF (congestive heart failure)     Past Surgical History  Procedure Laterality Date  . Cardiac catheterization      Left heart cath x3 in teh past, last 5 yrs ago. All "normal" per pt report  . Pacemaker insertion  06/12/2010  . Atrial ablation surgery  06/12/2010  . Appendectomy    . Cholecystectomy open    . Vesicovaginal fistula closure w/ tah    . Carpal tunnel release    . Cardioversion      Current Outpatient Prescriptions  Medication Sig Dispense Refill  . Alum & Mag Hydroxide-Simeth (DI-GEL PO) Take by mouth as needed.      Marland Kitchen amLODipine (NORVASC) 5 MG tablet Take 1 tablet (5 mg total) by mouth daily.  180 tablet  3  . budesonide-formoterol (SYMBICORT) 160-4.5 MCG/ACT inhaler Inhale 2 puffs into the lungs 2 (two) times daily as needed.      Marland Kitchen COUMADIN 4 MG tablet Take as directed by anticoagulation clinic  60 tablet  3  . COUMADIN 4 MG tablet TAKE AS DIRECTED BY  ANTICOAGULATION CLINIC  60 tablet  3  . diphenoxylate-atropine (LOMOTIL) 2.5-0.025 MG per tablet Take 1 tablet by mouth 4 (four) times daily as needed.        . famotidine (PEPCID) 20 MG tablet Take 20 mg by mouth 2 (two) times daily.      . furosemide (LASIX) 20 MG tablet Take 1 tablet (20 mg total) by mouth daily.  30 tablet  3  . levothyroxine (SYNTHROID, LEVOTHROID) 112 MCG tablet Take 112 mcg by mouth daily before breakfast.      . metoprolol (LOPRESSOR) 50 MG tablet Take 75 mg by mouth 2 (two) times daily.      . nitroGLYCERIN (NITROSTAT) 0.4 MG SL tablet Place 1 tablet (0.4 mg total) under the tongue every 5 (five) minutes as needed for chest pain.  25 tablet  3  . potassium chloride (MICRO-K) 10 MEQ CR capsule Take 1 capsule (10 mEq total) by mouth 2 (two) times daily.  30 capsule  3  . prochlorperazine (COMPAZINE) 5 MG tablet Take 1 tablet (5 mg total) by mouth every 6 (six) hours as needed.  90 tablet  5  . VITAMIN D, CHOLECALCIFEROL, PO Take 5,000 Units by mouth 3 (three) times a week.       No  current facility-administered medications for this visit.    Allergies  Allergen Reactions  . Ivp Dye [Iodinated Diagnostic Agents] Shortness Of Breath  . Codeine   . Iodine   . Morphine   . Oxycodone-Acetaminophen   . Persantine [Dipyridamole]   . Statins   . Sulfonamide Derivatives     Review of Systems negative except from HPI and PMH  Physical Exam BP 151/81  Pulse 78  Ht 5\' 3"  (1.6 m)  Wt 189 lb 4 oz (85.843 kg)  BMI 33.53 kg/m2 Well developed and nourished in no acute distress HENT normal Neck supple with JVP-flat Clear Regular rate and rhythm, no murmurs or gallops Abd-soft with active BS No Clubbing cyanosis edema Skin-warm and dry A & Oriented  Grossly normal sensory and motor function     Assessment and  Plan

## 2013-02-21 NOTE — Patient Instructions (Addendum)
Your physician recommends that you continue on your current medications as directed. Please refer to the Current Medication list given to you today.     

## 2013-02-21 NOTE — Assessment & Plan Note (Signed)
The patient's device was interrogated.  The information was reviewed. No changes were made in the programming.    

## 2013-02-21 NOTE — Assessment & Plan Note (Signed)
euvolemic continue current meds  Reviewed with her the importance of following up reagarding the thyroid issue

## 2013-02-21 NOTE — Telephone Encounter (Signed)
Pt was seen today, and wants to know if DR Graciela Husbands wants her to stay on her Lasix and Potassium. Please advise.

## 2013-02-22 MED ORDER — FUROSEMIDE 20 MG PO TABS: 20.0000 mg | ORAL_TABLET | Freq: Every day | ORAL | Status: DC

## 2013-02-22 MED ORDER — POTASSIUM CHLORIDE ER 10 MEQ PO CPCR: ORAL_CAPSULE | ORAL | Status: DC

## 2013-02-22 NOTE — Telephone Encounter (Signed)
I called the patient's PCP to get a correct home # - [phone]. Sherri Rad, RN, BSN  Spoke with the patient and made her aware that Dr. Graciela Husbands stated to continue her current medications. She was concerned now that her refill is about to run out. I advised we can refill for her. She was concerned because her urine is darker in nature since starting this. No BMP drawn since starting this medication. The patient states Alinda Money, [place] told her that Dr. Graciela Husbands could manage her fluid status at her visit yesterday. The patient does not weigh daily. I explained to her how to do this. I advised I will [facility] I will also send to Dr. Graciela Husbands to review and see if he thinks a patient needs a repeat BMP due to dark urine. Will call her back with his recommendations. She is agreeable.

## 2013-02-23 ENCOUNTER — Encounter: Payer: Medicare Other | Admitting: Physician Assistant

## 2013-02-23 ENCOUNTER — Telehealth: Payer: Self-pay | Admitting: *Deleted

## 2013-02-23 ENCOUNTER — Other Ambulatory Visit: Payer: Medicare Other

## 2013-02-23 DIAGNOSIS — R82998 Other abnormal findings in urine: Secondary | ICD-10-CM

## 2013-02-23 DIAGNOSIS — R42 Dizziness and giddiness: Secondary | ICD-10-CM

## 2013-02-23 NOTE — Telephone Encounter (Signed)
Spoke w/ pt.  She had meds refilled yesterday, States that she feels worse, not better. Blurred vision is worse. Joints feel like "someone beat her up during the night". Trouble urinating "all at once, or just a trickle".  Was given K+ & Lasix in the hospital. She would like some clarification on her meds. Pt sched to see Alinda Money, [place] in the office this afternoon at 2:00.

## 2013-02-23 NOTE — Telephone Encounter (Signed)
Patient would like for RN to call her back, she has some medical questions she would like to discuss.

## 2013-02-23 NOTE — Telephone Encounter (Signed)
Pt came into office today to have blood drawn for BMP. Presented her old K+ pill (a round yellow tablet) compared to her new K+ pill (large dark blue capsule). Her pharmacy states that the difference in appearance is due to the difference in manufacturers, but she wants to ensure that she is taking the right thing. Pt states that since starting Lasix with K+ 9/12 in [facility] ED, her muscles hurt like "someone beat her up at night", blurred vision, lightheadedness, headache, and dizziness. Pt states that symptoms seem to be worsening over the past week. She did not take lasix or K+ last night.   She would like to d/c lasix, but is afraid to do so without instructions on tapering off of it, as she "read a book that told her not to stop it". She would appreciate a call from our office today, as she is unclear as to what to do over the weekend.  Pt states that her PCP, Dr. Burnett Sheng is out of the office on Fridays.   She will call him on Monday to see about getting an appt with him to have a u/a, as pt c/o dark urine despite drinking "at least 7 bottles of water a day".

## 2013-02-23 NOTE — Telephone Encounter (Signed)
Spoke w/ pt.  She will call Dr. Webb Silversmith office for an appt to possibly perform a u/a and address her medication concerns.  She will come to our office this afternoon to have BMP drawn.

## 2013-02-24 LAB — BASIC METABOLIC PANEL
BUN/Creatinine Ratio: 20 (ref 11–26)
CO2: 21 mmol/L (ref 18–29)
Creatinine, Ser: 0.87 mg/dL (ref 0.57–1.00)
GFR calc Af Amer: 75 mL/min/{1.73_m2} (ref >59)
Sodium: 137 mmol/L (ref 134–144)

## 2013-02-26 NOTE — Telephone Encounter (Signed)
Spoke w/ pt. She understands instructions and will call us later this week to let us know how she is doing.

## 2013-02-26 NOTE — Telephone Encounter (Signed)
She could certainly hold the Lasix and potassium for several days to see if symptoms get better Would significantly decrease her fluid intake otherwise she will end up in the hospital with heart failure  Would only drink when she is thirsty, not excessive 7 glasses of water a day may be too much Would use candy and gum to make her mouth feel better  Would let us know if she feels better by holding the diuretic and potassium for several days We'll also track the basic metabolic panel done today

## 2013-02-26 NOTE — Progress Notes (Signed)
This encounter was created in error - please disregard.

## 2013-02-26 NOTE — Telephone Encounter (Signed)
i would continue current meds as at the office as things looked pretty good But good idea to get bmet Thanks steve

## 2013-02-27 NOTE — Telephone Encounter (Signed)
I called and spoke with the patient. She states Jacqueline Lozano had spoken with her yesterday with instructions from Dr. Mariah Milling. BMP was done 9/19 and was normal. Patient will hold lasix x several days per Dr. Windell Hummingbird instructions. She has bought a scale and has been instructed on how to do daily weights and when to call.

## 2013-02-28 ENCOUNTER — Ambulatory Visit (INDEPENDENT_AMBULATORY_CARE_PROVIDER_SITE_OTHER): Payer: Medicare Other

## 2013-02-28 ENCOUNTER — Other Ambulatory Visit: Payer: Medicare Other

## 2013-02-28 ENCOUNTER — Ambulatory Visit: Payer: Self-pay | Admitting: Cardiovascular Disease

## 2013-02-28 DIAGNOSIS — Z7901 Long term (current) use of anticoagulants: Secondary | ICD-10-CM

## 2013-02-28 DIAGNOSIS — I4891 Unspecified atrial fibrillation: Secondary | ICD-10-CM

## 2013-03-07 ENCOUNTER — Ambulatory Visit (INDEPENDENT_AMBULATORY_CARE_PROVIDER_SITE_OTHER): Payer: Medicare Other | Admitting: General Practice

## 2013-03-07 DIAGNOSIS — I4891 Unspecified atrial fibrillation: Secondary | ICD-10-CM

## 2013-03-07 DIAGNOSIS — Z7901 Long term (current) use of anticoagulants: Secondary | ICD-10-CM

## 2013-03-07 LAB — POCT INR: INR: 4.2

## 2013-03-14 ENCOUNTER — Other Ambulatory Visit: Payer: Self-pay | Admitting: Cardiovascular Disease

## 2013-03-14 ENCOUNTER — Ambulatory Visit (INDEPENDENT_AMBULATORY_CARE_PROVIDER_SITE_OTHER): Payer: Medicare Other | Admitting: General Practice

## 2013-03-14 DIAGNOSIS — Z7901 Long term (current) use of anticoagulants: Secondary | ICD-10-CM

## 2013-03-14 DIAGNOSIS — I4891 Unspecified atrial fibrillation: Secondary | ICD-10-CM

## 2013-03-14 LAB — PROTIME-INR: INR: 6.6 — AB (ref <=1.1)

## 2013-03-14 LAB — POCT INR: INR: 6.6

## 2013-03-16 ENCOUNTER — Other Ambulatory Visit (INDEPENDENT_AMBULATORY_CARE_PROVIDER_SITE_OTHER): Payer: Medicare Other

## 2013-03-16 DIAGNOSIS — I4891 Unspecified atrial fibrillation: Secondary | ICD-10-CM

## 2013-03-16 DIAGNOSIS — Z7901 Long term (current) use of anticoagulants: Secondary | ICD-10-CM

## 2013-03-21 ENCOUNTER — Ambulatory Visit (INDEPENDENT_AMBULATORY_CARE_PROVIDER_SITE_OTHER): Payer: Medicare Other | Admitting: General Practice

## 2013-03-21 DIAGNOSIS — I4891 Unspecified atrial fibrillation: Secondary | ICD-10-CM

## 2013-03-21 DIAGNOSIS — Z7901 Long term (current) use of anticoagulants: Secondary | ICD-10-CM

## 2013-03-28 ENCOUNTER — Ambulatory Visit (INDEPENDENT_AMBULATORY_CARE_PROVIDER_SITE_OTHER): Payer: Medicare Other | Admitting: General Practice

## 2013-03-28 DIAGNOSIS — I4891 Unspecified atrial fibrillation: Secondary | ICD-10-CM

## 2013-03-28 DIAGNOSIS — Z7901 Long term (current) use of anticoagulants: Secondary | ICD-10-CM

## 2013-03-28 LAB — POCT INR: INR: 3.1

## 2013-04-03 ENCOUNTER — Emergency Department: Payer: Self-pay | Admitting: Emergency Medicine

## 2013-04-03 ENCOUNTER — Other Ambulatory Visit: Payer: Self-pay

## 2013-04-03 ENCOUNTER — Ambulatory Visit (INDEPENDENT_AMBULATORY_CARE_PROVIDER_SITE_OTHER): Payer: Medicare Other | Admitting: *Deleted

## 2013-04-03 DIAGNOSIS — I4891 Unspecified atrial fibrillation: Secondary | ICD-10-CM

## 2013-04-03 DIAGNOSIS — I5032 Chronic diastolic (congestive) heart failure: Secondary | ICD-10-CM

## 2013-04-03 DIAGNOSIS — I509 Heart failure, unspecified: Secondary | ICD-10-CM

## 2013-04-03 LAB — TROPONIN I: Troponin-I: <0.02 ng/mL

## 2013-04-03 LAB — CBC WITH DIFFERENTIAL/PLATELET
Basophil %: 0.8 %
Eosinophil #: 0.5 10*3/uL (ref 0.0–0.7)
HCT: 37 % (ref 35.0–47.0)
HGB: 12.6 g/dL (ref 12.0–16.0)
MCH: 30.9 pg (ref 26.0–34.0)
Monocyte #: 1.4 x10 3/mm — ABNORMAL HIGH (ref 0.2–0.9)
Monocyte %: 10 %
Neutrophil #: 6.2 10*3/uL (ref 1.4–6.5)
RBC: 4.07 10*6/uL (ref 3.80–5.20)

## 2013-04-03 LAB — PACEMAKER DEVICE OBSERVATION
AL IMPEDENCE PM: 4047 Ohm
ATRIAL PACING PM: 0
BATTERY VOLTAGE: 2.9731 V
LV LEAD THRESHOLD: 1.375 V
RV LEAD AMPLITUDE: 5.25 mv
RV LEAD IMPEDENCE PM: 513 Ohm
VENTRICULAR PACING PM: 100

## 2013-04-03 LAB — COMPREHENSIVE METABOLIC PANEL
Albumin: 3.7 g/dL (ref 3.4–5.0)
Anion Gap: 7 (ref 7–16)
Bilirubin,Total: 0.6 mg/dL (ref 0.2–1.0)
Co2: 26 mmol/L (ref 21–32)
Creatinine: 1 mg/dL (ref 0.60–1.30)
EGFR (African American): >60
EGFR (Non-African Amer.): 55 — ABNORMAL LOW
Glucose: 94 mg/dL (ref 65–99)
Osmolality: 281 (ref 275–301)
Potassium: 3.6 mmol/L (ref 3.5–5.1)
SGOT(AST): 23 U/L (ref 15–37)
Sodium: 141 mmol/L (ref 136–145)
Total Protein: 7.5 g/dL (ref 6.4–8.2)

## 2013-04-03 LAB — PROTIME-INR: Prothrombin Time: 28 secs — ABNORMAL HIGH (ref 11.5–14.7)

## 2013-04-03 MED ORDER — FUROSEMIDE 20 MG PO TABS: 20.0000 mg | ORAL_TABLET | Freq: Two times a day (BID) | ORAL | Status: DC

## 2013-04-03 MED ORDER — POTASSIUM CHLORIDE ER 10 MEQ PO TBCR: 10.0000 meq | EXTENDED_RELEASE_TABLET | Freq: Every day | ORAL | Status: DC

## 2013-04-03 NOTE — Progress Notes (Signed)
CRT-P device check in clinic. Normal device function. Thresholds, sensing, impedance consistent with previous measurements. Histograms appropriate for patient and level of activity. Patient bi-ventricularly pacing >100% of the time. Device programmed with appropriate safety margins. Device heart failure diagnostics are within normal limits and stable over time. Estimated longevity 2.5 years. Patient enrolled in remote follow-up/TTM's with [employer]. Plan to check device remotely in 3 months and every 6 months in office.

## 2013-04-04 ENCOUNTER — Ambulatory Visit (INDEPENDENT_AMBULATORY_CARE_PROVIDER_SITE_OTHER): Payer: Medicare Other | Admitting: General Practice

## 2013-04-04 DIAGNOSIS — Z7901 Long term (current) use of anticoagulants: Secondary | ICD-10-CM

## 2013-04-04 DIAGNOSIS — I4891 Unspecified atrial fibrillation: Secondary | ICD-10-CM

## 2013-04-18 ENCOUNTER — Ambulatory Visit (INDEPENDENT_AMBULATORY_CARE_PROVIDER_SITE_OTHER): Payer: Medicare Other | Admitting: *Deleted

## 2013-04-18 DIAGNOSIS — Z7901 Long term (current) use of anticoagulants: Secondary | ICD-10-CM

## 2013-04-18 DIAGNOSIS — I4891 Unspecified atrial fibrillation: Secondary | ICD-10-CM

## 2013-04-18 LAB — POCT INR: INR: 4.1

## 2013-05-02 ENCOUNTER — Ambulatory Visit (INDEPENDENT_AMBULATORY_CARE_PROVIDER_SITE_OTHER): Payer: Medicare Other | Admitting: General Practice

## 2013-05-02 DIAGNOSIS — I4891 Unspecified atrial fibrillation: Secondary | ICD-10-CM

## 2013-05-02 DIAGNOSIS — Z7901 Long term (current) use of anticoagulants: Secondary | ICD-10-CM

## 2013-05-02 LAB — POCT INR: INR: 2.7

## 2013-05-22 ENCOUNTER — Encounter: Payer: Self-pay | Admitting: Cardiovascular Disease

## 2013-05-22 ENCOUNTER — Ambulatory Visit (INDEPENDENT_AMBULATORY_CARE_PROVIDER_SITE_OTHER): Payer: Medicare Other | Admitting: Cardiovascular Disease

## 2013-05-22 ENCOUNTER — Ambulatory Visit (INDEPENDENT_AMBULATORY_CARE_PROVIDER_SITE_OTHER): Payer: Medicare Other

## 2013-05-22 VITALS — BP 138/78 | HR 78 | Ht 62.0 in | Wt 194.0 lb

## 2013-05-22 DIAGNOSIS — R0602 Shortness of breath: Secondary | ICD-10-CM

## 2013-05-22 DIAGNOSIS — Z7901 Long term (current) use of anticoagulants: Secondary | ICD-10-CM

## 2013-05-22 DIAGNOSIS — I10 Essential (primary) hypertension: Secondary | ICD-10-CM

## 2013-05-22 DIAGNOSIS — R079 Chest pain, unspecified: Secondary | ICD-10-CM

## 2013-05-22 DIAGNOSIS — E785 Hyperlipidemia, unspecified: Secondary | ICD-10-CM

## 2013-05-22 DIAGNOSIS — I509 Heart failure, unspecified: Secondary | ICD-10-CM

## 2013-05-22 DIAGNOSIS — I4891 Unspecified atrial fibrillation: Secondary | ICD-10-CM

## 2013-05-22 LAB — POCT INR: INR: 3.3

## 2013-05-22 MED ORDER — METOPROLOL TARTRATE 50 MG PO TABS: 75.0000 mg | ORAL_TABLET | Freq: Two times a day (BID) | ORAL | Status: DC

## 2013-05-22 MED ORDER — COUMADIN 4 MG PO TABS: ORAL_TABLET | ORAL | Status: DC

## 2013-05-22 MED ORDER — NITROGLYCERIN 0.4 MG SL SUBL: 0.4000 mg | SUBLINGUAL_TABLET | SUBLINGUAL | Status: DC | PRN

## 2013-05-22 MED ORDER — AMLODIPINE BESYLATE 5 MG PO TABS: 5.0000 mg | ORAL_TABLET | Freq: Every day | ORAL | Status: DC

## 2013-05-22 MED ORDER — POTASSIUM CHLORIDE ER 10 MEQ PO TBCR: 10.0000 meq | EXTENDED_RELEASE_TABLET | Freq: Every day | ORAL | Status: DC

## 2013-05-22 MED ORDER — FUROSEMIDE 20 MG PO TABS: 20.0000 mg | ORAL_TABLET | Freq: Two times a day (BID) | ORAL | Status: DC

## 2013-05-22 MED ORDER — PROCHLORPERAZINE MALEATE 5 MG PO TABS: 5.0000 mg | ORAL_TABLET | Freq: Four times a day (QID) | ORAL | Status: DC | PRN

## 2013-05-22 NOTE — Assessment & Plan Note (Signed)
No recent lipid panel available. We'll discuss this with her on her next visit. We will try to obtain most recent lipid panel from Dr. Burnett Sheng

## 2013-05-22 NOTE — Assessment & Plan Note (Signed)
Blood pressure is well controlled on today's visit. No changes made to the medications. 

## 2013-05-22 NOTE — Assessment & Plan Note (Signed)
Chronic shortness of breath likely from deconditioning, body habitus, underlying arrhythmia and paced rhythm.

## 2013-05-22 NOTE — Patient Instructions (Addendum)
You are doing well. No medication changes were made.  Ok to skip lasix pill here and there Try to keep your weight around the same range  Please call us if you have new issues that need to be addressed before your next appt.  Your physician wants you to follow-up in: 6 months.  You will receive a reminder letter in the mail two months in advance. If you don't receive a letter, please call our office to schedule the follow-up appointment.

## 2013-05-22 NOTE — Progress Notes (Signed)
Patient ID: Jacqueline Lozano, female    DOB: 1935-04-07, 78 y.o.   MRN: [id number]  HPI Comments: Mrs Beldin is a pleasant 48 -year-old woman with a history of atrial fibrillation, status post arteriovenous node ablation, Pacer/CRT-P, Hypertension, Paroxysmal nocturnal dyspnea, Hypothyroidism, Diastolic congestive heart failure, C. difficile infection, who presents for followup. Previous pacemaker interrogation shows that she is predominantly ventricularly paced.   In followup today, she reports doing relatively well. Blood pressure significantly improved from her prior clinic visit. She's taking amlodipine 5 mg, down from 10 mg secondary to side effects. Some atypical left-sided chest pain around her pacer pocket area. She reports having sleep study in October and again in November. Sleep study in October suggested she repeat the test as it was incomplete. The note indicates the study was discontinued as the patient had heart block, with limited EKG monitoring appearing to demonstrate a 43 second period of complete heart block with ventricular standstill. Atrial rate was 80 beats per minute. Note indicates the patient was awake and alert during the event with some dizziness. Pacer was checked after this event and no bands were noted or reported. Normal functioning device.  Followup sleep study 04/24/2013 recommended weight loss, though there was no indications for CPAP at this time.  Recent stress test showed no ischemia Echocardiogram showed normal LV function, right ventricular systolic pressure high normal.      Outpatient Encounter Prescriptions as of 05/22/2013  Medication Sig  . Alum & Mag Hydroxide-Simeth (DI-GEL PO) Take by mouth as needed.  Marland Kitchen amLODipine (NORVASC) 5 MG tablet Take 1 tablet (5 mg total) by mouth daily.  . budesonide-formoterol (SYMBICORT) 160-4.5 MCG/ACT inhaler Inhale 2 puffs into the lungs 2 (two) times daily as needed.  Marland Kitchen COUMADIN 4 MG tablet Take as directed by  anticoagulation clinic  . diphenoxylate-atropine (LOMOTIL) 2.5-0.025 MG per tablet Take 1 tablet by mouth 4 (four) times daily as needed.    . famotidine (PEPCID) 20 MG tablet Take 20 mg by mouth 2 (two) times daily.  . furosemide (LASIX) 20 MG tablet Take 1 tablet (20 mg total) by mouth 2 (two) times daily.  Marland Kitchen levothyroxine (SYNTHROID, LEVOTHROID) 112 MCG tablet Take 112 mcg by mouth daily before breakfast.  . metoprolol (LOPRESSOR) 50 MG tablet Take 75 mg by mouth 2 (two) times daily.  . nitroGLYCERIN (NITROSTAT) 0.4 MG SL tablet Place 1 tablet (0.4 mg total) under the tongue every 5 (five) minutes as needed for chest pain.  . potassium chloride (K-DUR) 10 MEQ tablet Take 1 tablet (10 mEq total) by mouth daily.  . prochlorperazine (COMPAZINE) 5 MG tablet Take 1 tablet (5 mg total) by mouth every 6 (six) hours as needed.  Marland Kitchen VITAMIN B1-B12 IM Inject into the muscle every 30 (thirty) days.  Marland Kitchen VITAMIN D, CHOLECALCIFEROL, PO Take 5,000 Units by mouth 3 (three) times a week.     Review of Systems  Constitutional: Negative.   HENT: Negative.   Eyes: Negative.   Respiratory: Positive for shortness of breath.   Gastrointestinal: Negative.   Musculoskeletal: Negative.   Skin: Negative.   Neurological: Negative.   Psychiatric/Behavioral: Negative.   All other systems reviewed and are negative.    BP 138/78  Pulse 78  Ht 5\' 2"  (1.575 m)  Wt 194 lb (87.998 kg)  BMI 35.47 kg/m2  Physical Exam  Nursing note and vitals reviewed. Constitutional: She is oriented to person, place, and time. She appears well-developed and well-nourished.  Obese  HENT:  Head:  Normocephalic.  Nose: Nose normal.  Mouth/Throat: Oropharynx is clear and moist.  Eyes: Conjunctivae are normal. Pupils are equal, round, and reactive to light.  Neck: Normal range of motion. Neck supple. No JVD present.  Cardiovascular: Normal rate, regular rhythm, S1 normal, S2 normal and intact distal pulses.  Exam reveals no gallop  and no friction rub.   Murmur heard.  Crescendo systolic murmur is present with a grade of 2/6  Pulmonary/Chest: Effort normal and breath sounds normal. No respiratory distress. She has no wheezes. She has no rales. She exhibits no tenderness.  Abdominal: Soft. Bowel sounds are normal. She exhibits no distension. There is no tenderness.  Musculoskeletal: Normal range of motion. She exhibits no edema and no tenderness.  Lymphadenopathy:    She has no cervical adenopathy.  Neurological: She is alert and oriented to person, place, and time. Coordination normal.  Skin: Skin is warm and dry. No rash noted. No erythema.  Psychiatric: She has a normal mood and affect. Her behavior is normal. Judgment and thought content normal.    Assessment and Plan

## 2013-06-06 ENCOUNTER — Ambulatory Visit (INDEPENDENT_AMBULATORY_CARE_PROVIDER_SITE_OTHER): Payer: Medicare Other

## 2013-06-06 DIAGNOSIS — Z7901 Long term (current) use of anticoagulants: Secondary | ICD-10-CM

## 2013-06-06 DIAGNOSIS — Z5181 Encounter for therapeutic drug level monitoring: Secondary | ICD-10-CM

## 2013-06-06 DIAGNOSIS — I4891 Unspecified atrial fibrillation: Secondary | ICD-10-CM

## 2013-06-22 ENCOUNTER — Ambulatory Visit (INDEPENDENT_AMBULATORY_CARE_PROVIDER_SITE_OTHER): Payer: Medicare Other | Admitting: *Deleted

## 2013-06-22 DIAGNOSIS — Z7901 Long term (current) use of anticoagulants: Secondary | ICD-10-CM

## 2013-06-22 DIAGNOSIS — I4891 Unspecified atrial fibrillation: Secondary | ICD-10-CM

## 2013-06-22 LAB — POCT INR: INR: 3

## 2013-07-03 ENCOUNTER — Encounter: Payer: Self-pay | Admitting: Internal Medicine

## 2013-07-03 ENCOUNTER — Ambulatory Visit (INDEPENDENT_AMBULATORY_CARE_PROVIDER_SITE_OTHER): Payer: Medicare Other | Admitting: Internal Medicine

## 2013-07-03 VITALS — BP 124/72 | HR 84 | Ht 62.0 in | Wt 199.5 lb

## 2013-07-03 DIAGNOSIS — Z950 Presence of cardiac pacemaker: Secondary | ICD-10-CM

## 2013-07-03 DIAGNOSIS — I5032 Chronic diastolic (congestive) heart failure: Secondary | ICD-10-CM

## 2013-07-03 DIAGNOSIS — R209 Unspecified disturbances of skin sensation: Secondary | ICD-10-CM

## 2013-07-03 DIAGNOSIS — I509 Heart failure, unspecified: Secondary | ICD-10-CM

## 2013-07-03 DIAGNOSIS — R200 Anesthesia of skin: Secondary | ICD-10-CM | POA: Insufficient documentation

## 2013-07-03 DIAGNOSIS — I442 Atrioventricular block, complete: Secondary | ICD-10-CM

## 2013-07-03 DIAGNOSIS — R202 Paresthesia of skin: Secondary | ICD-10-CM

## 2013-07-03 DIAGNOSIS — I4891 Unspecified atrial fibrillation: Secondary | ICD-10-CM

## 2013-07-03 LAB — MDC_IDC_ENUM_SESS_TYPE_INCLINIC
Battery Remaining Longevity: 43 mo
Battery Voltage: 2.99 V
Brady Statistic AP VP Percent: 0 %
Brady Statistic AS VP Percent: 100 %
Brady Statistic RA Percent Paced: 0 %
Brady Statistic RV Percent Paced: 100 %
Date Time Interrogation Session: 20150127124420
Lead Channel Impedance Value: 1140 Ohm
Lead Channel Impedance Value: 4047 Ohm
Lead Channel Impedance Value: 437 Ohm
Lead Channel Impedance Value: 646 Ohm
Lead Channel Impedance Value: 665 Ohm
Lead Channel Impedance Value: 760 Ohm
Lead Channel Impedance Value: 779 Ohm
Lead Channel Pacing Threshold Amplitude: 0.75 V
Lead Channel Pacing Threshold Amplitude: 1.5 V
Lead Channel Pacing Threshold Pulse Width: 0.4 ms
Lead Channel Setting Pacing Amplitude: 2 V
Lead Channel Setting Pacing Pulse Width: 1.5 ms
Lead Channel Setting Sensing Sensitivity: 0.9 mV
MDC IDC MSMT LEADCHNL LV PACING THRESHOLD PULSEWIDTH: 1.5 ms
MDC IDC MSMT LEADCHNL RA IMPEDANCE VALUE: 4047 Ohm
MDC IDC MSMT LEADCHNL RV IMPEDANCE VALUE: 513 Ohm
MDC IDC SET LEADCHNL LV PACING AMPLITUDE: 2 V
MDC IDC SET LEADCHNL RV PACING PULSEWIDTH: 0.4 ms
MDC IDC STAT BRADY AP VS PERCENT: 0 %
MDC IDC STAT BRADY AS VS PERCENT: 0 %
Zone Setting Detection Interval: 150 ms
Zone Setting Detection Interval: 400 ms

## 2013-07-03 NOTE — Patient Instructions (Signed)
Your physician wants you to follow-up in: 1 year with Dr. Klein. You will receive a reminder letter in the mail two months in advance. If you don't receive a letter, please call our office to schedule the follow-up appointment.  Follow up with device clinic in 6 months.  

## 2013-07-03 NOTE — Assessment & Plan Note (Signed)
The patient's device was interrogated.  The information was reviewed. No changes were made in the programming.    

## 2013-07-03 NOTE — Assessment & Plan Note (Signed)
Permanent. She apparently had a recent TIA associated with loss of speech. Her INR targets are about 3. She is however quite labile. I suggested that he discuss with Danae Chen and Dr. Deidre Ala the role of a NOAC.

## 2013-07-03 NOTE — Progress Notes (Signed)
Patient Care Team: [name], MD as PCP - General (Family Medicine)   HPI  Jacqueline Lozano is a 79 y.o. female seen in followup for pacemaker implantation for tachybradycardia syndrome. She has a history of permanent atrial fibrillation and status post CRT P. Comes in today not with any specific cardiac complaints she has chronic shortness of breath.  She also has complaints of tingling in her right greater than left hand. She has had bilateral carpal tunnel syndrome.  She underwent sleep study this fall. They were interrupted because of "complete heart block/asystole" it lasted for 40 seconds plus. For sleep studies were unrevealing.    Apparently she has thyroid nodules. Biopsies are planned  Cardiac symptoms are stable   Past Medical History  Diagnosis Date  . Hypertension   . Hypothyroidism   . GERD (gastroesophageal reflux disease)   . Migraine   . OSA (obstructive sleep apnea)     Not consistently using CPAP  . Allergic to IV contrast   . PAD (peripheral artery disease)     Occlusive disease involving left PT and AT  . Hyperlipidemia     Has been unable to take statins due to muscle pain. Has tried multiple statins per her report.  . Abnormal echocardiogram 09/2009    EF >55%, mild LVH, moderate LAE, normal RV, normal PA systolic pressure  . Paroxysmal atrial fibrillation     Breakthrough a fib on sotalol, changed to amiodarone 4/11. DCCV 4/11  . Chronic diastolic CHF (congestive heart failure)     Past Surgical History  Procedure Laterality Date  . Cardiac catheterization      Left heart cath x3 in teh past, last 5 yrs ago. All "normal" per pt report  . Pacemaker insertion  06/12/2010  . Atrial ablation surgery  06/12/2010  . Appendectomy    . Cholecystectomy open    . Vesicovaginal fistula closure w/ tah    . Carpal tunnel release    . Cardioversion      Current Outpatient Prescriptions  Medication Sig Dispense Refill  . Alum & Mag  Hydroxide-Simeth (DI-GEL PO) Take by mouth as needed.      Marland Kitchen amLODipine (NORVASC) 5 MG tablet Take 1 tablet (5 mg total) by mouth daily.  180 tablet  3  . budesonide-formoterol (SYMBICORT) 160-4.5 MCG/ACT inhaler Inhale 2 puffs into the lungs 2 (two) times daily as needed.      Marland Kitchen COUMADIN 4 MG tablet Take as directed by anticoagulation clinic  60 tablet  3  . diphenoxylate-atropine (LOMOTIL) 2.5-0.025 MG per tablet Take 1 tablet by mouth 4 (four) times daily as needed.        . famotidine (PEPCID) 20 MG tablet Take 20 mg by mouth 2 (two) times daily.      . furosemide (LASIX) 20 MG tablet Take 1 tablet (20 mg total) by mouth 2 (two) times daily.  180 tablet  3  . levothyroxine (SYNTHROID, LEVOTHROID) 112 MCG tablet Take 112 mcg by mouth daily before breakfast.      . metoprolol (LOPRESSOR) 50 MG tablet Take 1.5 tablets (75 mg total) by mouth 2 (two) times daily.  60 tablet  4  . nitroGLYCERIN (NITROSTAT) 0.4 MG SL tablet Place 1 tablet (0.4 mg total) under the tongue every 5 (five) minutes as needed for chest pain.  25 tablet  3  . potassium chloride (K-DUR) 10 MEQ tablet Take 1 tablet (10 mEq total) by mouth daily.  90 tablet  3  . prochlorperazine (COMPAZINE) 5 MG tablet Take 1 tablet (5 mg total) by mouth every 6 (six) hours as needed.  90 tablet  5  . VITAMIN B1-B12 IM Inject into the muscle every 30 (thirty) days.      Marland Kitchen VITAMIN D, CHOLECALCIFEROL, PO Take 5,000 Units by mouth 3 (three) times a week.       No current facility-administered medications for this visit.    Allergies  Allergen Reactions  . Ivp Dye [Iodinated Diagnostic Agents] Shortness Of Breath  . Codeine   . Iodine   . Morphine   . Oxycodone-Acetaminophen   . Persantine [Dipyridamole]   . Statins   . Sulfonamide Derivatives     Review of Systems negative except from HPI and PMH  Physical Exam BP 124/72  Pulse 84  Ht 5\' 2"  (1.575 m)  Wt 199 lb 8 oz (90.493 kg)  BMI 36.48 kg/m2 Well developed and well nourished  in no acute distress HENT normal E scleral and icterus clear Neck Supple JVP flat; carotids brisk and full Clear to ausculation Device pocket well healed; without hematoma or erythema.  There is no tethering *Regular rate and rhythm, no murmurs gallops or rub Soft with active bowel sounds No clubbing cyanosis Trace Edema Alert and oriented, grossly normal motor and sensory function Skin Warm and Dry    Assessment and  Plan

## 2013-07-03 NOTE — Assessment & Plan Note (Addendum)
Euvolemic continue current meds  Interestingly, activity markers suggesting marked increase since May. She does not quite know what the difference is.

## 2013-07-03 NOTE — Assessment & Plan Note (Signed)
Have asked her to see her PCP to understand paresthesias

## 2013-07-03 NOTE — Assessment & Plan Note (Signed)
Stable post pacing 

## 2013-07-05 ENCOUNTER — Telehealth: Payer: Self-pay

## 2013-07-05 NOTE — Telephone Encounter (Signed)
Pt has some questions from her visit yesterday.(She is reading her AVS, and doesn't know what some things mean)

## 2013-07-05 NOTE — Telephone Encounter (Signed)
Reviewed AVS with patient.  Patient verbalized understanding.

## 2013-07-18 ENCOUNTER — Ambulatory Visit (INDEPENDENT_AMBULATORY_CARE_PROVIDER_SITE_OTHER): Payer: Medicare Other | Admitting: Pharmacist

## 2013-07-18 DIAGNOSIS — I4891 Unspecified atrial fibrillation: Secondary | ICD-10-CM

## 2013-07-18 DIAGNOSIS — Z7901 Long term (current) use of anticoagulants: Secondary | ICD-10-CM

## 2013-07-18 DIAGNOSIS — Z5181 Encounter for therapeutic drug level monitoring: Secondary | ICD-10-CM

## 2013-07-18 LAB — POCT INR: INR: 2.8

## 2013-08-15 ENCOUNTER — Ambulatory Visit (INDEPENDENT_AMBULATORY_CARE_PROVIDER_SITE_OTHER): Payer: Medicare Other

## 2013-08-15 DIAGNOSIS — I4891 Unspecified atrial fibrillation: Secondary | ICD-10-CM

## 2013-08-15 DIAGNOSIS — Z5181 Encounter for therapeutic drug level monitoring: Secondary | ICD-10-CM

## 2013-08-15 LAB — POCT INR: INR: 3.4

## 2013-09-05 ENCOUNTER — Ambulatory Visit (INDEPENDENT_AMBULATORY_CARE_PROVIDER_SITE_OTHER): Payer: Medicare Other

## 2013-09-05 DIAGNOSIS — Z5181 Encounter for therapeutic drug level monitoring: Secondary | ICD-10-CM

## 2013-09-05 DIAGNOSIS — I4891 Unspecified atrial fibrillation: Secondary | ICD-10-CM

## 2013-09-05 LAB — POCT INR: INR: 2.2

## 2013-10-08 ENCOUNTER — Ambulatory Visit: Payer: Self-pay | Admitting: Family Medicine

## 2013-10-10 ENCOUNTER — Ambulatory Visit (INDEPENDENT_AMBULATORY_CARE_PROVIDER_SITE_OTHER): Payer: Medicare Other

## 2013-10-10 DIAGNOSIS — I4891 Unspecified atrial fibrillation: Secondary | ICD-10-CM

## 2013-10-10 DIAGNOSIS — Z5181 Encounter for therapeutic drug level monitoring: Secondary | ICD-10-CM

## 2013-10-10 LAB — POCT INR: INR: 2.9

## 2013-11-07 ENCOUNTER — Ambulatory Visit (INDEPENDENT_AMBULATORY_CARE_PROVIDER_SITE_OTHER): Payer: Medicare Other

## 2013-11-07 DIAGNOSIS — I4891 Unspecified atrial fibrillation: Secondary | ICD-10-CM

## 2013-11-07 DIAGNOSIS — Z5181 Encounter for therapeutic drug level monitoring: Secondary | ICD-10-CM

## 2013-11-07 LAB — POCT INR: INR: 2.7

## 2013-11-20 ENCOUNTER — Ambulatory Visit (INDEPENDENT_AMBULATORY_CARE_PROVIDER_SITE_OTHER): Payer: Medicare Other | Admitting: Cardiovascular Disease

## 2013-11-20 ENCOUNTER — Telehealth: Payer: Self-pay | Admitting: *Deleted

## 2013-11-20 ENCOUNTER — Encounter: Payer: Self-pay | Admitting: Cardiovascular Disease

## 2013-11-20 VITALS — BP 130/62 | HR 78 | Ht 62.0 in | Wt 208.5 lb

## 2013-11-20 DIAGNOSIS — I5032 Chronic diastolic (congestive) heart failure: Secondary | ICD-10-CM

## 2013-11-20 DIAGNOSIS — R0602 Shortness of breath: Secondary | ICD-10-CM

## 2013-11-20 DIAGNOSIS — I509 Heart failure, unspecified: Secondary | ICD-10-CM

## 2013-11-20 DIAGNOSIS — R5383 Other fatigue: Secondary | ICD-10-CM

## 2013-11-20 DIAGNOSIS — R5381 Other malaise: Secondary | ICD-10-CM

## 2013-11-20 DIAGNOSIS — I4891 Unspecified atrial fibrillation: Secondary | ICD-10-CM

## 2013-11-20 DIAGNOSIS — I10 Essential (primary) hypertension: Secondary | ICD-10-CM

## 2013-11-20 MED ORDER — APIXABAN 5 MG PO TABS: 5.0000 mg | ORAL_TABLET | Freq: Two times a day (BID) | ORAL | Status: DC

## 2013-11-20 NOTE — Assessment & Plan Note (Signed)
Notes indicate permanent atrial fibrillation. History of AV node ablation. On anticoagulation. We did discuss using a NOAC. We will send in a prescription for eliquis 5 mg twice a day. She will check the Price

## 2013-11-20 NOTE — Assessment & Plan Note (Signed)
Blood pressure is well controlled on today's visit. No changes made to the medications. 

## 2013-11-20 NOTE — Progress Notes (Signed)
Patient ID: Jacqueline Lozano, female    DOB: 1934/12/20, 79 y.o.   MRN: [id number]  HPI Comments: Jacqueline Lozano is a pleasant 7 -year-old woman with a history of atrial fibrillation, status post arteriovenous node ablation, Pacer/CRT-P, Hypertension, Paroxysmal nocturnal dyspnea, Hypothyroidism, Diastolic congestive heart failure, C. difficile infection, who presents for followup. Previous pacemaker interrogation shows that she is  ventricularly paced, permanent atrial fibrillation  In followup, she complains of having fatigue, chronic shortness of breath with exertion. Reports that she is dizzy all the time, likely from vertigo. Also feels very sore physically in the morning. She is concerned about her carpal tunnel.  She worries about her shortness of breath the most. She does not do any regular exercise, is relatively sedentary. She's worried about side effects of amlodipine and has been reading the medication information  She reports having sleep study in October and again in November. Sleep study in October suggested she repeat the test as it was incomplete. The note indicates the study was discontinued as the patient had heart block, with limited EKG monitoring appearing to demonstrate a 43 second period of complete heart block with ventricular standstill. Atrial rate was 80 beats per minute. Note indicates the patient was awake and alert during the event with some dizziness. Pacer was checked after this event,  Normal functioning device.  Followup sleep study 04/24/2013 recommended weight loss, though there was no indications for CPAP at this time.  Recent stress test showed no ischemia Echocardiogram showed normal LV function, right ventricular systolic pressure high normal.  EKG shows paced rhythm 78 beats per minute Recent lab work showing TSH 0.067, because 110, creatinine 1.0      Outpatient Encounter Prescriptions as of 11/20/2013  Medication Sig  . Alum & Mag Hydroxide-Simeth  (DI-GEL PO) Take by mouth as needed.  Marland Kitchen amLODipine (NORVASC) 5 MG tablet Take 5 mg by mouth daily.  . budesonide-formoterol (SYMBICORT) 160-4.5 MCG/ACT inhaler Inhale 2 puffs into the lungs 2 (two) times daily as needed.  Marland Kitchen COUMADIN 4 MG tablet Take as directed by anticoagulation clinic  . diphenoxylate-atropine (LOMOTIL) 2.5-0.025 MG per tablet Take 1 tablet by mouth 4 (four) times daily as needed.    . famotidine (PEPCID) 20 MG tablet Take 20 mg by mouth 2 (two) times daily.  . furosemide (LASIX) 20 MG tablet Take 1 tablet (20 mg total) by mouth 2 (two) times daily.  Marland Kitchen levothyroxine (SYNTHROID, LEVOTHROID) 112 MCG tablet Take 112 mcg by mouth daily before breakfast.  . metoprolol (LOPRESSOR) 50 MG tablet Take 1.5 tablets (75 mg total) by mouth 2 (two) times daily.  . nitroGLYCERIN (NITROSTAT) 0.4 MG SL tablet Place 1 tablet (0.4 mg total) under the tongue every 5 (five) minutes as needed for chest pain.  . potassium chloride (K-DUR) 10 MEQ tablet Take 1 tablet (10 mEq total) by mouth daily.  . prochlorperazine (COMPAZINE) 5 MG tablet Take 1 tablet (5 mg total) by mouth every 6 (six) hours as needed.  Marland Kitchen VITAMIN B1-B12 IM Inject into the muscle every 30 (thirty) days.  Marland Kitchen VITAMIN D, CHOLECALCIFEROL, PO Take 5,000 Units by mouth 3 (three) times a week.     Review of Systems  Constitutional: Positive for fatigue.  HENT: Negative.   Eyes: Negative.   Respiratory: Positive for shortness of breath.   Cardiovascular: Negative.   Gastrointestinal: Negative.   Endocrine: Negative.   Musculoskeletal: Negative.   Skin: Negative.   Allergic/Immunologic: Negative.   Neurological: Negative.   Hematological: Negative.  Psychiatric/Behavioral: Negative.   All other systems reviewed and are negative.   BP 130/62  Pulse 78  Ht 5\' 2"  (1.575 m)  Wt 208 lb 8 oz (94.575 kg)  BMI 38.13 kg/m2  Physical Exam  Nursing note and vitals reviewed. Constitutional: She is oriented to person, place, and  time. She appears well-developed and well-nourished.  Obese  HENT:  Head: Normocephalic.  Nose: Nose normal.  Mouth/Throat: Oropharynx is clear and moist.  Eyes: Conjunctivae are normal. Pupils are equal, round, and reactive to light.  Neck: Normal range of motion. Neck supple. No JVD present.  Cardiovascular: Normal rate, regular rhythm, S1 normal, S2 normal and intact distal pulses.  Exam reveals no gallop and no friction rub.   Murmur heard.  Crescendo systolic murmur is present with a grade of 2/6  Pulmonary/Chest: Effort normal and breath sounds normal. No respiratory distress. She has no wheezes. She has no rales. She exhibits no tenderness.  Abdominal: Soft. Bowel sounds are normal. She exhibits no distension. There is no tenderness.  Musculoskeletal: Normal range of motion. She exhibits no edema and no tenderness.  Lymphadenopathy:    She has no cervical adenopathy.  Neurological: She is alert and oriented to person, place, and time. Coordination normal.  Skin: Skin is warm and dry. No rash noted. No erythema.  Psychiatric: She has a normal mood and affect. Her behavior is normal. Judgment and thought content normal.    Assessment and Plan

## 2013-11-20 NOTE — Assessment & Plan Note (Signed)
Chronic fatigue, shortness of breath. Suspect sedentary lifestyle also contributing to her symptoms

## 2013-11-20 NOTE — Telephone Encounter (Signed)
Spoke w/ pt.  She reports that she called [facility] and they cannot give her an exact price on xarelto and eliquis, but the eliquis looks to be cheaper.  She would like to have rx sent in for Eliquis so that they can run her ins to find out a specific price for her.  She will call if this is too expensive.  Pt verbalizes understanding to call me and let me know if she decides to pick it and NOT to take eliquis w/ coumadin. She will call back to let us know which she decides on so that we may update her chart.

## 2013-11-20 NOTE — Telephone Encounter (Signed)
Please call patient confused on her new medication.

## 2013-11-20 NOTE — Assessment & Plan Note (Addendum)
Etiology is unclear though likely multifactorial including obesity, deconditioning/sedentary, chronic atrial fibrillation/V paced rhythm. Uncertain if further pacer modifications could be made for symptom relief. We'll set up followup visit with Dr. Caryl Comes. Uncertain if she may benefit from cardiac rehabilitation.

## 2013-11-20 NOTE — Assessment & Plan Note (Signed)
She appears relatively euvolemic on today's visit. We have recommended that she stay on Lasix 20 mg twice a day

## 2013-11-20 NOTE — Patient Instructions (Addendum)
Please hold the amlodipine for two weeks If you feel better, less dizzy, call the office If no difference, then restart the amloidpine  Please ask the pharmacist about the cost of the other blood thinners (instead of warfarin) Eliquis 5 mg twice a day Or Xarelto 20 mg once a day 30 days or 90s days  Please call us if you have new issues that need to be addressed before your next appt.  Your physician wants you to follow-up in: 6 months.  You will receive a reminder letter in the mail two months in advance. If you don't receive a letter, please call our office to schedule the follow-up appointment.

## 2013-11-21 ENCOUNTER — Telehealth: Payer: Self-pay

## 2013-11-21 NOTE — Telephone Encounter (Signed)
Spoke with Marlowe Kays with [employer] regarding a prior authorization for eliquis 5 mg take one tablet twice a day has been approved from 11/20/2013 to 11/21/2014. Spoke with pharmacist at [facility] on [address] that the prior authorization # is [id number].

## 2013-11-21 NOTE — Telephone Encounter (Signed)
Spoke w/ pt.  She reports that she went to pick up Eliquis last night, but pharmacy told her that they could not fill rx until Dr. Rockey Situ speaks w/ her ins co. Prior authorization sent to pharmacy.

## 2013-11-23 ENCOUNTER — Telehealth: Payer: Self-pay

## 2013-11-23 NOTE — Telephone Encounter (Signed)
Pt called and states her PCP has not heard from Dr. Rockey Situ, states that she hasnt taken any of her thyroid medication since Tuesday. Please call.

## 2013-11-23 NOTE — Telephone Encounter (Signed)
Spoke w/ pt.  She reports that she was under the impression that Dr. Rockey Situ asked her to stop her thyroid meds. Spoke w/ Dr. Rockey Situ and advised pt that Dr. Rockey Situ has not made any changes to her thyroid meds, that he and pt had discussed speaking w/ Dr. Kary Kos, as Dr. Rockey Situ cannot adjust her synthroid.  Advised pt to resume her previous dose of synthroid and to contact Dr. Barbarann Ehlers office. Reviewed pt's med list w/ her.  She is agreeable and will contact our office w/ any further questions or concerns.

## 2013-12-04 ENCOUNTER — Telehealth: Payer: Self-pay

## 2013-12-04 NOTE — Telephone Encounter (Signed)
Spoke w/ pt.  She reports that she has been holding her amlodipine, as instructed, to see if her dizziness improved.  She reports that her dizziness has actually gotten worse since being off of it.  Pt has no BP readings to report, as she has 2 monitors that she states have different readings.  Advised pt use her arm cuff, and not her wrist monitor, keep a record of her readings for the next few days and call me w/ these.  She is agreeable and will call later this week.

## 2013-12-04 NOTE — Telephone Encounter (Signed)
Pt has question regarding medications. Please call.

## 2013-12-17 ENCOUNTER — Telehealth: Payer: Self-pay | Admitting: *Deleted

## 2013-12-17 NOTE — Telephone Encounter (Signed)
Blood Pressure for a week 12/05/13  am 151/75  p 77   pm 152/84 p 78 12/06/13  am 143/72   p 77   pm  147/74 p78 12/07/13 am 130/71 p 80  pm 137/73 p 78 12/08/13 am 140/76 p 77 pm  143/72 p 76 12/09/13 am 145/71 p 80 pm 153/72 p 77 12/10/13 am 163/89 p 77 pm 143/67 p 77 12/11/13 am 143/67 p 77 pm 149/81 p 78 12/12/13 am 139/75 p 76 pm 155/77 p 77 12/13/13 am 164/86 p 76 pm 160/80 p 79 12/14/13 am 159/86 p 79 pm 145/79 p 79

## 2013-12-19 ENCOUNTER — Other Ambulatory Visit: Payer: Self-pay | Admitting: Cardiovascular Disease

## 2013-12-19 MED ORDER — LISINOPRIL 10 MG PO TABS: 10.0000 mg | ORAL_TABLET | Freq: Every day | ORAL | Status: DC

## 2013-12-19 NOTE — Telephone Encounter (Signed)
Spoke w/ pt.  Advised her of Dr. Donivan Scull recommendation.  She states that she has not been taking her amlodipine and does not want to restart this.  She is agreeable to starting lisinopril and will call w/ further questions or concerns.

## 2013-12-19 NOTE — Telephone Encounter (Signed)
Blood pressures are mildly elevated Would start lisinopril 10 mg daily with her amlodipine 5 mg daily  Let us know if she is not taking her amlodipine or she would like to stop this medication She was ordered about side effects on her last office visit  Continue to track the blood pressure

## 2014-01-04 ENCOUNTER — Ambulatory Visit (INDEPENDENT_AMBULATORY_CARE_PROVIDER_SITE_OTHER): Payer: Medicare Other | Admitting: *Deleted

## 2014-01-04 DIAGNOSIS — I4891 Unspecified atrial fibrillation: Secondary | ICD-10-CM

## 2014-01-04 DIAGNOSIS — I442 Atrioventricular block, complete: Secondary | ICD-10-CM

## 2014-01-04 LAB — MDC_IDC_ENUM_SESS_TYPE_INCLINIC
Battery Remaining Longevity: 39 mo
Battery Voltage: 2.97 V
Brady Statistic AP VP Percent: 0 %
Brady Statistic AS VP Percent: 100 %
Brady Statistic AS VS Percent: 0 %
Date Time Interrogation Session: 20150731151413
Lead Channel Impedance Value: 1216 Ohm
Lead Channel Impedance Value: 494 Ohm
Lead Channel Impedance Value: 665 Ohm
Lead Channel Impedance Value: 741 Ohm
Lead Channel Pacing Threshold Amplitude: 0.75 V
Lead Channel Pacing Threshold Amplitude: 1.75 V
Lead Channel Pacing Threshold Pulse Width: 1.5 ms
Lead Channel Sensing Intrinsic Amplitude: 5.5 mV
Lead Channel Sensing Intrinsic Amplitude: 5.5 mV
Lead Channel Setting Pacing Amplitude: 2 V
Lead Channel Setting Pacing Amplitude: 2.25 V
Lead Channel Setting Pacing Pulse Width: 0.4 ms
Lead Channel Setting Sensing Sensitivity: 0.9 mV
MDC IDC MSMT LEADCHNL LV IMPEDANCE VALUE: 779 Ohm
MDC IDC MSMT LEADCHNL LV IMPEDANCE VALUE: 855 Ohm
MDC IDC MSMT LEADCHNL RA IMPEDANCE VALUE: 4047 Ohm
MDC IDC MSMT LEADCHNL RA IMPEDANCE VALUE: 4047 Ohm
MDC IDC MSMT LEADCHNL RV IMPEDANCE VALUE: 437 Ohm
MDC IDC MSMT LEADCHNL RV PACING THRESHOLD PULSEWIDTH: 0.4 ms
MDC IDC SET LEADCHNL LV PACING PULSEWIDTH: 1.5 ms
MDC IDC SET ZONE DETECTION INTERVAL: 150 ms
MDC IDC SET ZONE DETECTION INTERVAL: 400 ms
MDC IDC STAT BRADY AP VS PERCENT: 0 %
MDC IDC STAT BRADY RA PERCENT PACED: 0 %
MDC IDC STAT BRADY RV PERCENT PACED: 100 %

## 2014-01-04 NOTE — Progress Notes (Signed)
Bi V PPM check in office with [facility].

## 2014-01-07 ENCOUNTER — Telehealth: Payer: Self-pay

## 2014-01-07 NOTE — Telephone Encounter (Signed)
Pt states she is going to see her knee Dr. Marylene Buerger, and wants to know if she can take a cortisone shot tomorrow due to her being on Eliquis

## 2014-01-07 NOTE — Telephone Encounter (Signed)
Would hold eliquis for two days prior to cortisone shot ideally Would ask ortho, but that is typically what we do

## 2014-01-08 ENCOUNTER — Emergency Department: Payer: Self-pay | Admitting: Emergency Medicine

## 2014-01-08 NOTE — Telephone Encounter (Signed)
Left message for pt to call back  °

## 2014-01-08 NOTE — Telephone Encounter (Signed)
Spoke w/ pt.  She reports that she went to [facility] ED at 4 this am.  Reports that she was hoping to get another cortisone injection, as she cannot wait until her next appt in 6 mos for this.  Reports that they did not give this to her, but gave her pain pills, "a little stronger than advil", but she doesn't feel that they are helping.  Advised her of Dr. Donivan Scull recommendation.  She verbalizes understanding and will call w/ further questions or concerns.

## 2014-01-30 ENCOUNTER — Ambulatory Visit: Payer: Self-pay | Admitting: Cardiovascular Disease

## 2014-01-30 DIAGNOSIS — I4891 Unspecified atrial fibrillation: Secondary | ICD-10-CM

## 2014-01-30 DIAGNOSIS — Z5181 Encounter for therapeutic drug level monitoring: Secondary | ICD-10-CM

## 2014-02-01 ENCOUNTER — Encounter: Payer: Self-pay | Admitting: Internal Medicine

## 2014-02-27 ENCOUNTER — Other Ambulatory Visit: Payer: Self-pay | Admitting: Cardiovascular Disease

## 2014-04-15 ENCOUNTER — Telehealth: Payer: Self-pay

## 2014-04-15 NOTE — Telephone Encounter (Signed)
Pt dentist called, states pt needs extraction and needs to come off her Eliquis . Please call and advise.

## 2014-04-15 NOTE — Telephone Encounter (Signed)
Spoke w/ rep from dentist office.  Advised her to fax cardiac clearance request to our office.  She verbalizes understanding and will send this over.

## 2014-05-20 ENCOUNTER — Ambulatory Visit (INDEPENDENT_AMBULATORY_CARE_PROVIDER_SITE_OTHER): Payer: Medicare Other | Admitting: Cardiovascular Disease

## 2014-05-20 ENCOUNTER — Encounter: Payer: Self-pay | Admitting: Cardiovascular Disease

## 2014-05-20 VITALS — BP 140/70 | HR 77 | Ht 63.0 in | Wt 210.5 lb

## 2014-05-20 DIAGNOSIS — R0602 Shortness of breath: Secondary | ICD-10-CM

## 2014-05-20 DIAGNOSIS — I5032 Chronic diastolic (congestive) heart failure: Secondary | ICD-10-CM

## 2014-05-20 DIAGNOSIS — I10 Essential (primary) hypertension: Secondary | ICD-10-CM

## 2014-05-20 DIAGNOSIS — Z7901 Long term (current) use of anticoagulants: Secondary | ICD-10-CM

## 2014-05-20 DIAGNOSIS — I4891 Unspecified atrial fibrillation: Secondary | ICD-10-CM

## 2014-05-20 DIAGNOSIS — E785 Hyperlipidemia, unspecified: Secondary | ICD-10-CM

## 2014-05-20 MED ORDER — FUROSEMIDE 40 MG PO TABS: 40.0000 mg | ORAL_TABLET | Freq: Two times a day (BID) | ORAL | Status: DC

## 2014-05-20 NOTE — Progress Notes (Signed)
Patient ID: Jacqueline Lozano, female    DOB: 1935-05-22, 79 y.o.   MRN: [id number]  HPI Comments: Jacqueline Lozano is a pleasant 79 -year-old woman with a history of atrial fibrillation, status post arteriovenous node ablation, Pacer/CRT-P, Hypertension, Paroxysmal nocturnal dyspnea, Hypothyroidism, Diastolic congestive heart failure, C. difficile infection, who presents for followup of her shortness of breath. Previous pacemaker interrogation shows that she is  ventricularly paced, permanent atrial fibrillation  In follow-up today, she continues to complain of shortness of breath. She is unable to walk very far without having to stop secondary to symptoms. She does not walking in stores anymore without being in a wheelchair or scooter. Previous he tried cardiac/pulmonary rehabilitation but had worsening knee pain and had to stop Does not do any regular exercise She denies any significant leg edema, no PND or orthopnea Weight continues to be a problem Prior echocardiogram in 2014 showed high normal right heart pressures otherwise normal ejection fraction  Previously had dizziness. Not an issue on today's visit EKG on today's visit shows paced rhythm at 77 bpm She was ambulated in the clinic. Saturation 99% at rest, heart rate 87. After walking down the hallway once she had severe shortness of breath, needed assistance back to the room, 99% oxygen, heart rate 105   Other past medical history  he reports having sleep study in October and again in November. Sleep study in October suggested she repeat the test as it was incomplete. The note indicates the study was discontinued as the patient had heart block, with limited EKG monitoring appearing to demonstrate a 43 second period of complete heart block with ventricular standstill. Atrial rate was 80 beats per minute. Note indicates the patient was awake and alert during the event with some dizziness. Pacer was checked after this event,  Normal  functioning device.  Followup sleep study 04/24/2013 recommended weight loss, though there was no indications for CPAP at this time. Previouss test showed no ischemia        Allergies  Allergen Reactions  . Ivp Dye [Iodinated Diagnostic Agents] Shortness Of Breath  . Codeine   . Iodine   . Morphine   . Oxycodone-Acetaminophen   . Persantine [Dipyridamole]   . Statins   . Sulfonamide Derivatives     Outpatient Encounter Prescriptions as of 05/20/2014  Medication Sig  . Alum & Mag Hydroxide-Simeth (DI-GEL PO) Take by mouth as needed.  Marland Kitchen apixaban (ELIQUIS) 5 MG TABS tablet Take 1 tablet (5 mg total) by mouth 2 (two) times daily.  . budesonide-formoterol (SYMBICORT) 160-4.5 MCG/ACT inhaler Inhale 2 puffs into the lungs 2 (two) times daily as needed.  . diphenoxylate-atropine (LOMOTIL) 2.5-0.025 MG per tablet Take 1 tablet by mouth 4 (four) times daily as needed.    . famotidine (PEPCID) 20 MG tablet Take 20 mg by mouth 2 (two) times daily.  . furosemide (LASIX) 20 MG tablet Take 1 tablet (20 mg total) by mouth 2 (two) times daily.  Marland Kitchen levothyroxine (SYNTHROID, LEVOTHROID) 112 MCG tablet Take 112 mcg by mouth daily before breakfast.  . lisinopril (PRINIVIL,ZESTRIL) 10 MG tablet Take 1 tablet (10 mg total) by mouth daily.  . metoprolol (LOPRESSOR) 50 MG tablet TAKE ONE AND ONE-HALF TABLETS BY MOUTH TWICE DAILY   . nitroGLYCERIN (NITROSTAT) 0.4 MG SL tablet Place 1 tablet (0.4 mg total) under the tongue every 5 (five) minutes as needed for chest pain.  . potassium chloride (K-DUR) 10 MEQ tablet Take 1 tablet (10 mEq total) by mouth  daily.  . prochlorperazine (COMPAZINE) 5 MG tablet Take 1 tablet (5 mg total) by mouth every 6 (six) hours as needed.  Marland Kitchen VITAMIN B1-B12 IM Inject into the muscle every 30 (thirty) days.  Marland Kitchen VITAMIN D, CHOLECALCIFEROL, PO Take 5,000 Units by mouth 3 (three) times a week.    Past Medical History  Diagnosis Date  . Hypertension   . Hypothyroidism   . GERD  (gastroesophageal reflux disease)   . Migraine   . OSA (obstructive sleep apnea)     Not consistently using CPAP  . Allergic to IV contrast   . PAD (peripheral artery disease)     Occlusive disease involving left PT and AT  . Hyperlipidemia     Has been unable to take statins due to muscle pain. Has tried multiple statins per her report.  . Abnormal echocardiogram 09/2009    EF >55%, mild LVH, moderate LAE, normal RV, normal PA systolic pressure  . Paroxysmal atrial fibrillation     Breakthrough a fib on sotalol, changed to amiodarone 4/11. DCCV 4/11  . Chronic diastolic CHF (congestive heart failure)   . Pacemaker-Medtronic CRT P. 11/23/2010    Atrial port was plugged   . Complete heart block-S./P. AV junction ablation 11/23/2010    Past Surgical History  Procedure Laterality Date  . Cardiac catheterization      Left heart cath x3 in teh past, last 5 yrs ago. All "normal" per pt report  . Pacemaker insertion  06/12/2010  . Atrial ablation surgery  06/12/2010  . Appendectomy    . Cholecystectomy open    . Vesicovaginal fistula closure w/ tah    . Carpal tunnel release    . Cardioversion      Social History  reports that she has never smoked. She has never used smokeless tobacco. She reports that she does not drink alcohol or use illicit drugs.  Family History family history includes Colon cancer in her daughter; Heart attack in her father and mother.  Review of Systems  Constitutional: Negative.   HENT: Negative.   Respiratory: Positive for shortness of breath.   Cardiovascular: Negative.   Gastrointestinal: Negative.   Skin: Negative.   Neurological: Negative.   Hematological: Negative.   Psychiatric/Behavioral: Negative.   All other systems reviewed and are negative.   BP 140/70 mmHg  Pulse 77  Ht 5\' 3"  (1.6 m)  Wt 210 lb 8 oz (95.482 kg)  BMI 37.30 kg/m2  Physical Exam  Constitutional: She is oriented to person, place, and time. She appears well-developed  and well-nourished.  Obese  HENT:  Head: Normocephalic.  Nose: Nose normal.  Mouth/Throat: Oropharynx is clear and moist.  Eyes: Conjunctivae are normal. Pupils are equal, round, and reactive to light.  Neck: Normal range of motion. Neck supple. No JVD present.  Cardiovascular: Normal rate, regular rhythm, S1 normal, S2 normal, normal heart sounds and intact distal pulses.  Exam reveals no gallop and no friction rub.   No murmur heard. Pulmonary/Chest: Effort normal and breath sounds normal. No respiratory distress. She has no wheezes. She has no rales. She exhibits no tenderness.  Abdominal: Soft. Bowel sounds are normal. She exhibits no distension. There is no tenderness.  Musculoskeletal: Normal range of motion. She exhibits no edema or tenderness.  Lymphadenopathy:    She has no cervical adenopathy.  Neurological: She is alert and oriented to person, place, and time. Coordination normal.  Skin: Skin is warm and dry. No rash noted. No erythema.  Psychiatric: She  has a normal mood and affect. Her behavior is normal. Judgment and thought content normal.    Assessment and Plan  Nursing note and vitals reviewed.

## 2014-05-20 NOTE — Assessment & Plan Note (Signed)
Shortness of breath is her major complaint which is a chronic issue. Likely multifactorial including deconditioning, obesity, possible component of diastolic CHF and we will increase her Lasix. Underlying arrhythmia including permanent atrial fibrillation and paced rhythm may be contributing to her symptoms. We'll arrange follow-up with Dr. Caryl Comes to see if additional pacemaker changes can be made for her symptoms. She appears to have adequate rate response. No desaturations with exertion on walk test today

## 2014-05-20 NOTE — Assessment & Plan Note (Signed)
Blood pressure is well controlled on today's visit. No changes made to the medications. 

## 2014-05-20 NOTE — Patient Instructions (Addendum)
You are doing well.  Please increase the the lasix up to 40 mg twice a day as needed for shortness of breath Go back to lasix 40 mg once a day if you feel "dry"  Please call us if you have new issues that need to be addressed before your next appt.  Your physician wants you to follow-up in: 6 months.  You will receive a reminder letter in the mail two months in advance. If you don't receive a letter, please call our office to schedule the follow-up appointment.

## 2014-05-20 NOTE — Assessment & Plan Note (Signed)
We have suggested that she increase her Lasix up to 40 mg twice a day then down to 40 mg daily after aggressive diuresis. She will monitor her shortness of breath symptoms

## 2014-05-20 NOTE — Assessment & Plan Note (Signed)
We'll try to obtain the most recent lipid panel from primary care

## 2014-05-20 NOTE — Assessment & Plan Note (Signed)
Tolerating anticoagulation. No bleeding

## 2014-05-20 NOTE — Assessment & Plan Note (Signed)
Tolerating anticoagulation on  Eliquis.

## 2014-05-21 ENCOUNTER — Other Ambulatory Visit: Payer: Self-pay

## 2014-05-22 ENCOUNTER — Telehealth: Payer: Self-pay | Admitting: Internal Medicine

## 2014-05-22 NOTE — Telephone Encounter (Signed)
Patient is very upset and doesn't understand why Dr. Rockey Situ can't refill her lomotil. I explained to her that I understand that it is just Lomotil, but Dr. Rockey Situ did not prescribe the Lomotil and he doesn't normally give Lomotil the PCP would be the one to contact for a refill on Lomotil since Dr. Kary Kos is the prescribing physician.

## 2014-05-22 NOTE — Telephone Encounter (Signed)
Pt was recently seen and she states we were to send in her meds to [facility]. She went today and yesterday and they still do not have it. Please call patient.

## 2014-06-11 ENCOUNTER — Observation Stay: Payer: Self-pay | Admitting: Internal Medicine

## 2014-06-11 ENCOUNTER — Encounter: Payer: Medicare Other | Admitting: Internal Medicine

## 2014-06-11 DIAGNOSIS — I5032 Chronic diastolic (congestive) heart failure: Secondary | ICD-10-CM

## 2014-06-11 DIAGNOSIS — R079 Chest pain, unspecified: Secondary | ICD-10-CM

## 2014-06-11 DIAGNOSIS — I4891 Unspecified atrial fibrillation: Secondary | ICD-10-CM

## 2014-06-11 DIAGNOSIS — R0602 Shortness of breath: Secondary | ICD-10-CM

## 2014-06-11 LAB — CBC WITH DIFFERENTIAL/PLATELET
BASOS PCT: 0.7 %
Basophil #: 0.1 10*3/uL (ref 0.0–0.1)
Eosinophil #: 0.5 10*3/uL (ref 0.0–0.7)
Eosinophil %: 3.8 %
HCT: 38.7 % (ref 35.0–47.0)
HGB: 12.7 g/dL (ref 12.0–16.0)
LYMPHS PCT: 36.2 %
Lymphocyte #: 5.1 10*3/uL — ABNORMAL HIGH (ref 1.0–3.6)
MCH: 30 pg (ref 26.0–34.0)
MCHC: 32.8 g/dL (ref 32.0–36.0)
MCV: 91 fL (ref 80–100)
Monocyte #: 1.5 x10 3/mm — ABNORMAL HIGH (ref 0.2–0.9)
Monocyte %: 10.9 %
NEUTROS PCT: 48.4 %
Neutrophil #: 6.8 10*3/uL — ABNORMAL HIGH (ref 1.4–6.5)
Platelet: 266 10*3/uL (ref 150–440)
RBC: 4.24 10*6/uL (ref 3.80–5.20)
RDW: 13.3 % (ref 11.5–14.5)
WBC: 14.1 10*3/uL — ABNORMAL HIGH (ref 3.6–11.0)

## 2014-06-11 LAB — TROPONIN I
Troponin-I: <0.02 ng/mL
Troponin-I: <0.02 ng/mL

## 2014-06-11 LAB — COMPREHENSIVE METABOLIC PANEL
AST: 33 U/L (ref 15–37)
Albumin: 3.1 g/dL — ABNORMAL LOW (ref 3.4–5.0)
Alkaline Phosphatase: 70 U/L
Anion Gap: 8 (ref 7–16)
BUN: 24 mg/dL — ABNORMAL HIGH (ref 7–18)
Bilirubin,Total: 0.5 mg/dL (ref 0.2–1.0)
CHLORIDE: 108 mmol/L — AB (ref 98–107)
CREATININE: 1.23 mg/dL (ref 0.60–1.30)
Calcium, Total: 8.9 mg/dL (ref 8.5–10.1)
Co2: 23 mmol/L (ref 21–32)
GFR CALC AF AMER: 55 — AB
GFR CALC NON AF AMER: 45 — AB
Glucose: 95 mg/dL (ref 65–99)
Osmolality: 281 (ref 275–301)
POTASSIUM: 5.1 mmol/L (ref 3.5–5.1)
SGPT (ALT): 18 U/L
Sodium: 139 mmol/L (ref 136–145)
Total Protein: 6.9 g/dL (ref 6.4–8.2)

## 2014-06-11 LAB — URINALYSIS, COMPLETE
BILIRUBIN, UR: NEGATIVE
Bacteria: NONE SEEN
Blood: NEGATIVE
Glucose,UR: NEGATIVE mg/dL (ref 0–75)
Hyaline Cast: 3
Ketone: NEGATIVE
Nitrite: NEGATIVE
Ph: 5 (ref 4.5–8.0)
Protein: NEGATIVE
RBC, UR: NONE SEEN /HPF (ref 0–5)
SPECIFIC GRAVITY: 1.017 (ref 1.003–1.030)

## 2014-06-11 LAB — PROTIME-INR
INR: 1.4
Prothrombin Time: 16.6 secs — ABNORMAL HIGH (ref 11.5–14.7)

## 2014-06-11 LAB — CK TOTAL AND CKMB (NOT AT ARMC)
CK, TOTAL: 58 U/L (ref 26–192)
CK, Total: 32 U/L (ref 26–192)
CK-MB: 0.6 ng/mL (ref 0.5–3.6)
CK-MB: 0.5 ng/mL (ref 0.5–3.6)

## 2014-06-12 ENCOUNTER — Encounter: Payer: Self-pay | Admitting: Cardiovascular Disease

## 2014-06-12 ENCOUNTER — Ambulatory Visit (INDEPENDENT_AMBULATORY_CARE_PROVIDER_SITE_OTHER): Payer: Medicare Other | Admitting: Cardiovascular Disease

## 2014-06-12 VITALS — BP 120/60 | HR 80 | Ht 62.0 in | Wt 209.8 lb

## 2014-06-12 DIAGNOSIS — I10 Essential (primary) hypertension: Secondary | ICD-10-CM

## 2014-06-12 DIAGNOSIS — I5032 Chronic diastolic (congestive) heart failure: Secondary | ICD-10-CM

## 2014-06-12 DIAGNOSIS — R0602 Shortness of breath: Secondary | ICD-10-CM

## 2014-06-12 DIAGNOSIS — I4891 Unspecified atrial fibrillation: Secondary | ICD-10-CM

## 2014-06-12 DIAGNOSIS — R0789 Other chest pain: Secondary | ICD-10-CM

## 2014-06-12 DIAGNOSIS — I442 Atrioventricular block, complete: Secondary | ICD-10-CM

## 2014-06-12 MED ORDER — MECLIZINE HCL 25 MG PO TABS: 25.0000 mg | ORAL_TABLET | Freq: Three times a day (TID) | ORAL | Status: DC | PRN

## 2014-06-12 NOTE — Progress Notes (Signed)
Patient ID: Jacqueline Lozano, female    DOB: 1935-05-09, 80 y.o.   MRN: [id number]  HPI Comments: Mrs Yearick is a pleasant 74 -year-old woman with a history of atrial fibrillation, status post arteriovenous node ablation, Pacer/CRT-P, Hypertension, Paroxysmal nocturnal dyspnea, Hypothyroidism, Diastolic congestive heart failure, C. difficile infection, who presents for followup of her shortness of breath. Previous pacemaker interrogation shows that she is  ventricularly paced, permanent atrial fibrillation  In follow-up today, she continues to have problems with shortness of breath. She went to the emergency room yesterday for atypical chest pain. Cardiac enzymes were negative and she was discharged home She states that her breathing has become worse in the past year or so. Prior workup with echocardiogram in 2014 and stress test were unrevealing. She is taking Lasix daily. On her last clinic visit we suggested she increase her Lasix through the holiday period. She has not done this. Denies any leg edema, no abdominal swelling. No significant weight change, PND or orthopnea  Lab work reviewed with her from the hospital showing normal renal function,  Other past medical history  he reports having sleep study in October and again in November. Sleep study in October suggested she repeat the test as it was incomplete. The note indicates the study was discontinued as the patient had heart block, with limited EKG monitoring appearing to demonstrate a 43 second period of complete heart block with ventricular standstill. Atrial rate was 80 beats per minute. Note indicates the patient was awake and alert during the event with some dizziness. Pacer was checked after this event,  Normal functioning device.  Followup sleep study 04/24/2013 recommended weight loss, though there was no indications for CPAP at this time. Previous stress test  early 2014 showed no ischemia        Allergies  Allergen  Reactions  . Ivp Dye [Iodinated Diagnostic Agents] Shortness Of Breath  . Codeine   . Iodine   . Morphine   . Oxycodone-Acetaminophen   . Persantine [Dipyridamole]   . Statins   . Sulfonamide Derivatives     Outpatient Encounter Prescriptions as of 06/12/2014  Medication Sig  . Alum & Mag Hydroxide-Simeth (DI-GEL PO) Take by mouth as needed.  Marland Kitchen apixaban (ELIQUIS) 5 MG TABS tablet Take 1 tablet (5 mg total) by mouth 2 (two) times daily.  . diphenoxylate-atropine (LOMOTIL) 2.5-0.025 MG per tablet Take 1 tablet by mouth 4 (four) times daily as needed.    . furosemide (LASIX) 40 MG tablet Take 1 tablet (40 mg total) by mouth 2 (two) times daily.  Marland Kitchen levothyroxine (SYNTHROID, LEVOTHROID) 112 MCG tablet Take 112 mcg by mouth daily before breakfast.  . lisinopril (PRINIVIL,ZESTRIL) 10 MG tablet Take 1 tablet (10 mg total) by mouth daily.  . metoprolol (LOPRESSOR) 50 MG tablet TAKE ONE AND ONE-HALF TABLETS BY MOUTH TWICE DAILY   . nitroGLYCERIN (NITROSTAT) 0.4 MG SL tablet Place 1 tablet (0.4 mg total) under the tongue every 5 (five) minutes as needed for chest pain.  . pantoprazole (PROTONIX) 40 MG tablet Take 40 mg by mouth daily.  . potassium chloride (K-DUR) 10 MEQ tablet Take 1 tablet (10 mEq total) by mouth daily.  . prochlorperazine (COMPAZINE) 5 MG tablet Take 1 tablet (5 mg total) by mouth every 6 (six) hours as needed.  Marland Kitchen VITAMIN B1-B12 IM Inject into the muscle every 30 (thirty) days.  Marland Kitchen VITAMIN D, CHOLECALCIFEROL, PO Take 5,000 Units by mouth 3 (three) times a week.  . meclizine (ANTIVERT)  25 MG tablet Take 1 tablet (25 mg total) by mouth 3 (three) times daily as needed.  . [DISCONTINUED] budesonide-formoterol (SYMBICORT) 160-4.5 MCG/ACT inhaler Inhale 2 puffs into the lungs 2 (two) times daily as needed.  . [DISCONTINUED] famotidine (PEPCID) 20 MG tablet Take 20 mg by mouth 2 (two) times daily.    Past Medical History  Diagnosis Date  . Hypertension   . Hypothyroidism   . GERD  (gastroesophageal reflux disease)   . Migraine   . OSA (obstructive sleep apnea)     Not consistently using CPAP  . Allergic to IV contrast   . PAD (peripheral artery disease)     Occlusive disease involving left PT and AT  . Hyperlipidemia     Has been unable to take statins due to muscle pain. Has tried multiple statins per her report.  . Abnormal echocardiogram 09/2009    EF >55%, mild LVH, moderate LAE, normal RV, normal PA systolic pressure  . Paroxysmal atrial fibrillation     Breakthrough a fib on sotalol, changed to amiodarone 4/11. DCCV 4/11  . Chronic diastolic CHF (congestive heart failure)   . Pacemaker-Medtronic CRT P. 11/23/2010    Atrial port was plugged   . Complete heart block-S./P. AV junction ablation 11/23/2010    Past Surgical History  Procedure Laterality Date  . Cardiac catheterization      Left heart cath x3 in teh past, last 5 yrs ago. All "normal" per pt report  . Pacemaker insertion  06/12/2010  . Atrial ablation surgery  06/12/2010  . Appendectomy    . Cholecystectomy open    . Vesicovaginal fistula closure w/ tah    . Carpal tunnel release    . Cardioversion      Social History  reports that she has never smoked. She has never used smokeless tobacco. She reports that she does not drink alcohol or use illicit drugs.  Family History family history includes Colon cancer in her daughter; Heart attack in her father and mother.  Review of Systems  Constitutional: Negative.   HENT: Negative.   Eyes: Negative.   Respiratory: Positive for shortness of breath.   Cardiovascular: Negative.   Gastrointestinal: Negative.   Endocrine: Negative.   Musculoskeletal: Negative.   Skin: Negative.   Allergic/Immunologic: Negative.   Neurological: Negative.   Hematological: Negative.   Psychiatric/Behavioral: Negative.   All other systems reviewed and are negative.   BP 120/60 mmHg  Pulse 80  Ht 5\' 2"  (1.575 m)  Wt 209 lb 12 oz (95.142 kg)  BMI 38.35  kg/m2  SpO2 99%  Physical Exam  Constitutional: She is oriented to person, place, and time. She appears well-developed and well-nourished.  Obese  HENT:  Head: Normocephalic.  Nose: Nose normal.  Mouth/Throat: Oropharynx is clear and moist.  Eyes: Conjunctivae are normal. Pupils are equal, round, and reactive to light.  Neck: Normal range of motion. Neck supple. No JVD present.  Cardiovascular: Normal rate, regular rhythm, S1 normal, S2 normal, normal heart sounds and intact distal pulses.  Exam reveals no gallop and no friction rub.   No murmur heard. Pulmonary/Chest: Effort normal and breath sounds normal. No respiratory distress. She has no wheezes. She has no rales. She exhibits no tenderness.  Abdominal: Soft. Bowel sounds are normal. She exhibits no distension. There is no tenderness.  Musculoskeletal: Normal range of motion. She exhibits no edema or tenderness.  Lymphadenopathy:    She has no cervical adenopathy.  Neurological: She is alert and oriented  to person, place, and time. Coordination normal.  Skin: Skin is warm and dry. No rash noted. No erythema.  Psychiatric: She has a normal mood and affect. Her behavior is normal. Judgment and thought content normal.    Assessment and Plan  Nursing note and vitals reviewed.

## 2014-06-12 NOTE — Assessment & Plan Note (Signed)
Worsening shortness of breath since her AV node ablation. We'll refer back to EP for pacemaker evaluation, possible adjustment given her chronic shortness of breath

## 2014-06-12 NOTE — Assessment & Plan Note (Signed)
Encouraged her to stay on her Lasix, in fact increase the dose up to 40 mg in the morning and 20 mg in the afternoon given her shortness of breath

## 2014-06-12 NOTE — Patient Instructions (Signed)
We will schedule a visit with Jacqueline Lozano for pacer check  Please take meclizine as needed for "dippy", spinning, vertigo  Please call us if you have new issues that need to be addressed before your next appt.  Your physician wants you to follow-up in: 6 months.  You will receive a reminder letter in the mail two months in advance. If you don't receive a letter, please call our office to schedule the follow-up appointment.

## 2014-06-12 NOTE — Assessment & Plan Note (Signed)
Permanent atrial fibrillation, on anticoagulation

## 2014-06-12 NOTE — Assessment & Plan Note (Signed)
Blood pressure is well controlled on today's visit. No changes made to the medications. 

## 2014-06-12 NOTE — Assessment & Plan Note (Signed)
Etiology of her shortness of breath is concerning for a pacemaker setting.  She did have an appointment yesterday with Nevin Bloodgood but was in the hospital and missed the appointment. We will reschedule her for next week.

## 2014-06-12 NOTE — Assessment & Plan Note (Signed)
Recent admission to the hospital yesterday for atypical chest pain. Likely noncardiac. No further testing at this time.

## 2014-06-18 ENCOUNTER — Other Ambulatory Visit: Payer: Self-pay | Admitting: Cardiovascular Disease

## 2014-06-21 ENCOUNTER — Ambulatory Visit (INDEPENDENT_AMBULATORY_CARE_PROVIDER_SITE_OTHER): Payer: Medicare Other | Admitting: *Deleted

## 2014-06-21 DIAGNOSIS — I5032 Chronic diastolic (congestive) heart failure: Secondary | ICD-10-CM

## 2014-06-21 DIAGNOSIS — I442 Atrioventricular block, complete: Secondary | ICD-10-CM

## 2014-06-21 LAB — MDC_IDC_ENUM_SESS_TYPE_INCLINIC
Battery Remaining Longevity: 35 mo
Battery Voltage: 2.97 V
Brady Statistic AP VS Percent: 0 %
Brady Statistic AS VS Percent: 1.36 %
Lead Channel Impedance Value: 4047 Ohm
Lead Channel Impedance Value: 4047 Ohm
Lead Channel Impedance Value: 513 Ohm
Lead Channel Impedance Value: 627 Ohm
Lead Channel Impedance Value: 722 Ohm
Lead Channel Impedance Value: 760 Ohm
Lead Channel Pacing Threshold Amplitude: 3 V
Lead Channel Pacing Threshold Pulse Width: 0.4 ms
Lead Channel Setting Pacing Pulse Width: 0.4 ms
Lead Channel Setting Pacing Pulse Width: 1.5 ms
MDC IDC MSMT LEADCHNL LV IMPEDANCE VALUE: 1178 Ohm
MDC IDC MSMT LEADCHNL LV IMPEDANCE VALUE: 855 Ohm
MDC IDC MSMT LEADCHNL LV PACING THRESHOLD PULSEWIDTH: 1.5 ms
MDC IDC MSMT LEADCHNL RV IMPEDANCE VALUE: 456 Ohm
MDC IDC MSMT LEADCHNL RV PACING THRESHOLD AMPLITUDE: 0.75 V
MDC IDC SESS DTM: 20160115120023
MDC IDC SET LEADCHNL LV PACING AMPLITUDE: 3.5 V
MDC IDC SET LEADCHNL RV PACING AMPLITUDE: 2 V
MDC IDC SET LEADCHNL RV SENSING SENSITIVITY: 0.9 mV
MDC IDC STAT BRADY AP VP PERCENT: 0 %
MDC IDC STAT BRADY AS VP PERCENT: 98.64 %
MDC IDC STAT BRADY RA PERCENT PACED: 0 %
MDC IDC STAT BRADY RV PERCENT PACED: 98.64 %
Zone Setting Detection Interval: 150 ms
Zone Setting Detection Interval: 400 ms

## 2014-06-21 NOTE — Progress Notes (Signed)
Bi V PPM check in office.

## 2014-06-24 ENCOUNTER — Telehealth: Payer: Self-pay

## 2014-06-24 NOTE — Telephone Encounter (Signed)
Patient scheduled to see Dr. Caryl Comes 06/25/14 in the [place] office for a device check.

## 2014-06-24 NOTE — Telephone Encounter (Signed)
Pt called, states Nevin Bloodgood "adjusted" her pacemaker on Friday, states she is still have SOB, and her heart "quivers". States she needs her pacemaker adjusted again. Please advise.

## 2014-06-25 ENCOUNTER — Other Ambulatory Visit: Payer: Self-pay | Admitting: Cardiovascular Disease

## 2014-06-25 ENCOUNTER — Encounter: Payer: Self-pay | Admitting: Internal Medicine

## 2014-06-25 ENCOUNTER — Ambulatory Visit (INDEPENDENT_AMBULATORY_CARE_PROVIDER_SITE_OTHER): Payer: Medicare Other | Admitting: Internal Medicine

## 2014-06-25 VITALS — BP 157/103 | HR 79 | Ht 62.0 in | Wt 208.2 lb

## 2014-06-25 DIAGNOSIS — I442 Atrioventricular block, complete: Secondary | ICD-10-CM

## 2014-06-25 DIAGNOSIS — I5032 Chronic diastolic (congestive) heart failure: Secondary | ICD-10-CM

## 2014-06-25 LAB — MDC_IDC_ENUM_SESS_TYPE_INCLINIC
Battery Voltage: 2.95 V
Brady Statistic AP VS Percent: 0 %
Brady Statistic AS VP Percent: 100 %
Brady Statistic AS VS Percent: 0 %
Brady Statistic RA Percent Paced: 0 %
Brady Statistic RV Percent Paced: 100 %
Lead Channel Impedance Value: 4047 Ohm
Lead Channel Impedance Value: 418 Ohm
Lead Channel Impedance Value: 589 Ohm
Lead Channel Pacing Threshold Amplitude: 0.875 V
Lead Channel Pacing Threshold Amplitude: 2.75 V
Lead Channel Pacing Threshold Pulse Width: 0.4 ms
Lead Channel Pacing Threshold Pulse Width: 1.5 ms
Lead Channel Sensing Intrinsic Amplitude: 5.125 mV
Lead Channel Setting Pacing Amplitude: 2 V
Lead Channel Setting Pacing Amplitude: 4 V
Lead Channel Setting Pacing Pulse Width: 0.4 ms
Lead Channel Setting Pacing Pulse Width: 1 ms
Lead Channel Setting Sensing Sensitivity: 0.9 mV
MDC IDC MSMT BATTERY REMAINING LONGEVITY: 35 mo
MDC IDC MSMT LEADCHNL LV IMPEDANCE VALUE: 1102 Ohm
MDC IDC MSMT LEADCHNL LV IMPEDANCE VALUE: 684 Ohm
MDC IDC MSMT LEADCHNL LV IMPEDANCE VALUE: 703 Ohm
MDC IDC MSMT LEADCHNL LV IMPEDANCE VALUE: 817 Ohm
MDC IDC MSMT LEADCHNL RA IMPEDANCE VALUE: 4047 Ohm
MDC IDC MSMT LEADCHNL RV IMPEDANCE VALUE: 475 Ohm
MDC IDC MSMT LEADCHNL RV SENSING INTR AMPL: 5.125 mV
MDC IDC SESS DTM: 20160119114037
MDC IDC SET ZONE DETECTION INTERVAL: 150 ms
MDC IDC STAT BRADY AP VP PERCENT: 0 %
Zone Setting Detection Interval: 400 ms

## 2014-06-25 MED ORDER — AMLODIPINE BESYLATE 5 MG PO TABS: 5.0000 mg | ORAL_TABLET | Freq: Every day | ORAL | Status: DC

## 2014-06-25 NOTE — Progress Notes (Signed)
Patient Care Team: [name], MD as PCP - General (Family Medicine)   HPI  Jacqueline Lozano is a 80 y.o. female seen in followup for pacemaker implantation for tachybradycardia syndrome. She has a history of permanent atrial fibrillation and status post CRT P. she is status post AV junction ablation Comes in today not with any specific cardiac complaints she has chronic shortness of breath. She says it is worse than it has been although I don't particularly see it. She was seen about 2 weeks ago with a similar complaint and was noted that her LV lead was failing to capture. Titration of voltage was associated with diaphragmatic stimulation. She denies edema or abdominal distention.  She underwent sleep study this fall. They were interrupted because of "complete heart block/asystole" it lasted for 40 seconds plus. For sleep studies were unrevealing.   Echo 314 demonstrated ejection fraction of 55-60% with mild LVH.  Past Medical History  Diagnosis Date  . Hypertension   . Hypothyroidism   . GERD (gastroesophageal reflux disease)   . Migraine   . OSA (obstructive sleep apnea)     Not consistently using CPAP  . Allergic to IV contrast   . PAD (peripheral artery disease)     Occlusive disease involving left PT and AT  . Hyperlipidemia     Has been unable to take statins due to muscle pain. Has tried multiple statins per her report.  . Abnormal echocardiogram 09/2009    EF >55%, mild LVH, moderate LAE, normal RV, normal PA systolic pressure  . Paroxysmal atrial fibrillation     Breakthrough a fib on sotalol, changed to amiodarone 4/11. DCCV 4/11  . Chronic diastolic CHF (congestive heart failure)   . Pacemaker-Medtronic CRT P. 11/23/2010    Atrial port was plugged   . Complete heart block-S./P. AV junction ablation 11/23/2010    Past Surgical History  Procedure Laterality Date  . Cardiac catheterization      Left heart cath x3 in teh past, last 5 yrs ago. All "normal"  per pt report  . Pacemaker insertion  06/12/2010  . Atrial ablation surgery  06/12/2010  . Appendectomy    . Cholecystectomy open    . Vesicovaginal fistula closure w/ tah    . Carpal tunnel release    . Cardioversion      Current Outpatient Prescriptions  Medication Sig Dispense Refill  . Alum & Mag Hydroxide-Simeth (DI-GEL PO) Take by mouth as needed.    . diphenoxylate-atropine (LOMOTIL) 2.5-0.025 MG per tablet Take 1 tablet by mouth 4 (four) times daily as needed.      Marland Kitchen ELIQUIS 5 MG TABS tablet TAKE ONE TABLET BY MOUTH TWICE DAILY 60 tablet 6  . furosemide (LASIX) 40 MG tablet Take 1 tablet (40 mg total) by mouth 2 (two) times daily. 180 tablet 3  . levothyroxine (SYNTHROID, LEVOTHROID) 112 MCG tablet Take 112 mcg by mouth daily before breakfast.    . lisinopril (PRINIVIL,ZESTRIL) 10 MG tablet Take 1 tablet (10 mg total) by mouth daily. 90 tablet 3  . meclizine (ANTIVERT) 25 MG tablet Take 1 tablet (25 mg total) by mouth 3 (three) times daily as needed. 90 tablet 1  . metoprolol (LOPRESSOR) 50 MG tablet TAKE ONE AND ONE-HALF TABLETS BY MOUTH TWICE DAILY  180 tablet 3  . nitroGLYCERIN (NITROSTAT) 0.4 MG SL tablet Place 1 tablet (0.4 mg total) under the tongue every 5 (five) minutes as needed for chest pain. 25 tablet 3  .  pantoprazole (PROTONIX) 40 MG tablet Take 40 mg by mouth daily.    . potassium chloride (K-DUR) 10 MEQ tablet Take 1 tablet (10 mEq total) by mouth daily. 90 tablet 3  . prochlorperazine (COMPAZINE) 5 MG tablet Take 1 tablet (5 mg total) by mouth every 6 (six) hours as needed. 90 tablet 5  . VITAMIN B1-B12 IM Inject into the muscle every 30 (thirty) days.    Marland Kitchen VITAMIN D, CHOLECALCIFEROL, PO Take 5,000 Units by mouth 3 (three) times a week.     No current facility-administered medications for this visit.    Allergies  Allergen Reactions  . Ivp Dye [Iodinated Diagnostic Agents] Shortness Of Breath  . Codeine   . Iodine   . Morphine   . Oxycodone-Acetaminophen     . Persantine [Dipyridamole]   . Statins   . Sulfonamide Derivatives     Review of Systems negative except from HPI and PMH  Physical Exam BP 157/103 mmHg  Pulse 79  Ht 5\' 2"  (1.575 m)  Wt 208 lb 4 oz (94.462 kg)  BMI 38.08 kg/m2 Well developed and well nourished in no acute distress HENT normal E scleral and icterus clear Neck Supple JVP flat; carotids brisk and full Clear to ausculation Device pocket well healed; without hematoma or erythema.  There is no tethering *Regular rate and rhythm, no murmurs gallops or rub Soft with active bowel sounds No clubbing cyanosis no Edema Alert and oriented, grossly normal motor and sensory function Skin Warm and Dry   ECG was ordered today demonstrated atrial fibrillation and biventricular pacing with a QRS morphology that is negative in lead V1 Assessment and  Plan  HFpEF  Atrial fibrillation-permanent  AV junction ablation-complete heart block  Pacemaker-CRT  Lead malfunction  Hypertension  Her blood pressure is quite high and this is likely aggravating her HFpEF. She is euvolemic. Add amlodipine 5 mg daily.  We have also reprogrammed her device hopefully assure LV capture. This will certainly have an impact on device longevity. For right now I will not pursue an echo as we could see vector shift with LV capture.  Her diuretics were recently increased. We will arrange follow-up in about 3 weeks to reassess volume status: I should note that her optivol index was stable.  In the past she has seen pulmonary; in the event that her dyspnea persists in follow-up recommended we consider pulmonary consultation

## 2014-06-25 NOTE — Patient Instructions (Addendum)
Your physician recommends that you schedule a follow-up appointment in:  3 weeks with Christell Faith PA   Your physician has recommended you make the following change in your medication:  Start Amlodipine 5 mg once daily

## 2014-07-09 ENCOUNTER — Encounter: Payer: Medicare Other | Admitting: Internal Medicine

## 2014-07-17 ENCOUNTER — Ambulatory Visit (INDEPENDENT_AMBULATORY_CARE_PROVIDER_SITE_OTHER): Payer: Medicare Other | Admitting: Physician Assistant

## 2014-07-17 ENCOUNTER — Encounter: Payer: Self-pay | Admitting: Physician Assistant

## 2014-07-17 VITALS — BP 186/74 | HR 72 | Ht 62.0 in | Wt 206.0 lb

## 2014-07-17 DIAGNOSIS — I442 Atrioventricular block, complete: Secondary | ICD-10-CM

## 2014-07-17 DIAGNOSIS — I10 Essential (primary) hypertension: Secondary | ICD-10-CM

## 2014-07-17 DIAGNOSIS — I4891 Unspecified atrial fibrillation: Secondary | ICD-10-CM

## 2014-07-17 DIAGNOSIS — Z950 Presence of cardiac pacemaker: Secondary | ICD-10-CM

## 2014-07-17 DIAGNOSIS — R0602 Shortness of breath: Secondary | ICD-10-CM

## 2014-07-17 DIAGNOSIS — Z7901 Long term (current) use of anticoagulants: Secondary | ICD-10-CM

## 2014-07-17 DIAGNOSIS — I5032 Chronic diastolic (congestive) heart failure: Secondary | ICD-10-CM

## 2014-07-17 MED ORDER — AMLODIPINE BESYLATE 10 MG PO TABS: 10.0000 mg | ORAL_TABLET | Freq: Every day | ORAL | Status: DC

## 2014-07-17 NOTE — Progress Notes (Signed)
Patient Name: Jacqueline Lozano, Jacqueline Lozano [birth year], MRN [id number]  Date of Encounter: 07/17/2014  Primary Care Provider:  [name], MD Primary Cardiologist:  Dr. Rockey Situ, MD Primary Electrophysiologist: Dr. Caryl Comes, MD   Chief Complaint  Patient presents with  . other    3 wk f/u c/o sob. Meds reviewed verbally with pt.    HPI:  80 year old female with history of atrial fibrillation status post arteriovenous node ablation, tachybrady syndrome s/p MDT pacer/CRT-P in 06/2010, poorly controlled hypertension, paroxysmal nocturnal dyspnea, hypothyroidism, diastolic congestive heart failure, C. difficile infection, and history of longstanding SOB who was recently admitted to [facility] on 06/11/2014 with a 1 day history of increased SOB and bilateral chest pain. During that hospitalization cardiac enzymes were negative and she was discharged home.   She has known intermittent SOB and is not able to walk great distances, requiring her to use wheel chairs and assistance when at shopping centers. Last echo in 2014 showed EF 55-60%, no regional WMA, mild MR, mildly dilated LA/RA, PASP 34 mm Hg. She has previously tried cardiac/pulmonary rehab but had to stop 2/2 worsening knee pain. Does not do any regular exercise. Weight continues to be a problem. Has attempted sleep study, however this was cut short 2/2 "CHB." Notes indicate a 43 second period of complete heart block with ventricular standstill. Atrial rate was 80 beats per minute. Note indicates the patient was awake and alert during the event with some dizziness. Device was evaluated and functioning properly. No further cardiac work up 2/2 pacer in place. It was advised that she increase her Lasix at her last outpatient office vist on 05/20/2014 secondary to her SOB and diastolic HF. Unfortunately, she did not do this as she did not want to be going to the bathroom over the holiday season while she had friends and family over. She last under went a [employer] 07/2012 that showed no significant ischemia with an EF of 65%.   In her hospital follow up on 06/12/2014 she continued to have SOB (this has been on going and becoming worse over the past year or so). Her Lasix was increased to 40 mg in the AM and 20 mg in the PM. Her device was interrogated on 06/21/2014 which showed normal device function. LV threshold increased and output was reprogrammed. She reports to me this helped with her SOB for 30 minutes until she got home then her SOB returned. She followed up with Dr. Caryl Comes on 06/25/2014 with continued SOB. Her device was again reprogrammed. She was started on amlodipine for her HTN. She was found to be euvolemic and her Lasix was decreased at that time back to 20 mg. She reports to me today that she does not take them on a daily basis, only on a prn basis. She reports her SOB did improve again for about 30 minutes, then when she got home she was SOB again. Her weight is stable. No orthopnea. No lower extremity edema. No early satiety. She is watching her salt and fluid intake. No further chest pain.    Past Medical History  Diagnosis Date  . Hypertension   . Hypothyroidism   . GERD (gastroesophageal reflux disease)   . Migraine   . OSA (obstructive sleep apnea)     Not consistently using CPAP  . Allergic to IV contrast   . PAD (peripheral artery disease)     Occlusive disease involving left PT and AT  . Hyperlipidemia  Has been unable to take statins due to muscle pain. Has tried multiple statins per her report.  . Abnormal echocardiogram 09/2009    EF >55%, mild LVH, moderate LAE, normal RV, normal PA systolic pressure  . Paroxysmal atrial fibrillation     Breakthrough a fib on sotalol, changed to amiodarone 4/11. DCCV 4/11  . Chronic diastolic CHF (congestive heart failure)   . Pacemaker-Medtronic CRT P. 11/23/2010    Atrial port was plugged   . Complete heart block-S./P. AV junction ablation 11/23/2010  :  Past Surgical History    Procedure Laterality Date  . Cardiac catheterization      Left heart cath x3 in teh past, last 5 yrs ago. All "normal" per pt report  . Pacemaker insertion  06/12/2010  . Atrial ablation surgery  06/12/2010  . Appendectomy    . Cholecystectomy open    . Vesicovaginal fistula closure w/ tah    . Carpal tunnel release    . Cardioversion    :  Social History:  The patient  reports that she has never smoked. She has never used smokeless tobacco. She reports that she does not drink alcohol or use illicit drugs.   Family History  Problem Relation Age of Onset  . Heart attack Mother     MI x2  . Heart attack Father     MI  . Colon cancer Daughter      Allergies:  Allergies  Allergen Reactions  . Ivp Dye [Iodinated Diagnostic Agents] Shortness Of Breath  . Codeine   . Iodine   . Morphine   . Oxycodone-Acetaminophen   . Persantine [Dipyridamole]   . Statins   . Sulfonamide Derivatives      Home Medications:  Current Outpatient Prescriptions  Medication Sig Dispense Refill  . ELIQUIS 5 MG TABS tablet TAKE ONE TABLET BY MOUTH TWICE DAILY 60 tablet 6  . famotidine (PEPCID) 10 MG tablet Take 10 mg by mouth daily.    . furosemide (LASIX) 40 MG tablet Take 1 tablet (40 mg total) by mouth 2 (two) times daily. (Patient taking differently: Take 20 mg by mouth daily. ) 180 tablet 3  . levothyroxine (SYNTHROID, LEVOTHROID) 112 MCG tablet Take 112 mcg by mouth daily before breakfast.    . lisinopril (PRINIVIL,ZESTRIL) 10 MG tablet Take 1 tablet (10 mg total) by mouth daily. 90 tablet 3  . meclizine (ANTIVERT) 25 MG tablet Take 1 tablet (25 mg total) by mouth 3 (three) times daily as needed. 90 tablet 1  . nitroGLYCERIN (NITROSTAT) 0.4 MG SL tablet Place 1 tablet (0.4 mg total) under the tongue every 5 (five) minutes as needed for chest pain. 25 tablet 3  . potassium chloride (K-DUR,KLOR-CON) 10 MEQ tablet TAKE ONE TABLET BY MOUTH ONE TIME DAILY  90 tablet 3  . prochlorperazine  (COMPAZINE) 5 MG tablet Take 1 tablet (5 mg total) by mouth every 6 (six) hours as needed. 90 tablet 5  . VITAMIN B1-B12 IM Inject into the muscle every 30 (thirty) days.    Marland Kitchen VITAMIN D, CHOLECALCIFEROL, PO Take 5,000 Units by mouth 3 (three) times a week.    Marland Kitchen amLODipine (NORVASC) 10 MG tablet Take 1 tablet (10 mg total) by mouth daily. 30 tablet 6   No current facility-administered medications for this visit.     Weights: Wt Readings from Last 3 Encounters:  07/17/14 206 lb (93.441 kg)  06/25/14 208 lb 4 oz (94.462 kg)  06/12/14 209 lb 12 oz (95.142 kg)  Review of Systems:  As above.  All other systems reviewed and are otherwise negative except as noted above.  Physical Exam:  Blood pressure 186/74, pulse 72, height 5\' 2"  (1.575 m), weight 206 lb (93.441 kg), SpO2 98 %.  General: Pleasant, NAD Psych: Normal affect. Neuro: Alert and oriented X 3. Moves all extremities spontaneously. HEENT: Normal  Neck: Supple without bruits or JVD. Lungs:  Resp regular and unlabored, CTA. Heart: RRR no s3, s4, or murmurs. Abdomen: Soft, non-tender, non-distended, BS + x 4.  Extremities: No clubbing, cyanosis or edema.    Accessory Clinical Findings:  EKG - V paced, 71 bpm   Other studies Reviewed: Additional studies/ records that were reviewed today include: [facility] records and studies.    Lipid Panel    Component Value Date/Time   CHOL 294* 09/30/2009 2119   TRIG 459* 09/30/2009 2119   HDL 44 09/30/2009 2119   CHOLHDL 6.7 Ratio 09/30/2009 2119   VLDL NOT CALC mg/dL 09/30/2009 2119   [place] See Comment mg/dL 09/30/2009 2119   LDLDIRECT 146* 10/01/2009 0851    Assessment & Plan:  1. SOB: -This is a longstanding issue that continues to be her major complaint, though objectively it does not present itself.  -Likely multifactorial including deconditioning, obesity, question component of diastolic CHF, and medication noncompliance in the past  -Check echo -She reports her  Lasix was decreased back to 20 mg daily on 1/19, however I cannot find record of this being done. I only see that her Lasix were increased at her hospital follow up on 06/12/14 -PPM thresholds recently changed x 2 by EP  2. Chronic diastolic CHF: -Check echo -Appears euvolemic today -Advise that she take her Lasix daily as directed  (40 mg QAM and 20 mg QPM) - bmet demonstrated stable SCr of 1.25 (1.23 at discharge from [facility] on 06/11/14)  3. Permanent a-fib: -Continue Eliquis 5 mg bid  4. History of complete heart block: -Followed by EP  5. HTN: -Uncontrolled -Discussed the importance of well controlled BP and slowing the progression of her HF -She would only let me increase Norvasc today, unable to add medication, increase Norvasc to 10 mg  Dispo: -Follow up 1 month   Christell Faith, PA-C [place], [place] [address] [phone] [place] 07/17/2014, 5:04 PM

## 2014-07-17 NOTE — Patient Instructions (Signed)
Your physician has recommended you make the following change in your medication:  Increase Norvasc to 10 mg once daily   Your physician recommends that you have labs today:  BMP  Your physician has requested that you have an echocardiogram. Echocardiography is a painless test that uses sound waves to create images of your heart. It provides your doctor with information about the size and shape of your heart and how well your heart's chambers and valves are working. This procedure takes approximately one hour. There are no restrictions for this procedure.  Your physician recommends that you schedule a follow-up appointment in:  1 month

## 2014-07-18 LAB — BASIC METABOLIC PANEL
BUN/Creatinine Ratio: 12 (ref 11–26)
BUN: 15 mg/dL (ref 8–27)
CALCIUM: 9.5 mg/dL (ref 8.7–10.3)
CO2: 19 mmol/L (ref 18–29)
Chloride: 105 mmol/L (ref 97–108)
Creatinine, Ser: 1.25 mg/dL — ABNORMAL HIGH (ref 0.57–1.00)
GFR calc Af Amer: 48 mL/min/{1.73_m2} — ABNORMAL LOW (ref >59)
GFR calc non Af Amer: 42 mL/min/{1.73_m2} — ABNORMAL LOW (ref >59)
GLUCOSE: 112 mg/dL — AB (ref 65–99)
Potassium: 5 mmol/L (ref 3.5–5.2)
SODIUM: 140 mmol/L (ref 134–144)

## 2014-08-05 ENCOUNTER — Other Ambulatory Visit: Payer: Self-pay

## 2014-08-05 ENCOUNTER — Other Ambulatory Visit (INDEPENDENT_AMBULATORY_CARE_PROVIDER_SITE_OTHER): Payer: Medicare Other

## 2014-08-05 ENCOUNTER — Telehealth: Payer: Self-pay

## 2014-08-05 DIAGNOSIS — R0602 Shortness of breath: Secondary | ICD-10-CM | POA: Diagnosis not present

## 2014-08-05 DIAGNOSIS — I4891 Unspecified atrial fibrillation: Secondary | ICD-10-CM | POA: Diagnosis not present

## 2014-08-05 DIAGNOSIS — I5032 Chronic diastolic (congestive) heart failure: Secondary | ICD-10-CM

## 2014-08-05 NOTE — Telephone Encounter (Signed)
Pt came in for echocardiogram, asks if she can stop the Amlodipine, states she is still breaking out on her face, hives, itching, SOB. Please call.

## 2014-08-05 NOTE — Telephone Encounter (Signed)
Patient called and stated she thinks the Norvasc is causing  Hives, itching, shaking, and worsening of her SOB  These symptoms started 2 weeks after starting the Norvasc  She would like to stop this medication  Her blood pressure continues to be regularly elated   Patient already took Norvasc today  Instructed her not to talk in until we can discuss with Dr. Rockey Situ and call back instructions  Instructed her to call EMS if her situation becomes emergent  Patient verbalized understanding

## 2014-08-06 ENCOUNTER — Encounter: Payer: Medicare Other | Admitting: Internal Medicine

## 2014-08-06 NOTE — Telephone Encounter (Signed)
Spoke w/ pt.  Advised her of Dr. Donivan Scull recommendation.  She is not agreeable to holding the Norvasc w/o instructions for an alternate med right now.  Per Izora Gala, pt sched to see Dr. Caryl Comes tomorrow at 3:45 to discuss.

## 2014-08-06 NOTE — Telephone Encounter (Signed)
Started by Dr. Caryl Comes at 5 mg Increased by Thurmond Butts up to 10 mg Would hold for now, monitor blood pressure If blood pressure runs high, ALT and a blood pressure medication could be used

## 2014-08-07 ENCOUNTER — Encounter: Payer: Self-pay | Admitting: Internal Medicine

## 2014-08-07 ENCOUNTER — Ambulatory Visit (INDEPENDENT_AMBULATORY_CARE_PROVIDER_SITE_OTHER): Payer: Medicare Other | Admitting: Internal Medicine

## 2014-08-07 VITALS — BP 140/64 | HR 88 | Ht 62.0 in | Wt 206.0 lb

## 2014-08-07 DIAGNOSIS — T50905A Adverse effect of unspecified drugs, medicaments and biological substances, initial encounter: Secondary | ICD-10-CM

## 2014-08-07 DIAGNOSIS — T887XXA Unspecified adverse effect of drug or medicament, initial encounter: Secondary | ICD-10-CM

## 2014-08-07 DIAGNOSIS — I10 Essential (primary) hypertension: Secondary | ICD-10-CM

## 2014-08-07 NOTE — Patient Instructions (Signed)
Your physician recommends that you schedule a follow-up appointment in:  Keep follow up with Dr. Rockey Situ

## 2014-08-07 NOTE — [facility] Notes (Signed)
Patient Care Team: [name], MD as PCP - General (Family Medicine)   HPI  Jacqueline Lozano is a 80 y.o. female seen in followup for pacemaker implantation for tachybradycardia syndrome. She has a history of permanent atrial fibrillation and status post CRT P. she is status post AV junction ablation She has chronic shortness of breath. She says it is worse than it has been although I don't particularly see it.   She was seen about 2 weeks ago with a similar complaint and was noted that her LV lead was failing to capture. And when she was seen in early January it was reactivated. Her concern about longevity of the device as well as  diaphragmatic stimulation.   She also had significant hypertension and amlodipine was added.  She comes in today with complaints of a rash worsening of her tremor which she ascribes to the amlodipine. She stopped it a couple of days ago   She underwent sleep study this fall. They were interrupted because of "complete heart block/asystole" it lasted for 40 seconds plus. For sleep studies were unrevealing.   Echo 314 demonstrated ejection fraction of 55-60% with mild LVH.  Past Medical History  Diagnosis Date  . Hypertension   . Hypothyroidism   . GERD (gastroesophageal reflux disease)   . Migraine   . OSA (obstructive sleep apnea)     Not consistently using CPAP  . Allergic to IV contrast   . PAD (peripheral artery disease)     Occlusive disease involving left PT and AT  . Hyperlipidemia     Has been unable to take statins due to muscle pain. Has tried multiple statins per her report.  . Abnormal echocardiogram 09/2009    EF >55%, mild LVH, moderate LAE, normal RV, normal PA systolic pressure  . Paroxysmal atrial fibrillation     Breakthrough a fib on sotalol, changed to amiodarone 4/11. DCCV 4/11  . Chronic diastolic CHF (congestive heart failure)   . Pacemaker-Medtronic CRT P. 11/23/2010    Atrial port was plugged   . Complete heart  block-S./P. AV junction ablation 11/23/2010    Past Surgical History  Procedure Laterality Date  . Cardiac catheterization      Left heart cath x3 in teh past, last 5 yrs ago. All "normal" per pt report  . Pacemaker insertion  06/12/2010  . Atrial ablation surgery  06/12/2010  . Appendectomy    . Cholecystectomy open    . Vesicovaginal fistula closure w/ tah    . Carpal tunnel release    . Cardioversion      Current Outpatient Prescriptions  Medication Sig Dispense Refill  . ELIQUIS 5 MG TABS tablet TAKE ONE TABLET BY MOUTH TWICE DAILY 60 tablet 6  . famotidine (PEPCID) 10 MG tablet Take 10 mg by mouth daily.    . furosemide (LASIX) 40 MG tablet Take 1 tablet (40 mg total) by mouth 2 (two) times daily. (Patient taking differently: Take 20 mg by mouth daily. ) 180 tablet 3  . levothyroxine (SYNTHROID, LEVOTHROID) 112 MCG tablet Take 112 mcg by mouth daily before breakfast.    . lisinopril (PRINIVIL,ZESTRIL) 10 MG tablet Take 1 tablet (10 mg total) by mouth daily. 90 tablet 3  . meclizine (ANTIVERT) 25 MG tablet Take 1 tablet (25 mg total) by mouth 3 (three) times daily as needed. 90 tablet 1  . nitroGLYCERIN (NITROSTAT) 0.4 MG SL tablet Place 1 tablet (0.4 mg total) under the tongue every 5 (  five) minutes as needed for chest pain. 25 tablet 3  . potassium chloride (K-DUR,KLOR-CON) 10 MEQ tablet TAKE ONE TABLET BY MOUTH ONE TIME DAILY  90 tablet 3  . prochlorperazine (COMPAZINE) 5 MG tablet Take 1 tablet (5 mg total) by mouth every 6 (six) hours as needed. 90 tablet 5  . VITAMIN B1-B12 IM Inject into the muscle every 30 (thirty) days.    Marland Kitchen VITAMIN D, CHOLECALCIFEROL, PO Take 5,000 Units by mouth 3 (three) times a week.    Marland Kitchen amLODipine (NORVASC) 10 MG tablet Take 1 tablet (10 mg total) by mouth daily. (Patient not taking: Reported on 08/07/2014) 30 tablet 6   No current facility-administered medications for this visit.    Allergies  Allergen Reactions  . Ivp Dye [Iodinated Diagnostic  Agents] Shortness Of Breath  . Codeine   . Iodine   . Morphine   . Oxycodone-Acetaminophen   . Persantine [Dipyridamole]   . Statins   . Sulfonamide Derivatives     Review of Systems negative except from HPI and PMH  Physical Exam BP 140/64 mmHg  Pulse 88  Ht 5\' 2"  (1.575 m)  Wt 206 lb (93.441 kg)  BMI 37.67 kg/m2 Well developed and well nourished in no acute distress HENT normal E scleral and icterus clear Neck Supple JVP flat; carotids brisk and full Clear to ausculation Device pocket well healed; without hematoma or erythema.  There is no tethering *Regular rate and rhythm, no murmurs gallops or rub Soft with active bowel sounds No clubbing cyanosis no Edema Alert and oriented, grossly normal motor and sensory function Skin Warm and Dry erythematous facial rash   ECG was ordered today demonstrated atrial fibrillation and biventricular pacing with a QRS morphology that is negative in lead V1 Assessment and  Plan  HFpEF  Atrial fibrillation-permanent  rash  AV junction ablation-complete heart block  Pacemaker-CRT   Hypertension  Her blood pressure is is not too bad; she did not tolerate amlodipine. We will added to her drug intolerances. She would like to go back on her beta blocker so we'll start that back at 50 mg twice daily. She is NOT on lisinopril ; this was prescribed in July but never consummated. She is to see Dr. Deidre Ala next week. Potentially could be reinitiated if necessary.   She is euvolemic. We will continue her on her current dose of diuretics.

## 2014-08-15 ENCOUNTER — Encounter: Payer: Self-pay | Admitting: Cardiovascular Disease

## 2014-08-15 ENCOUNTER — Ambulatory Visit (INDEPENDENT_AMBULATORY_CARE_PROVIDER_SITE_OTHER): Payer: Medicare Other | Admitting: Cardiovascular Disease

## 2014-08-15 VITALS — BP 130/70 | HR 72 | Ht 62.0 in | Wt 213.0 lb

## 2014-08-15 DIAGNOSIS — R0602 Shortness of breath: Secondary | ICD-10-CM

## 2014-08-15 DIAGNOSIS — I10 Essential (primary) hypertension: Secondary | ICD-10-CM

## 2014-08-15 DIAGNOSIS — I5032 Chronic diastolic (congestive) heart failure: Secondary | ICD-10-CM

## 2014-08-15 DIAGNOSIS — E785 Hyperlipidemia, unspecified: Secondary | ICD-10-CM

## 2014-08-15 DIAGNOSIS — I4891 Unspecified atrial fibrillation: Secondary | ICD-10-CM

## 2014-08-15 NOTE — Assessment & Plan Note (Signed)
Permanent atrial fibrillation, on anticoagulation

## 2014-08-15 NOTE — Assessment & Plan Note (Signed)
Blood pressure is well controlled on today's visit. No changes made to the medications. 

## 2014-08-15 NOTE — Assessment & Plan Note (Signed)
No recent lipid panel available. We will discuss this with her in follow-up None available through [facility], [facility]

## 2014-08-15 NOTE — [facility] Notes (Signed)
Patient ID: Jacqueline Lozano, female    DOB: 11-Oct-1934, 80 y.o.   MRN: [id number]  HPI Comments: Jacqueline Lozano is a pleasant 80 -year-old woman with morbid obesity, a history of atrial fibrillation, status post arteriovenous node ablation, Pacer/CRT-P, Hypertension, Paroxysmal nocturnal dyspnea, Hypothyroidism, Diastolic congestive heart failure, C. difficile infection, who presents for followup of her shortness of breath. Previous pacemaker interrogation shows that she is  ventricularly paced, permanent atrial fibrillation Recently seen by Dr. Caryl Comes   she continues to have problems with shortness of breath. She reports that she is unable to participate in pulmonary rehabilitation secondary to chronic knee pain, osteoarthritis. Exercise will make her symptoms worse. She does not have immediate access to a swimming pool to do water aerobics.  Weight and conditioning is a major issue. She denies any lower extremity edema, no PND or orthopnea. She takes Lasix periodically  Recent echocardiogram shows normal LV function, stable valve disease, normal right ventricular systolic pressures.  Other past medical history Previous visit to the emergency room for atypical chest pain. Cardiac enzymes were negative and she was discharged home Prior workup with echocardiogram in 2014 and stress test were unrevealing.  he reports having sleep study in October and again in November. Sleep study in October suggested she repeat the test as it was incomplete. The note indicates the study was discontinued as the patient had heart block, with limited EKG monitoring appearing to demonstrate a 43 second period of complete heart block with ventricular standstill. Atrial rate was 80 beats per minute. Note indicates the patient was awake and alert during the event with some dizziness. Pacer was checked after this event,  Normal functioning device.  Followup sleep study 04/24/2013 recommended weight loss, though there was  no indications for CPAP at this time. Previous stress test  early 2014 showed no ischemia        Allergies  Allergen Reactions  . Ivp Dye [Iodinated Diagnostic Agents] Shortness Of Breath  . Codeine   . Iodine   . Morphine   . Oxycodone-Acetaminophen   . Persantine [Dipyridamole]   . Statins   . Sulfonamide Derivatives   . Amlodipine Rash    Outpatient Encounter Prescriptions as of 08/15/2014  Medication Sig  . ELIQUIS 5 MG TABS tablet TAKE ONE TABLET BY MOUTH TWICE DAILY  . famotidine (PEPCID) 10 MG tablet Take 10 mg by mouth daily.  . furosemide (LASIX) 40 MG tablet Take 1 tablet (40 mg total) by mouth 2 (two) times daily. (Patient taking differently: Take 20 mg by mouth daily. )  . levothyroxine (SYNTHROID, LEVOTHROID) 112 MCG tablet Take 112 mcg by mouth daily before breakfast.  . meclizine (ANTIVERT) 25 MG tablet Take 1 tablet (25 mg total) by mouth 3 (three) times daily as needed.  . metoprolol (LOPRESSOR) 50 MG tablet Take 300 mg by mouth daily. Take 1 1/2 tabs by mouth 2 times a daily.  . nitroGLYCERIN (NITROSTAT) 0.4 MG SL tablet Place 1 tablet (0.4 mg total) under the tongue every 5 (five) minutes as needed for chest pain.  . potassium chloride (K-DUR,KLOR-CON) 10 MEQ tablet TAKE ONE TABLET BY MOUTH ONE TIME DAILY   . prochlorperazine (COMPAZINE) 5 MG tablet Take 1 tablet (5 mg total) by mouth every 6 (six) hours as needed.  Marland Kitchen VITAMIN B1-B12 IM Inject into the muscle every 30 (thirty) days.  Marland Kitchen VITAMIN D, CHOLECALCIFEROL, PO Take 5,000 Units by mouth 3 (three) times a week.  . [DISCONTINUED] lisinopril (PRINIVIL,ZESTRIL) 10 MG  tablet Take 1 tablet (10 mg total) by mouth daily. (Patient not taking: Reported on 08/15/2014)    Past Medical History  Diagnosis Date  . Hypertension   . Hypothyroidism   . GERD (gastroesophageal reflux disease)   . Migraine   . OSA (obstructive sleep apnea)     Not consistently using CPAP  . Allergic to IV contrast   . PAD (peripheral  artery disease)     Occlusive disease involving left PT and AT  . Hyperlipidemia     Has been unable to take statins due to muscle pain. Has tried multiple statins per her report.  . Abnormal echocardiogram 09/2009    EF >55%, mild LVH, moderate LAE, normal RV, normal PA systolic pressure  . Paroxysmal atrial fibrillation     Breakthrough a fib on sotalol, changed to amiodarone 4/11. DCCV 4/11  . Chronic diastolic CHF (congestive heart failure)   . Pacemaker-Medtronic CRT P. 11/23/2010    Atrial port was plugged   . Complete heart block-S./P. AV junction ablation 11/23/2010    Past Surgical History  Procedure Laterality Date  . Cardiac catheterization      Left heart cath x3 in teh past, last 5 yrs ago. All "normal" per pt report  . Pacemaker insertion  06/12/2010  . Atrial ablation surgery  06/12/2010  . Appendectomy    . Cholecystectomy open    . Vesicovaginal fistula closure w/ tah    . Carpal tunnel release    . Cardioversion      Social History  reports that she has never smoked. She has never used smokeless tobacco. She reports that she does not drink alcohol or use illicit drugs.  Family History family history includes Colon cancer in her daughter; Heart attack in her father and mother. There is no history of Stroke.  Review of Systems  Constitutional: Negative.   Respiratory: Positive for shortness of breath.   Cardiovascular: Negative.   Gastrointestinal: Negative.   Musculoskeletal: Negative.   Skin: Negative.   Neurological: Negative.   Hematological: Negative.   Psychiatric/Behavioral: Negative.   All other systems reviewed and are negative.   BP 130/70 mmHg  Pulse 72  Ht 5\' 2"  (1.575 m)  Wt 213 lb (96.616 kg)  BMI 38.95 kg/m2  Physical Exam  Constitutional: She is oriented to person, place, and time. She appears well-developed and well-nourished.  Obese  HENT:  Head: Normocephalic.  Nose: Nose normal.  Mouth/Throat: Oropharynx is clear and moist.   Eyes: Conjunctivae are normal. Pupils are equal, round, and reactive to light.  Neck: Normal range of motion. Neck supple. No JVD present.  Cardiovascular: Normal rate, regular rhythm, S1 normal, S2 normal, normal heart sounds and intact distal pulses.  Exam reveals no gallop and no friction rub.   No murmur heard. Pulmonary/Chest: Effort normal and breath sounds normal. No respiratory distress. She has no wheezes. She has no rales. She exhibits no tenderness.  Abdominal: Soft. Bowel sounds are normal. She exhibits no distension. There is no tenderness.  Musculoskeletal: Normal range of motion. She exhibits no edema or tenderness.  Lymphadenopathy:    She has no cervical adenopathy.  Neurological: She is alert and oriented to person, place, and time. Coordination normal.  Skin: Skin is warm and dry. No rash noted. No erythema.  Psychiatric: She has a normal mood and affect. Her behavior is normal. Judgment and thought content normal.    Assessment and Plan  Nursing note and vitals reviewed.

## 2014-08-15 NOTE — Assessment & Plan Note (Signed)
Etiology of her shortness of breath is likely multifactorial including obesity, deconditioning, paced rhythm. She reports that she is unable to exercise given her chronic severe osteoarthritis of her knees We have recommended water aerobics

## 2014-08-15 NOTE — Assessment & Plan Note (Signed)
Recent echocardiogram showing normal systolic function, normal right ventricular systolic pressures. Continue Lasix as she is taking, when necessary for leg edema Currently appears euvolemic

## 2014-08-15 NOTE — Patient Instructions (Signed)
You are doing well. No medication changes were made.  Please call us if you have new issues that need to be addressed before your next appt.  Your physician wants you to follow-up in: 6 months.  You will receive a reminder letter in the mail two months in advance. If you don't receive a letter, please call our office to schedule the follow-up appointment.   

## 2014-09-16 ENCOUNTER — Other Ambulatory Visit: Payer: Self-pay | Admitting: Cardiovascular Disease

## 2014-09-24 ENCOUNTER — Ambulatory Visit (INDEPENDENT_AMBULATORY_CARE_PROVIDER_SITE_OTHER): Payer: Medicare Other | Admitting: *Deleted

## 2014-09-24 DIAGNOSIS — I5032 Chronic diastolic (congestive) heart failure: Secondary | ICD-10-CM

## 2014-09-24 DIAGNOSIS — I4891 Unspecified atrial fibrillation: Secondary | ICD-10-CM | POA: Diagnosis not present

## 2014-09-24 NOTE — Progress Notes (Signed)
Remote pacemaker transmission.   

## 2014-09-27 LAB — MDC_IDC_ENUM_SESS_TYPE_REMOTE
Battery Remaining Longevity: 38 mo
Battery Voltage: 2.96 V
Brady Statistic AP VP Percent: 0 %
Brady Statistic AS VP Percent: 100 %
Brady Statistic RV Percent Paced: 100 %
Date Time Interrogation Session: 20160419145144
Lead Channel Impedance Value: 1045 Ohm
Lead Channel Impedance Value: 494 Ohm
Lead Channel Impedance Value: 551 Ohm
Lead Channel Pacing Threshold Amplitude: 0.625 V
Lead Channel Pacing Threshold Amplitude: 1.875 V
Lead Channel Pacing Threshold Pulse Width: 0.4 ms
Lead Channel Sensing Intrinsic Amplitude: 5.125 mV
Lead Channel Sensing Intrinsic Amplitude: 5.125 mV
Lead Channel Setting Pacing Amplitude: 2.75 V
Lead Channel Setting Pacing Pulse Width: 0.4 ms
MDC IDC MSMT LEADCHNL LV IMPEDANCE VALUE: 646 Ohm
MDC IDC MSMT LEADCHNL LV IMPEDANCE VALUE: 684 Ohm
MDC IDC MSMT LEADCHNL LV IMPEDANCE VALUE: 779 Ohm
MDC IDC MSMT LEADCHNL LV PACING THRESHOLD PULSEWIDTH: 1 ms
MDC IDC MSMT LEADCHNL RA IMPEDANCE VALUE: 4047 Ohm
MDC IDC MSMT LEADCHNL RA IMPEDANCE VALUE: 4047 Ohm
MDC IDC MSMT LEADCHNL RV IMPEDANCE VALUE: 456 Ohm
MDC IDC SET LEADCHNL LV PACING PULSEWIDTH: 1 ms
MDC IDC SET LEADCHNL RV PACING AMPLITUDE: 2 V
MDC IDC SET LEADCHNL RV SENSING SENSITIVITY: 0.9 mV
MDC IDC SET ZONE DETECTION INTERVAL: 150 ms
MDC IDC STAT BRADY AP VS PERCENT: 0 %
MDC IDC STAT BRADY AS VS PERCENT: 0 %
MDC IDC STAT BRADY RA PERCENT PACED: 0 %
Zone Setting Detection Interval: 400 ms

## 2014-10-06 NOTE — Consult Note (Signed)
General Aspect Primary Cardiologist: Dr. Rockey Situ, MD _______________  80 year old female with history of atrial fibrillation status post arteriovenous node ablation, tachybradia syndrome s/p MDT pacer/CRT-P in 06/2010, hypertension, paroxysmal nocturnal dyspnea, hypothyroidism, diastolic congestive heart failure, C. difficile infection, and history of intermittent SOB who presented to [facility] on 06/11/2014 with a 1 day history of increased SOB and bilateral chest pain.  _____________  PMH: 1. atrial fibrillation status post arteriovenous node ablation 2. tachybradia syndrome s/p MDT pacer/CRT-P in 06/2010 3. HTN 4. PND 5. Hypothyroidism 6. Diastolic CHF 7. History of C diff infection  8. history of intermittent SOB 9. GERD - Barretts esophagus _____________   Present Illness 80 year old female with the above problem list who presented to [facility] on 06/11/2014 with a one day history of increased dyspnea and bilateral chest pain.  She has known intermittent SOB and is not able to walk great distances, requiring her to use wheel chairs and assistance when at shopping centers. Last echo in 2014 showed EF 55-60%, no regional WMA, mild MR, mildly dilated LA/RA, PASP 34 mm Hg. She has previously tried cardiac/pulmonary rehab but had to stop 2/2 worsening knee pain. Does not do any regular exercise. Weight continues to be a problem. Has attempted sleep study, however this was cut short 2/2 "CHB." Notes indicate a 43 second period of complete heart block with ventricular standstill. Atrial rate was 80 beats per minute. Note indicates the patient was awake and alert during the event with some dizziness. Device was evaluated and functioning properly. No further cardiac work up 2/2 pacer in place. It was advised that she increase her Lasix at her last outpatient office vist on 05/20/2014 secondary to her SOB and diastolic HF. Unfortunately, she did not do this as she did not want to be going to the bathroom over the  holiday season while she had friends and family over. She last under went a [employer] 07/2012 that showed no significant ischemia with an EF of 65%.   She presented to [facility] on 06/11/2014 with a 1 day history of increased SOB and bilateral chest pain that was occuring while at rest. Chest pain occured at that outer edges of her chest and radiated inward. Not associated with exertion. No associated diaphoresis, nausea, vomiting, presyncope, or syncope. Pain was intermittent, feeling like a spasm. She has been belching more as of late and the belching is a lot louder than in previous years.   Upon her arrival to [facility] she was found to have troponin negative x 2, WBC count 14.1, CXR with cardiomegaly other negative. She is currently midly SOB with increaed talking.   Physical Exam:  GEN well developed, well nourished, no acute distress, obese   HEENT hearing intact to voice, moist oral mucosa   NECK supple   RESP normal resp effort  clear BS   CARD Regular rate and rhythm  Normal, S1, S2  No murmur   ABD denies tenderness  soft   LYMPH negative neck   EXTR negative edema   SKIN normal to palpation   NEURO cranial nerves intact, motor/sensory function intact   PSYCH alert, A+O to time, place, person   Review of Systems:  Subjective/Chief Complaint SOB, chest pain   General: Fatigue  Weakness   Skin: No Complaints   ENT: No Complaints   Eyes: No Complaints   Neck: No Complaints   Respiratory: Short of breath   Cardiovascular: Chest pain or discomfort  Tightness  Dyspnea  Gastrointestinal: No Complaints   Genitourinary: No Complaints   Vascular: No Complaints   Musculoskeletal: No Complaints   Neurologic: Dizzness   Hematologic: No Complaints   Endocrine: No Complaints   Psychiatric: No Complaints   Review of Systems: All other systems were reviewed and found to be negative   Medications/Allergies Reviewed Medications/Allergies reviewed   Family &  Social History:  Family and Social History:  Family History Coronary Artery Disease  Cancer   Social History negative tobacco, negative ETOH, negative Illicit drugs   Place of Living Home     heart ablation:    statins make her flle like she has the flu:    Sleep Apnea:    reflux:    migraines:    Atrial Fibrillation:    pleursy:    hypertension:    high triglyceride levels  can't take statins:    3 heart caths which were normal:    Carpal Tunnel Release:    Hysterectomy - Partial:    Cholecystectomy:    Appendectomy:          Admit Reason:   Central chest pain (R07.9): Status: Active, Coding System: ICD10, Coded Name: Chest pain, unspecified  Home Medications: Medication Instructions Status  potassium chloride 10 mEq oral tablet, extended release Take one tab when ever you take a lasix tablet. Active  furosemide 20 mg oral tablet 1 tab(s) orally once a day Active  Symbicort 160 mcg-4.5 mcg/inh inhalation aerosol 2 puff(s) inhaled 2 times a day Active  Nitrostat 0.4 mg sublingual tablet 1 tab(s) sublingual every 5 minutes, As Needed - for Chest Pain Active  Pepcid 20 mg oral tablet 1 tab(s) orally 2 times a day Active  Synthroid 112 mcg (0.112 mg) oral tablet 1 tab(s) orally once a day Active  Lopressor 50 mg oral tablet 1 tab(s) orally 2 times a day Active  Eliquis 5 mg oral tablet 1 tab(s) orally 2 times a day Active  Vitamin Daily Liquid 5000 unit(s) orally every 3 weeks Active  Vitamin B-12  injectable  Active  prochlorperazine 5 mg oral tablet 1 tab(s) orally 3 times a day Active  atropine  orally  Active   Lab Results:  Hepatic:  05-Jan-16 06:10   Bilirubin, Total 0.5  Alkaline Phosphatase 70 (46-116 NOTE: New Reference Range 12/25/13)  SGPT (ALT) 18 (14-63 NOTE: New Reference Range 12/25/13)  SGOT (AST) 33  Total Protein, Serum 6.9  Albumin, Serum  3.1  Routine Chem:  05-Jan-16 06:10   Glucose, Serum 95  BUN  24  Creatinine (comp) 1.23   Sodium, Serum 139  Potassium, Serum 5.1  Chloride, Serum  108  CO2, Serum 23  Calcium (Total), Serum 8.9  Osmolality (calc) 281  eGFR (African American)  55  eGFR (Non-African American)  45 (eGFR values <6m/min/1.73 m2 may be an indication of chronic kidney disease (CKD). Calculated eGFR, using the MRDR Study equation, is useful in  patients with stable renal function. The eGFR calculation will not be reliable in acutely ill patients when serum creatinine is changing rapidly. It is not useful in patients on dialysis. The eGFR calculation may not be applicable to patients at the low and high extremes of body sizes, pregnant women, and vegetarians.)  Result Comment POTASSIUM/BUN/CREATININE - Slight hemolysis, interpret results with AST/TOTAL PROTEIN/ CK TOT - caution.  Result(s) reported on 11 Jun 2014 at 06:29AM.  Anion Gap 8  Cardiac:  05-Jan-16 06:10   Troponin I < 0.02 (0.00-0.05 0.05 ng/mL or less:  NEGATIVE  Repeat testing in 3-6 hrs  if clinically indicated. >0.05 ng/mL: POTENTIAL  MYOCARDIAL INJURY. Repeat  testing in 3-6 hrs if  clinically indicated. NOTE: An increase or decrease  of 30% or more on serial  testing suggests a  clinically important change)  CK, Total 58  CPK-MB, Serum 0.6 (Result(s) reported on 11 Jun 2014 at 06:57AM.)    10:33   Troponin I < 0.02 (0.00-0.05 0.05 ng/mL or less: NEGATIVE  Repeat testing in 3-6 hrs  if clinically indicated. >0.05 ng/mL: POTENTIAL  MYOCARDIAL INJURY. Repeat  testing in 3-6 hrs if  clinically indicated. NOTE: An increase or decrease  of 30% or more on serial  testing suggests a  clinically important change)  Routine Coag:  05-Jan-16 06:10   Prothrombin -  INR - (INR reference interval applies to patients on anticoagulant therapy. A single INR therapeutic range for coumarins is not optimal for all indications; however, the suggested range for most indications is 2.0 - 3.0. Exceptions to the INR Reference  Range may include: Prosthetic heart valves, acute myocardial infarction, prevention of myocardial infarction, and combinations of aspirin and anticoagulant. The need for a higher or lower target INR must be assessed individually. Reference: The Pharmacology and Management of the Vitamin K  antagonists: the seventh ACCP Conference on Antithrombotic and Thrombolytic Therapy. IRCVE.9381 Sept:126 (3suppl): [id number]. A HCT value >55% may artifactually increase the PT.  In one study,  the increase was an average of 25%. Reference:  "Effect on Routine and Special Coagulation Testing Values of Citrate Anticoagulant Adjustment in Patients with High HCT Values." American Journal of Clinical Pathology 2006;126:400-405.)  Routine Hem:  05-Jan-16 06:10   WBC (CBC)  14.1  RBC (CBC) 4.24  Hemoglobin (CBC) 12.7  Hematocrit (CBC) 38.7  Platelet Count (CBC) 266  MCV 91  MCH 30.0  MCHC 32.8  RDW 13.3  Neutrophil % 48.4  Lymphocyte % 36.2  Monocyte % 10.9  Eosinophil % 3.8  Basophil % 0.7  Neutrophil #  6.8  Lymphocyte #  5.1  Monocyte #  1.5  Eosinophil # 0.5  Basophil # 0.1 (Result(s) reported on 11 Jun 2014 at 06:29AM.)   EKG:  EKG Interp. by me   Interpretation EKG shows paced rhythm, rate 76 bpm   Radiology Results: XRay:    05-Jan-16 06:18, Chest Portable Single View  Chest Portable Single View   REASON FOR EXAM:    Chest pain  COMMENTS:       PROCEDURE: DXR - DXR PORTABLE CHEST SINGLE VIEW  - Jun 11 2014  6:18AM     CLINICAL DATA:  Chest pain. Shortness of breath. Atrial  fibrillation. Pacemaker. History of mini strokes.    EXAM:  PORTABLE CHEST - 1 VIEW    COMPARISON:  04/03/2013    FINDINGS:  Cardiac pacemaker. Mild cardiac enlargement without vascular  congestion. No focal airspace disease or consolidation in the lungs.  No blunting of costophrenic angles. No pneumothorax.     IMPRESSION:  Cardiac enlargement.  No evidence of active pulmonary  disease.      Electronically Signed    By: Lucienne Capers M.D.    On: 06/11/2014 06:29         Verified By: Neale Burly, M.D.,    Codeine: Resp. Distress  IVP Dye: Rash  Iodinated Radiocontrast Dyess: Rash  Iodine: Rash  Shellfish: GI Distress  Percocet: Other  Morphine: Other  Persantine: Unknown  Sulfa drugs: Unknown  Vital Signs/Nurse's Notes: **  Vital Signs.:   05-Jan-16 10:41  Vital Signs Type Admission  Temperature Temperature (F) 97.3  Celsius 36.2  Temperature Source oral  Pulse Pulse 77  Respirations Respirations 18  Systolic BP Systolic BP 607  Diastolic BP (mmHg) Diastolic BP (mmHg) 82  Mean BP 103  Pulse Ox % Pulse Ox % 97  Pulse Ox Activity Level  At rest  Oxygen Delivery Room Air/ 21 %    Impression 80 year old female with history of atrial fibrillation status post arteriovenous node ablation, tachybradia syndrome s/p MDT pacer/CRT-P in 06/2010, hypertension, paroxysmal nocturnal dyspnea, hypothyroidism, diastolic congestive heart failure, C. difficile infection, and history of intermittent SOB who presented to [facility] [place] on 06/11/2014 with a 1 day history of increased SOB and bilateral chest pain.  1. SOB: -Long history of SOB -Likely multifactorial, including deconditioning, obesity, and possible some component of diastolic CHF  -Check  echo to evaluate LV function, wall motion, and RV pressure  -Outpatient plan on 05/20/2014 of increasing Lasix 40 mg bid short term, then down to 40 mg daily with monitoring her symptoms (unfortunately she did not do this 2/2 the holiday season). She does not appear to be greatly volume overloaded at this time. -Await echo to further guide therapy  2. Atypical chest pain: -Non-exertional, troponin negative x 2 -Echo as above -Can pursue outpatient eval if indicated   3. Permenant a-fib: -Status post MDT PPM/CRT-P -She was scheduled for device interrogation today, this will have to be rescheduled to evaluate if her  device settings need to be changed as a potential cause of her SOB -Continue Eliquis  4. Chronic diastolic CHF: -Does not appear volume overloaded -Continue current home medications -Await echo  5. HTN: -Controlled   Electronic Signatures: Rise Mu (PA-C)  (Signed 05-Jan-16 13:20)  Authored: General Aspect/Present Illness, History and Physical Exam, Review of System, Family & Social History, Past Medical History, Home Medications, Labs, EKG , Radiology, Allergies, Vital Signs/Nurse's Notes, Impression/Plan Ida Rogue (MD)  (Signed 05-Jan-16 20:54)  Authored: General Aspect/Present Illness, History and Physical Exam, Review of System, Health Issues, Home Medications, EKG , Impression/Plan  Co-Signer: General Aspect/Present Illness, History and Physical Exam, Review of System, Family & Social History, Past Medical History, Home Medications, Labs, EKG , Radiology, Allergies, Vital Signs/Nurse's Notes, Impression/Plan   Last Updated: 05-Jan-16 20:54 by Ida Rogue (MD)

## 2014-10-06 NOTE — Discharge Summary (Signed)
PATIENT NAME:  Jacqueline Lozano, Jacqueline Lozano MR#:  [id number] DATE OF BIRTH:  1934/10/09  DATE OF ADMISSION:  06/11/2014 DATE OF DISCHARGE:  06/11/2014  ADMITTING DIAGNOSIS:  Chest pain.  DISCHARGE DIAGNOSES: 1.  Chest pain, atypical in nature, possibly gastrointestinal-related. I have discussed the case with Dr. Rockey Situ, who will see the patient as an outpatient.  2.  Chronic atrial fibrillation.  3.  History of Barrett esophagus.  4.  Obstructive sleep apnea. The patient reports that she had 2 sleep studies; the initial one was severely positive, but the second one was negative, so she is not wearing CPAP.  5.  History of colitis.  6.  Migraine headaches.  7.  Status post cholecystectomy, hysterectomy, appendectomy, 2 dilation and curettage procedures, carpal tunnel release, pacemaker implantation, and cardiac ablation x 2.   CONSULTANTS:  None.   PERTINENT LABS AND EVALUATIONS:  EKG showed paced rhythm without any ST-T wave changes. Glucose was 95, BUN 24, creatinine 1.23, sodium 139, potassium 5.1, chloride 108, and CO2 was 23. LFTs showed albumin of 3.1. Troponin was less than 0.02 x 2. WBC was 14.11, hemoglobin 12.7, and platelet count was 226,000. INR was 1.4. Urinalysis was negative. Chest x-ray showed no acute cardiopulmonary processes, cardiac enlargement.  HOSPITAL COURSE:  Please refer to H and P done by the admitting physician. The patient is a 80 year old white female who has had a negative stress 1 year prior, presented with atypical chest pain symptoms. She was seen in the ED and was admitted for observation. The patient was observed. Serial cardiac enzymes were done, which remained negative. I discussed the case with Dr. Rockey Situ, who is the patient's primary cardiologist, who stated that he knows the patient very well, and if her workup is negative, she can be followed up as an outpatient. At this time, the patient is doing much better and is stable for discharge with outpatient followup with  Dr. Rockey Situ in the next few days.   DISCHARGE MEDICATIONS:  Symbicort 2 puffs b.i.d., Nitrostat 0.4 sublingually p.r.n., Synthroid 112 mcg daily, Lasix 20 one tab p.o. daily, potassium chloride 10 mEq whenever she takes Lasix, Lopressor 50 one tab p.o. b.i.d., Eliquis 5 one tab p.o. b.i.d., vitamin D 5000 international units every 3 weeks, vitamin B12 injectable monthly, promethazine 5 mg 1 tablet p.o. t.i.d., Protonix 40 one tab p.o. daily.   DIET:  Low sodium, low fat, low cholesterol.   ACTIVITY:  As tolerated.   TIMEFRAME FOR FOLLOWUP:  With Dr. Rockey Situ in 1 to 2 days.   TIME SPENT ON THE DISCHARGE:  35 minutes.   ____________________________ Lafonda Mosses Posey Pronto, MD shp:nb D: 06/11/2014 20:23:22 ET T: 06/12/2014 01:14:00 ET JOB#: [id number]  cc: Alpa Salvo H. Posey Pronto, MD, <Dictator> Alric Seton MD ELECTRONICALLY SIGNED 06/15/2014 9:25

## 2014-10-06 NOTE — H&P (Signed)
PATIENT NAME:  Jacqueline Lozano, Jacqueline Lozano MR#:  [id number] DATE OF BIRTH:  June 14, 1934  DATE OF ADMISSION:  06/11/2014   REFERRING PHYSICIAN: Gregor Hams, MD    PRIMARY CARE PHYSICIAN:  [name], MD   ADMITTING DIAGNOSIS: Chest pain.   HISTORY OF PRESENT ILLNESS: This is a 80 year old Caucasian female who presents to the Emergency Department complaining of chest pain. The patient states that it began approximately 12 hours prior to admission. It began as tension that was 4/5 in severity across the middle portion of her chest. The patient thought that it may be indigestion.  She states the pain progressively worsened and became 10/10 in severity. She states that it comes and goes. It is sharp in character and lasts for less than a minute.  She admits that she feels like she "wants to her arch her back" every time the pain occurs.  She also admits to belching a lot over the last few weeks. The concerning detail in her symptoms is that she also becomes dizzy sometimes with the chest pain, although notably she has dizziness without chest pain as well.    REVIEW OF SYSTEMS:  CONSTITUTIONAL: The patient denies fever or weakness.  EYES: Denies blurred vision or inflammation.  EARS, NOSE AND THROAT: Denies tinnitus or sore throat.  RESPIRATORY: Denies cough or shortness of breath.  CARDIOVASCULAR: Admits to chest pain, as well as dyspnea on exertion, but denies orthopnea, or paroxysmal nocturnal dyspnea.  GASTROINTESTINAL: Denies nausea, vomiting or diarrhea.  GENITOURINARY: Denies dysuria, increased frequency, or hesitancy of urination.  ENDOCRINE: Denies polyuria, polydipsia.  HEMATOLOGIC AND LYMPHATIC: Denies easy bruising or bleeding.  INTEGUMENT: Denies rashes or lesions. MUSCULOSKELETAL: Denies arthralgias or myalgias.  NEUROLOGIC: Denies numbness in extremities or dysarthria.  PSYCHIATRIC: Denies depression or suicidal ideation.   PAST MEDICAL HISTORY: Hypertension, atrial fibrillation,  Barrett's esophagus, obstructive sleep apnea, history of colitis, and migraine headaches.   PAST SURGICAL HISTORY: Cholecystectomy, hysterectomy, appendectomy, 2 dilatation and curettage procedures, carpal tunnel release, pacemaker implantation and cardiac ablation x 2.   SOCIAL HISTORY: The patient does not smoke, drink, or do any drugs.   FAMILY HISTORY: The patient's daughter had papillary thyroid cancer. Hypertension also runs throughout the family.   MEDICATIONS:  1.  Eliquis 5 mg 1 tablet p.o. b.i.d.  2.  Furosemide 20 mg 1 tablet p.o. daily.  3.  Ketorolac 10 mg 1 tablet p.o. 4 times a day.  4.  Lopressor 50 mg 1 tablet p.o. b.i.d.  5.  Nitrostat 0.4 mg sublingually 1 tablet sublingually every 5 minutes needed for chest pain.  6.  Pepcid 20 mg 1 tablet p.o. b.i.d.  7.  Potassium chloride 10 mEq extended release 1 tablet whenever she used needs to take Lasix.  8.  Symbicort 160 mcg -4.5 mcg inhaled solution, 2 puffs inhaled b.i.d.  9.  Synthroid 112 mcg 1 tablet p.o. daily.   ALLERGIES: CODEINE, IODINATED RADIOCONTRAST DYES, IODINE, IVP DYE, MORPHINE, PERCOCET, PERSANTINE, SULFA DRUGS, AND SHELLFISH.   PERTINENT LABORATORY RESULTS AND RADIOGRAPHIC FINDINGS: Serum glucose is 95, BUN 24, creatinine 1.23, serum sodium 139, potassium 5.1, chloride 108, bicarbonate is 23, calcium is 8.9, serum albumin is 3.1, alkaline phosphatase 70, AST 33, ALT 18. Troponin is negative. White blood cell count is 14.1, hemoglobin is 12.7, hematocrit is 38.7, platelet count is 266,000. MCV is 91. INR is 1.4. Urinalysis shows 5 white blood cells per high-power field. It is nitrite and leukocyte esterase negative.  Chest x-ray shows cardiac enlargement,  but no active pulmonary disease.   PHYSICAL EXAMINATION:  VITAL SIGNS: Temperature is 97.5, pulse 72, respirations 16, blood pressure 188/80, and pulse oximetry is 99% on room air.  GENERAL: The patient is alert and oriented x 3 in no apparent distress.   HEENT: Normocephalic, atraumatic. Pupils are equal, round and reactive to light and accommodation.  Extraocular movements are intact. Mucous membranes are moist.  NECK: Trachea is midline. No adenopathy. Thyroid is not palpable and nontender.  CHEST: Symmetric and atraumatic.  CARDIOVASCULAR: Regular rate and rhythm. Normal S1, S2. No rubs, clicks, or murmurs appreciated.  LUNGS: Clear to auscultation bilaterally. Normal effort and excursion.  ABDOMEN: Positive bowel sounds. Soft, nontender, nondistended. No hepatosplenomegaly.   GENITOURINARY: Deferred.  MUSCULOSKELETAL: The patient moves all 4 extremities equally. She has 5/5 strength in upper and lower extremities bilaterally.  SKIN: Warm and dry no rashes or lesions.  EXTREMITIES: No clubbing, cyanosis. There is trace edema of the shins.  NEUROLOGIC: Cranial nerves II through XII are grossly intact.  PSYCHIATRIC: Mood is normal. Affect is congruent. The patient has excellent insight and judgment into her illness.   ASSESSMENT AND PLAN: This is a 80 year old female admitted for chest pain.  1.  Chest pain is atypical for cardiac chest pain.  It is likely acid reflux.  The patient actually describes the arching sensation that many children and some adults experience when they have bad heartburn. She has a history of Barrett's esophagus, which is consistent with such symptoms. Her troponin is negative.  She has no signs of ischemia on her EKG although it is a paced rhythm, status post ablation.  The patient was due to go to a cardiology appointment today for interrogation of her pacemaker.  I have ordered a cardiology consult to do the same and to comment on further direction for evaluation of her cardiovascular risk.  Notably, the patient states that she had a cardiac catheterization approximately 30 years ago, which did not show any lesions that required intervention at the time.  2.  Leukocytosis. Etiology is unclear at this time. The patient  does have 5 white blood cells in her urine, but she is asymptomatic. There are no signs or symptoms of sepsis.  3.  Atrial fibrillation. The patient is rate controlled. Will continue Eliquis.   4.  Hypertension. Continue metoprolol.  5.  Gastroesophageal reflux disease.  I have started the patient on a proton pump inhibitor due to her history of Barrett's esophagus.  6.  Deep vein thrombosis prophylaxis, Apixaban.  7.  Gastrointestinal prophylaxis as above.  CODE STATUS:  The patient is a FULL CODE.   TIME SPENT ON ADMISSION ORDERS AND PATIENT CARE: Approximately 45 minutes.    ____________________________ Norva Riffle. Marcille Blanco, MD msd:DT D: 06/11/2014 09:14:14 ET T: 06/11/2014 10:05:22 ET JOB#: [id number]  cc: Norva Riffle. Marcille Blanco, MD, <Dictator> Norva Riffle Carolie Mcilrath MD ELECTRONICALLY SIGNED 06/19/2014 22:58

## 2014-10-25 ENCOUNTER — Encounter: Payer: Self-pay | Admitting: Cardiology

## 2014-10-30 ENCOUNTER — Encounter: Payer: Self-pay | Admitting: Internal Medicine

## 2014-11-18 ENCOUNTER — Ambulatory Visit: Payer: Medicare Other | Admitting: Cardiovascular Disease

## 2014-11-18 ENCOUNTER — Telehealth: Payer: Self-pay | Admitting: Cardiovascular Disease

## 2014-11-18 NOTE — Telephone Encounter (Signed)
Spoke w/ pt.  She reports that she has not taken meclizine and would like to have this removed from her med list.  Advised her to speak w/ PCP regarding tremors, but she states that they are not bothersome, but she will speak w/ Dr. Kary Kos if they worsen.

## 2014-11-18 NOTE — Telephone Encounter (Signed)
Patient says she has been having some tremors and wants to know if this is a side effect of a medicine.  Please call   Patient also wants to know why antivert is on med list as she does not take this.     Patient was scheduled earlier than 6 month time frame per [facility] and rescheduled for September.

## 2014-12-14 ENCOUNTER — Other Ambulatory Visit: Payer: Self-pay | Admitting: Cardiovascular Disease

## 2014-12-16 ENCOUNTER — Telehealth: Payer: Self-pay | Admitting: *Deleted

## 2014-12-16 MED ORDER — METOPROLOL TARTRATE 50 MG PO TABS: 75.0000 mg | ORAL_TABLET | Freq: Two times a day (BID) | ORAL | Status: DC

## 2014-12-16 NOTE — Telephone Encounter (Signed)
Refill sent for metoprolol.  

## 2014-12-16 NOTE — Telephone Encounter (Signed)
°  1. Which medications need to be refilled? Metoprolol  2. Which pharmacy is medication to be sent to? [facility] on [address]   3. Do they need a 30 day or 90 day supply? 90   4. Would they like a call back once the medication has been sent to the pharmacy? No   Pt is completely out.

## 2014-12-24 ENCOUNTER — Ambulatory Visit (INDEPENDENT_AMBULATORY_CARE_PROVIDER_SITE_OTHER): Payer: Medicare Other | Admitting: *Deleted

## 2014-12-24 DIAGNOSIS — I4891 Unspecified atrial fibrillation: Secondary | ICD-10-CM | POA: Diagnosis not present

## 2014-12-24 DIAGNOSIS — I5032 Chronic diastolic (congestive) heart failure: Secondary | ICD-10-CM

## 2014-12-24 NOTE — Progress Notes (Signed)
Remote pacemaker transmission.   

## 2015-01-08 LAB — CUP PACEART REMOTE DEVICE CHECK
Battery Remaining Longevity: 32 mo
Brady Statistic AP VP Percent: 0 %
Brady Statistic AS VS Percent: 0 %
Brady Statistic RA Percent Paced: 0 %
Date Time Interrogation Session: 20160719142024
Lead Channel Impedance Value: 4047 Ohm
Lead Channel Impedance Value: 4047 Ohm
Lead Channel Impedance Value: 437 Ohm
Lead Channel Impedance Value: 494 Ohm
Lead Channel Impedance Value: 570 Ohm
Lead Channel Impedance Value: 722 Ohm
Lead Channel Pacing Threshold Amplitude: 2.125 V
Lead Channel Pacing Threshold Pulse Width: 1 ms
Lead Channel Sensing Intrinsic Amplitude: 5.125 mV
Lead Channel Sensing Intrinsic Amplitude: 5.125 mV
Lead Channel Setting Pacing Amplitude: 2 V
Lead Channel Setting Pacing Pulse Width: 0.4 ms
Lead Channel Setting Pacing Pulse Width: 1 ms
Lead Channel Setting Sensing Sensitivity: 0.9 mV
MDC IDC MSMT BATTERY VOLTAGE: 2.95 V
MDC IDC MSMT LEADCHNL LV IMPEDANCE VALUE: 1064 Ohm
MDC IDC MSMT LEADCHNL LV IMPEDANCE VALUE: 665 Ohm
MDC IDC MSMT LEADCHNL LV IMPEDANCE VALUE: 779 Ohm
MDC IDC MSMT LEADCHNL RV PACING THRESHOLD AMPLITUDE: 0.75 V
MDC IDC MSMT LEADCHNL RV PACING THRESHOLD PULSEWIDTH: 0.4 ms
MDC IDC SET LEADCHNL LV PACING AMPLITUDE: 2.75 V
MDC IDC SET ZONE DETECTION INTERVAL: 150 ms
MDC IDC STAT BRADY AP VS PERCENT: 0 %
MDC IDC STAT BRADY AS VP PERCENT: 100 %
MDC IDC STAT BRADY RV PERCENT PACED: 100 %
Zone Setting Detection Interval: 400 ms

## 2015-01-15 ENCOUNTER — Other Ambulatory Visit: Payer: Self-pay | Admitting: Cardiovascular Disease

## 2015-01-21 ENCOUNTER — Encounter: Payer: Self-pay | Admitting: Cardiology

## 2015-01-28 ENCOUNTER — Encounter: Payer: Self-pay | Admitting: Internal Medicine

## 2015-02-19 ENCOUNTER — Encounter: Payer: Self-pay | Admitting: Cardiovascular Disease

## 2015-02-19 ENCOUNTER — Ambulatory Visit (INDEPENDENT_AMBULATORY_CARE_PROVIDER_SITE_OTHER): Payer: Medicare Other | Admitting: Cardiovascular Disease

## 2015-02-19 VITALS — BP 128/68 | HR 75 | Ht 62.0 in | Wt 208.0 lb

## 2015-02-19 DIAGNOSIS — R0602 Shortness of breath: Secondary | ICD-10-CM | POA: Diagnosis not present

## 2015-02-19 DIAGNOSIS — I4891 Unspecified atrial fibrillation: Secondary | ICD-10-CM | POA: Diagnosis not present

## 2015-02-19 DIAGNOSIS — I5032 Chronic diastolic (congestive) heart failure: Secondary | ICD-10-CM

## 2015-02-19 DIAGNOSIS — I10 Essential (primary) hypertension: Secondary | ICD-10-CM

## 2015-02-19 NOTE — Assessment & Plan Note (Signed)
Encouraged her to stay on her Lasix daily. No clinical signs of heart failure.

## 2015-02-19 NOTE — Assessment & Plan Note (Signed)
multifactorial including obesity, deconditioning, paced rhythm.  unable to exercise given her chronic severe osteoarthritis of her knees We have recommended water aerobics

## 2015-02-19 NOTE — Assessment & Plan Note (Signed)
Blood pressure is well controlled on today's visit. No changes made to the medications. 

## 2015-02-19 NOTE — Progress Notes (Signed)
Patient ID: Jacqueline Lozano, female    DOB: 1935/06/03, 80 y.o.   MRN: [id number]  HPI Comments: Jacqueline Lozano is a pleasant 36 -year-old woman with morbid obesity,  atrial fibrillation, status post arteriovenous node ablation, Pacer/CRT-P, Hypertension, Paroxysmal nocturnal dyspnea, Hypothyroidism, Diastolic congestive heart failure, C. difficile infection, who presents for followup of her shortness of breath. Previous pacemaker interrogation shows that she is  ventricularly paced, permanent atrial fibrillation Followed by Dr. Caryl Comes  In follow-up, she reports that she is doing well. Continued shortness of breath, possibly worse recently. She attributes this to a upper respiratory infection, postnasal drip, allergies. She denies any lower extremity edema. She takes Lasix 20 mg daily prn She reports that she is in the doughnut hole, anticoagulation medication is $160  EKG on today's visit shows normal sinus rhythm with rate 75 bpm, paced rhythm  Other past medical history  unable to participate in pulmonary rehabilitation secondary to chronic knee pain, osteoarthritis. Weight and conditioning is a major issue.  echocardiogram shows normal LV function, stable valve disease, normal right ventricular systolic pressures.  Previous visit to the emergency room for atypical chest pain. Cardiac enzymes were negative and she was discharged home Prior workup with echocardiogram in 2014 and stress test were unrevealing.   sleep study in October and again in November. Sleep study in October suggested she repeat the test as it was incomplete. The note indicates the study was discontinued as the patient had heart block, with limited EKG monitoring appearing to demonstrate a 43 second period of complete heart block with ventricular standstill. Atrial rate was 80 beats per minute. Note indicates the patient was awake and alert during the event with some dizziness. Pacer was checked after this event,  Normal  functioning device.  Followup sleep study 04/24/2013 recommended weight loss, though there was no indications for CPAP at this time. Previous stress test  early 2014 showed no ischemia        Allergies  Allergen Reactions  . Ivp Dye [Iodinated Diagnostic Agents] Shortness Of Breath  . Codeine   . Iodine   . Morphine   . Oxycodone-Acetaminophen   . Persantine [Dipyridamole]   . Statins   . Sulfonamide Derivatives   . Amlodipine Rash    Outpatient Encounter Prescriptions as of 02/19/2015  Medication Sig  . amoxicillin-clavulanate (AUGMENTIN) 875-125 MG per tablet Take 1 tablet by mouth 2 (two) times daily.  Marland Kitchen ELIQUIS 5 MG TABS tablet TAKE ONE TABLET BY MOUTH TWICE DAILY  . famotidine (PEPCID) 10 MG tablet Take 10 mg by mouth daily.  . furosemide (LASIX) 20 MG tablet TAKE ONE TABLET BY MOUTH TWICE DAILY  (Patient taking differently: TAKE ONE TABLET BY MOUTH DAILY)  . furosemide (LASIX) 40 MG tablet Take 1 tablet (40 mg total) by mouth 2 (two) times daily. (Patient taking differently: Take 40 mg by mouth as needed. )  . GuaiFENesin (MUCINEX PO) Take by mouth as needed.  Marland Kitchen levothyroxine (SYNTHROID, LEVOTHROID) 112 MCG tablet Take 112 mcg by mouth daily before breakfast.  . meclizine (ANTIVERT) 25 MG tablet Take 1 tablet (25 mg total) by mouth 3 (three) times daily as needed.  . metoprolol (LOPRESSOR) 50 MG tablet Take 1.5 tablets (75 mg total) by mouth 2 (two) times daily.  . nitroGLYCERIN (NITROSTAT) 0.4 MG SL tablet Place 1 tablet (0.4 mg total) under the tongue every 5 (five) minutes as needed for chest pain.  . potassium chloride (K-DUR,KLOR-CON) 10 MEQ tablet TAKE ONE TABLET BY  MOUTH ONE TIME DAILY   . prochlorperazine (COMPAZINE) 5 MG tablet Take 1 tablet (5 mg total) by mouth every 6 (six) hours as needed.  Marland Kitchen VITAMIN B1-B12 IM Inject into the muscle every 30 (thirty) days.  Marland Kitchen VITAMIN D, CHOLECALCIFEROL, PO Take 5,000 Units by mouth 3 (three) times a week.   No  facility-administered encounter medications on file as of 02/19/2015.    Past Medical History  Diagnosis Date  . Hypertension   . Hypothyroidism   . GERD (gastroesophageal reflux disease)   . Migraine   . OSA (obstructive sleep apnea)     Not consistently using CPAP  . Allergic to IV contrast   . PAD (peripheral artery disease)     Occlusive disease involving left PT and AT  . Hyperlipidemia     Has been unable to take statins due to muscle pain. Has tried multiple statins per her report.  . Abnormal echocardiogram 09/2009    EF >55%, mild LVH, moderate LAE, normal RV, normal PA systolic pressure  . Paroxysmal atrial fibrillation     Breakthrough a fib on sotalol, changed to amiodarone 4/11. DCCV 4/11  . Chronic diastolic CHF (congestive heart failure)   . Pacemaker-Medtronic CRT P. 11/23/2010    Atrial port was plugged   . Complete heart block-S./P. AV junction ablation 11/23/2010    Past Surgical History  Procedure Laterality Date  . Cardiac catheterization      Left heart cath x3 in teh past, last 5 yrs ago. All "normal" per pt report  . Pacemaker insertion  06/12/2010  . Atrial ablation surgery  06/12/2010  . Appendectomy    . Cholecystectomy open    . Vesicovaginal fistula closure w/ tah    . Carpal tunnel release    . Cardioversion      Social History  reports that she has never smoked. She has never used smokeless tobacco. She reports that she does not drink alcohol or use illicit drugs.  Family History family history includes Colon cancer in her daughter; Heart attack in her father and mother. There is no history of Stroke.  Review of Systems  Constitutional: Negative.   Respiratory: Positive for shortness of breath.   Cardiovascular: Negative.   Gastrointestinal: Negative.   Musculoskeletal: Negative.   Skin: Negative.   Neurological: Negative.   Hematological: Negative.   Psychiatric/Behavioral: Negative.   All other systems reviewed and are  negative.   BP 128/68 mmHg  Pulse 75  Ht 5\' 2"  (1.575 m)  Wt 208 lb (94.348 kg)  BMI 38.03 kg/m2  Physical Exam  Constitutional: She is oriented to person, place, and time. She appears well-developed and well-nourished.  Obese  HENT:  Head: Normocephalic.  Nose: Nose normal.  Mouth/Throat: Oropharynx is clear and moist.  Eyes: Conjunctivae are normal. Pupils are equal, round, and reactive to light.  Neck: Normal range of motion. Neck supple. No JVD present.  Cardiovascular: Normal rate, regular rhythm, S1 normal, S2 normal, normal heart sounds and intact distal pulses.  Exam reveals no gallop and no friction rub.   No murmur heard. Pulmonary/Chest: Effort normal and breath sounds normal. No respiratory distress. She has no wheezes. She has no rales. She exhibits no tenderness.  Abdominal: Soft. Bowel sounds are normal. She exhibits no distension. There is no tenderness.  Musculoskeletal: Normal range of motion. She exhibits no edema or tenderness.  Lymphadenopathy:    She has no cervical adenopathy.  Neurological: She is alert and oriented to person, place,  and time. Coordination normal.  Skin: Skin is warm and dry. No rash noted. No erythema.  Psychiatric: She has a normal mood and affect. Her behavior is normal. Judgment and thought content normal.    Assessment and Plan  Nursing note and vitals reviewed.

## 2015-02-19 NOTE — Assessment & Plan Note (Signed)
Ventricularly paced rhythm, on anticoagulation Unable to afford eliquis as she is in the donut hole, samples provided

## 2015-02-19 NOTE — Patient Instructions (Addendum)
You are doing well. No medication changes were made.  Please call us if you have new issues that need to be addressed before your next appt.  Your physician wants you to follow-up in: 6 months.  You will receive a reminder letter in the mail two months in advance. If you don't receive a letter, please call our office to schedule the follow-up appointment.  *Pt provided w/ Eliquis 5 mg samples

## 2015-03-31 ENCOUNTER — Ambulatory Visit (INDEPENDENT_AMBULATORY_CARE_PROVIDER_SITE_OTHER): Payer: Medicare Other | Admitting: *Deleted

## 2015-03-31 DIAGNOSIS — I5032 Chronic diastolic (congestive) heart failure: Secondary | ICD-10-CM | POA: Diagnosis not present

## 2015-03-31 DIAGNOSIS — I4891 Unspecified atrial fibrillation: Secondary | ICD-10-CM

## 2015-03-31 NOTE — Progress Notes (Signed)
Remote pacemaker transmission.   

## 2015-04-01 LAB — CUP PACEART REMOTE DEVICE CHECK
Battery Voltage: 2.94 V
Brady Statistic AP VP Percent: 0 %
Brady Statistic RA Percent Paced: 0 %
Brady Statistic RV Percent Paced: 98.99 %
Date Time Interrogation Session: 20161024125029
Implantable Lead Implant Date: 20120106
Implantable Lead Model: 5076
Lead Channel Impedance Value: 1026 Ohm
Lead Channel Impedance Value: 4047 Ohm
Lead Channel Impedance Value: 551 Ohm
Lead Channel Impedance Value: 779 Ohm
Lead Channel Pacing Threshold Pulse Width: 0.4 ms
Lead Channel Pacing Threshold Pulse Width: 1 ms
Lead Channel Setting Pacing Amplitude: 2.75 V
Lead Channel Setting Pacing Pulse Width: 1 ms
MDC IDC LEAD IMPLANT DT: 20120106
MDC IDC LEAD LOCATION: 753858
MDC IDC LEAD LOCATION: 753860
MDC IDC MSMT BATTERY REMAINING LONGEVITY: 28 mo
MDC IDC MSMT LEADCHNL LV IMPEDANCE VALUE: 646 Ohm
MDC IDC MSMT LEADCHNL LV IMPEDANCE VALUE: 665 Ohm
MDC IDC MSMT LEADCHNL LV PACING THRESHOLD AMPLITUDE: 2 V
MDC IDC MSMT LEADCHNL RA IMPEDANCE VALUE: 4047 Ohm
MDC IDC MSMT LEADCHNL RV IMPEDANCE VALUE: 418 Ohm
MDC IDC MSMT LEADCHNL RV IMPEDANCE VALUE: 475 Ohm
MDC IDC MSMT LEADCHNL RV PACING THRESHOLD AMPLITUDE: 0.75 V
MDC IDC MSMT LEADCHNL RV SENSING INTR AMPL: 5.125 mV
MDC IDC MSMT LEADCHNL RV SENSING INTR AMPL: 5.125 mV
MDC IDC SET LEADCHNL RV PACING AMPLITUDE: 2 V
MDC IDC SET LEADCHNL RV PACING PULSEWIDTH: 0.4 ms
MDC IDC SET LEADCHNL RV SENSING SENSITIVITY: 0.9 mV
MDC IDC STAT BRADY AP VS PERCENT: 0 %
MDC IDC STAT BRADY AS VP PERCENT: 98.99 %
MDC IDC STAT BRADY AS VS PERCENT: 1.01 %

## 2015-04-02 ENCOUNTER — Encounter: Payer: Self-pay | Admitting: Cardiology

## 2015-04-23 ENCOUNTER — Encounter: Payer: Self-pay | Admitting: Internal Medicine

## 2015-04-24 ENCOUNTER — Telehealth: Payer: Self-pay

## 2015-04-24 NOTE — Telephone Encounter (Signed)
Spoke w/ pt.  Advised her that I am leaving samples of Eliquis 5 mg at the front desk for her to p/u at her convenience.  She is appreciative and states that her daughter will p/u tomorrow around lunchtime.

## 2015-04-24 NOTE — Telephone Encounter (Signed)
Pt would like Eliquis samples. OK to leave detailed msg if she's not there.

## 2015-05-27 ENCOUNTER — Ambulatory Visit (INDEPENDENT_AMBULATORY_CARE_PROVIDER_SITE_OTHER): Payer: Medicare Other | Admitting: Internal Medicine

## 2015-05-27 ENCOUNTER — Encounter: Payer: Self-pay | Admitting: Internal Medicine

## 2015-05-27 VITALS — BP 138/68 | HR 72 | Ht 62.0 in | Wt 208.0 lb

## 2015-05-27 DIAGNOSIS — I442 Atrioventricular block, complete: Secondary | ICD-10-CM

## 2015-05-27 DIAGNOSIS — R0789 Other chest pain: Secondary | ICD-10-CM

## 2015-05-27 DIAGNOSIS — I10 Essential (primary) hypertension: Secondary | ICD-10-CM

## 2015-05-27 DIAGNOSIS — R0602 Shortness of breath: Secondary | ICD-10-CM

## 2015-05-27 DIAGNOSIS — Z950 Presence of cardiac pacemaker: Secondary | ICD-10-CM

## 2015-05-27 DIAGNOSIS — I5032 Chronic diastolic (congestive) heart failure: Secondary | ICD-10-CM | POA: Diagnosis not present

## 2015-05-27 LAB — CUP PACEART INCLINIC DEVICE CHECK
Battery Voltage: 2.93 V
Brady Statistic AP VP Percent: 0 %
Brady Statistic AS VP Percent: 99.06 %
Brady Statistic RA Percent Paced: 0 %
Implantable Lead Implant Date: 20120106
Implantable Lead Location: 753860
Implantable Lead Model: 5076
Lead Channel Impedance Value: 1026 Ohm
Lead Channel Impedance Value: 4047 Ohm
Lead Channel Impedance Value: 4047 Ohm
Lead Channel Impedance Value: 437 Ohm
Lead Channel Impedance Value: 551 Ohm
Lead Channel Impedance Value: 646 Ohm
Lead Channel Impedance Value: 779 Ohm
Lead Channel Pacing Threshold Pulse Width: 0.4 ms
Lead Channel Setting Pacing Amplitude: 3.25 V
Lead Channel Setting Pacing Pulse Width: 1 ms
Lead Channel Setting Sensing Sensitivity: 0.9 mV
MDC IDC LEAD IMPLANT DT: 20120106
MDC IDC LEAD LOCATION: 753858
MDC IDC MSMT BATTERY REMAINING LONGEVITY: 31 mo
MDC IDC MSMT LEADCHNL LV IMPEDANCE VALUE: 684 Ohm
MDC IDC MSMT LEADCHNL LV PACING THRESHOLD AMPLITUDE: 1.25 V
MDC IDC MSMT LEADCHNL LV PACING THRESHOLD PULSEWIDTH: 1 ms
MDC IDC MSMT LEADCHNL RV IMPEDANCE VALUE: 475 Ohm
MDC IDC MSMT LEADCHNL RV PACING THRESHOLD AMPLITUDE: 0.75 V
MDC IDC MSMT LEADCHNL RV SENSING INTR AMPL: 5.125 mV
MDC IDC MSMT LEADCHNL RV SENSING INTR AMPL: 5.125 mV
MDC IDC SESS DTM: 20161220142034
MDC IDC SET LEADCHNL RV PACING AMPLITUDE: 2 V
MDC IDC SET LEADCHNL RV PACING PULSEWIDTH: 0.4 ms
MDC IDC STAT BRADY AP VS PERCENT: 0 %
MDC IDC STAT BRADY AS VS PERCENT: 0.94 %
MDC IDC STAT BRADY RV PERCENT PACED: 99.06 %

## 2015-05-27 NOTE — Patient Instructions (Signed)
Medication Instructions:  No medication changes today  Labwork: None   Testing/Procedures: None   Follow-Up: Your device remote check will be March 21,2017.  Your physician wants you to follow-up in: 1 year with Dr Caryl Comes. (December 2017).  You will receive a reminder letter in the mail two months in advance. If you don't receive a letter, please call our office to schedule the follow-up appointment.   Any Other Special Instructions Will Be Listed Below (If Applicable).     If you need a refill on your cardiac medications before your next appointment, please call your pharmacy.

## 2015-05-27 NOTE — Progress Notes (Signed)
Patient Care Team: [name], MD as PCP - General (Family Medicine)   HPI  Jacqueline Lozano is a 80 y.o. female seen in followup for pacemaker implantation for tachybradycardia syndrome. She has a history of permanent atrial fibrillation and status post CRT P. she is status post AV junction ablation She has chronic shortness of breath. She says is quite stable   She also had significant hypertension and amlodipine was added. Blood pressure is reasonably controlled.       Echo 314 demonstrated ejection fraction of 55-60% with mild LVH.  Past Medical History  Diagnosis Date  . Hypertension   . Hypothyroidism   . GERD (gastroesophageal reflux disease)   . Migraine   . OSA (obstructive sleep apnea)     Not consistently using CPAP  . Allergic to IV contrast   . PAD (peripheral artery disease) (HCC)     Occlusive disease involving left PT and AT  . Hyperlipidemia     Has been unable to take statins due to muscle pain. Has tried multiple statins per her report.  . Abnormal echocardiogram 09/2009    EF >55%, mild LVH, moderate LAE, normal RV, normal PA systolic pressure  . Paroxysmal atrial fibrillation (HCC)     Breakthrough a fib on sotalol, changed to amiodarone 4/11. DCCV 4/11  . Chronic diastolic CHF (congestive heart failure) ([place])   . Pacemaker-Medtronic CRT P. 11/23/2010    Atrial port was plugged   . Complete heart block-S./P. AV junction ablation 11/23/2010    Past Surgical History  Procedure Laterality Date  . Cardiac catheterization      Left heart cath x3 in teh past, last 5 yrs ago. All "normal" per pt report  . Pacemaker insertion  06/12/2010  . Atrial ablation surgery  06/12/2010  . Appendectomy    . Cholecystectomy open    . Vesicovaginal fistula closure w/ tah    . Carpal tunnel release    . Cardioversion      Current Outpatient Prescriptions  Medication Sig Dispense Refill  . ELIQUIS 5 MG TABS tablet TAKE ONE TABLET BY MOUTH TWICE DAILY 60  tablet 3  . famotidine (PEPCID) 10 MG tablet Take 10 mg by mouth daily as needed.     . furosemide (LASIX) 20 MG tablet TAKE ONE TABLET BY MOUTH TWICE DAILY  (Patient taking differently: TAKE ONE TABLET BY MOUTH DAILY) 180 tablet 3  . furosemide (LASIX) 40 MG tablet Take 1 tablet (40 mg total) by mouth 2 (two) times daily. (Patient taking differently: Take 40 mg by mouth as needed. ) 180 tablet 3  . GuaiFENesin (MUCINEX PO) Take by mouth as needed.    Marland Kitchen levothyroxine (SYNTHROID, LEVOTHROID) 112 MCG tablet Take 112 mcg by mouth daily before breakfast.    . meclizine (ANTIVERT) 25 MG tablet Take 1 tablet (25 mg total) by mouth 3 (three) times daily as needed. 90 tablet 1  . metoprolol (LOPRESSOR) 50 MG tablet Take 1.5 tablets (75 mg total) by mouth 2 (two) times daily. 270 tablet 3  . nitroGLYCERIN (NITROSTAT) 0.4 MG SL tablet Place 1 tablet (0.4 mg total) under the tongue every 5 (five) minutes as needed for chest pain. 25 tablet 3  . potassium chloride (K-DUR,KLOR-CON) 10 MEQ tablet TAKE ONE TABLET BY MOUTH ONE TIME DAILY  90 tablet 3  . prochlorperazine (COMPAZINE) 5 MG tablet Take 1 tablet (5 mg total) by mouth every 6 (six) hours as needed. 90 tablet 5  .  VITAMIN B1-B12 IM Inject into the muscle every 30 (thirty) days.    Marland Kitchen VITAMIN D, CHOLECALCIFEROL, PO Take 5,000 Units by mouth 3 (three) times a week.     No current facility-administered medications for this visit.    Allergies  Allergen Reactions  . Ivp Dye [Iodinated Diagnostic Agents] Shortness Of Breath  . Codeine   . Iodine   . Morphine   . Oxycodone-Acetaminophen   . Persantine [Dipyridamole]   . Statins   . Sulfonamide Derivatives   . Amlodipine Rash    Review of Systems negative except from HPI and PMH  Physical Exam BP 138/68 mmHg  Pulse 72  Ht 5\' 2"  (1.575 m)  Wt 208 lb (94.348 kg)  BMI 38.03 kg/m2 Well developed and Morbidly obese  in no acute distress HENT normal E scleral and icterus clear Neck Supple JVP  flat; carotids brisk and full Clear to ausculation Device pocket well healed; without hematoma or erythema.  There is no tethering *Regular rate and rhythm, no murmurs gallops or rub Soft with active bowel sounds No clubbing cyanosis no Edema Alert and oriented, grossly normal motor and sensory function Skin Warm and Dry erythematous facial rash   ECG was ordered today demonstrated atrial fibrillation and biventricular pacing with a QRS morphology that is negative in lead V1 Assessment and  Plan  HFpEF  Atrial fibrillation-permanent  AV junction ablation-complete heart block  Pacemaker-CRT  The patient's device was interrogated.  The information was reviewed. Changes were made as noted below  Hypertension  Her blood pressure is relatively well controlled.  Her dyspnea is chronic and stable.   She is euvolemic. We will continue her on her current dose of diuretics.  Her device was reprogrammed to low LV pacing threshold; this will be adjusted.

## 2015-06-12 ENCOUNTER — Encounter: Payer: Medicare Other | Admitting: Internal Medicine

## 2015-06-16 ENCOUNTER — Other Ambulatory Visit: Payer: Self-pay | Admitting: *Deleted

## 2015-06-16 MED ORDER — METOPROLOL TARTRATE 50 MG PO TABS: 75.0000 mg | ORAL_TABLET | Freq: Two times a day (BID) | ORAL | Status: DC

## 2015-06-16 MED ORDER — POTASSIUM CHLORIDE CRYS ER 10 MEQ PO TBCR: 10.0000 meq | EXTENDED_RELEASE_TABLET | Freq: Every day | ORAL | Status: DC

## 2015-07-04 ENCOUNTER — Encounter: Payer: Self-pay | Admitting: Internal Medicine

## 2015-08-19 ENCOUNTER — Ambulatory Visit (INDEPENDENT_AMBULATORY_CARE_PROVIDER_SITE_OTHER): Payer: Medicare Other | Admitting: Cardiovascular Disease

## 2015-08-19 ENCOUNTER — Encounter: Payer: Self-pay | Admitting: Cardiovascular Disease

## 2015-08-19 VITALS — BP 130/70 | HR 78 | Ht 62.0 in | Wt 209.0 lb

## 2015-08-19 DIAGNOSIS — I4891 Unspecified atrial fibrillation: Secondary | ICD-10-CM | POA: Diagnosis not present

## 2015-08-19 DIAGNOSIS — I10 Essential (primary) hypertension: Secondary | ICD-10-CM

## 2015-08-19 DIAGNOSIS — I5032 Chronic diastolic (congestive) heart failure: Secondary | ICD-10-CM

## 2015-08-19 DIAGNOSIS — E785 Hyperlipidemia, unspecified: Secondary | ICD-10-CM

## 2015-08-19 DIAGNOSIS — I442 Atrioventricular block, complete: Secondary | ICD-10-CM | POA: Diagnosis not present

## 2015-08-19 DIAGNOSIS — Z01810 Encounter for preprocedural cardiovascular examination: Secondary | ICD-10-CM | POA: Insufficient documentation

## 2015-08-19 DIAGNOSIS — R0602 Shortness of breath: Secondary | ICD-10-CM

## 2015-08-19 NOTE — Patient Instructions (Addendum)
You are doing well.  Please hold the eliquis for 2 days prior to the colonoscopy Restart 24 hours after the procedure  Avoid excessive fluid intake  Please call us if you have new issues that need to be addressed before your next appt.  Your physician wants you to follow-up in: 6 months.  You will receive a reminder letter in the mail two months in advance. If you don't receive a letter, please call our office to schedule the follow-up appointment.

## 2015-08-19 NOTE — Assessment & Plan Note (Signed)
Heart rate well controlled, tolerating anticoagulation 

## 2015-08-19 NOTE — Assessment & Plan Note (Signed)
Acceptable risk for upcoming colonoscopy. Recommended she stop anticoagulation 2 days prior to the procedure Long discussion concerning propofol versus other anesthesia such as Versed and fentanyl Recommended she discuss this with her team the morning of the procedure Also can likely meet the doctor the morning of the procedure as she is very nervous   Total encounter time more than 25 minutes  Greater than 50% was spent in counseling and coordination of care with the patient

## 2015-08-19 NOTE — Assessment & Plan Note (Signed)
History of AV junction ablation, paced rhythm Likely contributing to some of her shortness of breath

## 2015-08-19 NOTE — Assessment & Plan Note (Signed)
Blood pressure is well controlled on today's visit. No changes made to the medications. 

## 2015-08-19 NOTE — Assessment & Plan Note (Signed)
Chronic shortness of breath, sedentary, obese, very deconditioned Possible exacerbation of her symptoms by paced rhythm No further workup needed at this time

## 2015-08-19 NOTE — Progress Notes (Signed)
Patient ID: Jacqueline Lozano, female    DOB: 1935-05-09, 81 y.o.   MRN: [id number]  HPI Comments: Jacqueline Lozano is a pleasant 88 -year-old woman with morbid obesity,  atrial fibrillation, status post arteriovenous node ablation, Pacer/CRT-P, Hypertension, Paroxysmal nocturnal dyspnea, Hypothyroidism, Diastolic congestive heart failure, C. difficile infection, who presents for followup of her shortness of breath. Previous pacemaker interrogation shows that she is  ventricularly paced, permanent atrial fibrillation Followed by Dr. Caryl Comes  In follow-up today, she reports that she is doing well We spent most of her visit discussing her pending EGD and colonoscopy She will stop anticoagulation 2 days prior to the procedure scheduled for April 3, restart 24 hours after the procedure She is concerned as she is not met the doctor doing the procedure, nurses did not call her back to answer her questions  Recent lab work showing creatinine 1.3 She was told by primary care to increase her fluid intake, but she is concerned as this makes her swelling and breathing worse Typically takes Lasix 20 mg daily, sometimes 20 mg twice a day  Other lab work reviewed TSH 0.075, thyroid dosing was decreased one month ago She did report having occasional palpitations, rare Very sedentary, no regular exercise, weight continues to be a problem  [facility] echocardiogram reviewed with her showing normal LV function, mild to moderate aortic valve stenosis February 2016  EKG on today's visit shows   atrial fibrillation, ventricular paced rhythm, rate 78 bpm Eliquis samples provided today  Other past medical history unable to participate in pulmonary rehabilitation secondary to chronic knee pain, osteoarthritis. Weight and conditioning is a major issue.  echocardiogram shows normal LV function, stable valve disease, normal right ventricular systolic pressures.  Previous visit to the emergency room for atypical chest  pain. Cardiac enzymes were negative and she was discharged home Prior workup with echocardiogram in 2014 and stress test were unrevealing.   sleep study in October and again in November. Sleep study in October suggested she repeat the test as it was incomplete. The note indicates the study was discontinued as the patient had heart block, with limited EKG monitoring appearing to demonstrate a 43 second period of complete heart block with ventricular standstill. Atrial rate was 80 beats per minute. Note indicates the patient was awake and alert during the event with some dizziness. Pacer was checked after this event,  Normal functioning device.  Followup sleep study 04/24/2013 recommended weight loss, though there was no indications for CPAP at this time. Previous stress test  early 2014 showed no ischemia        Allergies  Allergen Reactions  . Ivp Dye [Iodinated Diagnostic Agents] Shortness Of Breath  . Codeine   . Iodine   . Morphine   . Oxycodone-Acetaminophen   . Persantine [Dipyridamole]   . Statins   . Sulfonamide Derivatives   . Amlodipine Rash    Outpatient Encounter Prescriptions as of 08/19/2015  Medication Sig  . ELIQUIS 5 MG TABS tablet TAKE ONE TABLET BY MOUTH TWICE DAILY  . famotidine (PEPCID) 10 MG tablet Take 10 mg by mouth daily as needed. Reported on 08/19/2015  . furosemide (LASIX) 20 MG tablet TAKE ONE TABLET BY MOUTH TWICE DAILY  (Patient taking differently: TAKE ONE TABLET BY MOUTH DAILY)  . furosemide (LASIX) 40 MG tablet Take 1 tablet (40 mg total) by mouth 2 (two) times daily. (Patient taking differently: Take 40 mg by mouth as needed. )  . GuaiFENesin (MUCINEX PO) Take by mouth  as needed.  Marland Kitchen levothyroxine (SYNTHROID, LEVOTHROID) 100 MCG tablet Take 100 mcg by mouth daily before breakfast.  . metoprolol (LOPRESSOR) 50 MG tablet Take 1.5 tablets (75 mg total) by mouth 2 (two) times daily.  . nitroGLYCERIN (NITROSTAT) 0.4 MG SL tablet Place 1 tablet (0.4 mg  total) under the tongue every 5 (five) minutes as needed for chest pain.  . potassium chloride (K-DUR,KLOR-CON) 10 MEQ tablet Take 1 tablet (10 mEq total) by mouth daily.  . prochlorperazine (COMPAZINE) 5 MG tablet Take 1 tablet (5 mg total) by mouth every 6 (six) hours as needed.  Marland Kitchen VITAMIN B1-B12 IM Inject into the muscle every 30 (thirty) days.  . meclizine (ANTIVERT) 25 MG tablet Take 1 tablet (25 mg total) by mouth 3 (three) times daily as needed.  . [DISCONTINUED] levothyroxine (SYNTHROID, LEVOTHROID) 112 MCG tablet Take 112 mcg by mouth daily before breakfast. Reported on 08/19/2015  . [DISCONTINUED] VITAMIN D, CHOLECALCIFEROL, PO Take 5,000 Units by mouth 3 (three) times a week. Reported on 08/19/2015   No facility-administered encounter medications on file as of 08/19/2015.    Past Medical History  Diagnosis Date  . Hypertension   . Hypothyroidism   . GERD (gastroesophageal reflux disease)   . Migraine   . OSA (obstructive sleep apnea)     Not consistently using CPAP  . Allergic to IV contrast   . PAD (peripheral artery disease) (HCC)     Occlusive disease involving left PT and AT  . Hyperlipidemia     Has been unable to take statins due to muscle pain. Has tried multiple statins per her report.  . Abnormal echocardiogram 09/2009    EF >55%, mild LVH, moderate LAE, normal RV, normal PA systolic pressure  . Paroxysmal atrial fibrillation (HCC)     Breakthrough a fib on sotalol, changed to amiodarone 4/11. DCCV 4/11  . Chronic diastolic CHF (congestive heart failure) ([place])   . Pacemaker-Medtronic CRT P. 11/23/2010    Atrial port was plugged   . Complete heart block-S./P. AV junction ablation 11/23/2010    Past Surgical History  Procedure Laterality Date  . Cardiac catheterization      Left heart cath x3 in teh past, last 5 yrs ago. All "normal" per pt report  . Pacemaker insertion  06/12/2010  . Atrial ablation surgery  06/12/2010  . Appendectomy    . Cholecystectomy open     . Vesicovaginal fistula closure w/ tah    . Carpal tunnel release    . Cardioversion      Social History  reports that she has never smoked. She has never used smokeless tobacco. She reports that she does not drink alcohol or use illicit drugs.  Family History family history includes Colon cancer in her daughter; Heart attack in her father and mother. There is no history of Stroke.  Review of Systems  Constitutional: Negative.   Respiratory: Positive for shortness of breath.   Cardiovascular: Negative.   Gastrointestinal: Negative.   Musculoskeletal: Negative.   Skin: Negative.   Neurological: Negative.   Hematological: Negative.   Psychiatric/Behavioral: Negative.   All other systems reviewed and are negative.   BP 130/70 mmHg  Pulse 78  Ht '5\' 2"'  (1.575 m)  Wt 209 lb (94.802 kg)  BMI 38.22 kg/m2  Physical Exam  Constitutional: She is oriented to person, place, and time. She appears well-developed and well-nourished.  Obese  HENT:  Head: Normocephalic.  Nose: Nose normal.  Mouth/Throat: Oropharynx is clear and moist.  Eyes: Conjunctivae are normal. Pupils are equal, round, and reactive to light.  Neck: Normal range of motion. Neck supple. No JVD present.  Cardiovascular: Normal rate, regular rhythm, S1 normal, S2 normal, normal heart sounds and intact distal pulses.  Exam reveals no gallop and no friction rub.   No murmur heard. Pulmonary/Chest: Effort normal and breath sounds normal. No respiratory distress. She has no wheezes. She has no rales. She exhibits no tenderness.  Abdominal: Soft. Bowel sounds are normal. She exhibits no distension. There is no tenderness.  Musculoskeletal: Normal range of motion. She exhibits no edema or tenderness.  Lymphadenopathy:    She has no cervical adenopathy.  Neurological: She is alert and oriented to person, place, and time. Coordination normal.  Skin: Skin is warm and dry. No rash noted. No erythema.  Psychiatric: She has a  normal mood and affect. Her behavior is normal. Judgment and thought content normal.    Assessment and Plan  Nursing note and vitals reviewed.

## 2015-08-19 NOTE — Assessment & Plan Note (Signed)
Recommend she continue on Lasix 20 mg up to 40 mg daily, avoid high fluid intake Creatinine 1.3, likely a good range for her

## 2015-08-23 ENCOUNTER — Other Ambulatory Visit: Payer: Self-pay | Admitting: Cardiovascular Disease

## 2015-08-25 ENCOUNTER — Telehealth: Payer: Self-pay | Admitting: Internal Medicine

## 2015-08-25 NOTE — Telephone Encounter (Signed)
Received cardiac clearance request from [facility] for April 3 EGD. Per March 14 OV notes  "Acceptable risk for upcoming colonoscopy. Recommended she stop anticoagulation 2 days prior to the procedure Long discussion concerning propofol versus other anesthesia such as Versed and fentanyl Recommended she discuss this with her team the morning of the procedure"  Clearance placed in Heather's in basket for MD signature Tuesday.

## 2015-08-26 ENCOUNTER — Telehealth: Payer: Self-pay | Admitting: Cardiology

## 2015-08-26 ENCOUNTER — Ambulatory Visit (INDEPENDENT_AMBULATORY_CARE_PROVIDER_SITE_OTHER): Payer: Medicare Other | Admitting: *Deleted

## 2015-08-26 DIAGNOSIS — I5032 Chronic diastolic (congestive) heart failure: Secondary | ICD-10-CM | POA: Diagnosis not present

## 2015-08-26 DIAGNOSIS — I442 Atrioventricular block, complete: Secondary | ICD-10-CM

## 2015-08-26 DIAGNOSIS — Z950 Presence of cardiac pacemaker: Secondary | ICD-10-CM

## 2015-08-26 NOTE — Telephone Encounter (Signed)
Per Dr. Caryl Comes & Dr. Rockey Situ, pt may hold Eliquis x 3 days prior to colonoscopy & upper endoscopy. Signed clearance faxed to [facility] @ [phone].

## 2015-08-26 NOTE — Telephone Encounter (Signed)
Spoke with pt and reminded pt of remote transmission that is due today. Pt verbalized understanding.   

## 2015-08-27 NOTE — Progress Notes (Signed)
Remote pacemaker transmission.   

## 2015-09-05 ENCOUNTER — Encounter: Payer: Self-pay | Admitting: *Deleted

## 2015-09-08 ENCOUNTER — Ambulatory Visit: Payer: Medicare Other | Admitting: Anesthesiology

## 2015-09-08 ENCOUNTER — Encounter
Admission: RE | Disposition: A | Discharge status: Home / Self Care | Payer: Self-pay | Source: Ambulatory Visit | Attending: Gastroenterology

## 2015-09-08 ENCOUNTER — Ambulatory Visit
Admission: RE | Admit: 2015-09-08 | Discharge: 2015-09-08 | Disposition: A | Discharge status: Home / Self Care | Payer: Medicare Other | Source: Ambulatory Visit | Attending: Gastroenterology | Admitting: Gastroenterology

## 2015-09-08 DIAGNOSIS — I5032 Chronic diastolic (congestive) heart failure: Secondary | ICD-10-CM | POA: Insufficient documentation

## 2015-09-08 DIAGNOSIS — K269 Duodenal ulcer, unspecified as acute or chronic, without hemorrhage or perforation: Secondary | ICD-10-CM | POA: Diagnosis not present

## 2015-09-08 DIAGNOSIS — Z91013 Allergy to seafood: Secondary | ICD-10-CM | POA: Diagnosis not present

## 2015-09-08 DIAGNOSIS — I480 Paroxysmal atrial fibrillation: Secondary | ICD-10-CM | POA: Diagnosis not present

## 2015-09-08 DIAGNOSIS — K219 Gastro-esophageal reflux disease without esophagitis: Secondary | ICD-10-CM | POA: Insufficient documentation

## 2015-09-08 DIAGNOSIS — D125 Benign neoplasm of sigmoid colon: Secondary | ICD-10-CM | POA: Diagnosis not present

## 2015-09-08 DIAGNOSIS — Z950 Presence of cardiac pacemaker: Secondary | ICD-10-CM | POA: Insufficient documentation

## 2015-09-08 DIAGNOSIS — Z7901 Long term (current) use of anticoagulants: Secondary | ICD-10-CM | POA: Insufficient documentation

## 2015-09-08 DIAGNOSIS — I442 Atrioventricular block, complete: Secondary | ICD-10-CM | POA: Insufficient documentation

## 2015-09-08 DIAGNOSIS — G43909 Migraine, unspecified, not intractable, without status migrainosus: Secondary | ICD-10-CM | POA: Insufficient documentation

## 2015-09-08 DIAGNOSIS — Z8249 Family history of ischemic heart disease and other diseases of the circulatory system: Secondary | ICD-10-CM | POA: Insufficient documentation

## 2015-09-08 DIAGNOSIS — K449 Diaphragmatic hernia without obstruction or gangrene: Secondary | ICD-10-CM | POA: Insufficient documentation

## 2015-09-08 DIAGNOSIS — E785 Hyperlipidemia, unspecified: Secondary | ICD-10-CM | POA: Insufficient documentation

## 2015-09-08 DIAGNOSIS — Z8719 Personal history of other diseases of the digestive system: Secondary | ICD-10-CM | POA: Diagnosis present

## 2015-09-08 DIAGNOSIS — Z885 Allergy status to narcotic agent status: Secondary | ICD-10-CM | POA: Diagnosis not present

## 2015-09-08 DIAGNOSIS — Z882 Allergy status to sulfonamides status: Secondary | ICD-10-CM | POA: Diagnosis not present

## 2015-09-08 DIAGNOSIS — Z800 Family history of malignant neoplasm of digestive organs: Secondary | ICD-10-CM | POA: Diagnosis not present

## 2015-09-08 DIAGNOSIS — K2980 Duodenitis without bleeding: Secondary | ICD-10-CM | POA: Insufficient documentation

## 2015-09-08 DIAGNOSIS — D123 Benign neoplasm of transverse colon: Secondary | ICD-10-CM | POA: Insufficient documentation

## 2015-09-08 DIAGNOSIS — K3189 Other diseases of stomach and duodenum: Secondary | ICD-10-CM | POA: Insufficient documentation

## 2015-09-08 DIAGNOSIS — K529 Noninfective gastroenteritis and colitis, unspecified: Secondary | ICD-10-CM | POA: Insufficient documentation

## 2015-09-08 DIAGNOSIS — K644 Residual hemorrhoidal skin tags: Secondary | ICD-10-CM | POA: Diagnosis not present

## 2015-09-08 DIAGNOSIS — Z79899 Other long term (current) drug therapy: Secondary | ICD-10-CM | POA: Insufficient documentation

## 2015-09-08 DIAGNOSIS — E039 Hypothyroidism, unspecified: Secondary | ICD-10-CM | POA: Insufficient documentation

## 2015-09-08 DIAGNOSIS — Z823 Family history of stroke: Secondary | ICD-10-CM | POA: Diagnosis not present

## 2015-09-08 DIAGNOSIS — Z888 Allergy status to other drugs, medicaments and biological substances status: Secondary | ICD-10-CM | POA: Insufficient documentation

## 2015-09-08 DIAGNOSIS — K2270 Barrett's esophagus without dysplasia: Secondary | ICD-10-CM | POA: Insufficient documentation

## 2015-09-08 DIAGNOSIS — Z91041 Radiographic dye allergy status: Secondary | ICD-10-CM | POA: Insufficient documentation

## 2015-09-08 DIAGNOSIS — D122 Benign neoplasm of ascending colon: Secondary | ICD-10-CM | POA: Insufficient documentation

## 2015-09-08 DIAGNOSIS — R1310 Dysphagia, unspecified: Secondary | ICD-10-CM | POA: Insufficient documentation

## 2015-09-08 DIAGNOSIS — I10 Essential (primary) hypertension: Secondary | ICD-10-CM | POA: Diagnosis not present

## 2015-09-08 DIAGNOSIS — R197 Diarrhea, unspecified: Secondary | ICD-10-CM | POA: Diagnosis present

## 2015-09-08 DIAGNOSIS — G4733 Obstructive sleep apnea (adult) (pediatric): Secondary | ICD-10-CM | POA: Insufficient documentation

## 2015-09-08 HISTORY — PX: ESOPHAGOGASTRODUODENOSCOPY (EGD) WITH PROPOFOL: SHX5813

## 2015-09-08 HISTORY — PX: COLONOSCOPY WITH PROPOFOL: SHX5780

## 2015-09-08 SURGERY — COLONOSCOPY WITH PROPOFOL
Anesthesia: General

## 2015-09-08 MED ORDER — SODIUM CHLORIDE 0.9 % IV SOLN
INTRAVENOUS | Status: DC
  Administered 2015-09-08: 1000 mL via INTRAVENOUS

## 2015-09-08 MED ORDER — PROPOFOL 10 MG/ML IV BOLUS
INTRAVENOUS | Status: DC | PRN
  Administered 2015-09-08 (×2): 20 mg via INTRAVENOUS
  Administered 2015-09-08: 30 mg via INTRAVENOUS
  Administered 2015-09-08: 20 mg via INTRAVENOUS
  Administered 2015-09-08: 10 mg via INTRAVENOUS
  Administered 2015-09-08 (×4): 20 mg via INTRAVENOUS
  Administered 2015-09-08: 10 mg via INTRAVENOUS
  Administered 2015-09-08 (×4): 20 mg via INTRAVENOUS
  Administered 2015-09-08: 30 mg via INTRAVENOUS
  Administered 2015-09-08 (×2): 20 mg via INTRAVENOUS
  Administered 2015-09-08: 10 mg via INTRAVENOUS
  Administered 2015-09-08 (×2): 20 mg via INTRAVENOUS
  Administered 2015-09-08: 30 mg via INTRAVENOUS

## 2015-09-08 NOTE — Op Note (Signed)
[facility] Gastroenterology Patient Name: Jacqueline Lozano Procedure Date: 09/08/2015 1:12 PM MRN: [id number] Account #: [id number] Date of Birth: 11/03/1934 Admit Type: Outpatient Age: 81 Room: [facility] ENDO ROOM 3 Gender: Female Note Status: Finalized Procedure:            Colonoscopy Indications:          Last colonoscopy: 2005, Chronic diarrhea Patient Profile:      This is a 81 year old female. Providers:            Gerrit Heck. Rayann Heman, MD Referring MD:         Minna Merritts, MD (Referring MD), Irven Easterly. Kary Kos,                        MD (Referring MD) Medicines:            Propofol per Anesthesia Complications:        No immediate complications. Procedure:            Pre-Anesthesia Assessment:                       - Prior to the procedure, a History and Physical was                        performed, and patient medications, allergies and                        sensitivities were reviewed. The patient's tolerance of                        previous anesthesia was reviewed.                       After obtaining informed consent, the colonoscope was                        passed under direct vision. Throughout the procedure,                        the patient's blood pressure, pulse, and oxygen                        saturations were monitored continuously. The                        Colonoscope was introduced through the anus and                        advanced to the the terminal ileum. The colonoscopy was                        performed without difficulty. The patient tolerated the                        procedure well. The quality of the bowel preparation                        was good. Findings:      The perianal exam findings include non-thrombosed external hemorrhoids.      A 7 mm polyp was found in the proximal ascending colon. The polyp was  sessile. The polyp was removed with a hot snare. Resection and retrieval       were complete.      A 14 mm  polyp was found in the mid ascending colon. The polyp was flat.       The polyp was removed with a saline injection-lift technique using a hot       snare. The polyp was removed with a piecemeal technique using a hot       snare. Resection and retrieval were complete.      Five sessile polyps were found in the transverse colon. The polyps were       3 to 6 mm in size. These polyps were removed with a cold snare.       Resection and retrieval were complete.      A 6 mm polyp was found in the sigmoid colon. The polyp was sessile. The       polyp was removed with a hot snare. Resection and retrieval were       complete.      The exam was otherwise without abnormality on direct and retroflexion       views.      The terminal ileum appeared normal.      Biopsies for histology were taken with a cold forceps from the right       colon, left colon and rectum for evaluation of microscopic colitis. Impression:           - Non-thrombosed external hemorrhoids found on perianal                        exam.                       - One 7 mm polyp in the proximal ascending colon,                        removed with a hot snare. Resected and retrieved.                       - One 14 mm polyp in the mid ascending colon, removed                        using injection-lift and a hot snare and removed                        piecemeal using a hot snare. Resected and retrieved.                        PIECEMEAL.                       - Five 3 to 6 mm polyps in the transverse colon,                        removed with a cold snare and cold biopsy as                        appropriate. Resected and retrieved.                       - One 6 mm polyp in the sigmoid colon, removed with a  hot snare. Resected and retrieved.                       - The examination was otherwise normal on direct and                        retroflexion views.                       - The examined portion of the  ileum was normal.                       - Biopsies were taken with a cold forceps from the                        right colon, left colon and rectum for evaluation of                        microscopic colitis. Recommendation:       - Observe patient in GI recovery unit.                       - Resume regular diet.                       - Continue present medications.                       - Await pathology results.                       - Repeat colonoscopy in 1 year for surveillance after                        piecemeal polypectomy.                       - Return to referring physician.                       - Restart elaquis tomorrow                       - The findings and recommendations were discussed with                        the patient.                       - The findings and recommendations were discussed with                        the patient's family. Procedure Code(s):    --- Professional ---                       [phone], Colonoscopy, flexible; with removal of tumor(s),                        polyp(s), or other lesion(s) by snare technique                       45381, Colonoscopy, flexible; with directed submucosal  injection(s), any substance                       45380, 59, Colonoscopy, flexible; with biopsy, single                        or multiple Diagnosis Code(s):    --- Professional ---                       D12.2, Benign neoplasm of ascending colon                       D12.5, Benign neoplasm of sigmoid colon                       D12.3, Benign neoplasm of transverse colon (hepatic                        flexure or splenic flexure)                       K64.4, Residual hemorrhoidal skin tags                       K52.9, Noninfective gastroenteritis and colitis,                        unspecified CPT copyright 2016 American Medical Association. All rights reserved. The codes documented in this report are preliminary and upon coder review  may  be revised to meet current compliance requirements. Mellody Life, MD 09/08/2015 2:38:44 PM This report has been signed electronically. Number of Addenda: 0 Note Initiated On: 09/08/2015 1:12 PM Scope Withdrawal Time: 0 hours 41 minutes 36 seconds  Total Procedure Duration: 0 hours 42 minutes 35 seconds       [facility]

## 2015-09-08 NOTE — Discharge Instructions (Signed)
Restart Elaquis tomorrow.     YOU HAD AN ENDOSCOPIC PROCEDURE TODAY: Refer to the procedure report that was given to you for any specific questions about what was found during the examination.  If the procedure report does not answer your questions, please call your gastroenterologist to clarify.  YOU SHOULD EXPECT: Some feelings of bloating in the abdomen. Passage of more gas than usual.  Walking can help get rid of the air that was put into your GI tract during the procedure and reduce the bloating. If you had a lower endoscopy (such as a colonoscopy or flexible sigmoidoscopy) you may notice spotting of blood in your stool or on the toilet paper.   DIET: Your first meal following the procedure should be a light meal and then it is ok to progress to your normal diet.  A half-sandwich or bowl of soup is an example of a good first meal.  Heavy or fried foods are harder to digest and may make you feel nasueas or bloated.  Drink plenty of fluids but you should avoid alcoholic beverages for 24 hours.  ACTIVITY: Your care partner should take you home directly after the procedure.  You should plan to take it easy, moving slowly for the rest of the day.  You can resume normal activity the day after the procedure however you should NOT DRIVE or use heavy machinery for 24 hours (because of the sedation medicines used during the test).    SYMPTOMS TO REPORT IMMEDIATELY  A gastroenterologist can be reached at any hour.  Please call your doctor's office for any of the following symptoms:   Following lower endoscopy (colonoscopy, flexible sigmoidoscopy)  Excessive amounts of blood in the stool  Significant tenderness, worsening of abdominal pains  Swelling of the abdomen that is new, acute  Fever of 100 or higher  Following upper endoscopy (EGD, EUS, ERCP)  Vomiting of blood or coffee ground material  New, significant abdominal pain  New, significant chest pain or pain under the shoulder  blades  Painful or persistently difficult swallowing  New shortness of breath  Black, tarry-looking stools  FOLLOW UP: If any biopsies were taken you will be contacted by phone or by letter within the next 1-3 weeks.  Call your gastroenterologist if you have not heard about the biopsies in 3 weeks.  Please also call your gastroenterologist's office with any specific questions about appointments or follow up tests.

## 2015-09-08 NOTE — Anesthesia Preprocedure Evaluation (Signed)
Anesthesia Evaluation  Patient identified by MRN, date of birth, ID band Patient awake    Reviewed: Allergy & Precautions, H&P , NPO status , Patient's Chart, lab work & pertinent test results, reviewed documented beta blocker date and time   Airway Mallampati: III   Neck ROM: full    Dental  (+) Teeth Intact   Pulmonary neg pulmonary ROS, shortness of breath, with exertion and at rest, sleep apnea ,    Pulmonary exam normal        Cardiovascular hypertension, On Medications + Peripheral Vascular Disease and +CHF  negative cardio ROS Normal cardiovascular exam+ dysrhythmias Atrial Fibrillation + pacemaker      Neuro/Psych  Headaches, negative neurological ROS  negative psych ROS   GI/Hepatic negative GI ROS, Neg liver ROS, GERD  ,  Endo/Other  negative endocrine ROSHypothyroidism   Renal/GU negative Renal ROS  negative genitourinary   Musculoskeletal   Abdominal   Peds  Hematology negative hematology ROS (+)   Anesthesia Other Findings Past Medical History:   Hypertension                                                 Hypothyroidism                                               GERD (gastroesophageal reflux disease)                       Migraine                                                     OSA (obstructive sleep apnea)                                  Comment:Not consistently using CPAP   Allergic to IV contrast                                      PAD (peripheral artery disease) (HCC)                          Comment:Occlusive disease involving left PT and AT   Hyperlipidemia                                                 Comment:Has been unable to take statins due to muscle               pain. Has tried multiple statins per her               report.   Abnormal echocardiogram                         09/2009  Comment:EF >55%, mild LVH, moderate LAE, normal RV,               normal PA systolic  pressure   Paroxysmal atrial fibrillation (HCC)                           Comment:Breakthrough a fib on sotalol, changed to               amiodarone 4/11. DCCV 4/11   Chronic diastolic CHF (congestive heart failur*              Pacemaker-Medtronic CRT P.                      11/23/2010      Comment:Atrial port was plugged    Complete heart block-S./P. AV junction ablation 11/23/2010  Past Surgical History:   CARDIAC CATHETERIZATION                                         Comment:Left heart cath x3 in teh past, last 5 yrs ago.              All "normal" per pt report   PACEMAKER INSERTION                              06/12/2010   ATRIAL ABLATION SURGERY                          06/12/2010   APPENDECTOMY                                                  CHOLECYSTECTOMY OPEN                                          VESICOVAGINAL FISTULA CLOSURE W/ TAH                          CARPAL TUNNEL RELEASE                                         CARDIOVERSION                                                 Reproductive/Obstetrics                             Anesthesia Physical Anesthesia Plan  ASA: III  Anesthesia Plan: General   Post-op Pain Management:    Induction:   Airway Management Planned:   Additional Equipment:   Intra-op Plan:   Post-operative Plan:   Informed Consent: I have reviewed the patients History and Physical, chart, labs and discussed the procedure including the risks, benefits and alternatives for the  proposed anesthesia with the patient or authorized representative who has indicated his/her understanding and acceptance.   Dental Advisory Given  Plan Discussed with: CRNA  Anesthesia Plan Comments:         Anesthesia Quick Evaluation

## 2015-09-08 NOTE — Transfer of Care (Signed)
Immediate Anesthesia Transfer of Care Note  Patient: Jacqueline Lozano  Procedure(s) Performed: Procedure(s): COLONOSCOPY WITH PROPOFOL (N/A) ESOPHAGOGASTRODUODENOSCOPY (EGD) WITH PROPOFOL (N/A)  Patient Location: PACU and Endoscopy Unit  Anesthesia Type:General  Level of Consciousness: awake and alert   Airway & Oxygen Therapy: Patient Spontanous Breathing and Patient connected to nasal cannula oxygen  Post-op Assessment: Report given to RN and Post -op Vital signs reviewed and stable  Post vital signs: Reviewed  Last Vitals:  Filed Vitals:   09/08/15 1250  Pulse: 70  Temp: 36.1 C  Resp: 20    Complications: No apparent anesthesia complications

## 2015-09-08 NOTE — Anesthesia Postprocedure Evaluation (Signed)
Anesthesia Post Note  Patient: Jacqueline Lozano  Procedure(s) Performed: Procedure(s) (LRB): COLONOSCOPY WITH PROPOFOL (N/A) ESOPHAGOGASTRODUODENOSCOPY (EGD) WITH PROPOFOL (N/A)  Patient location during evaluation: PACU Anesthesia Type: General Level of consciousness: awake and alert Pain management: pain level controlled Vital Signs Assessment: post-procedure vital signs reviewed and stable Respiratory status: spontaneous breathing, nonlabored ventilation, respiratory function stable and patient connected to nasal cannula oxygen Cardiovascular status: blood pressure returned to baseline and stable Postop Assessment: no signs of nausea or vomiting Anesthetic complications: no    Last Vitals:  Filed Vitals:   09/08/15 1250  Pulse: 70  Temp: 36.1 C  Resp: 20    Last Pain: There were no vitals filed for this visit.               Molli Barrows

## 2015-09-08 NOTE — Procedures (Signed)
DURING COLONOSCOPY POLYPS IN ASCENDING COLON  ARE LIFTED WITH NORMAL SALINE X 30 ML.OF SALINE.

## 2015-09-08 NOTE — H&P (Signed)
Primary Care Physician:  [name], MD  Pre-Procedure History & Physical: HPI:  Jacqueline Lozano is a 81 y.o. female is here for an endoscopy/colonoscopy   Past Medical History  Diagnosis Date  . Hypertension   . Hypothyroidism   . GERD (gastroesophageal reflux disease)   . Migraine   . OSA (obstructive sleep apnea)     Not consistently using CPAP  . Allergic to IV contrast   . PAD (peripheral artery disease) (HCC)     Occlusive disease involving left PT and AT  . Hyperlipidemia     Has been unable to take statins due to muscle pain. Has tried multiple statins per her report.  . Abnormal echocardiogram 09/2009    EF >55%, mild LVH, moderate LAE, normal RV, normal PA systolic pressure  . Paroxysmal atrial fibrillation (HCC)     Breakthrough a fib on sotalol, changed to amiodarone 4/11. DCCV 4/11  . Chronic diastolic CHF (congestive heart failure) ([place])   . Pacemaker-Medtronic CRT P. 11/23/2010    Atrial port was plugged   . Complete heart block-S./P. AV junction ablation 11/23/2010    Past Surgical History  Procedure Laterality Date  . Cardiac catheterization      Left heart cath x3 in teh past, last 5 yrs ago. All "normal" per pt report  . Pacemaker insertion  06/12/2010  . Atrial ablation surgery  06/12/2010  . Appendectomy    . Cholecystectomy open    . Vesicovaginal fistula closure w/ tah    . Carpal tunnel release    . Cardioversion      Prior to Admission medications   Medication Sig Start Date End Date Taking? Authorizing Provider  ELIQUIS 5 MG TABS tablet TAKE ONE TABLET BY MOUTH TWICE DAILY 08/25/15   Minna Merritts, MD  famotidine (PEPCID) 10 MG tablet Take 10 mg by mouth daily as needed. Reported on 08/19/2015    Historical Provider, MD  furosemide (LASIX) 20 MG tablet TAKE ONE TABLET BY MOUTH TWICE DAILY  Patient taking differently: TAKE ONE TABLET BY MOUTH DAILY 09/16/14   Minna Merritts, MD  furosemide (LASIX) 40 MG tablet Take 1 tablet (40 mg total)  by mouth 2 (two) times daily. Patient taking differently: Take 40 mg by mouth as needed.  05/20/14   Minna Merritts, MD  GuaiFENesin (MUCINEX PO) Take by mouth as needed.    Historical Provider, MD  levothyroxine (SYNTHROID, LEVOTHROID) 100 MCG tablet Take 100 mcg by mouth daily before breakfast.    Historical Provider, MD  meclizine (ANTIVERT) 25 MG tablet Take 1 tablet (25 mg total) by mouth 3 (three) times daily as needed. 06/12/14   Minna Merritts, MD  metoprolol (LOPRESSOR) 50 MG tablet Take 1.5 tablets (75 mg total) by mouth 2 (two) times daily. 06/16/15   Minna Merritts, MD  nitroGLYCERIN (NITROSTAT) 0.4 MG SL tablet Place 1 tablet (0.4 mg total) under the tongue every 5 (five) minutes as needed for chest pain. 05/22/13   Minna Merritts, MD  potassium chloride (K-DUR,KLOR-CON) 10 MEQ tablet Take 1 tablet (10 mEq total) by mouth daily. 06/16/15   Minna Merritts, MD  prochlorperazine (COMPAZINE) 5 MG tablet Take 1 tablet (5 mg total) by mouth every 6 (six) hours as needed. 05/22/13   Minna Merritts, MD  VITAMIN B1-B12 IM Inject into the muscle every 30 (thirty) days.    Historical Provider, MD    Allergies as of 08/26/2015 - Review Complete 08/19/2015  Allergen  Reaction Noted  . Ivp dye [iodinated diagnostic agents] Shortness Of Breath 02/01/2013  . Codeine    . Iodine    . Morphine    . Oxycodone-acetaminophen    . Persantine [dipyridamole]    . Statins    . Sulfonamide derivatives    . Amlodipine Rash 08/07/2014    Family History  Problem Relation Age of Onset  . Heart attack Mother     MI x2  . Heart attack Father     MI  . Colon cancer Daughter   . Stroke Neg Hx     Social History   Social History  . Marital Status: Widowed    Spouse Name: N/A  . Number of Children: N/A  . Years of Education: N/A   Occupational History  . Not on file.   Social History Main Topics  . Smoking status: Never Smoker   . Smokeless tobacco: Never Used     Comment: pos smoke  exposure through father and spouse who smoked in the home with her for many years  . Alcohol Use: No  . Drug Use: No  . Sexual Activity: Not on file   Other Topics Concern  . Not on file   Social History Narrative   From [place]   Widowed   Lives alone     Physical Exam: Pulse 70  Temp(Src) 96.9 F (36.1 C) (Tympanic)  Resp 20 General:   Alert,  pleasant and cooperative in NAD Head:  Normocephalic and atraumatic. Neck:  Supple; no masses or thyromegaly. Lungs:  Clear throughout to auscultation.    Heart:  Regular rate and rhythm. Abdomen:  Soft, nontender and nondistended. Normal bowel sounds, without guarding, and without rebound.   Neurologic:  Alert and  oriented x4;  grossly normal neurologically.  Impression/Plan: Jacqueline Lozano is here for an endoscopy to be performed for dysphagia, colonoscopy for chronic diarrhea  Risks, benefits, limitations, and alternatives regarding  Endoscopy/colonoscopy have been reviewed with the patient.  Questions have been answered.  All parties agreeable.   Josefine Class, MD  09/08/2015, 1:28 PM

## 2015-09-08 NOTE — Op Note (Signed)
[facility] Gastroenterology Patient Name: Jacqueline Lozano Procedure Date: 09/08/2015 1:12 PM MRN: [id number] Account #: [id number] Date of Birth: 04-24-1935 Admit Type: Outpatient Age: 81 Room: [facility] ENDO ROOM 3 Gender: Female Note Status: Finalized Procedure:            Upper GI endoscopy Indications:          Dysphagia, Follow-up of Barrett's esophagus Patient Profile:      This is a 82 year old female. Providers:            Gerrit Heck. Rayann Heman, MD Referring MD:         Minna Merritts, MD (Referring MD), Irven Easterly. Kary Kos,                        MD (Referring MD) Medicines:            Propofol per Anesthesia Complications:        No immediate complications. Procedure:            Pre-Anesthesia Assessment:                       - Prior to the procedure, a History and Physical was                        performed, and patient medications, allergies and                        sensitivities were reviewed. The patient's tolerance of                        previous anesthesia was reviewed.                       After obtaining informed consent, the endoscope was                        passed under direct vision. Throughout the procedure,                        the patient's blood pressure, pulse, and oxygen                        saturations were monitored continuously. The Endoscope                        was introduced through the mouth, and advanced to the                        second part of duodenum. The upper GI endoscopy was                        accomplished without difficulty. The patient tolerated                        the procedure. Findings:      A small hiatal hernia was present.      There were esophageal mucosal changes consistent with short-segment       Barrett's esophagus present at the gastroesophageal junction. The       maximum longitudinal extent of these mucosal changes was 1 cm in length.  Biopsies were taken with a cold forceps for  histology. C1M1.      The stomach was normal.      One non-bleeding superficial duodenal ulcer with no stigmata of bleeding       was found in the second portion of the duodenum. The lesion was 6 mm in       largest dimension. Biopsies were taken with a cold forceps for histology. Impression:           - Small hiatal hernia.                       - Esophageal mucosal changes consistent with                        short-segment Barrett's esophagus. Biopsied.                       - Normal stomach.                       - One non-bleeding duodenal ulcer with no stigmata of                        bleeding. Biopsied. Recommendation:       - Perform a colonoscopy.                       - Continue present medications.                       - Await pathology results.                       - The findings and recommendations were discussed with                        the patient.                       - The findings and recommendations were discussed with                        the patient's family. Procedure Code(s):    --- Professional ---                       [phone], Esophagogastroduodenoscopy, flexible, transoral;                        with biopsy, single or multiple Diagnosis Code(s):    --- Professional ---                       K44.9, Diaphragmatic hernia without obstruction or                        gangrene                       K22.70, Barrett's esophagus without dysplasia                       K26.9, Duodenal ulcer, unspecified as acute or chronic,  without hemorrhage or perforation                       R13.10, Dysphagia, unspecified CPT copyright 2016 American Medical Association. All rights reserved. The codes documented in this report are preliminary and upon coder review may  be revised to meet current compliance requirements. Mellody Life, MD 09/08/2015 1:45:25 PM This report has been signed electronically. Number of Addenda: 0 Note Initiated On:  09/08/2015 1:12 PM      [facility]

## 2015-09-09 ENCOUNTER — Encounter: Payer: Self-pay | Admitting: Gastroenterology

## 2015-09-10 LAB — SURGICAL PATHOLOGY

## 2015-09-18 ENCOUNTER — Telehealth: Payer: Self-pay | Admitting: Cardiovascular Disease

## 2015-09-18 MED ORDER — FUROSEMIDE 20 MG PO TABS: 20.0000 mg | ORAL_TABLET | Freq: Two times a day (BID) | ORAL | Status: DC

## 2015-09-18 NOTE — Telephone Encounter (Signed)
°*  STAT* If patient is at the pharmacy, call can be transferred to refill team.   1. Which medications need to be refilled? (please list name of each medication and dose if known) Lasik 20 mg   2. Which pharmacy/location (including street and city if local pharmacy) is medication to be sent to? [facility] [address]   3. Do they need a 30 day or 90 day supply? 90 day

## 2015-09-18 NOTE — Telephone Encounter (Signed)
Refill sent for Lasix 20 mg one tablet twice a day.

## 2015-09-24 LAB — CUP PACEART REMOTE DEVICE CHECK
Brady Statistic AP VP Percent: 0 %
Brady Statistic AP VS Percent: 0 %
Brady Statistic AS VP Percent: 100 %
Brady Statistic AS VS Percent: 0 %
Date Time Interrogation Session: 20170321144116
Implantable Lead Location: 753858
Lead Channel Impedance Value: 4047 Ohm
Lead Channel Impedance Value: 4047 Ohm
Lead Channel Impedance Value: 437 Ohm
Lead Channel Impedance Value: 665 Ohm
Lead Channel Impedance Value: 779 Ohm
Lead Channel Pacing Threshold Amplitude: 1.5 V
Lead Channel Pacing Threshold Pulse Width: 1 ms
Lead Channel Sensing Intrinsic Amplitude: 5.125 mV
Lead Channel Setting Pacing Pulse Width: 0.4 ms
Lead Channel Setting Sensing Sensitivity: 0.9 mV
MDC IDC LEAD IMPLANT DT: 20120106
MDC IDC LEAD IMPLANT DT: 20120106
MDC IDC LEAD LOCATION: 753860
MDC IDC MSMT BATTERY REMAINING LONGEVITY: 29 mo
MDC IDC MSMT BATTERY VOLTAGE: 2.93 V
MDC IDC MSMT LEADCHNL LV IMPEDANCE VALUE: 1026 Ohm
MDC IDC MSMT LEADCHNL LV IMPEDANCE VALUE: 551 Ohm
MDC IDC MSMT LEADCHNL LV IMPEDANCE VALUE: 684 Ohm
MDC IDC MSMT LEADCHNL RV IMPEDANCE VALUE: 475 Ohm
MDC IDC MSMT LEADCHNL RV PACING THRESHOLD AMPLITUDE: 0.75 V
MDC IDC MSMT LEADCHNL RV PACING THRESHOLD PULSEWIDTH: 0.4 ms
MDC IDC MSMT LEADCHNL RV SENSING INTR AMPL: 5.125 mV
MDC IDC SET LEADCHNL LV PACING AMPLITUDE: 2.25 V
MDC IDC SET LEADCHNL LV PACING PULSEWIDTH: 1 ms
MDC IDC SET LEADCHNL RV PACING AMPLITUDE: 2 V
MDC IDC STAT BRADY RA PERCENT PACED: 0 %
MDC IDC STAT BRADY RV PERCENT PACED: 100 %

## 2015-09-26 ENCOUNTER — Encounter: Payer: Self-pay | Admitting: Cardiology

## 2015-11-25 ENCOUNTER — Ambulatory Visit (INDEPENDENT_AMBULATORY_CARE_PROVIDER_SITE_OTHER): Payer: Medicare Other | Admitting: *Deleted

## 2015-11-25 ENCOUNTER — Telehealth: Payer: Self-pay | Admitting: Cardiology

## 2015-11-25 DIAGNOSIS — I5032 Chronic diastolic (congestive) heart failure: Secondary | ICD-10-CM

## 2015-11-25 DIAGNOSIS — I442 Atrioventricular block, complete: Secondary | ICD-10-CM

## 2015-11-25 NOTE — Telephone Encounter (Signed)
Spoke with pt and reminded pt of remote transmission that is due today. Pt verbalized understanding.   

## 2015-11-25 NOTE — Progress Notes (Signed)
Remote pacemaker transmission.   

## 2015-11-26 LAB — CUP PACEART REMOTE DEVICE CHECK
Battery Voltage: 2.93 V
Brady Statistic AP VP Percent: 0 %
Brady Statistic AS VP Percent: 98.41 %
Brady Statistic RA Percent Paced: 0 %
Implantable Lead Implant Date: 20120106
Implantable Lead Implant Date: 20120106
Implantable Lead Model: 5076
Lead Channel Impedance Value: 1140 Ohm
Lead Channel Impedance Value: 4047 Ohm
Lead Channel Impedance Value: 4047 Ohm
Lead Channel Impedance Value: 513 Ohm
Lead Channel Impedance Value: 703 Ohm
Lead Channel Impedance Value: 855 Ohm
Lead Channel Pacing Threshold Pulse Width: 0.4 ms
Lead Channel Sensing Intrinsic Amplitude: 5.125 mV
Lead Channel Setting Pacing Amplitude: 2 V
Lead Channel Setting Pacing Pulse Width: 1 ms
Lead Channel Setting Sensing Sensitivity: 0.9 mV
MDC IDC LEAD LOCATION: 753858
MDC IDC LEAD LOCATION: 753860
MDC IDC MSMT BATTERY REMAINING LONGEVITY: 22 mo
MDC IDC MSMT LEADCHNL LV IMPEDANCE VALUE: 608 Ohm
MDC IDC MSMT LEADCHNL LV IMPEDANCE VALUE: 760 Ohm
MDC IDC MSMT LEADCHNL LV PACING THRESHOLD AMPLITUDE: 1.5 V
MDC IDC MSMT LEADCHNL LV PACING THRESHOLD PULSEWIDTH: 1 ms
MDC IDC MSMT LEADCHNL RV IMPEDANCE VALUE: 456 Ohm
MDC IDC MSMT LEADCHNL RV PACING THRESHOLD AMPLITUDE: 0.75 V
MDC IDC MSMT LEADCHNL RV SENSING INTR AMPL: 5.125 mV
MDC IDC SESS DTM: 20170620161837
MDC IDC SET LEADCHNL RV PACING AMPLITUDE: 2 V
MDC IDC SET LEADCHNL RV PACING PULSEWIDTH: 0.4 ms
MDC IDC STAT BRADY AP VS PERCENT: 0 %
MDC IDC STAT BRADY AS VS PERCENT: 1.59 %
MDC IDC STAT BRADY RV PERCENT PACED: 98.41 %

## 2015-11-28 ENCOUNTER — Encounter: Payer: Self-pay | Admitting: Cardiology

## 2015-12-26 ENCOUNTER — Other Ambulatory Visit: Payer: Self-pay | Admitting: Cardiovascular Disease

## 2016-02-16 ENCOUNTER — Encounter: Payer: Self-pay | Admitting: Cardiovascular Disease

## 2016-02-16 ENCOUNTER — Ambulatory Visit (INDEPENDENT_AMBULATORY_CARE_PROVIDER_SITE_OTHER): Payer: Medicare Other | Admitting: Cardiovascular Disease

## 2016-02-16 VITALS — BP 140/90 | HR 74 | Ht 62.0 in | Wt 210.0 lb

## 2016-02-16 DIAGNOSIS — I5032 Chronic diastolic (congestive) heart failure: Secondary | ICD-10-CM | POA: Diagnosis not present

## 2016-02-16 DIAGNOSIS — I4891 Unspecified atrial fibrillation: Secondary | ICD-10-CM

## 2016-02-16 DIAGNOSIS — Z950 Presence of cardiac pacemaker: Secondary | ICD-10-CM | POA: Diagnosis not present

## 2016-02-16 DIAGNOSIS — R0602 Shortness of breath: Secondary | ICD-10-CM

## 2016-02-16 DIAGNOSIS — I442 Atrioventricular block, complete: Secondary | ICD-10-CM

## 2016-02-16 MED ORDER — FUROSEMIDE 20 MG PO TABS: 20.0000 mg | ORAL_TABLET | Freq: Two times a day (BID) | ORAL | 3 refills | Status: DC | PRN

## 2016-02-16 MED ORDER — MECLIZINE HCL 25 MG PO TABS: 25.0000 mg | ORAL_TABLET | Freq: Three times a day (TID) | ORAL | 1 refills | Status: DC | PRN

## 2016-02-16 NOTE — Patient Instructions (Addendum)
Medication Instructions:   No medication changes made  Try extra lasix 40 a day for a few days  Labwork:  No new labs needed  Testing/Procedures:  No further testing at this time  I will talk with Dr. Caryl Comes about pacer setting for SOB  Follow-Up: It was a pleasure seeing you in the office today. Please call us if you have new issues that need to be addressed before your next appt.  [phone]  Your physician wants you to follow-up in: 6 months.  You will receive a reminder letter in the mail two months in advance. If you don't receive a letter, please call our office to schedule the follow-up appointment.  If you need a refill on your cardiac medications before your next appointment, please call your pharmacy.

## 2016-02-16 NOTE — Progress Notes (Signed)
Cardiology Office Note  Date:  02/16/2016   ID:  ANGENI LIDDELL, DOB April 29, 1935, MRN [id number]  PCP:  [name], MD   Chief Complaint  Patient presents with  . Other    6 month f/u c/o sob. Meds reviewed verbally with pt.    HPI:  Mrs Apicella is a pleasant 81 -year-old woman with morbid obesity,  atrial fibrillation, status post arteriovenous node ablation, Pacer/CRT-P, Hypertension, Paroxysmal nocturnal dyspnea, Hypothyroidism, Diastolic congestive heart failure, C. difficile infection, who presents for followup of her shortness of breath. Previous pacemaker interrogation shows that she is  ventricularly paced, permanent atrial fibrillation Followed by Dr. Caryl Comes  Had egd, colo Went ok, Main complaint is SOB with exertion. Reports she cannot keep going this way Very sedentary, no exercise. Blames her knees Cant do exercise secondary to knee arthritis Weight is stable, denies any abdominal bloating, no leg edema Wonders if she needs to pay for oxygen out-of-pocket as symptoms are severe Wonders what can be done. Difficulty walking to her mailbox, has to stop secondary to shortness of breath  Previous Echo 07/2014: Reviewed with her EF 60%, normal RVSP, mild to moderate AS  Typically takes Lasix 20 mg daily, sometimes 20 mg twice a day  EKG on today's visit shows paced rhythm rate 74 bpm  Eliquis samples unavailable for her today, she is in the donut hole  Other past medical history unable to participate in pulmonary rehabilitation secondary to chronic knee pain, osteoarthritis. Weight and conditioning is a major issue.  echocardiogram shows normal LV function, stable valve disease, normal right ventricular systolic pressures.  Previous visit to the emergency room for atypical chest pain. Cardiac enzymes were negative and she was discharged home Prior workup with echocardiogram in 2014 and stress test were unrevealing.   sleep study in October and again in  November. Sleep study in October suggested she repeat the test as it was incomplete. The note indicates the study was discontinued as the patient had heart block, with limited EKG monitoring appearing to demonstrate a 43 second period of complete heart block with ventricular standstill. Atrial rate was 80 beats per minute. Note indicates the patient was awake and alert during the event with some dizziness. Pacer was checked after this event,  Normal functioning device.  Followup sleep study 04/24/2013 recommended weight loss, though there was no indications for CPAP at this time. Previous stress test  early 2014 showed no ischemia  PMH:   has a past medical history of Abnormal echocardiogram (09/2009); Allergic to IV contrast; Chronic diastolic CHF (congestive heart failure) ([place]); Complete heart block-S./P. AV junction ablation (11/23/2010); GERD (gastroesophageal reflux disease); Hyperlipidemia; Hypertension; Hypothyroidism; Migraine; OSA (obstructive sleep apnea); Pacemaker-Medtronic CRT P. (11/23/2010); PAD (peripheral artery disease) ([place]); and Paroxysmal atrial fibrillation ([place]).  PSH:    Past Surgical History:  Procedure Laterality Date  . APPENDECTOMY    . ATRIAL ABLATION SURGERY  06/12/2010  . CARDIAC CATHETERIZATION     Left heart cath x3 in teh past, last 5 yrs ago. All "normal" per pt report  . CARDIOVERSION    . CARPAL TUNNEL RELEASE    . CHOLECYSTECTOMY OPEN    . COLONOSCOPY WITH PROPOFOL N/A 09/08/2015   Procedure: COLONOSCOPY WITH PROPOFOL;  Surgeon: Josefine Class, MD;  Location: [facility] ENDOSCOPY;  Service: Endoscopy;  Laterality: N/A;  . ESOPHAGOGASTRODUODENOSCOPY (EGD) WITH PROPOFOL N/A 09/08/2015   Procedure: ESOPHAGOGASTRODUODENOSCOPY (EGD) WITH PROPOFOL;  Surgeon: Josefine Class, MD;  [facility];  Service: Endoscopy;  Laterality: N/A;  . PACEMAKER INSERTION  06/12/2010  . VESICOVAGINAL FISTULA CLOSURE W/ TAH      Current Outpatient Prescriptions   Medication Sig Dispense Refill  . ELIQUIS 5 MG TABS tablet TAKE ONE TABLET BY MOUTH TWICE DAILY 60 tablet 3  . famotidine (PEPCID) 10 MG tablet Take 10 mg by mouth daily as needed. Reported on 08/19/2015    . furosemide (LASIX) 20 MG tablet Take 1 tablet (20 mg total) by mouth 2 (two) times daily as needed. 180 tablet 3  . furosemide (LASIX) 40 MG tablet Take 1 tablet (40 mg total) by mouth 2 (two) times daily. (Patient taking differently: Take 40 mg by mouth as needed. ) 180 tablet 3  . GuaiFENesin (MUCINEX PO) Take by mouth as needed.    Marland Kitchen levothyroxine (SYNTHROID, LEVOTHROID) 100 MCG tablet Take 100 mcg by mouth daily before breakfast.    . meclizine (ANTIVERT) 25 MG tablet Take 1 tablet (25 mg total) by mouth 3 (three) times daily as needed. 90 tablet 1  . metoprolol (LOPRESSOR) 50 MG tablet Take 1.5 tablets (75 mg total) by mouth 2 (two) times daily. 270 tablet 3  . nitroGLYCERIN (NITROSTAT) 0.4 MG SL tablet Place 1 tablet (0.4 mg total) under the tongue every 5 (five) minutes as needed for chest pain. 25 tablet 3  . potassium chloride (K-DUR,KLOR-CON) 10 MEQ tablet Take 1 tablet (10 mEq total) by mouth daily. 90 tablet 3  . prochlorperazine (COMPAZINE) 5 MG tablet Take 1 tablet (5 mg total) by mouth every 6 (six) hours as needed. 90 tablet 5  . VITAMIN B1-B12 IM Inject into the muscle every 30 (thirty) days.     No current facility-administered medications for this visit.      Allergies:   Ivp dye [iodinated diagnostic agents]; Codeine; Iodine; Morphine; Oxycodone-acetaminophen; Persantine [dipyridamole]; Statins; Sulfonamide derivatives; and Amlodipine   Social History:  The patient  reports that she has never smoked. She has never used smokeless tobacco. She reports that she does not drink alcohol or use drugs.   Family History:   family history includes Colon cancer in her daughter; Heart attack in her father and mother.    Review of Systems: Review of Systems  Constitutional:  Negative.   Respiratory: Positive for shortness of breath.   Cardiovascular: Negative.   Gastrointestinal: Negative.   Musculoskeletal: Negative.   Neurological: Negative.   Psychiatric/Behavioral: Negative.   All other systems reviewed and are negative.    PHYSICAL EXAM: VS:  BP 140/90 (BP Location: Left Arm, Patient Position: Sitting, Cuff Size: Normal)   Pulse 74   Ht 5\' 2"  (1.575 m)   Wt 210 lb (95.3 kg)   BMI 38.41 kg/m  , BMI Body mass index is 38.41 kg/m. GEN: Well nourished, well developed, in no acute distress, obese  HEENT: normal  Neck: no JVD, carotid bruits, or masses Cardiac: RRR; no murmurs, rubs, or gallops,no edema  Respiratory:  clear to auscultation bilaterally, normal work of breathing GI: soft, nontender, nondistended, + BS MS: no deformity or atrophy  Skin: warm and dry, no rash Neuro:  Strength and sensation are intact Psych: euthymic mood, full affect    Recent Labs: No results found for requested labs within last 8760 hours.    Lipid Panel Lab Results  Component Value Date   CHOL 294 (H) 09/30/2009   HDL 44 09/30/2009   LDLCALC See Comment mg/dL 09/30/2009   TRIG 459 (H) 09/30/2009      Wt Readings  from Last 3 Encounters:  02/16/16 210 lb (95.3 kg)  08/19/15 209 lb (94.8 kg)  05/27/15 208 lb (94.3 kg)       ASSESSMENT AND PLAN:  Atrial fibrillation, unspecified type ([place]) - Plan: EKG 12-Lead On anticoagulation, discussed various other options for anticoagulation as she is in the doughnut hole, medication costing $300 We will try to provide samples or may need to switch her to xarelto to make use of coupon and other samples. We did discuss going back on warfarin, she was on this previously  Shortness of breath Etiology unclear though likely multifactorial AV node ablation, ventricularly paced. Unclear settings can be changed, rate response? Symptoms happening nominally when she exerts herself. She is obese, deconditioned Also  with history of diastolic CHF, she will take 40 mill grams Lasix for 3 days to see if this helps her symptoms  Pacemaker-Medtronic CRT P. Managed by Dr. Caryl Comes. We'll discuss whether settings need to be changed for her shortness of breath symptoms  Chronic diastolic CHF (congestive heart failure) ([place]) Grossly does not appear fluid overloaded. She will try 3 days of Lasix 40 mg daily to see if this helps her breathing  Complete heart block-S./P. AV junction ablation   Total encounter time more than 25 minutes  Greater than 50% was spent in counseling and coordination of care with the patient   Disposition:   F/U  6 months   Orders Placed This Encounter  Procedures  . EKG 12-Lead     Signed, Esmond Plants, M.D., Ph.D. 02/16/2016  [place], [place]

## 2016-02-17 ENCOUNTER — Telehealth: Payer: Self-pay | Admitting: Internal Medicine

## 2016-02-17 NOTE — Telephone Encounter (Signed)
Pt si caling to update phone #. States a nurse was to call her regarding Dr. Caryl Comes adjusting her pacemaker. Please call.

## 2016-02-17 NOTE — Telephone Encounter (Signed)
Per Dr. Donivan Scull 9/11 notes: "Pacemaker-Medtronic CRT P. Managed by Dr. Caryl Comes. We'll discuss whether settings need to be changed for her shortness of breath symptoms"  Pt called to inquire as to whether this had been discussed w/Dr. Caryl Comes.

## 2016-02-17 NOTE — Telephone Encounter (Signed)
Jacqueline Lozano is out of the office this week We will try to make appointment ASAP for pacer interrogation for shortness of breath

## 2016-02-18 ENCOUNTER — Telehealth: Payer: Self-pay | Admitting: Cardiovascular Disease

## 2016-02-18 NOTE — Telephone Encounter (Signed)
She has appt for pacer check on 02/24/16.

## 2016-02-18 NOTE — Telephone Encounter (Signed)
Spoke w/ pt.  Advised her to send her remote transmission on 02/24/16 so that Dr.Klein can evaluate.  Advised her that I am leaving samples of Eliquis 5 mg at the front desk for her to p/u at her convenience.  She is appreciative and will call back w/ any further questions or concerns.

## 2016-02-18 NOTE — Telephone Encounter (Signed)
Pt is calling back to find out about her pacer . She is to have a remote check on Tues 9/19. See previous phone note.

## 2016-02-18 NOTE — Telephone Encounter (Signed)
Please see previous phone note.  

## 2016-02-24 ENCOUNTER — Ambulatory Visit (INDEPENDENT_AMBULATORY_CARE_PROVIDER_SITE_OTHER): Payer: Medicare Other | Admitting: *Deleted

## 2016-02-24 DIAGNOSIS — I4891 Unspecified atrial fibrillation: Secondary | ICD-10-CM | POA: Diagnosis not present

## 2016-02-24 DIAGNOSIS — I5032 Chronic diastolic (congestive) heart failure: Secondary | ICD-10-CM

## 2016-02-24 DIAGNOSIS — Z950 Presence of cardiac pacemaker: Secondary | ICD-10-CM

## 2016-02-24 NOTE — Progress Notes (Signed)
Remote pacemaker transmission.   

## 2016-02-25 ENCOUNTER — Encounter: Payer: Self-pay | Admitting: Cardiology

## 2016-03-03 ENCOUNTER — Telehealth: Payer: Self-pay | Admitting: Cardiovascular Disease

## 2016-03-03 NOTE — Telephone Encounter (Signed)
Pt called, stateing she has had increased SOB and she can tell the left side of her chest "you can see going up and down". States more frequent thumping. She describes it as a muscle spasm. States if she does too much, she feels as if she has a "bag of sand" lying on her chest. Please call.

## 2016-03-03 NOTE — Telephone Encounter (Signed)
Patient called in stating that she has been having increasing shortness of breath and not feeling well. She reports that she even had some chest discomfort at times but it feels like spasms. She states that it is not necessary for her to go to ED at this time. She wants to come back in to see Dr. Rockey Situ to discuss her decline in health. Scheduled her to see Dr. Rockey Situ on 03/23/16 at 3:00PM. Instructed her to go to nearest emergency room if her symptoms get worse. She verbalized understanding of our conversation and had no further questions at this time.

## 2016-03-15 LAB — CUP PACEART REMOTE DEVICE CHECK
Battery Remaining Longevity: 20 mo
Battery Voltage: 2.91 V
Brady Statistic AS VS Percent: 0 %
Date Time Interrogation Session: 20170919130157
Implantable Lead Implant Date: 20120106
Implantable Lead Model: 5076
Lead Channel Pacing Threshold Amplitude: 0.875 V
Lead Channel Pacing Threshold Amplitude: 1.875 V
Lead Channel Pacing Threshold Pulse Width: 0.4 ms
Lead Channel Pacing Threshold Pulse Width: 1 ms
Lead Channel Sensing Intrinsic Amplitude: 5.125 mV
Lead Channel Sensing Intrinsic Amplitude: 5.125 mV
Lead Channel Setting Pacing Amplitude: 2 V
Lead Channel Setting Pacing Pulse Width: 0.4 ms
Lead Channel Setting Pacing Pulse Width: 1 ms
Lead Channel Setting Sensing Sensitivity: 0.9 mV
MDC IDC LEAD IMPLANT DT: 20120106
MDC IDC LEAD LOCATION: 753858
MDC IDC LEAD LOCATION: 753860
MDC IDC MSMT LEADCHNL LV IMPEDANCE VALUE: 1178 Ohm
MDC IDC MSMT LEADCHNL LV IMPEDANCE VALUE: 608 Ohm
MDC IDC MSMT LEADCHNL LV IMPEDANCE VALUE: 722 Ohm
MDC IDC MSMT LEADCHNL LV IMPEDANCE VALUE: 798 Ohm
MDC IDC MSMT LEADCHNL LV IMPEDANCE VALUE: 893 Ohm
MDC IDC MSMT LEADCHNL RA IMPEDANCE VALUE: 4047 Ohm
MDC IDC MSMT LEADCHNL RA IMPEDANCE VALUE: 4047 Ohm
MDC IDC MSMT LEADCHNL RV IMPEDANCE VALUE: 437 Ohm
MDC IDC MSMT LEADCHNL RV IMPEDANCE VALUE: 513 Ohm
MDC IDC SET LEADCHNL LV PACING AMPLITUDE: 2.5 V
MDC IDC STAT BRADY AP VP PERCENT: 0 %
MDC IDC STAT BRADY AP VS PERCENT: 0 %
MDC IDC STAT BRADY AS VP PERCENT: 100 %
MDC IDC STAT BRADY RA PERCENT PACED: 0 %
MDC IDC STAT BRADY RV PERCENT PACED: 100 %

## 2016-03-23 ENCOUNTER — Ambulatory Visit: Payer: Medicare Other | Admitting: Cardiovascular Disease

## 2016-03-24 ENCOUNTER — Encounter: Payer: Self-pay | Admitting: Cardiovascular Disease

## 2016-03-24 ENCOUNTER — Ambulatory Visit (INDEPENDENT_AMBULATORY_CARE_PROVIDER_SITE_OTHER): Payer: Medicare Other | Admitting: Cardiovascular Disease

## 2016-03-24 VITALS — BP 138/84 | HR 74 | Ht 62.0 in | Wt 213.0 lb

## 2016-03-24 DIAGNOSIS — I5032 Chronic diastolic (congestive) heart failure: Secondary | ICD-10-CM

## 2016-03-24 DIAGNOSIS — I442 Atrioventricular block, complete: Secondary | ICD-10-CM | POA: Diagnosis not present

## 2016-03-24 DIAGNOSIS — I4891 Unspecified atrial fibrillation: Secondary | ICD-10-CM | POA: Diagnosis not present

## 2016-03-24 NOTE — Progress Notes (Signed)
Cardiology Office Note  Date:  03/24/2016   ID:  Jacqueline Lozano, DOB 02-11-1935, MRN [id number]  PCP:  [name], MD   Chief Complaint  Patient presents with  . other    C/o chest heaviness/ palpitations and sob. Meds reviewed verbally with pt.    HPI:  Jacqueline Lozano is a pleasant 81 -year-old woman with morbid obesity,  atrial fibrillation, status post arteriovenous node ablation, Pacer/CRT-P, Hypertension, Paroxysmal nocturnal dyspnea, Hypothyroidism, Diastolic congestive heart failure, C. difficile infection, who presents for followup of her shortness of breath. Previous pacemaker interrogation shows that she is  ventricularly paced Followed by Dr. Caryl Comes Echo 07/2014: mild to mo AS  In follow-up today, she reports that something needs to be done about her breathing.  Recent pacemaker download reviewed with her, optivol is not particularly elevated, symptoms were not relieved by taking extra Lasix.  Still with SOB with minimal exertion. Reports having trouble walking into the grocery store even when she parks in the disabled spot, needs to take wheelchair or use a scooter.  Denies any leg swelling, no PND or orthopnea. Feels fine at rest, severe symptoms on minimal exertion.  Symptoms were mild but chronic last year when she saw Dr. Caryl Comes, now reports they are severe.   Previous echocardiogram last year showed mild to moderate aortic valve stenosis, normal EF, normal right heart pressures  At baseline, she is Very sedentary, no exercise. Blames her knees Unable to do pulmonary rehabilitation Weight is stable,   We did walker in the office, rate response was appropriate, hit 98 bpm walking down the hall, no drop in oxygen saturation, maintained for the 95%. She was very short of breath, had difficulty walking back to the room, had to walk very slowly, take breaks  EKG on today's visit shows ventricularly paced rhythm rate 74 bpm  Other past medical history  reviewed  Previous Echo 07/2014: Reviewed with her EF 60%, normal RVSP, mild to moderate AS  Typically takes Lasix 20 mg daily, sometimes 20 mg twice a day  unable to participate in pulmonary rehabilitation secondary to chronic knee pain, osteoarthritis. Weight and conditioning is a major issue.  echocardiogram shows normal LV function, stable valve disease, normal right ventricular systolic pressures.  Previous visit to the emergency room for atypical chest pain. Cardiac enzymes were negative and she was discharged home Prior workup with echocardiogram in 2014 and stress test were unrevealing.   sleep study in October and again in November. Sleep study in October suggested she repeat the test as it was incomplete. The note indicates the study was discontinued as the patient had heart block, with limited EKG monitoring appearing to demonstrate a 43 second period of complete heart block with ventricular standstill. Atrial rate was 80 beats per minute. Note indicates the patient was awake and alert during the event with some dizziness. Pacer was checked after this event,  Normal functioning device.  Followup sleep study 04/24/2013 recommended weight loss, though there was no indications for CPAP at this time. Previous stress test  early 2014 showed no ischemia  PMH:   has a past medical history of Abnormal echocardiogram (09/2009); Allergic to IV contrast; Chronic diastolic CHF (congestive heart failure) ([place]); Complete heart block-S./P. AV junction ablation (11/23/2010); GERD (gastroesophageal reflux disease); Hyperlipidemia; Hypertension; Hypothyroidism; Migraine; OSA (obstructive sleep apnea); Pacemaker-Medtronic CRT P. (11/23/2010); PAD (peripheral artery disease) ([place]); and Paroxysmal atrial fibrillation ([place]).  PSH:    Past Surgical History:  Procedure Laterality Date  . APPENDECTOMY    .  ATRIAL ABLATION SURGERY  06/12/2010  . CARDIAC CATHETERIZATION     Left heart cath x3 in teh  past, last 5 yrs ago. All "normal" per pt report  . CARDIOVERSION    . CARPAL TUNNEL RELEASE    . CHOLECYSTECTOMY OPEN    . COLONOSCOPY WITH PROPOFOL N/A 09/08/2015   Procedure: COLONOSCOPY WITH PROPOFOL;  Surgeon: Josefine Class, MD;  Location: [facility] ENDOSCOPY;  Service: Endoscopy;  Laterality: N/A;  . ESOPHAGOGASTRODUODENOSCOPY (EGD) WITH PROPOFOL N/A 09/08/2015   Procedure: ESOPHAGOGASTRODUODENOSCOPY (EGD) WITH PROPOFOL;  Surgeon: Josefine Class, MD;  Location: [facility] ENDOSCOPY;  Service: Endoscopy;  Laterality: N/A;  . PACEMAKER INSERTION  06/12/2010  . VESICOVAGINAL FISTULA CLOSURE W/ TAH      Current Outpatient Prescriptions  Medication Sig Dispense Refill  . ELIQUIS 5 MG TABS tablet TAKE ONE TABLET BY MOUTH TWICE DAILY 60 tablet 3  . famotidine (PEPCID) 10 MG tablet Take 10 mg by mouth daily as needed. Reported on 08/19/2015    . furosemide (LASIX) 20 MG tablet Take 1 tablet (20 mg total) by mouth 2 (two) times daily as needed. 180 tablet 3  . furosemide (LASIX) 40 MG tablet Take 1 tablet (40 mg total) by mouth 2 (two) times daily. (Patient taking differently: Take 40 mg by mouth as needed. ) 180 tablet 3  . GuaiFENesin (MUCINEX PO) Take by mouth as needed.    Marland Kitchen levothyroxine (SYNTHROID, LEVOTHROID) 100 MCG tablet Take 100 mcg by mouth daily before breakfast.    . meclizine (ANTIVERT) 25 MG tablet Take 1 tablet (25 mg total) by mouth 3 (three) times daily as needed. 90 tablet 1  . metoprolol (LOPRESSOR) 50 MG tablet Take 1.5 tablets (75 mg total) by mouth 2 (two) times daily. 270 tablet 3  . nitroGLYCERIN (NITROSTAT) 0.4 MG SL tablet Place 1 tablet (0.4 mg total) under the tongue every 5 (five) minutes as needed for chest pain. 25 tablet 3  . potassium chloride (K-DUR,KLOR-CON) 10 MEQ tablet Take 1 tablet (10 mEq total) by mouth daily. 90 tablet 3  . prochlorperazine (COMPAZINE) 5 MG tablet Take 1 tablet (5 mg total) by mouth every 6 (six) hours as needed. 90 tablet 5  . VITAMIN  B1-B12 IM Inject into the muscle every 30 (thirty) days.     No current facility-administered medications for this visit.      Allergies:   Ivp dye [iodinated diagnostic agents]; Codeine; Iodine; Morphine; Oxycodone-acetaminophen; Persantine [dipyridamole]; Statins; Sulfonamide derivatives; and Amlodipine   Social History:  The patient  reports that she has never smoked. She has never used smokeless tobacco. She reports that she does not drink alcohol or use drugs.   Family History:   family history includes Colon cancer in her daughter; Heart attack in her father and mother.    Review of Systems: Review of Systems  Constitutional: Negative.   Respiratory: Positive for shortness of breath.   Cardiovascular: Negative.   Gastrointestinal: Negative.   Musculoskeletal: Negative.   Neurological: Negative.   Psychiatric/Behavioral: Negative.   All other systems reviewed and are negative.    PHYSICAL EXAM: VS:  BP 138/84 (BP Location: Right Arm, Patient Position: Sitting, Cuff Size: Large)   Pulse 74   Ht 5\' 2"  (1.575 m)   Wt 213 lb (96.6 kg)   SpO2 98%   BMI 38.96 kg/m  , BMI Body mass index is 38.96 kg/m. GEN: Well nourished, well developed, in no acute distress, obese  HEENT: normal  Neck: no JVD, carotid  bruits, or masses Cardiac: RRR; no murmurs, rubs, or gallops,no edema  Respiratory:  clear to auscultation bilaterally, normal work of breathing GI: soft, nontender, nondistended, + BS MS: no deformity or atrophy  Skin: warm and dry, no rash Neuro:  Strength and sensation are intact Psych: euthymic mood, full affect    Recent Labs: No results found for requested labs within last 8760 hours.    Lipid Panel Lab Results  Component Value Date   CHOL 294 (H) 09/30/2009   HDL 44 09/30/2009   LDLCALC See Comment mg/dL 09/30/2009   TRIG 459 (H) 09/30/2009      Wt Readings from Last 3 Encounters:  03/24/16 213 lb (96.6 kg)  02/16/16 210 lb (95.3 kg)  08/19/15 209  lb (94.8 kg)       ASSESSMENT AND PLAN:  Atrial fibrillation, unspecified type ([place]) - Plan: EKG 12-Lead On anticoagulation, she is in the doughnut hole, medication costing $300 We will try to provide samples until January. She does not want to go on warfarin  Shortness of breath Etiology unclear though likely multifactorial Very short of breath with minimal exertion walking down the hallway Unable to exclude pacer settings. Rate response seems to be intact with rate up to 98 bpm with exertion in the hallway optivol is low, no improvement in symptoms with Lasix She does have aortic valve stenosis but should not be causing symptoms to this degree. Low likelihood of underlying coronary disease and ischemia given previous stress test showing no ischemia, no underlying carotid disease.  She has had chronic symptoms but she reports severe recently  Unable to participate in cardiac rehabilitation given severe bilateral knee pain Before we proceed with any further testing, recommended she meet with Dr. Caryl Comes to discuss any possible pacer setting changes (A-V interval?) . She is predominantly ventricularly paced  Pacemaker-Medtronic CRT P. Managed by Dr. Caryl Comes. We'll discuss whether settings need to be changed for her shortness of breath symptoms  Chronic diastolic CHF (congestive heart failure) ([place]) Recommended she continue on 20 mg daily, 40 mg for any ankle swelling  Complete heart block-S./P. AV junction ablation   Total encounter time more than 25 minutes  Greater than 50% was spent in counseling and coordination of care with the patient   Disposition:   F/U  6 months   Orders Placed This Encounter  Procedures  . EKG 12-Lead     Signed, Esmond Plants, M.D., Ph.D. 03/24/2016  [place], [place]

## 2016-03-24 NOTE — Patient Instructions (Addendum)
Medication Instructions:   No medication changes made  Labwork:  No new labs needed  Testing/Procedures:  No further testing at this time  We will set up an appt with Dr. Caryl Comes for SOB, pacer evaluation   Follow-Up: It was a pleasure seeing you in the office today. Please call us if you have new issues that need to be addressed before your next appt.  [phone]  Your physician wants you to follow-up in: 6 months.  You will receive a reminder letter in the mail two months in advance. If you don't receive a letter, please call our office to schedule the follow-up appointment.  If you need a refill on your cardiac medications before your next appointment, please call your pharmacy.   '

## 2016-04-06 ENCOUNTER — Ambulatory Visit (INDEPENDENT_AMBULATORY_CARE_PROVIDER_SITE_OTHER): Payer: Medicare Other | Admitting: Internal Medicine

## 2016-04-06 ENCOUNTER — Telehealth: Payer: Self-pay | Admitting: Internal Medicine

## 2016-04-06 ENCOUNTER — Telehealth: Payer: Self-pay | Admitting: Cardiovascular Disease

## 2016-04-06 ENCOUNTER — Encounter: Payer: Self-pay | Admitting: Internal Medicine

## 2016-04-06 VITALS — BP 148/70 | HR 78 | Ht 63.0 in | Wt 212.0 lb

## 2016-04-06 DIAGNOSIS — Z950 Presence of cardiac pacemaker: Secondary | ICD-10-CM | POA: Diagnosis not present

## 2016-04-06 DIAGNOSIS — I5030 Unspecified diastolic (congestive) heart failure: Secondary | ICD-10-CM

## 2016-04-06 DIAGNOSIS — I442 Atrioventricular block, complete: Secondary | ICD-10-CM | POA: Diagnosis not present

## 2016-04-06 DIAGNOSIS — I482 Chronic atrial fibrillation: Secondary | ICD-10-CM | POA: Diagnosis not present

## 2016-04-06 DIAGNOSIS — I4821 Permanent atrial fibrillation: Secondary | ICD-10-CM

## 2016-04-06 LAB — CUP PACEART INCLINIC DEVICE CHECK
Battery Remaining Longevity: 18 mo
Brady Statistic AP VS Percent: 0 %
Brady Statistic AS VS Percent: 0.69 %
Date Time Interrogation Session: 20171031122822
Implantable Lead Implant Date: 20120106
Implantable Lead Location: 753858
Lead Channel Impedance Value: 1026 Ohm
Lead Channel Impedance Value: 4047 Ohm
Lead Channel Impedance Value: 4047 Ohm
Lead Channel Impedance Value: 437 Ohm
Lead Channel Impedance Value: 646 Ohm
Lead Channel Pacing Threshold Amplitude: 0.75 V
Lead Channel Pacing Threshold Amplitude: 3.5 V
Lead Channel Setting Pacing Amplitude: 2 V
Lead Channel Setting Sensing Sensitivity: 0.9 mV
MDC IDC LEAD IMPLANT DT: 20120106
MDC IDC LEAD LOCATION: 753860
MDC IDC MSMT BATTERY VOLTAGE: 2.9 V
MDC IDC MSMT LEADCHNL LV IMPEDANCE VALUE: 589 Ohm
MDC IDC MSMT LEADCHNL LV IMPEDANCE VALUE: 703 Ohm
MDC IDC MSMT LEADCHNL LV IMPEDANCE VALUE: 779 Ohm
MDC IDC MSMT LEADCHNL LV PACING THRESHOLD PULSEWIDTH: 1 ms
MDC IDC MSMT LEADCHNL RV IMPEDANCE VALUE: 513 Ohm
MDC IDC MSMT LEADCHNL RV PACING THRESHOLD PULSEWIDTH: 0.4 ms
MDC IDC PG IMPLANT DT: 20120106
MDC IDC SET LEADCHNL LV PACING AMPLITUDE: 4.25 V
MDC IDC SET LEADCHNL LV PACING PULSEWIDTH: 1 ms
MDC IDC SET LEADCHNL RV PACING PULSEWIDTH: 0.4 ms
MDC IDC STAT BRADY AP VP PERCENT: 0 %
MDC IDC STAT BRADY AS VP PERCENT: 99.31 %
MDC IDC STAT BRADY RA PERCENT PACED: 0 %
MDC IDC STAT BRADY RV PERCENT PACED: 99.66 %

## 2016-04-06 NOTE — Telephone Encounter (Signed)
Patient in office needs samples of medication:   1.  What medication and dosage are you requesting samples for? Eliquis 5 mg po bid   2.  Are you currently out of this medication?  Only one pill left

## 2016-04-06 NOTE — Progress Notes (Signed)
Dictation #1 [id number]  [id number]      Patient Care Team: [name], MD as PCP - General (Family Medicine) Minna Merritts, MD as Consulting Physician (Cardiology)   HPI  Jacqueline Lozano is a 81 y.o. female seen in followup for pacemaker implantation for tachybradycardia syndrome. She has a history of permanent atrial fibrillation and status post CRT P. she is status post AV junction ablation She has chronic shortness of breath.  This has been looked at considerably. Dr. Dollene Cleveland notes from 10/18 were reviewed. Old chart review demonstrates her last evaluation with pulmonary was 11/12 and she was in pulmonary rehabilitation 2014   She has no edmea  She has complaints of "pounding of her heart"  She also had significant hypertension and amlodipine was added. Blood pressure is reasonably controlled.       Echo 314 demonstrated ejection fraction of 55-60% with mild LVH.  Past Medical History:  Diagnosis Date  . Abnormal echocardiogram 09/2009   EF >55%, mild LVH, moderate LAE, normal RV, normal PA systolic pressure  . Allergic to IV contrast   . Chronic diastolic CHF (congestive heart failure) ([place])   . Complete heart block-S./P. AV junction ablation 11/23/2010  . GERD (gastroesophageal reflux disease)   . Hyperlipidemia    Has been unable to take statins due to muscle pain. Has tried multiple statins per her report.  . Hypertension   . Hypothyroidism   . Migraine   . OSA (obstructive sleep apnea)    Not consistently using CPAP  . Pacemaker-Medtronic CRT P. 11/23/2010   Atrial port was plugged   . PAD (peripheral artery disease) (HCC)    Occlusive disease involving left PT and AT  . Paroxysmal atrial fibrillation (H[facility])    Breakthrough a fib on sotalol, changed to amiodarone 4/11. DCCV 4/11    Past Surgical History:  Procedure Laterality Date  . APPENDECTOMY    . ATRIAL ABLATION SURGERY  06/12/2010  . CARDIAC CATHETERIZATION     Left heart cath x3 in teh  past, last 5 yrs ago. All "normal" per pt report  . CARDIOVERSION    . CARPAL TUNNEL RELEASE    . CHOLECYSTECTOMY OPEN    . COLONOSCOPY WITH PROPOFOL N/A 09/08/2015   Procedure: COLONOSCOPY WITH PROPOFOL;  Surgeon: Josefine Class, MD;  Location: [facility] ENDOSCOPY;  Service: Endoscopy;  Laterality: N/A;  . ESOPHAGOGASTRODUODENOSCOPY (EGD) WITH PROPOFOL N/A 09/08/2015   Procedure: ESOPHAGOGASTRODUODENOSCOPY (EGD) WITH PROPOFOL;  Surgeon: Josefine Class, MD;  Location: [facility] ENDOSCOPY;  Service: Endoscopy;  Laterality: N/A;  . PACEMAKER INSERTION  06/12/2010  . VESICOVAGINAL FISTULA CLOSURE W/ TAH      Current Outpatient Prescriptions  Medication Sig Dispense Refill  . ELIQUIS 5 MG TABS tablet TAKE ONE TABLET BY MOUTH TWICE DAILY 60 tablet 3  . famotidine (PEPCID) 10 MG tablet Take 10 mg by mouth daily as needed. Reported on 08/19/2015    . furosemide (LASIX) 20 MG tablet Take 1 tablet (20 mg total) by mouth 2 (two) times daily as needed. 180 tablet 3  . furosemide (LASIX) 40 MG tablet Take 1 tablet (40 mg total) by mouth 2 (two) times daily. (Patient taking differently: Take 40 mg by mouth as needed. ) 180 tablet 3  . GuaiFENesin (MUCINEX PO) Take by mouth as needed.    Marland Kitchen levothyroxine (SYNTHROID, LEVOTHROID) 100 MCG tablet Take 100 mcg by mouth daily before breakfast.    . meclizine (ANTIVERT) 25 MG tablet Take 1 tablet (25 mg total)  by mouth 3 (three) times daily as needed. 90 tablet 1  . metoprolol (LOPRESSOR) 50 MG tablet Take 1.5 tablets (75 mg total) by mouth 2 (two) times daily. 270 tablet 3  . nitroGLYCERIN (NITROSTAT) 0.4 MG SL tablet Place 1 tablet (0.4 mg total) under the tongue every 5 (five) minutes as needed for chest pain. 25 tablet 3  . potassium chloride (K-DUR,KLOR-CON) 10 MEQ tablet Take 1 tablet (10 mEq total) by mouth daily. 90 tablet 3  . prochlorperazine (COMPAZINE) 5 MG tablet Take 1 tablet (5 mg total) by mouth every 6 (six) hours as needed. 90 tablet 5  . VITAMIN  B1-B12 IM Inject into the muscle every 30 (thirty) days.     No current facility-administered medications for this visit.     Allergies  Allergen Reactions  . Ivp Dye [Iodinated Diagnostic Agents] Shortness Of Breath  . Codeine   . Iodine   . Morphine   . Oxycodone-Acetaminophen   . Persantine [Dipyridamole]   . Statins   . Sulfonamide Derivatives   . Amlodipine Rash    Review of Systems negative except from HPI and PMH  Physical Exam BP (!) 148/70 (BP Location: Right Arm, Patient Position: Sitting, Cuff Size: Normal)   Pulse 78   Ht 5\' 3"  (1.6 m)   Wt 212 lb (96.2 kg)   BMI 37.55 kg/m  Well developed and Morbidly obese  in no acute distress HENT normal E scleral and icterus clear Neck Supple JVP flat; carotids brisk and full Clear to ausculation Device pocket well healed; without hematoma or erythema.  There is no tethering *Regular rate and rhythm, no murmurs gallops or rub Soft with active bowel sounds No clubbing cyanosis no Edema Alert and oriented, grossly normal motor and sensory function Skin Warm and Dry erythematous facial rash   ECG was ordered today demonstrated atrial fibrillation and biventricular pacing with a QRS morphology that is negative in lead V1 By changing the RV offset from +50>>0 the QRS morphology changed pos V1 and neg Lead 1 Assessment and  Plan  HFpEF  Atrial fibrillation-permanent  AV junction ablation-complete heart block  Pacemaker-CRT  The patient's device was interrogated.  The information was reviewed. Changes were made as noted below  Hypertension  Her blood pressure is relatively well controlled.  Her dyspnea is chronic and stable.   She is euvolemic. We will continue her on her current dose of diuretics.  Her device was reprogrammed below LV pacing threshold; this was adjusted.  She was not able to discern reproducibly between RV and LV pacing and so if she were to have recurrent complaints of pounding I would 9 with  her blinded) alternate between RV and BiV pacing before I would inactivate the LV lead.  Have encouraged her to consider water arobics  More than 50% of 45 min was spent in counseling related to the above

## 2016-04-06 NOTE — Telephone Encounter (Signed)
Samples available of eliquis 5 mg Lot # [id number] Exp. 3/20

## 2016-04-06 NOTE — Telephone Encounter (Signed)
Patient called and was seen this morning 04/06/16 by Dr. Caryl Comes and since he adjusted her heart rate, she feels like her heart is quivering. Please call patient.

## 2016-04-06 NOTE — Patient Instructions (Signed)
Medication Instructions: - Your physician recommends that you continue on your current medications as directed. Please refer to the Current Medication list given to you today.  Labwork: - none ordered  Procedures/Testing: - none ordered  Follow-Up: - Remote monitoring is used to monitor your Pacemaker of ICD from home. This monitoring reduces the number of office visits required to check your device to one time per year. It allows [place] to keep an eye on the functioning of your device to ensure it is working properly. You are scheduled for a device check from home on 07/06/16. You may send your transmission at any time that day. If you have a wireless device, the transmission will be sent automatically. After your physician reviews your transmission, you will receive a postcard with your next transmission date.  - Your physician wants you to follow-up in: 1 year with Dr. Caryl Comes. You will receive a reminder letter in the mail two months in advance. If you don't receive a letter, please call our office to schedule the follow-up appointment.   Any Additional Special Instructions Will Be Listed Below (If Applicable). - call the [facility] at [phone] in 1 week to let them know how you are feeling with the changes that were made to your device today.    If you need a refill on your cardiac medications before your next appointment, please call your pharmacy.

## 2016-04-06 NOTE — Telephone Encounter (Signed)
Spoke with patient.  Patient describes "quivering" as being an annoyance, but not painful "like the throbbing from earlier today".  Explained purpose of LV lead and Dr. Olin Pia hope that by having the lead on, the patient's ShOB and functional will improve.  [facility] appointment for later in the week, but patient states she would like to wait another day or two before deciding if she can "deal" with the sensation.  She is encouraged by the educational information and is aware to call if her symptoms worsen.  Patient states that she is appreciative of call and denies additional questions or concerns at this time.

## 2016-04-07 ENCOUNTER — Ambulatory Visit (INDEPENDENT_AMBULATORY_CARE_PROVIDER_SITE_OTHER): Payer: Medicare Other | Admitting: *Deleted

## 2016-04-07 ENCOUNTER — Telehealth: Payer: Self-pay | Admitting: Internal Medicine

## 2016-04-07 DIAGNOSIS — I442 Atrioventricular block, complete: Secondary | ICD-10-CM

## 2016-04-07 DIAGNOSIS — Z950 Presence of cardiac pacemaker: Secondary | ICD-10-CM

## 2016-04-07 LAB — CUP PACEART INCLINIC DEVICE CHECK
Battery Voltage: 2.9 V
Brady Statistic AP VS Percent: 0 %
Brady Statistic AS VS Percent: 0 %
Implantable Lead Implant Date: 20120106
Implantable Lead Location: 753860
Implantable Pulse Generator Implant Date: 20120106
Lead Channel Impedance Value: 4047 Ohm
Lead Channel Impedance Value: 456 Ohm
Lead Channel Impedance Value: 513 Ohm
Lead Channel Impedance Value: 817 Ohm
Lead Channel Pacing Threshold Amplitude: 1.875 V
Lead Channel Pacing Threshold Pulse Width: 1 ms
MDC IDC LEAD IMPLANT DT: 20120106
MDC IDC LEAD LOCATION: 753858
MDC IDC MSMT BATTERY REMAINING LONGEVITY: 18 mo
MDC IDC MSMT LEADCHNL LV IMPEDANCE VALUE: 1121 Ohm
MDC IDC MSMT LEADCHNL LV IMPEDANCE VALUE: 589 Ohm
MDC IDC MSMT LEADCHNL LV IMPEDANCE VALUE: 684 Ohm
MDC IDC MSMT LEADCHNL LV IMPEDANCE VALUE: 874 Ohm
MDC IDC MSMT LEADCHNL RA IMPEDANCE VALUE: 4047 Ohm
MDC IDC SESS DTM: 20171101153714
MDC IDC SET LEADCHNL RV PACING AMPLITUDE: 2.5 V
MDC IDC SET LEADCHNL RV PACING PULSEWIDTH: 0.4 ms
MDC IDC SET LEADCHNL RV SENSING SENSITIVITY: 0.9 mV
MDC IDC STAT BRADY AP VP PERCENT: 0 %
MDC IDC STAT BRADY AS VP PERCENT: 0 %
MDC IDC STAT BRADY RA PERCENT PACED: 0 %
MDC IDC STAT BRADY RV PERCENT PACED: 99.62 %

## 2016-04-07 NOTE — Patient Instructions (Signed)
We will contact you about seeing a lung doctor (pulmonologist) for your shortness of breath.

## 2016-04-07 NOTE — Telephone Encounter (Signed)
Returned patient call-pt reports still experiencing stim. Scheduled on DC schedule for 11/1 at noon.

## 2016-04-07 NOTE — Telephone Encounter (Signed)
F/u Call   Jacqueline Lozano is calling to see if she can come in this afternoon . Please call

## 2016-04-07 NOTE — Telephone Encounter (Signed)
New MEssage  Pt all requesting to speak with RN Raquel Sarna about coming to the office to get her monitor adjusted. Please call back to discuss

## 2016-04-07 NOTE — Progress Notes (Signed)
CRT-P device check in clinic to reprogram LV lead off due to patient's reports of stim. Thresholds, sensing, impedance not tested. Histograms appropriate for patient and level of activity. No mode switches or ventricular high rate episodes. Per SK, reprogrammed to RV only pacing, output fixed at 2.5V. [facility]link on 07/06/16 and ROV with SK/B in 12 months.

## 2016-04-09 ENCOUNTER — Telehealth: Payer: Self-pay

## 2016-04-09 NOTE — Telephone Encounter (Signed)
Referred to ICM clinic by Levander Campion, device RN/Dr Caryl Comes.  Call to patient and explained ICM program.  She agreed to monthly ICM follow up.  Dr Rockey Situ is the primary cardiologist.  Provided ICM direct number and scheduled 1st ICM remote transmission for 05/11/2016.  Patient has pacemaker and will send manual transmission.  She stated she takes Furosemide 20 mg daily and if needed she can take 40 mg for fluid symptoms. Encouraged her to call for any fluid symptoms.

## 2016-04-19 NOTE — Addendum Note (Signed)
Addended by: Anselm Pancoast on: 04/19/2016 08:36 AM   Modules accepted: Orders

## 2016-04-27 ENCOUNTER — Telehealth: Payer: Self-pay

## 2016-04-27 NOTE — Telephone Encounter (Signed)
Patient only received 2 boxes .  Would she be able to come and get 2 more boxes.  Please call.

## 2016-04-27 NOTE — Telephone Encounter (Signed)
Eliquis 5 mg samples placed at front desk for pick up. 

## 2016-04-28 NOTE — Telephone Encounter (Signed)
I spoke with pt and she is aware that our supply is running low and next time drug rep visits and we are able to get more samples we will let her know.

## 2016-05-06 ENCOUNTER — Encounter: Payer: Self-pay | Admitting: Internal Medicine

## 2016-05-11 ENCOUNTER — Telehealth: Payer: Self-pay

## 2016-05-11 ENCOUNTER — Telehealth: Payer: Self-pay | Admitting: Cardiology

## 2016-05-11 ENCOUNTER — Ambulatory Visit (INDEPENDENT_AMBULATORY_CARE_PROVIDER_SITE_OTHER): Payer: Medicare Other

## 2016-05-11 DIAGNOSIS — Z950 Presence of cardiac pacemaker: Secondary | ICD-10-CM | POA: Diagnosis not present

## 2016-05-11 DIAGNOSIS — I5032 Chronic diastolic (congestive) heart failure: Secondary | ICD-10-CM

## 2016-05-11 NOTE — Telephone Encounter (Signed)
LMOVM reminding pt to send remote transmission.   

## 2016-05-11 NOTE — Telephone Encounter (Signed)
Eliquis 5 mg tablets at front desk for pick up.

## 2016-05-11 NOTE — Telephone Encounter (Signed)
Pt would like Eliquis samples.  

## 2016-05-13 NOTE — Telephone Encounter (Signed)
ICM call to patient.  Reminded her of monthly ICM calls and advised she did not send a remote transmission on 05/11/2016.  She does not keep monitor plugged in and explained reason why monitor should be kept plugged in.  She has a pacemaker and will send remote later today if possible.

## 2016-05-17 NOTE — Progress Notes (Signed)
EPIC Encounter for ICM Monitoring  Patient Name: Jacqueline Lozano is a 80 y.o. female Date: 05/17/2016 Primary Care Physican: [name], MD Primary Cardiologist: Rockey Situ Electrophysiologist: Caryl Comes Dry Weight: Does not weigh Bi-V Pacing:  99.5%       Heart Failure questions reviewed, pt symptomatic with ankle swelling  Thoracic impedance abnormal suggesting fluid accumulation.  Labs: 11/05/2015 Creatinine 1.3, BUN 22, Potassium 4.4, Sodium 139, EGFR 40 07/29/2015 Creatinine 1.3, BUN 24, Potassium 4.7, Sodium 138, EGFR 40   Recommendations:  Patient reported she normally take Furosemide 20 mg 1 tablet a day.  Recommended to take Furosemide 2 tablets a day x 2 days with Potassium 10 meq 2 tablets x 2 days.  After 2nd day, return to prescribed dosage of Furosemide 20 mg 1 tablet daily and Potassium 10 meq 1 tablet daily.    Follow-up plan: ICM clinic phone appointment on 05/20/2016 to recheck fluid levels  Copy of ICM check sent to primary cardiologist and device physician.    ICM trend: 05/13/2016       Rosalene Billings, RN 05/17/2016 10:50 AM

## 2016-05-20 ENCOUNTER — Ambulatory Visit (INDEPENDENT_AMBULATORY_CARE_PROVIDER_SITE_OTHER): Payer: Medicare Other

## 2016-05-20 ENCOUNTER — Telehealth: Payer: Self-pay | Admitting: Cardiology

## 2016-05-20 DIAGNOSIS — I5032 Chronic diastolic (congestive) heart failure: Secondary | ICD-10-CM

## 2016-05-20 DIAGNOSIS — Z950 Presence of cardiac pacemaker: Secondary | ICD-10-CM

## 2016-05-20 NOTE — Telephone Encounter (Signed)
Spoke with pt and reminded pt of remote transmission that is due today. Pt verbalized understanding.   

## 2016-05-21 NOTE — Progress Notes (Signed)
EPIC Encounter for ICM Monitoring  Patient Name: Jacqueline Lozano is a 81 y.o. female Date: 05/21/2016 Primary Care Physican: [name], MD Primary Cardiologist: Rockey Situ Electrophysiologist: Caryl Comes Dry Weight:    Does not weigh Bi-V Pacing:  99.5%                                                    Heart Failure questions reviewed, pt asymptomatic and leg swelling has resolved.  Thoracic impedance returned to normal after increase in Furosemide x 2 days  Labs: 11/05/2015 Creatinine 1.3, BUN 22, Potassium 4.4, Sodium 139, EGFR 40       07/29/2015 Creatinine 1.3, BUN 24, Potassium 4.7, Sodium 138, EGFR 40       Recommendations: No changes.  Reinforced to limit low salt food choices to 2000 mg day and limiting fluid intake to < 2 liters per day. Encouraged to call for fluid symptoms.    Follow-up plan: ICM clinic phone appointment on 06/17/2016.  Copy of ICM check sent to device physician.   ICM trend: 05/20/2016       Rosalene Billings, RN 05/21/2016 1:54 PM

## 2016-05-25 ENCOUNTER — Encounter: Payer: Medicare Other | Admitting: Internal Medicine

## 2016-06-02 ENCOUNTER — Other Ambulatory Visit: Payer: Self-pay | Admitting: Cardiovascular Disease

## 2016-06-17 ENCOUNTER — Telehealth: Payer: Self-pay

## 2016-06-17 ENCOUNTER — Ambulatory Visit: Payer: Medicare Other

## 2016-06-17 DIAGNOSIS — I5032 Chronic diastolic (congestive) heart failure: Secondary | ICD-10-CM

## 2016-06-17 DIAGNOSIS — Z950 Presence of cardiac pacemaker: Secondary | ICD-10-CM

## 2016-06-17 NOTE — Telephone Encounter (Signed)
Remote ICM transmission received.  Attempted patient call and left message to return call.   

## 2016-06-17 NOTE — Progress Notes (Signed)
EPIC Encounter for ICM Monitoring  Patient Name: Jacqueline Lozano is a 82 y.o. female Date: 06/17/2016 Primary Care Physican: [name], MD Primary Cardiologist:[name] Electrophysiologist: [name] Weight:Does not weigh Bi-V Pacing: 99.5%      Attempted ICM call and unable to reach.  Left message to return call. Transmission reviewed.   Thoracic impedance abnormal suggesting fluid accumulation.  Labs: 11/05/2015 Creatinine 1.3, BUN 22, Potassium 4.4, Sodium 139, EGFR 40 - Care Everywhere       07/29/2015 Creatinine 1.3, BUN 24, Potassium 4.7, Sodium 138, EGFR 40 - Care Everywhere  Recommendations:  NONE - Unable to reach today.    Follow-up plan: ICM clinic phone appointment on 06/24/2016 to recheck fluid levels.  Copy of ICM check sent to primary cardiologist and device physician.   3 month ICM trend : 06/17/2016   1 Year ICM trend:      Rosalene Billings, RN 06/17/2016 2:51 PM

## 2016-06-21 NOTE — Progress Notes (Signed)
Patient returned call and stated she is doing ok.  She feels a little Jacqueline Lozano of breath but not sure if it is any worse than normal.  Advised will recheck fluid level on 06/24/2016.   She will send transmission that morning.  No changes today.

## 2016-06-24 ENCOUNTER — Ambulatory Visit (INDEPENDENT_AMBULATORY_CARE_PROVIDER_SITE_OTHER): Payer: Medicare Other

## 2016-06-24 DIAGNOSIS — I5032 Chronic diastolic (congestive) heart failure: Secondary | ICD-10-CM

## 2016-06-24 DIAGNOSIS — Z950 Presence of cardiac pacemaker: Secondary | ICD-10-CM

## 2016-06-25 NOTE — Progress Notes (Signed)
EPIC Encounter for ICM Monitoring  Patient Name: ANNAMAE SHIVLEY is a 82 y.o. female Date: 06/25/2016 Primary Care Physican: [name], MD Primary Cardiologist:[name] Electrophysiologist: [name] Weight:Does not weigh Bi-V Pacing: 99.5%            Heart Failure questions reviewed, pt has swelling in feet and some shortness of breath when walking to mailbox  Thoracic impedance abnormal suggesting fluid accumulation.  Labs: 11/05/2015 Creatinine 1.3, BUN 22, Potassium 4.4, Sodium 139, EGFR 40 - Care Everywhere 07/29/2015 Creatinine 1.3, BUN 24, Potassium 4.7, Sodium 138, EGFR 40 - Care Everywhere  Recommendations:  Discussed with Dr Rockey Situ in office.  Call to patient and advised Dr Rockey Situ recommended she take Furosemide 20 mg 1 tablet bid and Potassium 20 meq 1 tablet bid x 3 days and then return to prior dosage.  She normally takes Furosemide 20 mg daily.  She has not been taking the 40 mg tablets.   Follow-up plan: ICM clinic phone appointment on 07/01/2016.  Copy of ICM check sent to primary cardiologist and device physician.   3 month ICM trend: 06/25/2016   1 Year ICM trend:      Rosalene Billings, RN 06/25/2016 4:15 PM

## 2016-06-28 ENCOUNTER — Telehealth: Payer: Self-pay | Admitting: Cardiovascular Disease

## 2016-06-28 NOTE — Telephone Encounter (Signed)
Pt calling stating she is doing some dental work in the morning She has a tooth is stuck in her gum and they need to extract it  She is calling for she is on eliquis  She already took it this morning but not sure if she is able to do this tomorrow  Would like advise please call back

## 2016-06-28 NOTE — Telephone Encounter (Signed)
Pt is sched for 1 tooth extraction in the am and would like to know if she can proceed on Eliquis or if she needs to reschedule appt.

## 2016-06-28 NOTE — Telephone Encounter (Signed)
Spoke w/ pt.   Advised her that Dr. Rockey Situ recommends that she hold Eliquis tonight & tomorrow am. She may restart after procedure, unless she is bleeding a lot. Asked her to call back w/ any further questions or concerns.

## 2016-06-29 ENCOUNTER — Telehealth: Payer: Self-pay

## 2016-06-29 NOTE — Telephone Encounter (Signed)
Received voice mail message from Jacona at dentist office requesting a call back, [phone].  Asking if patient can be prescribed a medication by the dentist.   Call back to [facility].  Melissa stated patient was concerned about taking Valium because of her pacemaker and wanted to know if it would interact with any of her medications.  Advised to call patients pharmacist regarding any drug interactions.

## 2016-06-29 NOTE — Telephone Encounter (Signed)
Patient called and reported she was unable to complete the recommendation from Dr Rockey Situ to increase the Furosemide and Potassium x 3 days for her fluid accumulation, last ICM remote transmission.  She asked if she should send a transmission on 1/25 as scheduled and advised to send the transmission for review.  She will not be able to increase the medication until after her dentist appointment on Thursday.

## 2016-07-01 ENCOUNTER — Ambulatory Visit (INDEPENDENT_AMBULATORY_CARE_PROVIDER_SITE_OTHER): Payer: Medicare Other

## 2016-07-01 ENCOUNTER — Telehealth: Payer: Self-pay | Admitting: Cardiovascular Disease

## 2016-07-01 DIAGNOSIS — I5032 Chronic diastolic (congestive) heart failure: Secondary | ICD-10-CM

## 2016-07-01 DIAGNOSIS — Z950 Presence of cardiac pacemaker: Secondary | ICD-10-CM

## 2016-07-01 NOTE — Telephone Encounter (Signed)
Call to patient for ICM monitoring and triage calls.  Patient complained of back pain at waist line area and catch in her breathing.  Her breathing is worse than normal and is more SOB when walking room to room.  She thinks an antibiotic would help her with both symptoms. Advised antibiotics are not prescribed over the phone if she has not been seen for office appointment to verify there is an infection.   She also called PCP office today and the earliest appointment is 2 weeks.   Reviewed Optivol report and no fluid noted for the last 2 days.    Discussed with Dr Rockey Situ in the office.  Recommendation for outpatient CT scan of the lungs, Pulmonolgist and Respiratory Therapy Services referrals.  She declined all of Dr Donivan Scull recommendations at this time and thinks her breathing will improve with an antibiotic.  Dr Rockey Situ recommended urgent care for back pain.  Advised another option for her is the ER.  She appreciated the advice and will probably go to urgent care.    Advised if she changes her mind about the CT Scan and referrals to call back and Dr Donivan Scull nurse will schedule test and make referrals.

## 2016-07-01 NOTE — Telephone Encounter (Signed)
Pt c/o Shortness Of Breath: STAT if SOB developed within the last 24 hours or pt is noticeably SOB on the phone  1. Are you currently SOB (can you hear that pt is SOB on the phone)? yes  2. How long have you been experiencing SOB? For about 2 weeks. Pt states it started out a pain in her kidney, and then moved into her lung area. Denies any CP  3. Are you SOB when sitting or when up moving around?  Severe today. All the time.   4. Are you currently experiencing any other symptoms? Pt states she had to stop taking her Eliquis due to a dental procedure performed yesterday.

## 2016-07-01 NOTE — Progress Notes (Signed)
EPIC Encounter for ICM Monitoring  Patient Name: Jacqueline Lozano is a 82 y.o. female Date: 07/01/2016 Primary Care Physican: [name], MD Primary Cardiologist:[name] Electrophysiologist: [name] Weight:Does not weigh Bi-V Pacing: 98.5%     Call to patient for ICM monitoring and triage calls.  Patient complained of back pain at waist line area and catch in her breathing.  Her breathing is worse than normal and is more SOB when walking room to room.  She thinks an antibiotic would help her with both symptoms. Advised antibiotics are not prescribed over the phone if she has not been seen for office appointment to verify there is an infection.   She also called PCP office today and the earliest appointment is 2 weeks.   Reviewed Optivol report and no fluid noted for the last 2 days.    Recommendation: Discussed with Dr Rockey Situ in the office.  Recommendation for outpatient CT scan of the lungs, Pulmonolgist and Respiratory Therapy Services referrals.  She declined all of Dr Donivan Scull recommendations at this time and thinks her breathing will improve with an antibiotic.  Dr Rockey Situ recommended urgent care for back pain.  Advised another option for her is the ER.  She appreciated the advice and will probably go to urgent care.    Advised if she changes her mind about the CT Scan and referrals to call back and Dr Donivan Scull nurse will schedule test and make referrals.    Follow-up plan: ICM clinic phone appointment on 07/20/2016.  Copy of ICM check sent to primary cardiologist and device physician.   3 month ICM trend: 07/01/2016   1 Year ICM trend:      Rosalene Billings, RN 07/01/2016 4:37 PM

## 2016-07-20 ENCOUNTER — Ambulatory Visit (INDEPENDENT_AMBULATORY_CARE_PROVIDER_SITE_OTHER): Payer: Medicare Other | Admitting: *Deleted

## 2016-07-20 DIAGNOSIS — I5032 Chronic diastolic (congestive) heart failure: Secondary | ICD-10-CM

## 2016-07-20 DIAGNOSIS — Z950 Presence of cardiac pacemaker: Secondary | ICD-10-CM

## 2016-07-20 DIAGNOSIS — I442 Atrioventricular block, complete: Secondary | ICD-10-CM

## 2016-07-20 NOTE — Progress Notes (Signed)
EPIC Encounter for ICM Monitoring  Patient Name: Jacqueline Lozano is a 82 y.o. female Date: 07/20/2016 Primary Care Physican: [name], MD Primary Cardiologist:[name] Electrophysiologist: [name] Weight:Does not weigh Bi-V Pacing: 99%      Heart Failure questions reviewed, pt asymptomatic    Thoracic impedance slightly abnormal suggesting fluid accumulation.  Recommendations: No changes. Reminded to limit dietary salt intake to 2000 mg/day and fluid intake to < 2 liters/day. Encouraged to call for fluid symptoms.  Follow-up plan: ICM clinic phone appointment on 08/20/2016.  Copy of ICM check sent to primary cardiologist and device physician.   3 month ICM trend: 07/20/2016   1 Year ICM trend:      Rosalene Billings, RN 07/20/2016 3:55 PM

## 2016-07-20 NOTE — Progress Notes (Signed)
Remote pacemaker transmission.   

## 2016-07-21 ENCOUNTER — Encounter: Payer: Self-pay | Admitting: Cardiology

## 2016-07-23 LAB — CUP PACEART REMOTE DEVICE CHECK
Battery Remaining Longevity: 25 mo
Brady Statistic AP VS Percent: 0 %
Brady Statistic AS VP Percent: 98.74 %
Brady Statistic AS VS Percent: 1.26 %
Brady Statistic RA Percent Paced: 0 %
Brady Statistic RV Percent Paced: 99.01 %
Implantable Lead Implant Date: 20120106
Implantable Lead Location: 753858
Implantable Lead Model: 5076
Lead Channel Impedance Value: 4047 Ohm
Lead Channel Impedance Value: 437 Ohm
Lead Channel Impedance Value: 494 Ohm
Lead Channel Impedance Value: 570 Ohm
Lead Channel Impedance Value: 684 Ohm
Lead Channel Impedance Value: 741 Ohm
Lead Channel Pacing Threshold Amplitude: 0.75 V
Lead Channel Pacing Threshold Amplitude: 1.875 V
Lead Channel Pacing Threshold Pulse Width: 1 ms
Lead Channel Sensing Intrinsic Amplitude: 5.125 mV
Lead Channel Sensing Intrinsic Amplitude: 5.125 mV
Lead Channel Setting Pacing Amplitude: 2.5 V
Lead Channel Setting Sensing Sensitivity: 0.9 mV
MDC IDC LEAD IMPLANT DT: 20120106
MDC IDC LEAD LOCATION: 753860
MDC IDC MSMT BATTERY VOLTAGE: 2.91 V
MDC IDC MSMT LEADCHNL LV IMPEDANCE VALUE: 1102 Ohm
MDC IDC MSMT LEADCHNL LV IMPEDANCE VALUE: 855 Ohm
MDC IDC MSMT LEADCHNL RA IMPEDANCE VALUE: 4047 Ohm
MDC IDC MSMT LEADCHNL RV PACING THRESHOLD PULSEWIDTH: 0.4 ms
MDC IDC PG IMPLANT DT: 20120106
MDC IDC SESS DTM: 20180213141846
MDC IDC SET LEADCHNL RV PACING PULSEWIDTH: 0.4 ms
MDC IDC STAT BRADY AP VP PERCENT: 0 %

## 2016-07-30 ENCOUNTER — Other Ambulatory Visit: Payer: Self-pay | Admitting: Cardiovascular Disease

## 2016-08-10 ENCOUNTER — Ambulatory Visit (INDEPENDENT_AMBULATORY_CARE_PROVIDER_SITE_OTHER): Payer: Medicare Other | Admitting: Internal Medicine

## 2016-08-10 ENCOUNTER — Encounter: Payer: Self-pay | Admitting: Internal Medicine

## 2016-08-10 VITALS — BP 136/84 | HR 80 | Wt 214.0 lb

## 2016-08-10 DIAGNOSIS — R0602 Shortness of breath: Secondary | ICD-10-CM | POA: Diagnosis not present

## 2016-08-10 DIAGNOSIS — I509 Heart failure, unspecified: Secondary | ICD-10-CM

## 2016-08-10 NOTE — Progress Notes (Signed)
Patient seen in the office today and instructed on use of Breo.  Patient expressed understanding and demonstrated technique.   Oscar La, LPN

## 2016-08-10 NOTE — Addendum Note (Signed)
Addended by: Oscar La R on: 08/10/2016 10:58 AM   Modules accepted: Orders

## 2016-08-10 NOTE — Patient Instructions (Signed)
Follow up with Cardiology Check ONO

## 2016-08-10 NOTE — Addendum Note (Signed)
Addended by: Oscar La R on: 08/10/2016 10:56 AM   Modules accepted: Orders

## 2016-08-10 NOTE — Progress Notes (Signed)
[facility] Consultation      Date: 08/10/2016,   MRN# [id number] NADYAH FILIPIAK 08/12/1934 Code Status:  Code Status History    This patient does not have a recorded code status. Please follow your organizational policy for patients in this situation.     Hosp day:@LENGTHOFSTAYDAYS @ Referring MD: @ATDPROV @     PCP:      Admission                  Current  Seferina K Elmore is a 82 y.o. old female seen in consultation for SOB at the request of Dr. Rockey Situ.     CHIEF COMPLAINT:   SOB   HISTORY OF PRESENT ILLNESS  82 yo obese white female with SOB SOB has been there for many years, SOB worsens with exertion Patient with h/o extensive CAD with Diastolic heart failure s/p Pacemaker with Aortic stenosis Her SOB with exertion has worsened in the last several months Patient does have lower ext swelling that she takes lasix for She has no cough and no wheezing She is nonsmoker  She had sleep study 1 year ago and reports that it was WNL and does NOT want another one She also has ahd a cxr in PCP' office and she reports it was WNL and does not want another one She has no signs of pneumonia at this time, no symptoms of URI   Ambulating pulse ox in office was WNL no evidence of hypoxia She has no signs o  PAST MEDICAL HISTORY   Past Medical History:  Diagnosis Date  . Abnormal echocardiogram 09/2009   EF >55%, mild LVH, moderate LAE, normal RV, normal PA systolic pressure  . Allergic to IV contrast   . Chronic diastolic CHF (congestive heart failure) ([place])   . Complete heart block-S./P. AV junction ablation 11/23/2010  . GERD (gastroesophageal reflux disease)   . Hyperlipidemia    Has been unable to take statins due to muscle pain. Has tried multiple statins per her report.  . Hypertension   . Hypothyroidism   . Migraine   . OSA (obstructive sleep apnea)    Not consistently using CPAP  . Pacemaker-Medtronic CRT P. 11/23/2010   Atrial port was  plugged   . PAD (peripheral artery disease) ([facility])    Occlusive disease involving left PT and AT  . Paroxysmal atrial fibrillation ([facility])    Breakthrough a fib on sotalol, changed to amiodarone 4/11. DCCV 4/11     SURGICAL HISTORY   Past Surgical History:  Procedure Laterality Date  . APPENDECTOMY    . ATRIAL ABLATION SURGERY  06/12/2010  . CARDIAC CATHETERIZATION     Left heart cath x3 in teh past, last 5 yrs ago. All "normal" per pt report  . CARDIOVERSION    . CARPAL TUNNEL RELEASE    . CHOLECYSTECTOMY OPEN    . COLONOSCOPY WITH PROPOFOL N/A 09/08/2015   Procedure: COLONOSCOPY WITH PROPOFOL;  Surgeon: Josefine Class, MD;  [facility] END[facility]COPY;  Service: Endoscopy;  Laterality: N/A;  . ESOPHAGOGASTRODUODENOSCOPY (EGD) WITH PROPOFOL N/A 09/08/2015   Procedure: ESOPHAGOGASTRODUODENOSCOPY (EGD) WITH PROPOFOL;  Surgeon: Josefine Class, MD;  Location[facility] ENDOSCOPY;  Service: Endoscopy;  Laterality: N/A;  . PACEMAKER INSERTION  06/12/2010  . VESICOVAGINAL FISTULA CLOSURE W/ TAH       FAMILY HISTORY   Family History  Problem Relation Age of Onset  . Heart attack Mother     MI x2  . Heart attack Father  MI  . Colon cancer Daughter   . Stroke Neg Hx      SOCIAL HISTORY   Social History  Substance Use Topics  . Smoking status: Never Smoker  . Smokeless tobacco: Never Used     Comment: pos smoke exposure through father and spouse who smoked in the home with her for many years  . Alcohol use No     MEDICATIONS    Home Medication:  Current Outpatient Rx  . Order #: [id number] Class: Normal  . Order #: [id number] Class: Historical Med  . Order #: [id number] Class: Normal  . Order #: [id number] Class: Normal  . Order #: [id number] Class: Historical Med  . Order #: [id number] Class: Normal  . Order #: [id number] Class: Historical Med  . Order #: [id number] Class: Normal  . Order #: [id number] Class: Normal  . Order #: [id number] Class: Normal  . Order #: [id number] Class:  Normal  . Order #: [id number] Class: Historical Med    Current Medication:  Current Outpatient Prescriptions:  .  ELIQUIS 5 MG TABS tablet, TAKE ONE TABLET BY MOUTH TWICE DAILY, Disp: 60 tablet, Rfl: 3 .  famotidine (PEPCID) 10 MG tablet, Take 10 mg by mouth daily as needed. Reported on 08/19/2015, Disp: , Rfl:  .  furosemide (LASIX) 20 MG tablet, Take 1 tablet (20 mg total) by mouth 2 (two) times daily as needed. (Patient taking differently: Take 20 mg by mouth 2 (two) times daily as needed. Taking 1 tablet 20 mg daily and taking 2nd tablet only when needed), Disp: 180 tablet, Rfl: 3 .  furosemide (LASIX) 40 MG tablet, Take 1 tablet (40 mg total) by mouth 2 (two) times daily. (Patient not taking: Reported on 06/25/2016), Disp: 180 tablet, Rfl: 3 .  GuaiFENesin (MUCINEX PO), Take by mouth as needed., Disp: , Rfl:  .  KLOR-CON M10 10 MEQ tablet, TAKE ONE TABLET BY MOUTH ONCE DAILY, Disp: 90 tablet, Rfl: 3 .  levothyroxine (SYNTHROID, LEVOTHROID) 100 MCG tablet, Take 100 mcg by mouth daily before breakfast., Disp: , Rfl:  .  meclizine (ANTIVERT) 25 MG tablet, Take 1 tablet (25 mg total) by mouth 3 (three) times daily as needed., Disp: 90 tablet, Rfl: 1 .  metoprolol (LOPRESSOR) 50 MG tablet, Take 1.5 tablets (75 mg total) by mouth 2 (two) times daily., Disp: 270 tablet, Rfl: 3 .  nitroGLYCERIN (NITROSTAT) 0.4 MG SL tablet, Place 1 tablet (0.4 mg total) under the tongue every 5 (five) minutes as needed for chest pain., Disp: 25 tablet, Rfl: 3 .  prochlorperazine (COMPAZINE) 5 MG tablet, Take 1 tablet (5 mg total) by mouth every 6 (six) hours as needed., Disp: 90 tablet, Rfl: 5 .  VITAMIN B1-B12 IM, Inject into the muscle every 30 (thirty) days., Disp: , Rfl:     ALLERGIES   Ivp dye [iodinated diagnostic agents]; Codeine; Iodine; Morphine; Oxycodone-acetaminophen; Persantine [dipyridamole]; Statins; Sulfonamide derivatives; and Amlodipine     REVIEW OF SYSTEMS   Review of Systems    Constitutional: Positive for malaise/fatigue. Negative for chills, diaphoresis, fever and weight loss.  HENT: Negative for congestion and hearing loss.   Eyes: Negative for blurred vision and double vision.  Respiratory: Positive for shortness of breath. Negative for cough, hemoptysis, sputum production and wheezing.   Cardiovascular: Positive for orthopnea and leg swelling. Negative for chest pain and palpitations.  Gastrointestinal: Negative for abdominal pain, heartburn, nausea and vomiting.  Genitourinary: Negative for dysuria and urgency.  Musculoskeletal: Negative for back pain, myalgias and neck pain.  Skin: Negative for rash.  Neurological: Negative  for dizziness, tingling, tremors, weakness and headaches.  Endo/Heme/Allergies: Does not bruise/bleed easily.  Psychiatric/Behavioral: Negative for depression.  All other systems reviewed and are negative.   BP 136/84 (BP Location: Right Arm, Cuff Size: Normal)   Pulse 80   Wt 214 lb (97.1 kg)   SpO2 99%   BMI 37.91 kg/m    PHYSICAL EXAM  Physical Exam  Constitutional: She is oriented to person, place, and time. She appears well-developed and well-nourished. No distress.  HENT:  Head: Normocephalic and atraumatic.  Mouth/Throat: No oropharyngeal exudate.  Eyes: EOM are normal. Pupils are equal, round, and reactive to light. No scleral icterus.  Neck: Normal range of motion. Neck supple.  Cardiovascular: Normal rate, regular rhythm and normal heart sounds.   No murmur heard. Pulmonary/Chest: No stridor. No respiratory distress. She has no wheezes. She has no rales.  Abdominal: Soft. Bowel sounds are normal.  Musculoskeletal: Normal range of motion. She exhibits edema.  Neurological: She is alert and oriented to person, place, and time. No cranial nerve deficit.  Skin: Skin is warm. She is not diaphoretic.  Psychiatric: She has a normal mood and affect.            IMAGING   I have Independently reviewed images of  CXR on 06/2104 on  08/10/2016 Interpretation:no acute process, no effusions      ASSESSMENT/PLAN   82 yo pleasant white female with SOB and DOE with multiple medical issues. Overall, her resp status is compromised by her cardiac diastolic dysfunction and aortic stenosis in the setting of morbid obesity. Her SOB will continue to decline as she ages I will start inhaler therapy but doubt that this will relieve her symptoms  1.will check ONO 2.will obtain full set of PFT's  3.will start BREO 200 and assess resp status  Patient states that she has had a sleep study and CXR already and does not want to repeat any at this time.  Patient satisfied with Plan of action and management. All questions answered  Corrin Parker, M.D.  [facility]  Medical Director [place] Director [facility] Cardio-Pulmonary Department

## 2016-08-18 ENCOUNTER — Encounter: Payer: Self-pay | Admitting: Internal Medicine

## 2016-08-18 DIAGNOSIS — I509 Heart failure, unspecified: Secondary | ICD-10-CM

## 2016-08-20 ENCOUNTER — Ambulatory Visit (INDEPENDENT_AMBULATORY_CARE_PROVIDER_SITE_OTHER): Payer: Medicare Other

## 2016-08-20 DIAGNOSIS — I5032 Chronic diastolic (congestive) heart failure: Secondary | ICD-10-CM | POA: Diagnosis not present

## 2016-08-20 DIAGNOSIS — Z950 Presence of cardiac pacemaker: Secondary | ICD-10-CM | POA: Diagnosis not present

## 2016-08-20 NOTE — Progress Notes (Signed)
EPIC Encounter for ICM Monitoring  Patient Name: Jacqueline Lozano is a 82 y.o. female Date: 08/20/2016 Primary Care Physican: [name], MD Primary Cardiologist:[name] Electrophysiologist: [name] Weight:Does not weigh Bi-V Pacing: 99%      Transmission reviewed   Thoracic impedance normal .  Recommendations: None  Follow-up plan: ICM clinic phone appointment on 09/20/2016.  Copy of ICM check sent to device physician.   3 month ICM trend: 08/20/2016   1 Year ICM trend:      Jacqueline Billings, RN 08/20/2016 4:56 PM

## 2016-08-27 ENCOUNTER — Telehealth: Payer: Self-pay | Admitting: *Deleted

## 2016-08-27 NOTE — Telephone Encounter (Signed)
Pt informed of ONO results and that she doesn't need O2 for night time use. Nothing further needed. 

## 2016-09-06 ENCOUNTER — Other Ambulatory Visit: Payer: Self-pay | Admitting: Cardiovascular Disease

## 2016-09-19 NOTE — Progress Notes (Signed)
Cardiology Office Note  Date:  09/19/2016   ID:  Jacqueline Lozano, DOB 1935/01/13, MRN [id number]  PCP:  [name], MD   No chief complaint on file.   HPI:  Jacqueline Lozano is a pleasant 82 -year-old woman with  morbid obesity,  atrial fibrillation,  status post arteriovenous node ablation, Pacer/CRT-P,  Hypertension,  Paroxysmal nocturnal dyspnea,  Hypothyroidism,  Diastolic congestive heart failure,  C. difficile infection,  Previous pacemaker interrogation shows that she is  ventricularly paced Followed by Dr. Caryl Comes Echo 07/2014: mild to mo AS who presents for followup of her shortness of breath.   In follow-up today, she reports that something needs to be done about her breathing.  She said this on her last clinic visit, still finding no relief with her breathing optivol is not particularly elevated, Infectious she has been doing well with her fluid status since middle of February Still with SOB with minimal exertion.  Scheduled to have pulmonary function test in May follow-up with pulmonary in June  trouble walking into the grocery store   needs to take wheelchair or use a scooter. Unable to participate in pulmonary rehabilitation given severe chronic knee pain We have recommended water aerobics, she has not participated  Denies any leg swelling, no PND or orthopnea.  Feels fine at rest, severe symptoms on minimal exertion..  Previous echocardiogram 2016 showed mild to moderate aortic valve stenosis, normal EF, normal right heart pressures  Last stress test 2014  Weight is stable  Last visit, We did walk in the office, rate response was appropriate, hit 98 bpm walking down the hall, no drop in oxygen saturation, maintained for the 95%. She was very short of breath, had difficulty walking back to the room, had to walk very slowly, take breaks  EKG personally reviewed by myself on todays visit shows ventricularly paced rhythm rate 71 bpm,   Other past medical  history reviewed Previous Echo 07/2014: Reviewed with her EF 60%, normal RVSP, mild to moderate AS  Typically takes Lasix 20 mg daily, sometimes 20 mg twice a day  unable to participate in pulmonary rehabilitation secondary to chronic knee pain, osteoarthritis. Weight and conditioning is a major issue.  echocardiogram shows normal LV function, stable valve disease, normal right ventricular systolic pressures.  Previous visit to the emergency room for atypical chest pain. Cardiac enzymes were negative and she was discharged home Prior workup with echocardiogram in 2014 and stress test were unrevealing.   sleep study in October and again in November. Sleep study in October suggested she repeat the test as it was incomplete. The note indicates the study was discontinued as the patient had heart block, with limited EKG monitoring appearing to demonstrate a 43 second period of complete heart block with ventricular standstill. Atrial rate was 80 beats per minute. Note indicates the patient was awake and alert during the event with some dizziness. Pacer was checked after this event,  Normal functioning device.  Followup sleep study 04/24/2013 recommended weight loss, though there was no indications for CPAP at this time. Previous stress test  early 2014 showed no ischemia  PMH:   has a past medical history of Abnormal echocardiogram (09/2009); Allergic to IV contrast; Chronic diastolic CHF (congestive heart failure) ([place]); Complete heart block-S./P. AV junction ablation (11/23/2010); GERD (gastroesophageal reflux disease); Hyperlipidemia; Hypertension; Hypothyroidism; Migraine; OSA (obstructive sleep apnea); Pacemaker-Medtronic CRT P. (11/23/2010); PAD (peripheral artery disease) ([place]); and Paroxysmal atrial fibrillation ([place]).  PSH:    Past Surgical History:  Procedure Laterality Date  . APPENDECTOMY    . ATRIAL ABLATION SURGERY  06/12/2010  . CARDIAC CATHETERIZATION     Left heart cath x3 in  teh past, last 5 yrs ago. All "normal" per pt report  . CARDIOVERSION    . CARPAL TUNNEL RELEASE    . CHOLECYSTECTOMY OPEN    . COLONOSCOPY WITH PROPOFOL N/A 09/08/2015   Procedure: COLONOSCOPY WITH PROPOFOL;  Surgeon: Josefine Class, MD;  Location: [facility] ENDOSCOPY;  Service: Endoscopy;  Laterality: N/A;  . ESOPHAGOGASTRODUODENOSCOPY (EGD) WITH PROPOFOL N/A 09/08/2015   Procedure: ESOPHAGOGASTRODUODENOSCOPY (EGD) WITH PROPOFOL;  Surgeon: Josefine Class, MD;  Location: [facility] ENDOSCOPY;  Service: Endoscopy;  Laterality: N/A;  . PACEMAKER INSERTION  06/12/2010  . VESICOVAGINAL FISTULA CLOSURE W/ TAH      Current Outpatient Prescriptions  Medication Sig Dispense Refill  . ELIQUIS 5 MG TABS tablet TAKE ONE TABLET BY MOUTH TWICE DAILY 60 tablet 3  . famotidine (PEPCID) 10 MG tablet Take 10 mg by mouth daily as needed. Reported on 08/19/2015    . furosemide (LASIX) 20 MG tablet Take 1 tablet (20 mg total) by mouth 2 (two) times daily as needed. (Patient taking differently: Take 20 mg by mouth 2 (two) times daily as needed. Taking 1 tablet 20 mg daily and taking 2nd tablet only when needed) 180 tablet 3  . furosemide (LASIX) 40 MG tablet Take 1 tablet (40 mg total) by mouth 2 (two) times daily. 180 tablet 3  . GuaiFENesin (MUCINEX PO) Take by mouth as needed.    Marland Kitchen KLOR-CON M10 10 MEQ tablet TAKE ONE TABLET BY MOUTH ONCE DAILY 90 tablet 3  . levothyroxine (SYNTHROID, LEVOTHROID) 100 MCG tablet Take 100 mcg by mouth daily before breakfast.    . meclizine (ANTIVERT) 25 MG tablet Take 1 tablet (25 mg total) by mouth 3 (three) times daily as needed. 90 tablet 1  . metoprolol (LOPRESSOR) 50 MG tablet TAKE ONE & ONE-HALF TABLETS BY MOUTH TWICE DAILY 270 tablet 3  . nitroGLYCERIN (NITROSTAT) 0.4 MG SL tablet Place 1 tablet (0.4 mg total) under the tongue every 5 (five) minutes as needed for chest pain. 25 tablet 3  . prochlorperazine (COMPAZINE) 5 MG tablet Take 1 tablet (5 mg total) by mouth every 6  (six) hours as needed. 90 tablet 5  . VITAMIN B1-B12 IM Inject into the muscle every 30 (thirty) days.     No current facility-administered medications for this visit.      Allergies:   Ivp dye [iodinated diagnostic agents]; Codeine; Iodine; Morphine; Oxycodone-acetaminophen; Persantine [dipyridamole]; Statins; Sulfonamide derivatives; and Amlodipine   Social History:  The patient  reports that she has never smoked. She has never used smokeless tobacco. She reports that she does not drink alcohol or use drugs.   Family History:   family history includes Colon cancer in her daughter; Heart attack in her father and mother.    Review of Systems: Review of Systems  Constitutional: Negative.   Respiratory: Positive for shortness of breath.   Cardiovascular: Negative.   Gastrointestinal: Negative.   Musculoskeletal: Negative.   Neurological: Negative.   Psychiatric/Behavioral: Negative.   All other systems reviewed and are negative.    PHYSICAL EXAM: VS:  There were no vitals taken for this visit. , BMI There is no height or weight on file to calculate BMI. GEN: Well nourished, well developed, in no acute distress, obese  HEENT: normal  Neck: no JVD, carotid bruits, or masses Cardiac: RRR; no murmurs,  rubs, or gallops,no edema  Respiratory:  clear to auscultation bilaterally, normal work of breathing GI: soft, nontender, nondistended, + BS MS: no deformity or atrophy  Skin: warm and dry, no rash Neuro:  Strength and sensation are intact Psych: euthymic mood, full affect    Recent Labs: No results found for requested labs within last 8760 hours.    Lipid Panel Lab Results  Component Value Date   CHOL 294 (H) 09/30/2009   HDL 44 09/30/2009   LDLCALC See Comment mg/dL 09/30/2009   TRIG 459 (H) 09/30/2009      Wt Readings from Last 3 Encounters:  08/10/16 214 lb (97.1 kg)  04/06/16 212 lb (96.2 kg)  03/24/16 213 lb (96.6 kg)       ASSESSMENT AND PLAN:  Atrial  fibrillation, unspecified type ([place]) - Plan: EKG 12-Lead On anticoagulation, Ventricularly paced  Shortness of breath Etiology unclear though likely multifactorial Very short of breath with minimal exertion walking down the hallway Rate response seems to be intact with rate up to 98 bpm with exertion in the hallway optivol is low, no improvement in symptoms with Lasix She is scheduled for pulmonary function test, follow-up with pulmonary Unable to exercise given chronic knee pain per the patient She does have aortic valve stenosis Did not severe We did discuss repeating her stress test But this has not been ordered   Pacemaker-Medtronic CRT P. Managed by Dr. Caryl Comes.   Chronic diastolic CHF (congestive heart failure) ([place]) Recommended she continue on 20 mg daily, 40 mg for any ankle swelling  Complete heart block-S./P. AV junction ablation   Total encounter time more than 25 minutes  Greater than 50% was spent in counseling and coordination of care with the patient   Disposition:   F/U  6 months   No orders of the defined types were placed in this encounter.    Signed, Esmond Plants, M.D., Ph.D. 09/19/2016  [place], [place]

## 2016-09-20 ENCOUNTER — Telehealth: Payer: Self-pay

## 2016-09-20 ENCOUNTER — Ambulatory Visit (INDEPENDENT_AMBULATORY_CARE_PROVIDER_SITE_OTHER): Payer: Medicare Other | Admitting: Cardiovascular Disease

## 2016-09-20 ENCOUNTER — Encounter: Payer: Self-pay | Admitting: Cardiovascular Disease

## 2016-09-20 ENCOUNTER — Ambulatory Visit (INDEPENDENT_AMBULATORY_CARE_PROVIDER_SITE_OTHER): Payer: Medicare Other

## 2016-09-20 VITALS — BP 140/72 | HR 71 | Ht 63.0 in | Wt 214.8 lb

## 2016-09-20 DIAGNOSIS — Z7901 Long term (current) use of anticoagulants: Secondary | ICD-10-CM

## 2016-09-20 DIAGNOSIS — I5032 Chronic diastolic (congestive) heart failure: Secondary | ICD-10-CM

## 2016-09-20 DIAGNOSIS — R0602 Shortness of breath: Secondary | ICD-10-CM | POA: Diagnosis not present

## 2016-09-20 DIAGNOSIS — I4821 Permanent atrial fibrillation: Secondary | ICD-10-CM

## 2016-09-20 DIAGNOSIS — Z950 Presence of cardiac pacemaker: Secondary | ICD-10-CM

## 2016-09-20 DIAGNOSIS — I442 Atrioventricular block, complete: Secondary | ICD-10-CM

## 2016-09-20 DIAGNOSIS — E782 Mixed hyperlipidemia: Secondary | ICD-10-CM

## 2016-09-20 DIAGNOSIS — I482 Chronic atrial fibrillation: Secondary | ICD-10-CM | POA: Diagnosis not present

## 2016-09-20 DIAGNOSIS — I10 Essential (primary) hypertension: Secondary | ICD-10-CM | POA: Diagnosis not present

## 2016-09-20 NOTE — Telephone Encounter (Signed)
ICM call attempted.  Left message to send remote transmission this morning before her office visit with Dr Rockey Situ today.

## 2016-09-20 NOTE — Progress Notes (Signed)
EPIC Encounter for ICM Monitoring  Patient Name: Jacqueline Lozano is a 82 y.o. female Date: 09/20/2016 Primary Care Physican: [name], MD Primary Cardiologist:[name] Electrophysiologist: [name] Weight:Does not weigh Bi-V Pacing: 98.5%           Reviewed with patient at Dr [name] office visit today.    Thoracic impedance slightly abnormal suggesting fluid accumulation since 09/13/2016.  Prescribed dosage: Furosemide 20 mg 1 tablet twice a day as needed but patient is taking 1 tablet 20 mg daily and taking 2nd tablet only when needed.  Klor Con 10 mEq 1 tablet daily  Recommendations: Addressed at time of office visit.   Follow-up plan: ICM clinic phone appointment on 10/21/2016.  Office appointment today with Dr Rockey Situ.   Copy of ICM check sent to primary cardiologist and device physician.   3 month ICM trend: 09/20/2016   1 Year ICM trend:      Rosalene Billings, RN 09/20/2016 9:40 AM

## 2016-09-20 NOTE — Patient Instructions (Addendum)
After the lung study, Call the office, We might need to do a stress test   Medication Instructions:   No medication changes made  Labwork:  No new labs needed  Testing/Procedures:  No further testing at this time   I recommend watching educational videos on topics of interest to you at:       [email or web address]  Enter code: [email or web address]    Follow-Up: It was a pleasure seeing you in the office today. Please call us if you have new issues that need to be addressed before your next appt.  [phone]  Your physician wants you to follow-up in: 6 months.  You will receive a reminder letter in the mail two months in advance. If you don't receive a letter, please call our office to schedule the follow-up appointment.  If you need a refill on your cardiac medications before your next appointment, please call your pharmacy.

## 2016-10-04 ENCOUNTER — Telehealth: Payer: Self-pay | Admitting: Cardiovascular Disease

## 2016-10-04 NOTE — Telephone Encounter (Signed)
Pt wanted to let us know that she had her BMP drawn at Dr. Verneda Skill office, so we can get the results. [phone]

## 2016-10-18 ENCOUNTER — Telehealth: Payer: Self-pay | Admitting: Internal Medicine

## 2016-10-18 NOTE — Telephone Encounter (Signed)
Pt calling about her upcoming PFT  She needs to know what to do and what not to do before test Please call back

## 2016-10-18 NOTE — Telephone Encounter (Signed)
R/T call patient wanted to know if she could eat breakfast and take her regular medications. Informed patient yes. Patient is not on any inhalers. Nothing further needed.

## 2016-10-21 ENCOUNTER — Ambulatory Visit (INDEPENDENT_AMBULATORY_CARE_PROVIDER_SITE_OTHER): Payer: Medicare Other | Admitting: *Deleted

## 2016-10-21 DIAGNOSIS — I5032 Chronic diastolic (congestive) heart failure: Secondary | ICD-10-CM | POA: Diagnosis not present

## 2016-10-21 DIAGNOSIS — Z950 Presence of cardiac pacemaker: Secondary | ICD-10-CM

## 2016-10-21 DIAGNOSIS — I442 Atrioventricular block, complete: Secondary | ICD-10-CM

## 2016-10-21 LAB — CUP PACEART REMOTE DEVICE CHECK
Battery Remaining Longevity: 23 mo
Brady Statistic AP VS Percent: 0 %
Brady Statistic AS VP Percent: 98.14 %
Brady Statistic AS VS Percent: 1.86 %
Brady Statistic RA Percent Paced: 0 %
Brady Statistic RV Percent Paced: 98.29 %
Implantable Lead Implant Date: 20120106
Implantable Lead Location: 753858
Implantable Lead Model: 5076
Lead Channel Impedance Value: 4047 Ohm
Lead Channel Impedance Value: 4047 Ohm
Lead Channel Impedance Value: 418 Ohm
Lead Channel Impedance Value: 475 Ohm
Lead Channel Impedance Value: 570 Ohm
Lead Channel Impedance Value: 684 Ohm
Lead Channel Impedance Value: 703 Ohm
Lead Channel Pacing Threshold Amplitude: 0.75 V
Lead Channel Pacing Threshold Amplitude: 1.875 V
Lead Channel Sensing Intrinsic Amplitude: 5.125 mV
Lead Channel Setting Sensing Sensitivity: 0.9 mV
MDC IDC LEAD IMPLANT DT: 20120106
MDC IDC LEAD LOCATION: 753860
MDC IDC MSMT BATTERY VOLTAGE: 2.9 V
MDC IDC MSMT LEADCHNL LV IMPEDANCE VALUE: 1026 Ohm
MDC IDC MSMT LEADCHNL LV IMPEDANCE VALUE: 779 Ohm
MDC IDC MSMT LEADCHNL LV PACING THRESHOLD PULSEWIDTH: 1 ms
MDC IDC MSMT LEADCHNL RV PACING THRESHOLD PULSEWIDTH: 0.4 ms
MDC IDC MSMT LEADCHNL RV SENSING INTR AMPL: 5.125 mV
MDC IDC PG IMPLANT DT: 20120106
MDC IDC SESS DTM: 20180517123104
MDC IDC SET LEADCHNL RV PACING AMPLITUDE: 2.5 V
MDC IDC SET LEADCHNL RV PACING PULSEWIDTH: 0.4 ms
MDC IDC STAT BRADY AP VP PERCENT: 0 %

## 2016-10-21 NOTE — Progress Notes (Signed)
Remote pacemaker transmission.   

## 2016-10-21 NOTE — Progress Notes (Signed)
EPIC Encounter for ICM Monitoring  Patient Name: Jacqueline Lozano is a 82 y.o. female Date: 10/21/2016 Primary Care Physican: [name], MD Primary Cardiologist:[name] Electrophysiologist: [name] Weight:Does not weigh Bi-V Pacing: 98.3%          Heart Failure questions reviewed, pt asymptomatic.   Thoracic impedance normal.  Prescribed dosage: Furosemide 20 mg 1 tablet twice a day as needed but patient is taking 1 tablet 20 mg daily and taking 2nd tablet only when needed.  Klor Con 10 mEq 1 tablet daily  Recommendations: No changes. Discussed to limit salt intake to 2000 mg/day and fluid intake to < 2 liters/day.  Encouraged to call for fluid symptoms or use local ER for any urgent symptoms.  Follow-up plan: ICM clinic phone appointment on 11/23/2016.    Copy of ICM check sent to device physician.   3 month ICM trend: 10/21/2016   1 Year ICM trend:      Rosalene Billings, RN 10/21/2016 1:19 PM

## 2016-10-22 ENCOUNTER — Encounter: Payer: Self-pay | Admitting: Cardiology

## 2016-11-02 ENCOUNTER — Ambulatory Visit: Payer: Medicare Other | Attending: Internal Medicine

## 2016-11-02 DIAGNOSIS — R0602 Shortness of breath: Secondary | ICD-10-CM | POA: Diagnosis not present

## 2016-11-02 MED ORDER — ALBUTEROL SULFATE (2.5 MG/3ML) 0.083% IN NEBU
2.5000 mg | INHALATION_SOLUTION | Freq: Once | RESPIRATORY_TRACT | Status: AC
  Administered 2016-11-02: 2.5 mg via RESPIRATORY_TRACT
  Filled 2016-11-02: qty 3

## 2016-11-04 ENCOUNTER — Telehealth: Payer: Self-pay

## 2016-11-04 NOTE — Telephone Encounter (Signed)
ICM call to patient as requested by voice mail.  Patient said she got a letter about remote transmission scheduled for August.  Explained that the reports will continue monthly and in August the device nurses will review everything on the report and I will continue to check fluid levels monthly.  Next ICM remote transmission 11/23/2016.

## 2016-11-08 ENCOUNTER — Encounter: Payer: Self-pay | Admitting: Internal Medicine

## 2016-11-08 ENCOUNTER — Ambulatory Visit (INDEPENDENT_AMBULATORY_CARE_PROVIDER_SITE_OTHER): Payer: Medicare Other | Admitting: Internal Medicine

## 2016-11-08 VITALS — BP 134/76 | HR 89 | Resp 16 | Ht 63.0 in | Wt 216.0 lb

## 2016-11-08 DIAGNOSIS — I5032 Chronic diastolic (congestive) heart failure: Secondary | ICD-10-CM

## 2016-11-08 NOTE — Progress Notes (Signed)
[facility] Consultation      Date: 11/08/2016,   MRN# [id number] Jacqueline Lozano [birth year] Code Status:  Code Status History    This patient does not have a recorded code status. Please follow your organizational policy for patients in this situation.     Hosp day:@LENGTHOFSTAYDAYS @ Referring MD: @ATDPROV @     PCP:      AdmissionWeight: 216 lb (98 kg)                 CurrentWeight: 216 lb (98 kg) Jacqueline Lozano is a 82 y.o. old female seen in consultation for SOB at the request of Dr. Rockey Situ.     CHIEF COMPLAINT:   chronic SOB/DOE   HISTORY OF PRESENT ILLNESS  82 yo obese white female with SOB/DOe Stable  at this time SOB has been there for many years, SOB worsens with exertion Patient with h/o extensive CAD with Diastolic heart failure s/p Pacemaker with Aortic stenosis She has no cough and no wheezing She is nonsmoker  She had sleep study 1 year ago and reports that it was WNL and does NOT want another one She also has ahd a cxr in PCP' office and she reports it was WNL and does not want another one She has no signs of pneumonia at this time, no symptoms of URI  Breo DID NOT help her SOB as I presumed Ambulating pulse ox in office was WNL no evidence of hypoxia ONO was negative for hypoxia  PFT's shows restrictive lung disease likely from her obseity  She has no signs o Current Medication:  Current Outpatient Prescriptions:  .  ELIQUIS 5 MG TABS tablet, TAKE ONE TABLET BY MOUTH TWICE DAILY, Disp: 60 tablet, Rfl: 3 .  famotidine (PEPCID) 10 MG tablet, Take 10 mg by mouth daily as needed. Reported on 08/19/2015, Disp: , Rfl:  .  furosemide (LASIX) 20 MG tablet, Take 1 tablet (20 mg total) by mouth 2 (two) times daily as needed. (Patient taking differently: Take 20 mg by mouth 2 (two) times daily as needed. Taking 1 tablet 20 mg daily and taking 2nd tablet only when needed), Disp: 180 tablet, Rfl: 3 .  furosemide (LASIX) 40 MG tablet, Take  1 tablet (40 mg total) by mouth 2 (two) times daily., Disp: 180 tablet, Rfl: 3 .  GuaiFENesin (MUCINEX PO), Take by mouth as needed., Disp: , Rfl:  .  KLOR-CON M10 10 MEQ tablet, TAKE ONE TABLET BY MOUTH ONCE DAILY, Disp: 90 tablet, Rfl: 3 .  levothyroxine (SYNTHROID, LEVOTHROID) 88 MCG tablet, Take 88 mcg by mouth daily., Disp: , Rfl:  .  meclizine (ANTIVERT) 25 MG tablet, Take 1 tablet (25 mg total) by mouth 3 (three) times daily as needed., Disp: 90 tablet, Rfl: 1 .  metoprolol (LOPRESSOR) 50 MG tablet, TAKE ONE & ONE-HALF TABLETS BY MOUTH TWICE DAILY, Disp: 270 tablet, Rfl: 3 .  nitroGLYCERIN (NITROSTAT) 0.4 MG SL tablet, Place 1 tablet (0.4 mg total) under the tongue every 5 (five) minutes as needed for chest pain., Disp: 25 tablet, Rfl: 3 .  prochlorperazine (COMPAZINE) 5 MG tablet, Take 1 tablet (5 mg total) by mouth every 6 (six) hours as needed., Disp: 90 tablet, Rfl: 5 .  VITAMIN B1-B12 IM, Inject into the muscle every 30 (thirty) days., Disp: , Rfl:     ALLERGIES   Ivp dye [iodinated diagnostic agents]; Codeine; Iodine; Morphine; Oxycodone-acetaminophen; Oxycodone-acetaminophen; Persantine [dipyridamole]; Shellfish-derived products; Statins; Sulfa antibiotics; Sulfonamide derivatives; and Amlodipine  REVIEW OF SYSTEMS   Review of Systems  Constitutional: Negative for chills, diaphoresis, fever, malaise/fatigue and weight loss.  HENT: Negative for congestion.   Respiratory: Positive for shortness of breath. Negative for cough, hemoptysis, sputum production and wheezing.   Cardiovascular: Positive for orthopnea and leg swelling. Negative for chest pain and palpitations.  Musculoskeletal: Positive for joint pain.  Neurological: Negative for weakness.  All other systems reviewed and are negative.   BP 134/76 (BP Location: Left Arm, Cuff Size: Normal)   Pulse 89   Resp 16   Ht 5\' 3"  (1.6 m)   Wt 216 lb (98 kg)   SpO2 97%   BMI 38.26 kg/m    PHYSICAL EXAM  Physical  Exam  Constitutional: She is oriented to person, place, and time. She appears well-developed and well-nourished. No distress.  Cardiovascular: Normal rate, regular rhythm and normal heart sounds.   No murmur heard. Pulmonary/Chest: Effort normal and breath sounds normal. No stridor. No respiratory distress. She has no wheezes. She has no rales.  Musculoskeletal: Normal range of motion. She exhibits edema.  Neurological: She is alert and oriented to person, place, and time. No cranial nerve deficit.  Skin: Skin is warm. She is not diaphoretic.  Psychiatric: She has a normal mood and affect.       IMAGING   I have Independently reviewed images of CXR on 06/2104 on  11/08/2016 Interpretation:no acute process, no effusions      ASSESSMENT/PLAN   82 yo pleasant white female with SOB and DOE with multiple medical issues.  Overall, her resp status is compromised by her cardiac diastolic dysfunction and aortic stenosis in the setting of morbid obesity. Her SOB will continue to decline as she ages  inhaler therapy does not help. She does not have any evidence of hypoxia.    1.SOB/DOE from her cardiac disease and obesity 2.Cardiac disease-follow up cardiology as scheduled, lasix as prescribed 3.deconditioned state-needs to do more exercise 4.obesity-will need to lose significant amount of weight in order to improve her resp status   Patient states that she has had a sleep study and CXR already and does not want to repeat any at this time.  Patient satisfied with Plan of action and management. All questions answered There is nothing else from Pulmonary point of view I can add to this patient at this time Follow up in 1 year  Corrin Parker, M.D.  [facility]  Medical Director [place] Director [facility] Cardio-Pulmonary Department

## 2016-11-08 NOTE — Patient Instructions (Signed)
Follow up in 9 months

## 2016-11-23 ENCOUNTER — Ambulatory Visit (INDEPENDENT_AMBULATORY_CARE_PROVIDER_SITE_OTHER): Payer: Medicare Other

## 2016-11-23 DIAGNOSIS — Z950 Presence of cardiac pacemaker: Secondary | ICD-10-CM | POA: Diagnosis not present

## 2016-11-23 DIAGNOSIS — I5032 Chronic diastolic (congestive) heart failure: Secondary | ICD-10-CM | POA: Diagnosis not present

## 2016-11-23 NOTE — Progress Notes (Signed)
EPIC Encounter for ICM Monitoring  Patient Name: SHANIKQUA ZARZYCKI is a 82 y.o. female Date: 11/23/2016 Primary Care Physican: [name], MD Primary Cardiologist:[name] Electrophysiologist: [name] Weight:Does not weigh Bi-V Pacing: 98.9%      Heart Failure questions reviewed, pt asymptomatic.   Thoracic impedance normal.  Prescribed dosage: Furosemide 20 mg 1 tablet twice a day as needed but patient is taking 1 tablet 20 mg daily and taking 2nd tablet only when needed. Klor Con 10 mEq 1 tablet daily  Recommendations: No changes.   Encouraged to call for fluid symptoms.  Follow-up plan: ICM clinic phone appointment on 12/24/2016.  Office appointment scheduled on 12/21/2016 with Dr. Rockey Situ.  Copy of ICM check sent to device physician.   3 month ICM trend: 11/23/2016   1 Year ICM trend:      Rosalene Billings, RN 11/23/2016 10:09 AM

## 2016-12-02 ENCOUNTER — Other Ambulatory Visit: Payer: Self-pay | Admitting: Cardiovascular Disease

## 2016-12-16 ENCOUNTER — Telehealth: Payer: Self-pay | Admitting: Cardiovascular Disease

## 2016-12-16 NOTE — Telephone Encounter (Signed)
That is absolutely fine. The only reason we called to r/s is b/c she was just seen in April and advised at that time to f/u in 6 mos.

## 2016-12-16 NOTE — Telephone Encounter (Signed)
Patient is aware her 43 m fu is early but she is having some issues she wants to discuss and has been waiting for a while.   Patient stated she was called to r/s a later time  .  Patient doesn't want to r/s she will come in for appt to discuss issues.

## 2016-12-17 NOTE — Progress Notes (Signed)
Cardiology Office Note  Date:  12/17/2016   ID:  DARI CARPENITO, DOB 01-19-1935, MRN [id number]  PCP:  [name], MD   No chief complaint on file.   HPI:  Mrs Mcilwain is a pleasant 82 -year-old woman with  morbid obesity, deconditioning Chronic bilateral knee pain who has declined joint surgery Chronic shortness of breath atrial fibrillation,  status post arteriovenous node ablation, Pacer/CRT-P,  Hypertension,  Paroxysmal nocturnal dyspnea,  Hypothyroidism,  Diastolic congestive heart failure,  C. difficile infection,  Previous pacemaker interrogation shows that she is  ventricularly paced Followed by Dr. Caryl Comes Echo 07/2014: mild to mo AS who presents for followup of her chronic shortness of breath.  In follow-up today we talked about her shortness of breath She reports it is still getting worse No regular exercise, uses a scooter and wheelchair Walker in the house No recent falls  Recently seen by pulmonary PFT's shows restrictive lung disease likely from her obseity  Unable to participate in pulmonary rehabilitation given severe chronic knee pain We have recommended water aerobics, she has not participated  Previously on symbicort, unclear if this is making a difference, living off samples in the past  Stopped, expensive  On device check , optivol is not particularly elevated   Denies any leg swelling, no PND or orthopnea.  Feels fine at rest,  symptoms on minimal exertion..  EKG personally reviewed by myself on todays visit Shows paced rhythm 72 bpm  Other past medical history reviewed  Previous echocardiogram 2016 showed mild to moderate aortic valve stenosis, normal EF, normal right heart pressures  Last stress test 2014  Weight is stable On previous visit,e did walk in the office, rate response was appropriate, hit 98 bpm walking down the hall, no drop in oxygen saturation, maintained for the 95%. She was very short of breath, had difficulty  walking back to the room, had to walk very slowly, take breaks  Previous Echo 07/2014: Reviewed with her EF 60%, normal RVSP, mild to moderate AS  Typically takes Lasix 20 mg daily, sometimes 20 mg twice a day  unable to participate in pulmonary rehabilitation secondary to chronic knee pain, osteoarthritis. Weight and conditioning is a major issue.  echocardiogram shows normal LV function, stable valve disease, normal right ventricular systolic pressures.  Previous visit to the emergency room for atypical chest pain. Cardiac enzymes were negative and she was discharged home Prior workup with echocardiogram in 2014 and stress test were unrevealing.   sleep study in October and again in November. Sleep study in October suggested she repeat the test as it was incomplete. The note indicates the study was discontinued as the patient had heart block, with limited EKG monitoring appearing to demonstrate a 43 second period of complete heart block with ventricular standstill. Atrial rate was 80 beats per minute. Note indicates the patient was awake and alert during the event with some dizziness. Pacer was checked after this event,  Normal functioning device.  Followup sleep study 04/24/2013 recommended weight loss, though there was no indications for CPAP at this time. Previous stress test  early 2014 showed no ischemia  PMH:   has a past medical history of Abnormal echocardiogram (09/2009); Allergic to IV contrast; Chronic diastolic CHF (congestive heart failure) ([place]); Complete heart block-S./P. AV junction ablation (11/23/2010); GERD (gastroesophageal reflux disease); Hyperlipidemia; Hypertension; Hypothyroidism; Migraine; OSA (obstructive sleep apnea); Pacemaker-Medtronic CRT P. (11/23/2010); PAD (peripheral artery disease) ([place]); and Paroxysmal atrial fibrillation ([place]).  PSH:    Past Surgical  History:  Procedure Laterality Date  . APPENDECTOMY    . ATRIAL ABLATION SURGERY  06/12/2010  .  CARDIAC CATHETERIZATION     Left heart cath x3 in teh past, last 5 yrs ago. All "normal" per pt report  . CARDIOVERSION    . CARPAL TUNNEL RELEASE    . CHOLECYSTECTOMY OPEN    . COLONOSCOPY WITH PROPOFOL N/A 09/08/2015   Procedure: COLONOSCOPY WITH PROPOFOL;  Surgeon: Josefine Class, MD;  Location: [facility] ENDOSCOPY;  Service: Endoscopy;  Laterality: N/A;  . ESOPHAGOGASTRODUODENOSCOPY (EGD) WITH PROPOFOL N/A 09/08/2015   Procedure: ESOPHAGOGASTRODUODENOSCOPY (EGD) WITH PROPOFOL;  Surgeon: Josefine Class, MD;  Location: [facility] ENDOSCOPY;  Service: Endoscopy;  Laterality: N/A;  . PACEMAKER INSERTION  06/12/2010  . VESICOVAGINAL FISTULA CLOSURE W/ TAH      Current Outpatient Prescriptions  Medication Sig Dispense Refill  . ELIQUIS 5 MG TABS tablet TAKE 1 TABLET BY MOUTH TWICE DAILY 60 tablet 3  . famotidine (PEPCID) 10 MG tablet Take 10 mg by mouth daily as needed. Reported on 08/19/2015    . furosemide (LASIX) 20 MG tablet Take 1 tablet (20 mg total) by mouth 2 (two) times daily as needed. (Patient taking differently: Take 20 mg by mouth 2 (two) times daily as needed. Taking 1 tablet 20 mg daily and taking 2nd tablet only when needed) 180 tablet 3  . furosemide (LASIX) 40 MG tablet Take 1 tablet (40 mg total) by mouth 2 (two) times daily. 180 tablet 3  . GuaiFENesin (MUCINEX PO) Take by mouth as needed.    Marland Kitchen KLOR-CON M10 10 MEQ tablet TAKE ONE TABLET BY MOUTH ONCE DAILY 90 tablet 3  . levothyroxine (SYNTHROID, LEVOTHROID) 88 MCG tablet Take 88 mcg by mouth daily.    . meclizine (ANTIVERT) 25 MG tablet Take 1 tablet (25 mg total) by mouth 3 (three) times daily as needed. 90 tablet 1  . metoprolol (LOPRESSOR) 50 MG tablet TAKE ONE & ONE-HALF TABLETS BY MOUTH TWICE DAILY 270 tablet 3  . nitroGLYCERIN (NITROSTAT) 0.4 MG SL tablet Place 1 tablet (0.4 mg total) under the tongue every 5 (five) minutes as needed for chest pain. 25 tablet 3  . prochlorperazine (COMPAZINE) 5 MG tablet Take 1 tablet (5  mg total) by mouth every 6 (six) hours as needed. 90 tablet 5  . VITAMIN B1-B12 IM Inject into the muscle every 30 (thirty) days.     No current facility-administered medications for this visit.      Allergies:   Ivp dye [iodinated diagnostic agents]; Codeine; Iodine; Morphine; Oxycodone-acetaminophen; Oxycodone-acetaminophen; Persantine [dipyridamole]; Shellfish-derived products; Statins; Sulfa antibiotics; Sulfonamide derivatives; and Amlodipine   Social History:  The patient  reports that she has never smoked. She has never used smokeless tobacco. She reports that she does not drink alcohol or use drugs.   Family History:   family history includes Colon cancer in her daughter; Heart attack in her father and mother.    Review of Systems: Review of Systems  Constitutional: Negative.   Respiratory: Positive for shortness of breath.   Cardiovascular: Negative.   Gastrointestinal: Negative.   Musculoskeletal: Negative.   Neurological: Negative.   Psychiatric/Behavioral: Negative.   All other systems reviewed and are negative.    PHYSICAL EXAM: VS:  There were no vitals taken for this visit. , BMI There is no height or weight on file to calculate BMI. GEN: Well nourished, well developed, in no acute distress, obese  HEENT: normal  Neck: no JVD, carotid bruits, or  masses Cardiac: RRR; no murmurs, rubs, or gallops,no edema  Respiratory:  clear to auscultation bilaterally, normal work of breathing GI: soft, nontender, nondistended, + BS MS: no deformity or atrophy  Skin: warm and dry, no rash Neuro:  Strength and sensation are intact Psych: euthymic mood, full affect    Recent Labs: No results found for requested labs within last 8760 hours.    Lipid Panel Lab Results  Component Value Date   CHOL 294 (H) 09/30/2009   HDL 44 09/30/2009   LDLCALC See Comment mg/dL 09/30/2009   TRIG 459 (H) 09/30/2009      Wt Readings from Last 3 Encounters:  11/08/16 216 lb (98 kg)   09/20/16 214 lb 12 oz (97.4 kg)  08/10/16 214 lb (97.1 kg)       ASSESSMENT AND PLAN:  Atrial fibrillation, unspecified type ([place]) - Plan: EKG 12-Lead On anticoagulation, Ventricularly paced  Shortness of breath Very short of breath with minimal exertion walking down the hallway Previous testing unrevealing PFTs with restrictive lung disease from obesity Unable to exercise or lose weight effectively given chronic knee pain Long discussion about living with her symptoms  Pacemaker-Medtronic CRT P. Managed by Dr. Caryl Comes.   Chronic diastolic CHF (congestive heart failure) ([place]) Recommended she continue on 20 mg daily, 40 mg for any ankle swelling  Complete heart block-S./P. AV junction ablation   Total encounter time more than 25 minutes  Greater than 50% was spent in counseling and coordination of care with the patient   Disposition:   F/U  12 months   No orders of the defined types were placed in this encounter.    Signed, Esmond Plants, M.D., Ph.D. 12/17/2016  [place], [place]

## 2016-12-21 ENCOUNTER — Encounter: Payer: Self-pay | Admitting: Cardiovascular Disease

## 2016-12-21 ENCOUNTER — Ambulatory Visit (INDEPENDENT_AMBULATORY_CARE_PROVIDER_SITE_OTHER): Payer: Medicare Other | Admitting: Cardiovascular Disease

## 2016-12-21 VITALS — BP 130/66 | HR 72 | Ht 62.0 in | Wt 218.5 lb

## 2016-12-21 DIAGNOSIS — Z950 Presence of cardiac pacemaker: Secondary | ICD-10-CM

## 2016-12-21 DIAGNOSIS — E782 Mixed hyperlipidemia: Secondary | ICD-10-CM | POA: Diagnosis not present

## 2016-12-21 DIAGNOSIS — I5032 Chronic diastolic (congestive) heart failure: Secondary | ICD-10-CM

## 2016-12-21 DIAGNOSIS — I10 Essential (primary) hypertension: Secondary | ICD-10-CM | POA: Diagnosis not present

## 2016-12-21 DIAGNOSIS — R0602 Shortness of breath: Secondary | ICD-10-CM

## 2016-12-21 DIAGNOSIS — I482 Chronic atrial fibrillation: Secondary | ICD-10-CM

## 2016-12-21 DIAGNOSIS — I4821 Permanent atrial fibrillation: Secondary | ICD-10-CM

## 2016-12-21 DIAGNOSIS — Z7901 Long term (current) use of anticoagulants: Secondary | ICD-10-CM

## 2016-12-21 NOTE — Patient Instructions (Addendum)

## 2016-12-24 ENCOUNTER — Ambulatory Visit (INDEPENDENT_AMBULATORY_CARE_PROVIDER_SITE_OTHER): Payer: Medicare Other

## 2016-12-24 DIAGNOSIS — Z950 Presence of cardiac pacemaker: Secondary | ICD-10-CM

## 2016-12-24 DIAGNOSIS — I5032 Chronic diastolic (congestive) heart failure: Secondary | ICD-10-CM | POA: Diagnosis not present

## 2016-12-24 NOTE — Progress Notes (Signed)
EPIC Encounter for ICM Monitoring  Patient Name: Jacqueline Lozano is a 82 y.o. female Date: 12/24/2016 Primary Care Physican: [name], MD Primary Cardiologist:[name] Electrophysiologist: [name] Weight:Does not weigh Bi-V Pacing: 99.2%       Heart Failure questions reviewed, pt asymptomatic today but did have some swelling of foot last week.  She took extra Furosemide last week   Thoracic impedance abnormal suggesting fluid accumulation starting 12/04/2016 until today, 12/24/2016 and almost at baseline.  Prescribed dosage: Furosemide 20 mg 1 tablet twice a day as needed but patient is taking 1 tablet 20 mg daily and taking 2nd tablet only when needed. Klor Con 10 mEq 1 tablet daily  Recommendations: No changes.  Advised to limit salt intake to 2000 mg/day and fluid intake to < 2 liters/day.  Encouraged to call for fluid symptoms.  Follow-up plan: ICM clinic phone appointment on 01/24/2017.   Copy of ICM check sent to [place] cardiologist and device physician.   3 month ICM trend: 12/24/2016   1 Year ICM trend:      Rosalene Billings, RN 12/24/2016 8:46 AM

## 2017-01-14 ENCOUNTER — Telehealth: Payer: Self-pay | Admitting: Internal Medicine

## 2017-01-14 NOTE — Telephone Encounter (Signed)
Pt calling would like [place] to mail her a copy of the test results  She would like a copy for herself  Please advise.

## 2017-01-14 NOTE — Telephone Encounter (Signed)
Called pt and she ask that a copy of PFT results be mailed to her. Results mailed. Nothing further needed.

## 2017-01-24 ENCOUNTER — Ambulatory Visit (INDEPENDENT_AMBULATORY_CARE_PROVIDER_SITE_OTHER): Payer: Medicare Other | Admitting: *Deleted

## 2017-01-24 DIAGNOSIS — Z950 Presence of cardiac pacemaker: Secondary | ICD-10-CM

## 2017-01-24 DIAGNOSIS — I442 Atrioventricular block, complete: Secondary | ICD-10-CM

## 2017-01-24 DIAGNOSIS — I5032 Chronic diastolic (congestive) heart failure: Secondary | ICD-10-CM | POA: Diagnosis not present

## 2017-01-24 NOTE — Progress Notes (Signed)
EPIC Encounter for ICM Monitoring  Patient Name: Jacqueline Lozano is a 82 y.o. female Date: 01/24/2017 Primary Care Physican: [name], MD Primary Cardiologist:[name] Electrophysiologist: [name] Weight:Does not weigh Bi-V Pacing: 99.4%      Heart Failure questions reviewed, pt asymptomatic.   Thoracic impedance normal.  Prescribed dosage: Furosemide 20 mg 1 tablet twice a day as needed but patient is taking 1 tablet 20 mg daily and taking 2nd tablet only when needed. Klor Con 10 mEq 1 tablet daily  Recommendations: No changes.    Encouraged to call for fluid symptoms.  Follow-up plan: ICM clinic phone appointment on 02/24/2017.  Advised she is due to make October appointment with Dr Caryl Comes.  Copy of ICM check sent to device physician.   3 month ICM trend: 01/24/2017   1 Year ICM trend:      Rosalene Billings, RN 01/24/2017 12:32 PM

## 2017-01-24 NOTE — Progress Notes (Signed)
ICD Remote Transmission  

## 2017-01-26 LAB — CUP PACEART REMOTE DEVICE CHECK
Battery Remaining Longevity: 21 mo
Brady Statistic AP VS Percent: 0 %
Brady Statistic AS VP Percent: 99.1 %
Brady Statistic AS VS Percent: 0.9 %
Brady Statistic RA Percent Paced: 0 %
Brady Statistic RV Percent Paced: 99.37 %
Date Time Interrogation Session: 20180820124315
Implantable Lead Model: 5076
Lead Channel Impedance Value: 4047 Ohm
Lead Channel Impedance Value: 4047 Ohm
Lead Channel Impedance Value: 437 Ohm
Lead Channel Impedance Value: 494 Ohm
Lead Channel Impedance Value: 608 Ohm
Lead Channel Impedance Value: 684 Ohm
Lead Channel Impedance Value: 817 Ohm
Lead Channel Pacing Threshold Amplitude: 0.75 V
Lead Channel Pacing Threshold Amplitude: 1.875 V
Lead Channel Sensing Intrinsic Amplitude: 5.125 mV
Lead Channel Setting Pacing Pulse Width: 0.4 ms
Lead Channel Setting Sensing Sensitivity: 0.9 mV
MDC IDC LEAD IMPLANT DT: 20120106
MDC IDC LEAD IMPLANT DT: 20120106
MDC IDC LEAD LOCATION: 753858
MDC IDC LEAD LOCATION: 753860
MDC IDC MSMT BATTERY VOLTAGE: 2.89 V
MDC IDC MSMT LEADCHNL LV IMPEDANCE VALUE: 1178 Ohm
MDC IDC MSMT LEADCHNL LV IMPEDANCE VALUE: 912 Ohm
MDC IDC MSMT LEADCHNL LV PACING THRESHOLD PULSEWIDTH: 1 ms
MDC IDC MSMT LEADCHNL RV PACING THRESHOLD PULSEWIDTH: 0.4 ms
MDC IDC MSMT LEADCHNL RV SENSING INTR AMPL: 5.125 mV
MDC IDC PG IMPLANT DT: 20120106
MDC IDC SET LEADCHNL RV PACING AMPLITUDE: 2.5 V
MDC IDC STAT BRADY AP VP PERCENT: 0 %

## 2017-01-31 ENCOUNTER — Telehealth: Payer: Self-pay | Admitting: Cardiovascular Disease

## 2017-01-31 NOTE — Telephone Encounter (Signed)
Samples and patient assistance forms are up front for pickup.

## 2017-01-31 NOTE — Telephone Encounter (Signed)
Will place Eliquis 5MG  samples up front for patient to pick up.   Please advise patient states she can not afford medication.    3 boxes of Eliquis 5MG   LOT #: [id number] EXP: 12/20

## 2017-01-31 NOTE — Telephone Encounter (Signed)
Patient calling the office for samples of medication:   1.  What medication and dosage are you requesting samples for? Eliquis   2.  Are you currently out of this medication?  Yes She went to the pharmacy and it went from $37 to $164  She states she can't afford that  Please advise

## 2017-01-31 NOTE — Telephone Encounter (Signed)
Can you provide her w/ patient assistance paperwork w/ her samples to see if she qualifies?

## 2017-02-04 ENCOUNTER — Encounter: Payer: Self-pay | Admitting: Cardiology

## 2017-02-17 ENCOUNTER — Telehealth: Payer: Self-pay

## 2017-02-17 NOTE — Telephone Encounter (Signed)
Returned patient call as requested by voice mail message.  She wanted to confirm next ICM remote transmission 9/20 and office appointment with Dr Caryl Comes 11/13.

## 2017-02-24 ENCOUNTER — Ambulatory Visit (INDEPENDENT_AMBULATORY_CARE_PROVIDER_SITE_OTHER): Payer: Medicare Other

## 2017-02-24 DIAGNOSIS — I5032 Chronic diastolic (congestive) heart failure: Secondary | ICD-10-CM | POA: Diagnosis not present

## 2017-02-24 DIAGNOSIS — Z950 Presence of cardiac pacemaker: Secondary | ICD-10-CM | POA: Diagnosis not present

## 2017-02-25 NOTE — Progress Notes (Signed)
EPIC Encounter for ICM Monitoring  Patient Name: Jacqueline Lozano is a 82 y.o. female Date: 02/25/2017 Primary Care Physican: [name], MD Primary Cardiologist:[name] Electrophysiologist: [name] Weight:Does not weigh Bi-V Pacing: 99.5%       Heart Failure questions reviewed, pt asymptomatic`.   Thoracic impedance normal.  Prescribed dosage: Furosemide 20 mg 1 tablet twice a day as needed but patient is taking 1 tablet 20 mg daily and taking 2nd tablet only when needed. Klor Con 10 mEq 1 tablet daily  Recommendations: No changes.   Encouraged to call for fluid symptoms.  Follow-up plan: ICM clinic phone appointment on 03/28/2017.  Office appointment scheduled 04/19/2017 with Dr. Caryl Comes.  Copy of ICM check sent to Dr. Caryl Comes.   3 month ICM trend: 02/24/2017   1 Year ICM trend:      Rosalene Billings, RN 02/25/2017 11:17 AM

## 2017-03-08 ENCOUNTER — Other Ambulatory Visit: Payer: Self-pay | Admitting: Cardiology

## 2017-03-08 DIAGNOSIS — R0789 Other chest pain: Secondary | ICD-10-CM

## 2017-03-14 ENCOUNTER — Encounter
Admission: RE | Admit: 2017-03-14 | Discharge: 2017-03-14 | Disposition: A | Discharge status: Home / Self Care | Payer: Medicare Other | Source: Ambulatory Visit | Attending: Cardiology | Admitting: Cardiology

## 2017-03-14 DIAGNOSIS — R0789 Other chest pain: Secondary | ICD-10-CM | POA: Insufficient documentation

## 2017-03-14 LAB — NM MYOCAR MULTI W/SPECT W/WALL MOTION / EF
CHL CUP NUCLEAR SSS: 2
CSEPEDS: 3 s
CSEPHR: 58 %
Estimated workload: 1 METS
Exercise duration (min): 1 min
LVDIAVOL: 41 mL (ref 46–106)
LVSYSVOL: 6 mL
MPHR: 140 {beats}/min
Peak HR: 82 {beats}/min
Rest HR: 69 {beats}/min
SDS: 0
SRS: 0
TID: 0.79

## 2017-03-14 MED ORDER — TECHNETIUM TC 99M TETROFOSMIN IV KIT
31.3370 | PACK | Freq: Once | INTRAVENOUS | Status: AC | PRN
  Administered 2017-03-14: 31.337 via INTRAVENOUS

## 2017-03-14 MED ORDER — REGADENOSON 0.4 MG/5ML IV SOLN
0.4000 mg | Freq: Once | INTRAVENOUS | Status: AC
  Administered 2017-03-14: 0.4 mg via INTRAVENOUS

## 2017-03-14 MED ORDER — TECHNETIUM TC 99M TETROFOSMIN IV KIT
13.0000 | PACK | Freq: Once | INTRAVENOUS | Status: AC | PRN
  Administered 2017-03-14: 12.56 via INTRAVENOUS

## 2017-03-28 ENCOUNTER — Ambulatory Visit (INDEPENDENT_AMBULATORY_CARE_PROVIDER_SITE_OTHER): Payer: Medicare Other

## 2017-03-28 DIAGNOSIS — I5032 Chronic diastolic (congestive) heart failure: Secondary | ICD-10-CM | POA: Diagnosis not present

## 2017-03-28 DIAGNOSIS — Z950 Presence of cardiac pacemaker: Secondary | ICD-10-CM | POA: Diagnosis not present

## 2017-03-28 NOTE — Progress Notes (Signed)
EPIC Encounter for ICM Monitoring  Patient Name: Jacqueline Lozano is a 82 y.o. female Date: 03/28/2017 Primary Care Physican: [name], MD Primary Cardiologist:[name] Electrophysiologist: [name] Weight:Does not weigh Bi-V Pacing: 99.5%        Heart Failure questions reviewed, pt asymptomatic.   Thoracic impedance normal.  Prescribed dosage: Furosemide 20 mg 1 tablet twice a day as needed but patient is taking 1 tablet 20 mg daily and taking 2nd tablet only when needed. Klor Con 10 mEq 1 tablet daily  Recommendations: No changes.   Encouraged to call for fluid symptoms.  Follow-up plan: ICM clinic phone appointment on 05/03/2017.  Office appointment scheduled 04/19/2017 with Dr. Caryl Comes.  Copy of ICM check sent to Dr. Caryl Comes.   3 month ICM trend: 03/28/2017   1 Year ICM trend:      Rosalene Billings, RN 03/28/2017 1:09 PM

## 2017-03-31 ENCOUNTER — Other Ambulatory Visit: Payer: Self-pay | Admitting: Cardiovascular Disease

## 2017-03-31 NOTE — Telephone Encounter (Signed)
Pt needs appt for BMET to check serum creatinine to calculate appropriate Eliquis dose

## 2017-04-01 ENCOUNTER — Telehealth: Payer: Self-pay | Admitting: Cardiovascular Disease

## 2017-04-01 NOTE — Telephone Encounter (Signed)
Spoke with patient and states that when she went to pick up medication that they advised her to have labs done. I do not see a note in her chart at this time. She wanted to see if she could wait until she comes in to see Dr. Caryl Comes in November. Reviewed with her that should be fine and confirmed her upcoming appointment information. She was appreciative for the call back and has no further questions at this time.

## 2017-04-01 NOTE — Telephone Encounter (Signed)
Pt is calling, states she needs labwork done. Please call and advise if this is correct.

## 2017-04-13 ENCOUNTER — Encounter: Payer: Self-pay | Admitting: Internal Medicine

## 2017-04-13 ENCOUNTER — Ambulatory Visit (INDEPENDENT_AMBULATORY_CARE_PROVIDER_SITE_OTHER): Payer: Medicare Other | Admitting: Internal Medicine

## 2017-04-13 VITALS — BP 158/76 | HR 73 | Ht 62.0 in | Wt 221.5 lb

## 2017-04-13 DIAGNOSIS — I5032 Chronic diastolic (congestive) heart failure: Secondary | ICD-10-CM | POA: Diagnosis not present

## 2017-04-13 DIAGNOSIS — Z950 Presence of cardiac pacemaker: Secondary | ICD-10-CM | POA: Diagnosis not present

## 2017-04-13 DIAGNOSIS — I442 Atrioventricular block, complete: Secondary | ICD-10-CM | POA: Diagnosis not present

## 2017-04-13 DIAGNOSIS — I350 Nonrheumatic aortic (valve) stenosis: Secondary | ICD-10-CM

## 2017-04-13 NOTE — Progress Notes (Signed)
Dictation #1 [id number]  [id number]      Patient Care Team: [name], MD as PCP - General (Family Medicine) Minna Merritts, MD as Consulting Physician (Cardiology)   HPI  Jacqueline Lozano is a 82 y.o. female seen in followup for pacemaker implantation for tachybradycardia syndrome. She has a history of permanent atrial fibrillation and status post CRT P. she is status post AV junction ablation  LV lead has been permanently turned off because of diaphragmatic stimulation  She has chronic shortness of breath.  This has been looked at considerably. Dr. Dollene Cleveland notes from 10/18 were reviewed. Old chart review demonstrates her last evaluation with pulmonary was 11/12 and she was in pulmonary rehabilitation 2014       She also had significant hypertension and amlodipine was added. Blood pressure is reasonably controlled.   DATE TEST    2/16    Echo   EF 65 % Mild AS Grad 11  10/18    Myoview   EF 70 % No ischemia         Date Cr Hgb  4/18 1.4 12.2        She went to see Dr. Ubaldo Glassing for second opinion.  She also saw pulmonary.  She had standard PFTs.  Not   Struggles with ongoing dyspnea.  She is unable to exercise because of her knees.  Is not interested in getting her knees replaced as she is still ambulatory.    Past Medical History:  Diagnosis Date  . Abnormal echocardiogram 09/2009   EF >55%, mild LVH, moderate LAE, normal RV, normal PA systolic pressure  . Allergic to IV contrast   . Chronic diastolic CHF (congestive heart failure) ([place])   . Complete heart block-S./P. AV junction ablation 11/23/2010  . GERD (gastroesophageal reflux disease)   . Hyperlipidemia    Has been unable to take statins due to muscle pain. Has tried multiple statins per her report.  . Hypertension   . Hypothyroidism   . Migraine   . OSA (obstructive sleep apnea)    Not consistently using CPAP  . Pacemaker-Medtronic CRT P. 11/23/2010   Atrial port was plugged   . PAD (peripheral  artery disease) (HCC)    Occlusive disease involving left PT and AT  . Paroxysmal atrial fibrillation (HCC)    Breakthrough a fib on sotalol, changed to amiodarone 4/11. DCCV 4/11    Past Surgical History:  Procedure Laterality Date  . APPENDECTOMY    . ATRIAL ABLATION SURGERY  06/12/2010  . CARDIAC CATHETERIZATION     Left heart cath x3 in teh past, last 5 yrs ago. All "normal" per pt report  . CARDIOVERSION    . CARPAL TUNNEL RELEASE    . CHOLECYSTECTOMY OPEN    . PACEMAKER INSERTION  06/12/2010  . VESICOVAGINAL FISTULA CLOSURE W/ TAH      Current Outpatient Medications  Medication Sig Dispense Refill  . ELIQUIS 5 MG TABS tablet TAKE 1 TABLET BY MOUTH TWICE DAILY 60 tablet 0  . famotidine (PEPCID) 10 MG tablet Take 10 mg by mouth daily as needed. Reported on 08/19/2015    . furosemide (LASIX) 20 MG tablet TAKE ONE TABLET BY MOUTH TWICE DAILY AS NEEDED 180 tablet 3  . GuaiFENesin (MUCINEX PO) Take by mouth as needed.    Marland Kitchen KLOR-CON M10 10 MEQ tablet TAKE ONE TABLET BY MOUTH ONCE DAILY 90 tablet 3  . levothyroxine (SYNTHROID, LEVOTHROID) 88 MCG tablet Take 88 mcg by mouth daily.    Marland Kitchen  meclizine (ANTIVERT) 25 MG tablet Take 1 tablet (25 mg total) by mouth 3 (three) times daily as needed. 90 tablet 1  . metoprolol (LOPRESSOR) 50 MG tablet TAKE ONE & ONE-HALF TABLETS BY MOUTH TWICE DAILY 270 tablet 3  . nitroGLYCERIN (NITROSTAT) 0.4 MG SL tablet Place 1 tablet (0.4 mg total) under the tongue every 5 (five) minutes as needed for chest pain. 25 tablet 3  . prochlorperazine (COMPAZINE) 5 MG tablet Take 1 tablet (5 mg total) by mouth every 6 (six) hours as needed. 90 tablet 5  . VITAMIN B1-B12 IM Inject into the muscle every 30 (thirty) days.     No current facility-administered medications for this visit.     Allergies  Allergen Reactions  . Ivp Dye [Iodinated Diagnostic Agents] Shortness Of Breath  . Other Shortness Of Breath, Nausea And Vomiting and Other (See Comments)    Contrast  dye  . Codeine Other (See Comments)  . Codeine Sulfate Other (See Comments)  . Dipyridamole Other (See Comments)  . Iodine Other (See Comments)  . Morphine Other (See Comments)  . Oxycodone-Acetaminophen   . Oxycodone-Acetaminophen Other (See Comments)  . Shellfish-Derived Products     Other reaction(s): Unknown  . Statins Other (See Comments)    Other reaction(s): Unknown  . Sulfa Antibiotics Other (See Comments)    Other reaction(s): Unknown  . Sulfonamide Derivatives   . Amlodipine Rash and Other (See Comments)    Review of Systems negative except from HPI and PMH  Physical Exam BP (!) 158/76 (BP Location: Left Arm, Patient Position: Sitting, Cuff Size: Large)   Pulse 73   Ht 5\' 2"  (1.575 m)   Wt 221 lb 8 oz (100.5 kg)   SpO2 98%   BMI 40.51 kg/m  Well developed and Morbidly obese  in no acute distress HENT normal Neck supple with JVP-flat Carotids brisk and full without bruits Clear Regular rate and rhythm, 2/6 m Abd-soft with active BS without hepatomegaly No Clubbing cyanosis edema Skin-warm and dry A & Oriented  Grossly normal sensory and motor function   ECG was ordered today demonstrated atrial fibrillation and ventriclar pacing 1 Assessment and  Plan  HFpEF  Atrial fibrillation-permanent  AV junction ablation-complete heart block  Pacemaker-CRT  The patient's device was interrogated.  The information was reviewed. Changes were made as noted below Aortic stenosis-mild  Hypertension  Blood pressure remains mildly elevated.  We will ask her to follow-up with her PCP consider augmented antihypertensive therapy  Euvolemic continue current meds  Adjusted that if she wants a further input regarding her dyspnea, that her heart failure consultation appointment might be of value  .On Anticoagulation;  No bleeding issues   Recommendations would be for repeat ultrasound next year or the year after for her aortic stenosis.  Gradient delta over the 2 years  preceding was 4---11     We spent more than 50% of our >25 min visit in face to face counseling regarding the above

## 2017-04-13 NOTE — Patient Instructions (Signed)
Medication Instructions:  Your physician recommends that you continue on your current medications as directed. Please refer to the Current Medication list given to you today.   Labwork: BMET and CBC today  Testing/Procedures: none  Follow-Up: Your physician wants you to follow-up in: 1 year with Dr. Caryl Comes.  You will receive a reminder letter in the mail two months in advance. If you don't receive a letter, please call our office to schedule the follow-up appointment.   Any Other Special Instructions Will Be Listed Below (If Applicable).     If you need a refill on your cardiac medications before your next appointment, please call your pharmacy.

## 2017-04-14 LAB — CBC
HEMOGLOBIN: 13.3 g/dL (ref 11.1–15.9)
Hematocrit: 38.7 % (ref 34.0–46.6)
MCH: 30 pg (ref 26.6–33.0)
MCHC: 34.4 g/dL (ref 31.5–35.7)
MCV: 87 fL (ref 79–97)
Platelets: 276 10*3/uL (ref 150–379)
RBC: 4.44 x10E6/uL (ref 3.77–5.28)
RDW: 14.3 % (ref 12.3–15.4)
WBC: 12.1 10*3/uL — ABNORMAL HIGH (ref 3.4–10.8)

## 2017-04-14 LAB — BASIC METABOLIC PANEL
BUN/Creatinine Ratio: 16 (ref 12–28)
BUN: 26 mg/dL (ref 8–27)
CO2: 18 mmol/L — ABNORMAL LOW (ref 20–29)
Calcium: 9.6 mg/dL (ref 8.7–10.3)
Chloride: 106 mmol/L (ref 96–106)
Creatinine, Ser: 1.6 mg/dL — ABNORMAL HIGH (ref 0.57–1.00)
GFR, EST AFRICAN AMERICAN: 35 mL/min/{1.73_m2} — AB (ref >59)
GFR, EST NON AFRICAN AMERICAN: 30 mL/min/{1.73_m2} — AB (ref >59)
Glucose: 87 mg/dL (ref 65–99)
POTASSIUM: 4.6 mmol/L (ref 3.5–5.2)
SODIUM: 141 mmol/L (ref 134–144)

## 2017-04-18 ENCOUNTER — Telehealth: Payer: Self-pay | Admitting: Internal Medicine

## 2017-04-18 NOTE — Telephone Encounter (Signed)
[name] is returning a call . Thanks

## 2017-04-18 NOTE — Telephone Encounter (Signed)
-----   Message from Deboraha Sprang, MD sent at 04/17/2017  5:20 PM EST ----- Please Inform Patient that labs are normal x worsening renal function We should have her followup with her PCP about this   Thanks

## 2017-04-18 NOTE — Telephone Encounter (Signed)
Pt is aware and agreeable to results. She informed me that she is actually taking her Lasix 20 mg qd and if she has some extra swelling she takes and additional 20 mg. According to the Med list at the [facility] she is only taking lasix 20 mg prn. I will forward lab results to PCP for advising. She is agreeable.

## 2017-04-19 ENCOUNTER — Other Ambulatory Visit: Payer: Self-pay | Admitting: Internal Medicine

## 2017-04-19 ENCOUNTER — Encounter: Payer: Medicare Other | Admitting: Internal Medicine

## 2017-04-26 ENCOUNTER — Other Ambulatory Visit: Payer: Self-pay | Admitting: Cardiovascular Disease

## 2017-04-26 ENCOUNTER — Telehealth: Payer: Self-pay | Admitting: Internal Medicine

## 2017-04-26 NOTE — Telephone Encounter (Signed)
Please review for refill, thanks ! 

## 2017-04-26 NOTE — Telephone Encounter (Signed)
Please review for refill, Thanks !  

## 2017-04-26 NOTE — Telephone Encounter (Signed)
Pt is requesting Eliquis refill  Jacqueline Lozano

## 2017-04-26 NOTE — Telephone Encounter (Signed)
New message    Patient calling with concerns about lab results from 11/12.  She is requesting a call back to let her know what medication is recommended for her kidneys. Please call

## 2017-04-26 NOTE — Telephone Encounter (Signed)
Refill Request.  

## 2017-04-26 NOTE — Telephone Encounter (Signed)
S/w patient. She states she thinks she needs to be seen for her lab work and to have it rechecked. She states she's had some burning with urination and occasional back pain. Advised patient she would need to call her PCP to be evaluated sometime in the near future if possible this week. We discussed results from latest lab work on 04/13/17. She verbalized understanding to call PCP for follow up.

## 2017-05-03 ENCOUNTER — Ambulatory Visit (INDEPENDENT_AMBULATORY_CARE_PROVIDER_SITE_OTHER): Payer: Medicare Other | Admitting: *Deleted

## 2017-05-03 DIAGNOSIS — I442 Atrioventricular block, complete: Secondary | ICD-10-CM

## 2017-05-03 DIAGNOSIS — Z950 Presence of cardiac pacemaker: Secondary | ICD-10-CM

## 2017-05-03 DIAGNOSIS — I5032 Chronic diastolic (congestive) heart failure: Secondary | ICD-10-CM

## 2017-05-04 NOTE — Progress Notes (Signed)
Remote ICD transmission.   

## 2017-05-05 LAB — CUP PACEART REMOTE DEVICE CHECK
Battery Remaining Longevity: 17 mo
Battery Voltage: 2.88 V
Brady Statistic AS VP Percent: 97.57 %
Brady Statistic AS VS Percent: 2.43 %
Date Time Interrogation Session: 20181127133939
Implantable Lead Implant Date: 20120106
Implantable Lead Location: 753858
Implantable Lead Location: 753860
Implantable Pulse Generator Implant Date: 20120106
Lead Channel Impedance Value: 437 Ohm
Lead Channel Impedance Value: 494 Ohm
Lead Channel Impedance Value: 703 Ohm
Lead Channel Pacing Threshold Amplitude: 0.75 V
Lead Channel Pacing Threshold Amplitude: 1.875 V
Lead Channel Pacing Threshold Pulse Width: 1 ms
Lead Channel Sensing Intrinsic Amplitude: 5.125 mV
Lead Channel Sensing Intrinsic Amplitude: 5.125 mV
Lead Channel Setting Pacing Pulse Width: 0.4 ms
Lead Channel Setting Sensing Sensitivity: 0.9 mV
MDC IDC LEAD IMPLANT DT: 20120106
MDC IDC MSMT LEADCHNL LV IMPEDANCE VALUE: 1045 Ohm
MDC IDC MSMT LEADCHNL LV IMPEDANCE VALUE: 589 Ohm
MDC IDC MSMT LEADCHNL LV IMPEDANCE VALUE: 684 Ohm
MDC IDC MSMT LEADCHNL LV IMPEDANCE VALUE: 798 Ohm
MDC IDC MSMT LEADCHNL RA IMPEDANCE VALUE: 4047 Ohm
MDC IDC MSMT LEADCHNL RA IMPEDANCE VALUE: 4047 Ohm
MDC IDC MSMT LEADCHNL RV PACING THRESHOLD PULSEWIDTH: 0.4 ms
MDC IDC SET LEADCHNL RV PACING AMPLITUDE: 2.5 V
MDC IDC STAT BRADY AP VP PERCENT: 0 %
MDC IDC STAT BRADY AP VS PERCENT: 0 %
MDC IDC STAT BRADY RA PERCENT PACED: 0 %
MDC IDC STAT BRADY RV PERCENT PACED: 98.15 %

## 2017-05-05 NOTE — Progress Notes (Signed)
EPIC Encounter for ICM Monitoring  Patient Name: Jacqueline Lozano is a 82 y.o. female Date: 05/05/2017 Primary Care Physican: [name], MD Primary Cardiologist:[name] Electrophysiologist: [name] Weight:Does not weigh Bi-V Pacing: 98.1%        Heart Failure questions reviewed, pt asymptomatic.  She is currently treated for UTI and drinking extra fluids   Thoracic impedance slightly below baseline.  Prescribed dosage: Furosemide 20 mg 1 tablet twice a day as needed but patient is taking 1 tablet 20 mg daily and taking 2nd tablet only when needed. Klor Con 10 mEq 1 tablet daily  Recommendations: No changes.  Encouraged to call for fluid symptoms.  Follow-up plan: ICM clinic phone appointment on 06/09/2017.   Copy of ICM check sent to Dr. Caryl Comes.   3 month ICM trend: 05/03/2017    1 Year ICM trend:       Rosalene Billings, RN 05/05/2017 2:51 PM

## 2017-05-06 ENCOUNTER — Encounter: Payer: Self-pay | Admitting: Cardiology

## 2017-05-10 LAB — CUP PACEART INCLINIC DEVICE CHECK
Battery Remaining Longevity: 17 mo
Brady Statistic AP VP Percent: 0 %
Brady Statistic AP VS Percent: 0 %
Brady Statistic AS VP Percent: 98.79 %
Brady Statistic RV Percent Paced: 99.05 %
Date Time Interrogation Session: 20181107192650
Implantable Lead Implant Date: 20120106
Implantable Lead Location: 753860
Implantable Lead Model: 5076
Lead Channel Impedance Value: 4047 Ohm
Lead Channel Impedance Value: 437 Ohm
Lead Channel Impedance Value: 570 Ohm
Lead Channel Impedance Value: 684 Ohm
Lead Channel Impedance Value: 703 Ohm
Lead Channel Impedance Value: 798 Ohm
Lead Channel Setting Pacing Pulse Width: 0.4 ms
MDC IDC LEAD IMPLANT DT: 20120106
MDC IDC LEAD LOCATION: 753858
MDC IDC MSMT BATTERY VOLTAGE: 2.88 V
MDC IDC MSMT LEADCHNL LV IMPEDANCE VALUE: 1045 Ohm
MDC IDC MSMT LEADCHNL LV PACING THRESHOLD AMPLITUDE: 1.875 V
MDC IDC MSMT LEADCHNL LV PACING THRESHOLD PULSEWIDTH: 1 ms
MDC IDC MSMT LEADCHNL RA IMPEDANCE VALUE: 4047 Ohm
MDC IDC MSMT LEADCHNL RV IMPEDANCE VALUE: 494 Ohm
MDC IDC MSMT LEADCHNL RV PACING THRESHOLD AMPLITUDE: 1 V
MDC IDC MSMT LEADCHNL RV PACING THRESHOLD PULSEWIDTH: 0.4 ms
MDC IDC PG IMPLANT DT: 20120106
MDC IDC SET LEADCHNL RV PACING AMPLITUDE: 2.5 V
MDC IDC SET LEADCHNL RV SENSING SENSITIVITY: 0.9 mV
MDC IDC STAT BRADY AS VS PERCENT: 1.21 %
MDC IDC STAT BRADY RA PERCENT PACED: 0 %

## 2017-06-08 ENCOUNTER — Other Ambulatory Visit: Payer: Self-pay | Admitting: Cardiovascular Disease

## 2017-06-08 ENCOUNTER — Other Ambulatory Visit: Payer: Self-pay | Admitting: Internal Medicine

## 2017-06-08 NOTE — Telephone Encounter (Signed)
Pt requesting refill on Eliquis 5 mg BID. Pt is 83 y/o female, wt 100.5 kg, serum creatinine 1.6, which gives her creatinine clearance of 44.49. She has been c/o urinary frequency and back pain and has been advised to f/u w/ PCP. Due to sx and worsening kidney function, would you consider decreasing her Eliquis to 2.5 mg BID? Please advise and I will send in your recommendation. Thank you!

## 2017-06-09 ENCOUNTER — Ambulatory Visit (INDEPENDENT_AMBULATORY_CARE_PROVIDER_SITE_OTHER): Payer: Medicare Other

## 2017-06-09 ENCOUNTER — Telehealth: Payer: Self-pay

## 2017-06-09 DIAGNOSIS — Z950 Presence of cardiac pacemaker: Secondary | ICD-10-CM | POA: Diagnosis not present

## 2017-06-09 DIAGNOSIS — I5032 Chronic diastolic (congestive) heart failure: Secondary | ICD-10-CM

## 2017-06-09 MED ORDER — APIXABAN 5 MG PO TABS: 5.0000 mg | ORAL_TABLET | Freq: Two times a day (BID) | ORAL | 0 refills | Status: DC

## 2017-06-09 NOTE — Progress Notes (Signed)
EPIC Encounter for ICM Monitoring  Patient Name: Jacqueline Lozano is a 83 y.o. female Date: 06/09/2017 Primary Care Physican: [name], MD Primary Cardiologist:[name] Electrophysiologist: [name] Weight:Does not weigh Bi-V Pacing: 98.5%      Attempted call to patient and unable to reach.  Left detailed message regarding transmission.  Transmission reviewed.    Thoracic impedance normal.  Prescribed dosage: Furosemide 20 mg 1 tablet twice a day as needed but patient is taking 1 tablet 20 mg daily and taking 2nd tablet only when needed. Klor Con 10 mEq 1 tablet daily  Recommendations: Left voice mail with ICM number and encouraged to call if experiencing any fluid symptoms.  Follow-up plan: ICM clinic phone appointment on 07/11/2017.  Office appointment scheduled 07/04/2017 with Dr. Rockey Situ.  Copy of ICM check sent to Dr. Caryl Comes.   3 month ICM trend: 06/09/2017    1 Year ICM trend:       Rosalene Billings, RN 06/09/2017 4:46 PM

## 2017-06-09 NOTE — Telephone Encounter (Signed)
Refill Request.  

## 2017-06-09 NOTE — Telephone Encounter (Signed)
Pt called office and is currently out of medication, Please refill.

## 2017-06-09 NOTE — Telephone Encounter (Signed)
Pt last saw Dr Caryl Comes 04/13/17, last labs 04/13/17 Creat 1.6, age 83, weight 100.5kg, based on specified criteria pt should be taking 2.5mg  BID not 5mg  BID.  However discussed with Eino Farber PharmD her last Creat 1.6 on 04/13/17 seemed to be out of the norm for pt, historically she had been running in the 1.2's.  Before we change her dosage of Eliquis we would like to repeat CBC and BMP.  Pt has recall appt for 6 month f/u with Dr Rockey Situ.  [place] office requested Caryl Pina make pt follow-up appt and we will place a note on appt to draw CBC and BMP at that time.  If unable to get appt with Dr Rockey Situ to follow-up soon, will need CBC and BMP drawn to calculate appropriate dosage of Eliquis.  Will refill Eliquis rx x 1 month today since pt is currently out of medication.  Will forward rx refill for [place], RN in [place] to follow-up on.

## 2017-06-09 NOTE — Telephone Encounter (Signed)
Remote ICM transmission received.  Attempted call to patient and left detailed message per DPR regarding transmission and next ICM scheduled for 07/11/2017.  Advised to return call for any fluid symptoms or questions.    

## 2017-06-11 NOTE — Telephone Encounter (Signed)
Needs repeat bmp, CR 1.6 was well above her baseline

## 2017-06-13 NOTE — Telephone Encounter (Signed)
Please see previous note. Pt scheduled for labs at next ov w/ Dr. Rockey Situ 07/04/17.

## 2017-06-14 ENCOUNTER — Other Ambulatory Visit
Admission: RE | Admit: 2017-06-14 | Discharge: 2017-06-14 | Disposition: A | Discharge status: Home / Self Care | Payer: Medicare Other | Source: Ambulatory Visit | Attending: Cardiovascular Disease | Admitting: Cardiovascular Disease

## 2017-06-14 ENCOUNTER — Telehealth: Payer: Self-pay | Admitting: *Deleted

## 2017-06-14 DIAGNOSIS — I480 Paroxysmal atrial fibrillation: Secondary | ICD-10-CM

## 2017-06-14 DIAGNOSIS — I5032 Chronic diastolic (congestive) heart failure: Secondary | ICD-10-CM | POA: Insufficient documentation

## 2017-06-14 DIAGNOSIS — Z7901 Long term (current) use of anticoagulants: Secondary | ICD-10-CM

## 2017-06-14 LAB — CBC WITH DIFFERENTIAL/PLATELET
BASOS PCT: 1 %
Basophils Absolute: 0.1 10*3/uL (ref 0–0.1)
Eosinophils Absolute: 0.4 10*3/uL (ref 0–0.7)
Eosinophils Relative: 4 %
HEMATOCRIT: 38.4 % (ref 35.0–47.0)
HEMOGLOBIN: 12.8 g/dL (ref 12.0–16.0)
LYMPHS ABS: 2.9 10*3/uL (ref 1.0–3.6)
Lymphocytes Relative: 32 %
MCH: 30.1 pg (ref 26.0–34.0)
MCHC: 33.4 g/dL (ref 32.0–36.0)
MCV: 90.4 fL (ref 80.0–100.0)
Monocytes Absolute: 1.1 10*3/uL — ABNORMAL HIGH (ref 0.2–0.9)
Monocytes Relative: 12 %
NEUTROS ABS: 4.9 10*3/uL (ref 1.4–6.5)
NEUTROS PCT: 51 %
Platelets: 248 10*3/uL (ref 150–440)
RBC: 4.24 MIL/uL (ref 3.80–5.20)
RDW: 14.8 % — ABNORMAL HIGH (ref 11.5–14.5)
WBC: 9.3 10*3/uL (ref 3.6–11.0)

## 2017-06-14 LAB — BASIC METABOLIC PANEL
ANION GAP: 8 (ref 5–15)
BUN: 25 mg/dL — ABNORMAL HIGH (ref 6–20)
CALCIUM: 9.3 mg/dL (ref 8.9–10.3)
CHLORIDE: 106 mmol/L (ref 101–111)
CO2: 22 mmol/L (ref 22–32)
Creatinine, Ser: 1.36 mg/dL — ABNORMAL HIGH (ref 0.44–1.00)
GFR calc non Af Amer: 36 mL/min — ABNORMAL LOW (ref >60)
GFR, EST AFRICAN AMERICAN: 41 mL/min — AB (ref >60)
Glucose, Bld: 101 mg/dL — ABNORMAL HIGH (ref 65–99)
POTASSIUM: 4.4 mmol/L (ref 3.5–5.1)
Sodium: 136 mmol/L (ref 135–145)

## 2017-06-14 NOTE — Telephone Encounter (Signed)
Dr. Rockey Situ requested that I call patient to have CBC and BMP prior to upcoming appointment. Spoke with patient and reviewed his recommendations and she was agreeable to have these done. Reviewed that she could go to [facility] and check in at the front desk to have those done when possible. She confirmed information with no further questions at this time.

## 2017-06-21 MED ORDER — APIXABAN 5 MG PO TABS: 5.0000 mg | ORAL_TABLET | Freq: Two times a day (BID) | ORAL | 1 refills | Status: DC

## 2017-06-21 NOTE — Telephone Encounter (Signed)
Pt had lab work repeated 06/14/17 Creat 1.36, this is improved from lab work 2 months ago, and is more consistent with pt's baseline Creat.  Age 83, weight 100.5kg, based on specified criteria pt is on appropriate dosage of Eliquis 5mg  BID.  Will refill rx x 6 months, will need repeat lab work in 6 months, to ensure Creat stability.   Will send to [facility], RN in [place] to ensure lab work get repeated in 6 months and Eliquis dosage is reevaluated appropriately.

## 2017-07-02 NOTE — Progress Notes (Signed)
Cardiology Office Note  Date:  07/04/2017   ID:  Jacqueline Lozano, DOB [birth year], MRN [id number]  PCP:  Jacqueline Pink, MD   Chief Complaint  Patient presents with  . other    6 month follow up.     HPI:  Jacqueline Lozano is a pleasant 83 -year-old woman with  morbid obesity, deconditioning Chronic bilateral knee pain who has declined joint surgery Chronic shortness of breath atrial fibrillation,  status post arteriovenous node ablation, Pacer/CRT-P,  Hypertension,  Paroxysmal nocturnal dyspnea,  Hypothyroidism,  Diastolic congestive heart failure,  C. difficile infection,  Previous pacemaker interrogation shows that she is  ventricularly paced Followed by Dr. Caryl Lozano Echo 07/2014: mild to mo AS who presents for followup of her chronic shortness of breath.  Seen by Dr. Ubaldo Lozano Stress test 03/2017: normal study  Seen by Dr. Caryl Lozano November 2018 No pacemaker changes made  Inactive, sedentary,  uses a scooter and wheelchair Chronic shortness of breath Walker in the house, gives out  Previously seen by pulmonary PFT's shows restrictive lung disease likely from her obseity  Unable to participate in pulmonary rehabilitation given severe chronic knee pain We have recommended water aerobics, she has not participated  Previously on symbicort, unclear if this is making a difference, living off samples in the past  Stopped, expensive  Typically on device check , optivol is not particularly elevated  Denies any leg swelling, no PND or orthopnea.  Feels fine at rest,  symptoms on minimal exertion.  EKG personally reviewed by myself on todays visit Shows paced rhythm 71 bpm  Other past medical history reviewed  Previous echocardiogram 2016 showed mild to moderate aortic valve stenosis, normal EF, normal right heart pressures  Last stress test 2014  Weight is stable On previous visit,e did walk in the office, rate response was appropriate, hit 98 bpm walking down the hall, no  drop in oxygen saturation, maintained for the 95%. She was very short of breath, had difficulty walking back to the room, had to walk very slowly, take breaks  Previous Echo 07/2014: Reviewed with her EF 60%, normal RVSP, mild to moderate AS  Typically takes Lasix 20 mg daily, sometimes 20 mg twice a day  unable to participate in pulmonary rehabilitation secondary to chronic knee pain, osteoarthritis. Weight and conditioning is a major issue.  echocardiogram shows normal LV function, stable valve disease, normal right ventricular systolic pressures.  Previous visit to the emergency room for atypical chest pain. Cardiac enzymes were negative and she was discharged home Prior workup with echocardiogram in 2014 and stress test were unrevealing.   sleep study in October and again in November. Sleep study in October suggested she repeat the test as it was incomplete. The note indicates the study was discontinued as the patient had heart block, with limited EKG monitoring appearing to demonstrate a 43 second period of complete heart block with ventricular standstill. Atrial rate was 80 beats per minute. Note indicates the patient was awake and alert during the event with some dizziness. Pacer was checked after this event,  Normal functioning device.  Followup sleep study 04/24/2013 recommended weight loss, though there was no indications for CPAP at this time. Previous stress test  early 2014 showed no ischemia  PMH:   has a past medical history of Abnormal echocardiogram (09/2009), Allergic to IV contrast, Chronic diastolic CHF (congestive heart failure) ([place]), Complete heart block-S./P. AV junction ablation (11/23/2010), GERD (gastroesophageal reflux disease), Hyperlipidemia, Hypertension, Hypothyroidism, Migraine, OSA (obstructive sleep apnea), Pacemaker-Medtronic  CRT P. (11/23/2010), PAD (peripheral artery disease) ([place]), and Paroxysmal atrial fibrillation ([place]).  PSH:    Past Surgical History:   Procedure Laterality Date  . APPENDECTOMY    . ATRIAL ABLATION SURGERY  06/12/2010  . CARDIAC CATHETERIZATION     Left heart cath x3 in teh past, last 5 yrs ago. All "normal" per pt report  . CARDIOVERSION    . CARPAL TUNNEL RELEASE    . CHOLECYSTECTOMY OPEN    . COLONOSCOPY WITH PROPOFOL N/A 09/08/2015   Procedure: COLONOSCOPY WITH PROPOFOL;  Surgeon: Jacqueline Class, MD;  Location: [facility] ENDOSCOPY;  Service: Endoscopy;  Laterality: N/A;  . ESOPHAGOGASTRODUODENOSCOPY (EGD) WITH PROPOFOL N/A 09/08/2015   Procedure: ESOPHAGOGASTRODUODENOSCOPY (EGD) WITH PROPOFOL;  Surgeon: Jacqueline Class, MD;  Location: [facility] ENDOSCOPY;  Service: Endoscopy;  Laterality: N/A;  . PACEMAKER INSERTION  06/12/2010  . VESICOVAGINAL FISTULA CLOSURE W/ TAH      Current Outpatient Medications  Medication Sig Dispense Refill  . apixaban (ELIQUIS) 5 MG TABS tablet Take 1 tablet (5 mg total) by mouth 2 (two) times daily. 180 tablet 3  . famotidine (PEPCID) 10 MG tablet Take 10 mg by mouth daily as needed. Reported on 08/19/2015    . furosemide (LASIX) 20 MG tablet TAKE ONE TABLET BY MOUTH TWICE DAILY AS NEEDED 180 tablet 3  . GuaiFENesin (MUCINEX PO) Take by mouth as needed.    Marland Kitchen KLOR-CON M10 10 MEQ tablet TAKE ONE TABLET BY MOUTH ONCE DAILY 90 tablet 3  . levothyroxine (SYNTHROID, LEVOTHROID) 88 MCG tablet Take 88 mcg by mouth daily.    . meclizine (ANTIVERT) 25 MG tablet Take 1 tablet (25 mg total) by mouth 3 (three) times daily as needed. 90 tablet 1  . metoprolol (LOPRESSOR) 50 MG tablet TAKE ONE & ONE-HALF TABLETS BY MOUTH TWICE DAILY 270 tablet 3  . nitroGLYCERIN (NITROSTAT) 0.4 MG SL tablet Place 1 tablet (0.4 mg total) under the tongue every 5 (five) minutes as needed for chest pain. 25 tablet 3  . prochlorperazine (COMPAZINE) 5 MG tablet Take 1 tablet (5 mg total) by mouth every 6 (six) hours as needed. 90 tablet 5  . VITAMIN B1-B12 IM Inject into the muscle every 30 (thirty) days.     No current  facility-administered medications for this visit.      Allergies:   Ivp dye [iodinated diagnostic agents]; Other; Codeine; Codeine sulfate; Dipyridamole; Iodine; Morphine; Oxycodone-acetaminophen; Oxycodone-acetaminophen; Shellfish-derived products; Statins; Sulfa antibiotics; Sulfonamide derivatives; and Amlodipine   Social History:  The patient  reports that  has never smoked. she has never used smokeless tobacco. She reports that she does not drink alcohol or use drugs.   Family History:   family history includes Colon cancer in her daughter; Heart attack in her father and mother.    Review of Systems: Review of Systems  Constitutional: Negative.   Respiratory: Positive for shortness of breath.   Cardiovascular: Negative.   Gastrointestinal: Negative.   Musculoskeletal: Negative.        Deconditioned  Neurological: Negative.   Psychiatric/Behavioral: Negative.   All other systems reviewed and are negative.    PHYSICAL EXAM: VS:  BP (!) 149/85   Pulse 71   Ht 5\' 2"  (1.575 m)   Wt 222 lb (100.7 kg)   BMI 40.60 kg/m  , BMI Body mass index is 40.6 kg/m. GEN: Well nourished, well developed, in no acute distress, obese  HEENT: normal  Neck: no JVD, carotid bruits, or masses Cardiac: RRR; no murmurs,  rubs, or gallops,no edema  Respiratory:  clear to auscultation bilaterally, normal work of breathing GI: soft, nontender, nondistended, + BS MS: no deformity or atrophy  Skin: warm and dry, no rash Neuro:  Strength and sensation are intact Psych: euthymic mood, full affect    Recent Labs: 06/14/2017: BUN 25; Creatinine, Ser 1.36; Hemoglobin 12.8; Platelets 248; Potassium 4.4; Sodium 136    Lipid Panel Lab Results  Component Value Date   CHOL 294 (H) 09/30/2009   HDL 44 09/30/2009   LDLCALC See Comment mg/dL 09/30/2009   TRIG 459 (H) 09/30/2009      Wt Readings from Last 3 Encounters:  07/04/17 222 lb (100.7 kg)  04/13/17 221 lb 8 oz (100.5 kg)  12/21/16 218 lb 8  oz (99.1 kg)       ASSESSMENT AND PLAN:  Atrial fibrillation, unspecified type ([place]) - Plan: EKG 12-Lead On anticoagulation, Ventricularly paced Managed by Dr. Caryl Lozano  Shortness of breath Very short of breath with minimal exertion walking down the hallway PFTs with restrictive lung disease from obesity Unable to exercise or lose weight effectively given chronic knee pain Recommended calorie restriction for weight loss Echocardiogram ordered to evaluate aortic valve Previous workup for shortness of breath unrevealing  Pacemaker-Medtronic CRT P. Managed by Dr. Caryl Lozano.   Chronic diastolic CHF (congestive heart failure) (HCC) Recommended she continue on 20 mg daily, 40 mg for any ankle swelling Relatively euvolemic on today's visit  Complete heart block-S./P. AV junction ablation As pacemaker   Total encounter time more than 25 minutes  Greater than 50% was spent in counseling and coordination of care with the patient  Disposition:   F/U  12 months   Orders Placed This Encounter  Procedures  . EKG 12-Lead     Signed, Esmond Plants, M.D., Ph.D. 07/04/2017  [place], [place]

## 2017-07-04 ENCOUNTER — Encounter: Payer: Self-pay | Admitting: Cardiovascular Disease

## 2017-07-04 ENCOUNTER — Ambulatory Visit (INDEPENDENT_AMBULATORY_CARE_PROVIDER_SITE_OTHER): Payer: Medicare Other | Admitting: Cardiovascular Disease

## 2017-07-04 VITALS — BP 140/85 | HR 71 | Ht 62.0 in | Wt 222.0 lb

## 2017-07-04 DIAGNOSIS — E782 Mixed hyperlipidemia: Secondary | ICD-10-CM

## 2017-07-04 DIAGNOSIS — I5032 Chronic diastolic (congestive) heart failure: Secondary | ICD-10-CM | POA: Diagnosis not present

## 2017-07-04 DIAGNOSIS — Z7901 Long term (current) use of anticoagulants: Secondary | ICD-10-CM | POA: Diagnosis not present

## 2017-07-04 DIAGNOSIS — I442 Atrioventricular block, complete: Secondary | ICD-10-CM | POA: Diagnosis not present

## 2017-07-04 DIAGNOSIS — Z950 Presence of cardiac pacemaker: Secondary | ICD-10-CM | POA: Diagnosis not present

## 2017-07-04 DIAGNOSIS — I480 Paroxysmal atrial fibrillation: Secondary | ICD-10-CM | POA: Diagnosis not present

## 2017-07-04 DIAGNOSIS — I350 Nonrheumatic aortic (valve) stenosis: Secondary | ICD-10-CM | POA: Diagnosis not present

## 2017-07-04 MED ORDER — APIXABAN 5 MG PO TABS: 5.0000 mg | ORAL_TABLET | Freq: Two times a day (BID) | ORAL | 3 refills | Status: DC

## 2017-07-04 NOTE — Patient Instructions (Signed)
Medication Instructions:   No medication changes made  Labwork:  No new labs needed  Testing/Procedures:  We will order an echocardiogram for shortness of breath, paced rhythm , atrial fibrillation   Follow-Up: It was a pleasure seeing you in the office today. Please call us if you have new issues that need to be addressed before your next appt.  [phone]  Your physician wants you to follow-up in: 12 months  You will receive a reminder letter in the mail two months in advance. If you don't receive a letter, please call our office to schedule the follow-up appointment.  If you need a refill on your cardiac medications before your next appointment, please call your pharmacy.

## 2017-07-06 ENCOUNTER — Other Ambulatory Visit: Payer: Self-pay | Admitting: Cardiovascular Disease

## 2017-07-06 DIAGNOSIS — I350 Nonrheumatic aortic (valve) stenosis: Secondary | ICD-10-CM

## 2017-07-06 DIAGNOSIS — I5032 Chronic diastolic (congestive) heart failure: Secondary | ICD-10-CM

## 2017-07-06 DIAGNOSIS — Z950 Presence of cardiac pacemaker: Secondary | ICD-10-CM

## 2017-07-06 DIAGNOSIS — I480 Paroxysmal atrial fibrillation: Secondary | ICD-10-CM

## 2017-07-06 DIAGNOSIS — I442 Atrioventricular block, complete: Secondary | ICD-10-CM

## 2017-07-11 ENCOUNTER — Ambulatory Visit (INDEPENDENT_AMBULATORY_CARE_PROVIDER_SITE_OTHER): Payer: Medicare Other

## 2017-07-11 DIAGNOSIS — Z950 Presence of cardiac pacemaker: Secondary | ICD-10-CM | POA: Diagnosis not present

## 2017-07-11 DIAGNOSIS — I5032 Chronic diastolic (congestive) heart failure: Secondary | ICD-10-CM | POA: Diagnosis not present

## 2017-07-11 NOTE — Progress Notes (Signed)
EPIC Encounter for ICM Monitoring  Patient Name: Jacqueline Lozano is a 83 y.o. female Date: 07/11/2017 Primary Care Physican: [name], MD Primary Cardiologist:[name] Electrophysiologist: [name] Weight:Does not weigh Bi-V Pacing: 99.9%      Heart Failure questions reviewed, pt asymptomatic.   Thoracic impedance normal.  Prescribed dosage: Furosemide 20 mg 1 tablet twice a day as needed but patient is taking 1 tablet 20 mg daily and taking 2nd tablet only when needed. Klor Con 10 mEq 1 tablet daily  Recommendations:  No changes.   Encouraged to call for fluid symptoms.  Follow-up plan: ICM clinic phone appointment on 08/11/2017.   Copy of ICM check sent to Dr. Caryl Comes.   3 month ICM trend: 07/11/2017    1 Year ICM trend:       Rosalene Billings, RN 07/11/2017 12:29 PM

## 2017-07-15 ENCOUNTER — Ambulatory Visit (INDEPENDENT_AMBULATORY_CARE_PROVIDER_SITE_OTHER): Payer: Medicare Other

## 2017-07-15 ENCOUNTER — Other Ambulatory Visit: Payer: Self-pay

## 2017-07-15 DIAGNOSIS — I5032 Chronic diastolic (congestive) heart failure: Secondary | ICD-10-CM

## 2017-07-15 DIAGNOSIS — Z950 Presence of cardiac pacemaker: Secondary | ICD-10-CM

## 2017-07-15 DIAGNOSIS — I480 Paroxysmal atrial fibrillation: Secondary | ICD-10-CM

## 2017-07-15 DIAGNOSIS — I350 Nonrheumatic aortic (valve) stenosis: Secondary | ICD-10-CM

## 2017-07-15 DIAGNOSIS — I442 Atrioventricular block, complete: Secondary | ICD-10-CM | POA: Diagnosis not present

## 2017-07-18 ENCOUNTER — Telehealth: Payer: Self-pay | Admitting: Cardiovascular Disease

## 2017-07-18 NOTE — Telephone Encounter (Signed)
Patient and daughter here in the office. They would like results of echo from this past Friday. She was concerned because she was told the aortic valve had worsened. Since Friday, every morning she has woke up in the morning she is more dizzy than usual. Taking her a few hours to get back to normal. She states "feels fine" right now. She reports occasional nausea through the day and some right lower flank pain. Advised to contact PCP regarding this. Advised patient that as soon as Dr Rockey Situ reviews echo results we will call her with results and plan of care. She and daughter were appreciative.

## 2017-07-18 NOTE — Telephone Encounter (Signed)
Patient daughter calling Patient had an ECHO on Friday 2/8 Tech had mentioned some information that has patient worried Daughter would like to bring patient by to speak with a nurse in person and get test results  Will be by sometime today

## 2017-07-18 NOTE — Telephone Encounter (Signed)
No answer. Left message to call back.   

## 2017-07-20 NOTE — Telephone Encounter (Signed)
Notified patient that Dr Rockey Situ has not reviewed the results and I will route to Dr Rockey Situ to let him know she is awaiting the results. Reassured that we will call her as soon as we can with results.

## 2017-07-20 NOTE — Telephone Encounter (Signed)
Patient calling to check status of echo result s

## 2017-07-21 NOTE — Telephone Encounter (Signed)
Patient verbalized understanding of the results. We discussed her feeling nauseas at times and shortness of breath when walking longer distances.  Advised patient to follow up with PCP regarding nausea and we discussed Dr Donivan Scull note of the following: "Shortness of breath Very short of breath with minimal exertion walking down the hallway PFTs with restrictive lung disease from obesity Unable to exercise or lose weight effectively given chronic knee pain Recommended calorie restriction for weight loss Echocardiogram ordered to evaluate aortic valve Previous workup for shortness of breath unrevealing"   She verbalized understanding of results and plan of care.

## 2017-07-21 NOTE — Telephone Encounter (Signed)
I do not see any change in the echo Normal function No significant change in aortic valve compared to previous study several years ago Do not know what would be causing her dizziness Check her blood pressure when she stands up, to rule out orthostasis

## 2017-08-11 ENCOUNTER — Encounter: Payer: Medicare Other | Admitting: *Deleted

## 2017-08-11 ENCOUNTER — Telehealth: Payer: Self-pay | Admitting: Cardiology

## 2017-08-11 NOTE — Telephone Encounter (Signed)
Spoke with pt and reminded pt of remote transmission that is due today. Pt verbalized understanding.   

## 2017-08-12 ENCOUNTER — Encounter: Payer: Self-pay | Admitting: Cardiology

## 2017-08-12 ENCOUNTER — Telehealth: Payer: Self-pay

## 2017-08-12 NOTE — Telephone Encounter (Signed)
Returned call to patient as requested by voice mail message.  She stated the monitor is not working.  Garlon Hatchet, [place] from device clinic submitted online request to have [employer] call but she has not received any calls yet.  Provided [employer] number and advised to call for assistance to fix monitor.

## 2017-08-15 ENCOUNTER — Ambulatory Visit (INDEPENDENT_AMBULATORY_CARE_PROVIDER_SITE_OTHER): Payer: Medicare Other | Admitting: *Deleted

## 2017-08-15 ENCOUNTER — Ambulatory Visit: Payer: Medicare Other | Admitting: Internal Medicine

## 2017-08-15 DIAGNOSIS — Z950 Presence of cardiac pacemaker: Secondary | ICD-10-CM

## 2017-08-15 DIAGNOSIS — I5032 Chronic diastolic (congestive) heart failure: Secondary | ICD-10-CM

## 2017-08-15 DIAGNOSIS — I442 Atrioventricular block, complete: Secondary | ICD-10-CM | POA: Diagnosis not present

## 2017-08-15 NOTE — Progress Notes (Signed)
EPIC Encounter for ICM Monitoring  Patient Name: Jacqueline Lozano is a 83 y.o. female Date: 08/15/2017 Primary Care Physican: [name], MD Primary Cardiologist:[name] Electrophysiologist: [name] Weight:Does not weigh Bi-V Pacing: 99.9%       Heart Failure questions reviewed, pt reported feet have been a little puffy.    Thoracic impedance close to baseline normal today but was abnormal suggesting fluid accumulation from 08/05/2017 until today.  Prescribed dosage: Furosemide 20 mg 1 tablet twice a day as needed but patient is taking 1 tablet 20 mg daily and taking 2nd tablet only when needed. Klor Con 10 mEq 1 tablet daily  Recommendations:  Patient said she would take 40 mg x 2 days and then return to the 20 mg daily.    Encouraged to call for fluid symptoms.  Follow-up plan: ICM clinic phone appointment on 09/15/2017.    Copy of ICM check sent to Dr. Caryl Comes.   3 month ICM trend: 08/15/2017    1 Year ICM trend:       Rosalene Billings, RN 08/15/2017 12:14 PM

## 2017-08-15 NOTE — Progress Notes (Signed)
Remote ICD transmission.   

## 2017-08-17 ENCOUNTER — Encounter: Payer: Self-pay | Admitting: Cardiology

## 2017-08-18 LAB — CUP PACEART REMOTE DEVICE CHECK
Brady Statistic AP VP Percent: 0 %
Brady Statistic AP VS Percent: 0 %
Brady Statistic AS VP Percent: 99.68 %
Brady Statistic RA Percent Paced: 0 %
Implantable Lead Implant Date: 20120106
Implantable Lead Location: 753858
Implantable Lead Model: 5076
Lead Channel Impedance Value: 1064 Ohm
Lead Channel Impedance Value: 4047 Ohm
Lead Channel Impedance Value: 4047 Ohm
Lead Channel Impedance Value: 437 Ohm
Lead Channel Impedance Value: 494 Ohm
Lead Channel Impedance Value: 665 Ohm
Lead Channel Impedance Value: 741 Ohm
Lead Channel Impedance Value: 817 Ohm
Lead Channel Pacing Threshold Amplitude: 1.875 V
Lead Channel Sensing Intrinsic Amplitude: 5.125 mV
Lead Channel Sensing Intrinsic Amplitude: 5.125 mV
Lead Channel Setting Pacing Amplitude: 2.5 V
MDC IDC LEAD IMPLANT DT: 20120106
MDC IDC LEAD LOCATION: 753860
MDC IDC MSMT BATTERY REMAINING LONGEVITY: 13 mo
MDC IDC MSMT BATTERY VOLTAGE: 2.87 V
MDC IDC MSMT LEADCHNL LV IMPEDANCE VALUE: 570 Ohm
MDC IDC MSMT LEADCHNL LV PACING THRESHOLD PULSEWIDTH: 1 ms
MDC IDC MSMT LEADCHNL RV PACING THRESHOLD AMPLITUDE: 0.875 V
MDC IDC MSMT LEADCHNL RV PACING THRESHOLD PULSEWIDTH: 0.4 ms
MDC IDC PG IMPLANT DT: 20120106
MDC IDC SESS DTM: 20190311132420
MDC IDC SET LEADCHNL RV PACING PULSEWIDTH: 0.4 ms
MDC IDC SET LEADCHNL RV SENSING SENSITIVITY: 0.9 mV
MDC IDC STAT BRADY AS VS PERCENT: 0.32 %
MDC IDC STAT BRADY RV PERCENT PACED: 99.79 %

## 2017-09-01 ENCOUNTER — Other Ambulatory Visit: Payer: Self-pay | Admitting: Cardiovascular Disease

## 2017-09-15 ENCOUNTER — Ambulatory Visit (INDEPENDENT_AMBULATORY_CARE_PROVIDER_SITE_OTHER): Payer: Medicare Other

## 2017-09-15 DIAGNOSIS — I5032 Chronic diastolic (congestive) heart failure: Secondary | ICD-10-CM | POA: Diagnosis not present

## 2017-09-15 DIAGNOSIS — Z950 Presence of cardiac pacemaker: Secondary | ICD-10-CM

## 2017-09-16 NOTE — Progress Notes (Signed)
EPIC Encounter for ICM Monitoring  Patient Name: TIMIA CASSELMAN is a 83 y.o. female Date: 09/16/2017 Primary Care Physican: [name], MD Primary Cardiologist:[name] Electrophysiologist: [name] Weight:Does not weigh Bi-V Pacing: 99.7%      Heart Failure questions reviewed, pt asymptomatic.   Thoracic impedance normal.  Prescribed dosage: Furosemide 20 mg 1 tablet twice a day as needed but patient is taking 1 tablet 20 mg daily and taking 2nd tablet only when needed. Klor Con 10 mEq 1 tablet daily  Recommendations: No changes.   Encouraged to call for fluid symptoms.  Follow-up plan: ICM clinic phone appointment on 10/17/2017.    Copy of ICM check sent to Dr. Caryl Comes.   3 month ICM trend: 09/15/2017    1 Year ICM trend:       Rosalene Billings, RN 09/16/2017 12:56 PM

## 2017-10-17 ENCOUNTER — Ambulatory Visit (INDEPENDENT_AMBULATORY_CARE_PROVIDER_SITE_OTHER): Payer: Medicare Other

## 2017-10-17 DIAGNOSIS — Z950 Presence of cardiac pacemaker: Secondary | ICD-10-CM

## 2017-10-17 DIAGNOSIS — I5032 Chronic diastolic (congestive) heart failure: Secondary | ICD-10-CM

## 2017-10-18 ENCOUNTER — Telehealth: Payer: Self-pay

## 2017-10-18 NOTE — Progress Notes (Signed)
EPIC Encounter for ICM Monitoring  Patient Name: Jacqueline Lozano is a 83 y.o. female Date: 10/18/2017 Primary Care Physican: [name], MD Primary Cardiologist:[name] Electrophysiologist: [name] Weight:Does not weigh Bi-V Pacing: 99.6%  Attempted call to patient and unable to reach.  Left detailed message, per DPR, regarding transmission.  Transmission reviewed.    Thoracic impedance normal.  Prescribed dosage: Furosemide 20 mg 1 tablet twice a day as needed but patient is taking 1 tablet 20 mg daily and taking 2nd tablet only when needed. Klor Con 10 mEq 1 tablet daily  Recommendations: Left voice mail with ICM number and encouraged to call if experiencing any fluid symptoms.  Follow-up plan: ICM clinic phone appointment on 11/17/2017.    Copy of ICM check sent to Dr. Caryl Comes.   3 month ICM trend: 10/17/2017    1 Year ICM trend:       Rosalene Billings, RN 10/18/2017 10:44 AM

## 2017-10-18 NOTE — Telephone Encounter (Signed)
Remote ICM transmission received.  Attempted call to patient and left detailed message, per DPR, regarding transmission and next ICM scheduled for 11/17/2017.  Advised to return call for any fluid symptoms or questions.    

## 2017-11-17 ENCOUNTER — Encounter: Payer: Self-pay | Admitting: Cardiology

## 2017-11-17 ENCOUNTER — Ambulatory Visit (INDEPENDENT_AMBULATORY_CARE_PROVIDER_SITE_OTHER): Payer: Medicare Other | Admitting: *Deleted

## 2017-11-17 DIAGNOSIS — Z950 Presence of cardiac pacemaker: Secondary | ICD-10-CM

## 2017-11-17 DIAGNOSIS — I5032 Chronic diastolic (congestive) heart failure: Secondary | ICD-10-CM | POA: Diagnosis not present

## 2017-11-17 DIAGNOSIS — I442 Atrioventricular block, complete: Secondary | ICD-10-CM

## 2017-11-17 NOTE — Progress Notes (Signed)
EPIC Encounter for ICM Monitoring  Patient Name: Jacqueline Lozano is a 83 y.o. female Date: 11/17/2017 Primary Care Physican: [name], MD Primary Cardiologist:[name] Electrophysiologist: [name] Weight:Does not weigh Bi-V Pacing: 99.5%       Heart Failure questions reviewed, pt chronic shortness of breath.   Thoracic impedance slightly below baseline normal suggesting fluid accumulation.  Prescribed dosage: Furosemide 20 mg 1 tablet twice a day as needed but patient is taking 1 tablet 20 mg daily and taking 2nd tablet only when needed. Klor Con 10 mEq 1 tablet daily  Recommendations: Encouraged her to take an extra Furosemide 20 mg 1 tablet x 2 day.  Encouraged to call for fluid symptoms.  Follow-up plan: ICM clinic phone appointment on 12/19/2017.    Copy of ICM check sent to Dr. Caryl Comes and Dr. Rockey Situ.   3 month ICM trend: 11/17/2017    1 Year ICM trend:       Rosalene Billings, RN 11/17/2017 2:46 PM

## 2017-11-17 NOTE — Progress Notes (Signed)
Remote ICD transmission.   

## 2017-11-22 LAB — CUP PACEART REMOTE DEVICE CHECK
Battery Remaining Longevity: 10 mo
Brady Statistic AP VP Percent: 0 %
Brady Statistic AP VS Percent: 0 %
Brady Statistic AS VS Percent: 0.55 %
Brady Statistic RV Percent Paced: 99.5 %
Date Time Interrogation Session: 20190613130029
Implantable Lead Implant Date: 20120106
Implantable Lead Implant Date: 20120106
Implantable Lead Location: 753860
Lead Channel Impedance Value: 4047 Ohm
Lead Channel Impedance Value: 418 Ohm
Lead Channel Impedance Value: 494 Ohm
Lead Channel Impedance Value: 551 Ohm
Lead Channel Impedance Value: 665 Ohm
Lead Channel Impedance Value: 817 Ohm
Lead Channel Pacing Threshold Amplitude: 0.75 V
Lead Channel Pacing Threshold Amplitude: 1.875 V
Lead Channel Pacing Threshold Pulse Width: 1 ms
Lead Channel Sensing Intrinsic Amplitude: 5.125 mV
Lead Channel Setting Sensing Sensitivity: 0.9 mV
MDC IDC LEAD LOCATION: 753858
MDC IDC MSMT BATTERY VOLTAGE: 2.86 V
MDC IDC MSMT LEADCHNL LV IMPEDANCE VALUE: 1045 Ohm
MDC IDC MSMT LEADCHNL LV IMPEDANCE VALUE: 665 Ohm
MDC IDC MSMT LEADCHNL RA IMPEDANCE VALUE: 4047 Ohm
MDC IDC MSMT LEADCHNL RV PACING THRESHOLD PULSEWIDTH: 0.4 ms
MDC IDC MSMT LEADCHNL RV SENSING INTR AMPL: 5.125 mV
MDC IDC PG IMPLANT DT: 20120106
MDC IDC SET LEADCHNL RV PACING AMPLITUDE: 2.5 V
MDC IDC SET LEADCHNL RV PACING PULSEWIDTH: 0.4 ms
MDC IDC STAT BRADY AS VP PERCENT: 99.45 %
MDC IDC STAT BRADY RA PERCENT PACED: 0 %

## 2017-12-14 ENCOUNTER — Telehealth: Payer: Self-pay | Admitting: Cardiovascular Disease

## 2017-12-14 NOTE — Telephone Encounter (Signed)
Pt calling stating she's not feeling well, she feels like she's been stabbed in the lungs   She states will call her PCP after this call but wanted [place] to know.  She states she is not really wanting to go ER but will if we recommend it  [facility]

## 2017-12-14 NOTE — Telephone Encounter (Addendum)
I called and spoke with the patient. She states that she has had some chest pain just below her pacemaker and pain in her back around her lungs with deep breathing.  She has had intermittent worsening of symptoms over the last 2 days. She currently feels at her baseline. She is typically SOB and this is no worse.  She denies any increased swelling or fever. The patient states she called here to discuss her symptoms as well as Dr. Barbarann Ehlers office. Per the patient, Dr. Kary Kos is out on vacation this week.  I advised the patient that the discomfort in her lungs with deep breathing does not appear to be cardiac in nature. I advised her in Dr. Barbarann Ehlers absence I would review her symptoms with Dr. Rockey Situ and we will call her back to review.   She has been advised in the interim to report to the ER for worsening symptoms.  She voices understanding.

## 2017-12-14 NOTE — Telephone Encounter (Signed)
Reviewed with Dr Rockey Situ. He does not feel this is cardiac in nature and advises patient to follow up with primary care concerning this discomfort. Patient verbalized understanding of these recommendations. She did call her PCP and has a B12 shot on Friday and will discuss with them at that time. Pt verbalized understanding to call 911 or go to the emergency room, if he develops any new or worsening symptoms.

## 2017-12-19 ENCOUNTER — Ambulatory Visit (INDEPENDENT_AMBULATORY_CARE_PROVIDER_SITE_OTHER): Payer: Medicare Other

## 2017-12-19 DIAGNOSIS — Z950 Presence of cardiac pacemaker: Secondary | ICD-10-CM | POA: Diagnosis not present

## 2017-12-19 DIAGNOSIS — I5032 Chronic diastolic (congestive) heart failure: Secondary | ICD-10-CM | POA: Diagnosis not present

## 2017-12-19 NOTE — Progress Notes (Signed)
EPIC Encounter for ICM Monitoring  Patient Name: Jacqueline Lozano is a 83 y.o. female Date: 12/19/2017 Primary Care Physican: [name], MD Primary Cardiologist:[name] Electrophysiologist: [name] Weight:Does not weigh Bi-V Pacing: 99.4%        Heart Failure questions reviewed, pt asymptomatic.   Thoracic impedance normal.  Prescribed dosage: Furosemide 20 mg 1 tablet twice a day as needed but patient is taking 1 tablet 20 mg daily and taking 2nd tablet only when needed. Klor Con 10 mEq 1 tablet daily  Recommendations: No changes.   Encouraged to call for fluid symptoms.  Follow-up plan: ICM clinic phone appointment on 01/19/2018.    Copy of ICM check sent to Dr. Caryl Comes.   3 month ICM trend: 01/19/2018    1 Year ICM trend:       Rosalene Billings, RN 12/19/2017 3:00 PM

## 2018-01-03 ENCOUNTER — Telehealth: Payer: Self-pay | Admitting: Cardiovascular Disease

## 2018-01-03 NOTE — Telephone Encounter (Signed)
Pt calling stating she is in donut hole and now her Eliquis is going to cost her $500 a month  Would like some advise since that is too much to afford

## 2018-01-05 MED ORDER — APIXABAN 5 MG PO TABS: 5.0000 mg | ORAL_TABLET | Freq: Two times a day (BID) | ORAL | 3 refills | Status: DC

## 2018-01-05 NOTE — Telephone Encounter (Signed)
Spoke with patient and reviewed that we could provide her with assistance application to complete and see if she would qualify for assistance. She states that her daughter will be able to help her fill it out. Advised that I would complete as much as we could and then leave it up front for her to pick up. She was appreciative for the call and information with no further questions at this time.  Medication Samples have been provided to the patient.  Drug name: Eliquis       Strength: 5 mg        Qty: 2 boxes  LOT: [id number]  Exp.Date: Jun 2021 Will have provider sign forms and then put up front for patient to pick up.

## 2018-01-19 ENCOUNTER — Ambulatory Visit (INDEPENDENT_AMBULATORY_CARE_PROVIDER_SITE_OTHER): Payer: Medicare Other

## 2018-01-19 DIAGNOSIS — Z950 Presence of cardiac pacemaker: Secondary | ICD-10-CM | POA: Diagnosis not present

## 2018-01-19 DIAGNOSIS — I5032 Chronic diastolic (congestive) heart failure: Secondary | ICD-10-CM

## 2018-01-20 NOTE — Progress Notes (Signed)
EPIC Encounter for ICM Monitoring  Patient Name: Jacqueline Lozano is a 83 y.o. female Date: 01/20/2018 Primary Care Physican: [name], MD Primary Cardiologist:[name] Electrophysiologist: [name] Weight:Does not weigh Bi-V Pacing: 99.5%      Attempted call to patient and unable to reach.  Left detailed message, per DPR, regarding transmission.  Transmission reviewed.    Thoracic impedance normal.  Prescribed dosage: Furosemide 20 mg 1 tablet twice a day as needed. Patient taking differently - 1 tablet 20 mg daily and taking 2nd tablet only when needed. Klor Con 10 mEq 1 tablet daily  Recommendations: Left voice mail with ICM number and encouraged to call if experiencing any fluid symptoms.  Follow-up plan: ICM clinic phone appointment on 02/20/2018.    Copy of ICM check sent to Dr. Caryl Comes.   3 month ICM trend: 01/19/2018    1 Year ICM trend:       Rosalene Billings, RN 01/20/2018 9:21 AM

## 2018-02-20 ENCOUNTER — Ambulatory Visit: Payer: Medicare Other

## 2018-02-20 ENCOUNTER — Ambulatory Visit (INDEPENDENT_AMBULATORY_CARE_PROVIDER_SITE_OTHER): Payer: Medicare Other | Admitting: *Deleted

## 2018-02-20 DIAGNOSIS — I5032 Chronic diastolic (congestive) heart failure: Secondary | ICD-10-CM

## 2018-02-20 DIAGNOSIS — Z950 Presence of cardiac pacemaker: Secondary | ICD-10-CM

## 2018-02-20 DIAGNOSIS — I480 Paroxysmal atrial fibrillation: Secondary | ICD-10-CM | POA: Diagnosis not present

## 2018-02-20 NOTE — Progress Notes (Signed)
Remote pacemaker transmission.   

## 2018-02-20 NOTE — Progress Notes (Signed)
EPIC Encounter for ICM Monitoring  Patient Name: Jacqueline Lozano is a 83 y.o. female Date: 02/20/2018 Primary Care Physican: [name], MD Primary Cardiologist:[name] Electrophysiologist: [name] Weight:Does not weigh Bi-V Pacing: 99.5%           Heart Failure questions reviewed, pt symptomatic for >1 week with lightheadedness, dizziness, general weakness, and extremely short of breath.   She used to be able to walk around in the house without feeling short of breath but now stops between rooms to rest due to breathing difficulty.     Thoracic impedance abnormal suggesting fluid accumulation starting 02/03/2018 and has been trending just under the baseline for last 2 days.  Being so close to baseline does not correlate with patient's symptoms.    Prescribed: Furosemide 20 mg 1 tablet twice a day as needed. Patient taking differently - 1 tablet 20 mg daily. Klor Con 10 mEq 1 tablet daily.  Recommendations:  Advised to take 2nd Furosemide tablet today and take 2 tablets (40 mg total) daily x 2 days.  Advised to call back if symptoms do not improve or use ER for worsening symptoms.  Follow-up plan: ICM clinic phone appointment on 03/02/2018 to recheck fluid levels.       Copy of ICM check sent to Dr. Rockey Situ and Dr Caryl Comes.   3 month ICM trend: 02/20/2018    1 Year ICM trend:     Rosalene Billings, RN 02/20/2018 3:04 PM

## 2018-02-21 NOTE — Progress Notes (Signed)
Received: Yesterday  Message Contents  Jacqueline Merritts, MD  Short, Laurie Panda, RN        Serious chronic SOB felt secondary to weight and deconditioning  Unable to lose weight or exercise  thx  TG

## 2018-02-21 NOTE — Progress Notes (Signed)
Discussed symptoms with Dr Caryl Comes. He had no further recommendations and to proceed with taking extra Furosemide as instructed.  Call to patient and she said she feels the same as yesterday.  She remains short of breath, weak, dizzy and lightheaded but is no worse.  She is taking extra Furosemide as recommended. No changes today.  Advised to use ER if symptoms worsen.

## 2018-02-22 ENCOUNTER — Other Ambulatory Visit: Payer: Self-pay | Admitting: Cardiovascular Disease

## 2018-02-22 NOTE — Telephone Encounter (Signed)
Please advise if ok to refill. 

## 2018-02-27 ENCOUNTER — Telehealth: Payer: Self-pay

## 2018-02-27 NOTE — Telephone Encounter (Signed)
Returned call to patient as requested by voice mail message.  She asked of she return to the temporary dose of 2 tablets of Furosemide or the prescribed dosage because she does not feel she is urinating a lot in the last 2 days.  She said feet are swollen.  She reported the extra Furosemide tablet did not improve the shortness of breath.  She said she has spoken to several doctors about the shortness of breath and cannot determine why her breathing does not improve. Advised she should be taking prescribed Furosemide dosage and does not need to take the temporary dosage of 2 tablets.  She will send remote transmission for review today instead of planned date of 9/26.  Advise will review transmission and call her back.

## 2018-02-28 ENCOUNTER — Telehealth: Payer: Self-pay | Admitting: Cardiovascular Disease

## 2018-02-28 NOTE — Telephone Encounter (Signed)
Reviewed with Dr Caryl Comes and in agreement with holding Lasix today, and tomorrow if needed, and increase fluid intake.  Patient to call back on 03/02/2018 with update.  Appointment with Christell Faith PA on 04/04/2018.

## 2018-02-28 NOTE — Telephone Encounter (Signed)
No answer. Left message to call back.   

## 2018-02-28 NOTE — Telephone Encounter (Signed)
Pt c/o Shortness Of Breath: STAT if SOB developed within the last 24 hours or pt is noticeably SOB on the phone  1. Are you currently SOB (can you hear that pt is SOB on the phone)? yes  2. How long have you been experiencing SOB? About 2-3 weeks  3. Are you SOB when sitting or when up moving around? Anytime all the time  4. Are you currently experiencing any other symptoms? dizziness

## 2018-02-28 NOTE — Telephone Encounter (Signed)
The patient has been followed in [facility] clinic.  See ICM encounter from 02/20/18 and phone note from 9/23.   To Dr. Caryl Comes and Dr. Rockey Situ as an Jacqueline Lozano.

## 2018-02-28 NOTE — Telephone Encounter (Signed)
Patient still having sob daughter is taking her to the ED at [facility]

## 2018-02-28 NOTE — Telephone Encounter (Addendum)
Spoke with patient.  Reviewed remote transmission and advised the report suggests no fluid accumulation since taking extra Furosemide on 9/16 and 9/17.  Thoracic impedance is at baseline.  She reported feet are swollen, continued shortness of breath and lightheadedness.  She reports decrease in urine output for past 2 days.  Patient takes Lasix daily instead of PRN.  Advised to hold Lasix dose today and tomorrow if needed and increase fluids today.  Encouraged to make an appointment with Dr Rockey Situ or PCP since extra Lasix did not improve symptoms.  She said she would rather see Dr Rockey Situ and will call for any appointment.  Advised if symptoms worsen or urination continues to decrease to go to ER.  Advised her to call 9/26 with update on how she is feeling.

## 2018-03-02 ENCOUNTER — Other Ambulatory Visit: Payer: Self-pay | Admitting: Cardiovascular Disease

## 2018-03-02 NOTE — Telephone Encounter (Signed)
Noted going to ER 

## 2018-03-03 NOTE — Telephone Encounter (Signed)
Patient calling  Wants to discuss what steps to take next from ED visit yesterday - should she follow up sooner than her 10/29 appointment with R. Dunn Please call to discuss

## 2018-03-03 NOTE — Telephone Encounter (Signed)
I called and spoke with the patient. She states that she went to the walk in clinic at [facility] this week when her urine output was down. She was evaluated by Dr. Deloria Lair. A UA was done- the patient states she was called and advised no antibiotic was needed. She states over the last 10 days her fluid levels have been up and then down. It was thought that she was dehydrated. Dr. Deloria Lair advised the patient to take lasix 20 mg one tablet every other day and follow up with cardiology. The patient states Dr. Deloria Lair spoke with cardiology, but I seen no record of this happening. She states she is feeling well today. She is urinating adequately. She is scheduled to see Christell Faith, PA on 04/04/18 for follow up. I have advised her that we can move up her follow up to 03/10/18 with Ignacia Bayley, NP to reassess her fluid status. The patient is agreeable.  I have advised to call the office/ Margarita Grizzle in [facility] clinic if she feels like her weight is trending up. She voices understanding.

## 2018-03-10 ENCOUNTER — Encounter: Payer: Self-pay | Admitting: Cardiovascular Disease

## 2018-03-10 ENCOUNTER — Ambulatory Visit (INDEPENDENT_AMBULATORY_CARE_PROVIDER_SITE_OTHER): Payer: Medicare Other | Admitting: Cardiovascular Disease

## 2018-03-10 ENCOUNTER — Ambulatory Visit: Payer: Medicare Other | Admitting: Nurse Practitioner

## 2018-03-10 ENCOUNTER — Telehealth: Payer: Self-pay | Admitting: Cardiology

## 2018-03-10 VITALS — BP 148/84 | HR 72 | Ht 62.0 in | Wt 225.5 lb

## 2018-03-10 DIAGNOSIS — R0602 Shortness of breath: Secondary | ICD-10-CM

## 2018-03-10 DIAGNOSIS — I10 Essential (primary) hypertension: Secondary | ICD-10-CM

## 2018-03-10 DIAGNOSIS — E782 Mixed hyperlipidemia: Secondary | ICD-10-CM

## 2018-03-10 DIAGNOSIS — I4821 Permanent atrial fibrillation: Secondary | ICD-10-CM | POA: Diagnosis not present

## 2018-03-10 DIAGNOSIS — I442 Atrioventricular block, complete: Secondary | ICD-10-CM

## 2018-03-10 DIAGNOSIS — Z950 Presence of cardiac pacemaker: Secondary | ICD-10-CM

## 2018-03-10 DIAGNOSIS — I5032 Chronic diastolic (congestive) heart failure: Secondary | ICD-10-CM

## 2018-03-10 NOTE — Patient Instructions (Signed)
I will talk with Dr. Caryl Comes and electrical folks in [place] about the pacer   Medication Instructions:   No medication changes made  Labwork:  No new labs needed  Testing/Procedures:  No further testing at this time   Follow-Up: It was a pleasure seeing you in the office today. Please call us if you have new issues that need to be addressed before your next appt.  [phone]  Your physician wants you to follow-up in: 12 months.  You will receive a reminder letter in the mail two months in advance. If you don't receive a letter, please call our office to schedule the follow-up appointment.  If you need a refill on your cardiac medications before your next appointment, please call your pharmacy.  For educational health videos Log in to : [email or web address] Or : [email or web address], password : [email or web address]

## 2018-03-10 NOTE — Telephone Encounter (Signed)
LMOVM reminding pt to send remote transmission.   

## 2018-03-10 NOTE — Progress Notes (Signed)
Cardiology Office Note  Date:  03/10/2018   ID:  INIS BORNEMAN, DOB May 07, 1935, MRN [id number]  PCP:  [name], MD   Chief Complaint  Patient presents with  . other    f/u from [facility] walk in SOB and fluid. Medications reviewed verbally by patient.     HPI:  Mrs Kosak is a pleasant 83 -year-old woman with  Chronic shortness of breath morbid obesity, deconditioning Chronic bilateral knee pain who has declined joint surgery Permanent atrial fibrillation,  status post arteriovenous node ablation, Pacer/CRT-P,  ventricularly paced Hypertension,  Hypothyroidism,  Diastolic congestive heart failure,  C. difficile infection,  Echo 07/2014: mild AS who presents for followup of her chronic shortness of breath, pacemaker  Chronic shortness of breath Echocardiogram February 2019 Ejection fraction 60 to 65%, mild aortic valve stenosis,   Seen by Dr. Ubaldo Glassing Stress test 03/2017: normal study  Seen by Dr. Caryl Comes November 2018 No pacemaker changes made  Chronic shortness of breath over the past several years She reports having worsening shortness of breath this visit optivol  low reading, the feel she has fluid Typically on device check , optivol is not particularly elevated Denies any leg swelling, no PND or orthopnea.  Feels fine at rest,  symptoms on minimal exertion.  Unable to walk 10 feet without having to stop secondary to shortness of breath Feels like nobody is listening to her symptoms Uses a walker and gives out very quickly  On prior office visit  we did walk in the office, rate response was appropriate, hit 98 bpm walking down the hall, no drop in oxygen saturation, maintained for the 95%. She was very short of breath, had difficulty walking back to the room, had to walk very slowly, take breaks  Previously seen by pulmonary PFT's shows restrictive lung disease likely from her obseity  Unable to exercise Unable to participate in pulmonary rehabilitation given  severe chronic knee pain We have recommended water aerobics, she has not participated  Previously on symbicort, unclear if this is making a difference, living off samples in the past . Stopped, expensive  EKG personally reviewed by myself on todays visit Shows paced rhythm 72 bpm, V paced wide-complex  Other past medical history reviewed  Previous echocardiogram 2016 showed mild to moderate aortic valve stenosis, normal EF, normal right heart pressures  Last stress test 2014  unable to participate in pulmonary rehabilitation secondary to chronic knee pain, osteoarthritis  echocardiogram shows normal LV function, stable valve disease, normal right ventricular systolic pressures.  Previous visit to the emergency room for atypical chest pain. Cardiac enzymes were negative and she was discharged home Prior workup with echocardiogram in 2014 and stress test were unrevealing.   sleep study in October and again in November. Sleep study in October suggested she repeat the test as it was incomplete. The note indicates the study was discontinued as the patient had heart block, with limited EKG monitoring appearing to demonstrate a 43 second period of complete heart block with ventricular standstill. Atrial rate was 80 beats per minute. Note indicates the patient was awake and alert during the event with some dizziness. Pacer was checked after this event,  Normal functioning device.  Followup sleep study 04/24/2013 recommended weight loss, though there was no indications for CPAP at this time. Previous stress test  early 2014 showed no ischemia  PMH:   has a past medical history of Abnormal echocardiogram (09/2009), Allergic to IV contrast, Chronic diastolic CHF (congestive heart failure) ([place]),  Complete heart block-S./P. AV junction ablation (11/23/2010), GERD (gastroesophageal reflux disease), Hyperlipidemia, Hypertension, Hypothyroidism, Migraine, OSA (obstructive sleep apnea),  Pacemaker-Medtronic CRT P. (11/23/2010), PAD (peripheral artery disease) ([place]), and Paroxysmal atrial fibrillation ([place]).  PSH:    Past Surgical History:  Procedure Laterality Date  . APPENDECTOMY    . ATRIAL ABLATION SURGERY  06/12/2010  . CARDIAC CATHETERIZATION     Left heart cath x3 in teh past, last 5 yrs ago. All "normal" per pt report  . CARDIOVERSION    . CARPAL TUNNEL RELEASE    . CHOLECYSTECTOMY OPEN    . COLONOSCOPY WITH PROPOFOL N/A 09/08/2015   Procedure: COLONOSCOPY WITH PROPOFOL;  Surgeon: Josefine Class, MD;  Location[facility] ENDOSCOPY;  Service: Endoscopy;  Laterality: N/A;  . ESOPHAGOGASTRODUODENOSCOPY (EGD) WITH PROPOFOL N/A 09/08/2015   Procedure: ESOPHAGOGASTRODUODENOSCOPY (EGD) WITH PROPOFOL;  Surgeon: Josefine Class, MD;  [facility];  Service: Endoscopy;  Laterality: N/A;  . PACEMAKER INSERTION  06/12/2010  . VESICOVAGINAL FISTULA CLOSURE W/ TAH      Current Outpatient Medications  Medication Sig Dispense Refill  . apixaban (ELIQUIS) 5 MG TABS tablet Take 1 tablet (5 mg total) by mouth 2 (two) times daily. 180 tablet 3  . famotidine (PEPCID) 10 MG tablet Take 10 mg by mouth daily as needed. Reported on 08/19/2015    . furosemide (LASIX) 20 MG tablet TAKE ONE TABLET BY MOUTH TWICE DAILY AS NEEDED 180 tablet 3  . GuaiFENesin (MUCINEX PO) Take by mouth as needed.    Marland Kitchen KLOR-CON M10 10 MEQ tablet TAKE ONE TABLET BY MOUTH ONCE DAILY 90 tablet 3  . levothyroxine (SYNTHROID, LEVOTHROID) 88 MCG tablet Take 88 mcg by mouth daily.    . meclizine (ANTIVERT) 25 MG tablet TAKE ONE TABLET BY MOUTH THREE TIMES DAILY AS NEEDED 90 tablet 1  . metoprolol tartrate (LOPRESSOR) 50 MG tablet TAKE 1 & 1/2 (ONE & ONE-HALF) TABLETS BY MOUTH TWICE DAILY 270 tablet 0  . nitroGLYCERIN (NITROSTAT) 0.4 MG SL tablet Place 1 tablet (0.4 mg total) under the tongue every 5 (five) minutes as needed for chest pain. 25 tablet 3  . prochlorperazine (COMPAZINE) 5 MG tablet Take 1 tablet  (5 mg total) by mouth every 6 (six) hours as needed. 90 tablet 5  . VITAMIN B1-B12 IM Inject into the muscle every 30 (thirty) days.     No current facility-administered medications for this visit.      Allergies:   Ivp dye [iodinated diagnostic agents]; Other; Codeine; Codeine sulfate; Dipyridamole; Iodine; Morphine; Oxycodone-acetaminophen; Oxycodone-acetaminophen; Shellfish-derived products; Statins; Sulfa antibiotics; Sulfonamide derivatives; and Amlodipine   Social History:  The patient  reports that she has never smoked. She has never used smokeless tobacco. She reports that she does not drink alcohol or use drugs.   Family History:   family history includes Colon cancer in her daughter; Heart attack in her father and mother.    Review of Systems: Review of Systems  Constitutional: Negative.   Respiratory: Positive for shortness of breath.   Cardiovascular: Negative.   Gastrointestinal: Negative.   Musculoskeletal: Negative.   Neurological: Negative.   Psychiatric/Behavioral: Negative.   All other systems reviewed and are negative.    PHYSICAL EXAM: VS:  BP (!) 148/84 (BP Location: Left Arm, Patient Position: Sitting, Cuff Size: Normal)   Pulse 72   Ht 5\' 2"  (1.575 m)   Wt 225 lb 8 oz (102.3 kg)   BMI 41.24 kg/m  , BMI Body mass index is 41.24 kg/m.  Constitutional:  oriented to person, place, and time. No distress. Obese HENT:  Head: Normocephalic and atraumatic.  Eyes:  no discharge. No scleral icterus.  Neck: Normal range of motion. Neck supple. No JVD present.  Cardiovascular: Normal rate, regular rhythm, normal heart sounds and intact distal pulses. Exam reveals no gallop and no friction rub. No edema No murmur heard. Pulmonary/Chest: Effort normal and breath sounds normal. No stridor. No respiratory distress.  no wheezes.  no rales.  no tenderness.  Abdominal: Soft.  no distension.  no tenderness.  Musculoskeletal: Normal range of motion.  no  tenderness or  deformity.  Neurological:  normal muscle tone. Coordination normal. No atrophy Skin: Skin is warm and dry. No rash noted. not diaphoretic.  Psychiatric:  normal mood and affect. behavior is normal. Thought content normal.    Recent Labs: 06/14/2017: BUN 25; Creatinine, Ser 1.36; Hemoglobin 12.8; Platelets 248; Potassium 4.4; Sodium 136    Lipid Panel Lab Results  Component Value Date   CHOL 294 (H) 09/30/2009   HDL 44 09/30/2009   LDLCALC See Comment mg/dL 09/30/2009   TRIG 459 (H) 09/30/2009      Wt Readings from Last 3 Encounters:  03/10/18 225 lb 8 oz (102.3 kg)  07/04/17 222 lb (100.7 kg)  04/13/17 221 lb 8 oz (100.5 kg)       ASSESSMENT AND PLAN:  Atrial fibrillation, unspecified type ([place]) - Plan: EKG 12-Lead On anticoagulation, Prior AV node ablation  Shortness of breath Very short of breath with minimal exertion walking down the hallway This has been a chronic issue but she reports is getting worse Echocardiogram and stress test without significant changes with normal EF There is likely a conditioning component but need to rule out complications from ventricularly paced rhythm Will refer to Dr. Caryl Comes.  Wide-complex ventricularly paced  Pacemaker-Medtronic CRT P. Managed by Dr. Caryl Comes.  Appointment set up for next week Unclear if there are pacer changes that can be made to help with her worsening shortness of breath On prior office visit appeared to have adequate rate response but was profoundly short of breath with exertion  Chronic diastolic CHF (congestive heart failure) ([place]) optivol is low, should not be contributing to her shortness of breath symptoms  Complete heart block-S./P. AV junction ablation pacemaker , ventricularly paced  Long discussion with her concerning previous work-up with the past 2 years including echocardiogram, stress testing, PFTs, other causes of shortness of breath, looked at pacer downloads.  We have reviewed all the data that  might contribute to her symptoms in detail with her today Detailed discussion concerning potential options for her pacer Discussed with nursing, new appointments made with EP  Total encounter time more than 45 minutes  Greater than 50% was spent in counseling and coordination of care with the patient  Disposition:   F/U  12 months   No orders of the defined types were placed in this encounter.    Signed, Esmond Plants, M.D., Ph.D. 03/10/2018  [place], [place]

## 2018-03-14 ENCOUNTER — Encounter: Payer: Self-pay | Admitting: Internal Medicine

## 2018-03-14 ENCOUNTER — Ambulatory Visit (INDEPENDENT_AMBULATORY_CARE_PROVIDER_SITE_OTHER): Payer: Medicare Other | Admitting: Internal Medicine

## 2018-03-14 ENCOUNTER — Telehealth: Payer: Self-pay

## 2018-03-14 VITALS — BP 132/60 | HR 71 | Ht 62.0 in | Wt 222.0 lb

## 2018-03-14 DIAGNOSIS — I442 Atrioventricular block, complete: Secondary | ICD-10-CM | POA: Diagnosis not present

## 2018-03-14 DIAGNOSIS — I4821 Permanent atrial fibrillation: Secondary | ICD-10-CM

## 2018-03-14 DIAGNOSIS — Z950 Presence of cardiac pacemaker: Secondary | ICD-10-CM

## 2018-03-14 LAB — CUP PACEART INCLINIC DEVICE CHECK
Battery Voltage: 2.84 V
Brady Statistic RA Percent Paced: 0 %
Date Time Interrogation Session: 20191008151921
Implantable Lead Implant Date: 20120106
Implantable Lead Location: 753858
Implantable Lead Model: 5076
Implantable Pulse Generator Implant Date: 20120106
Lead Channel Impedance Value: 4047 Ohm
Lead Channel Impedance Value: 589 Ohm
Lead Channel Impedance Value: 817 Ohm
Lead Channel Pacing Threshold Amplitude: 0.75 V
Lead Channel Pacing Threshold Pulse Width: 0.4 ms
Lead Channel Pacing Threshold Pulse Width: 1 ms
Lead Channel Setting Pacing Amplitude: 2.5 V
Lead Channel Setting Pacing Pulse Width: 0.4 ms
Lead Channel Setting Sensing Sensitivity: 0.9 mV
MDC IDC LEAD IMPLANT DT: 20120106
MDC IDC LEAD LOCATION: 753860
MDC IDC MSMT BATTERY REMAINING LONGEVITY: 6 mo
MDC IDC MSMT LEADCHNL LV IMPEDANCE VALUE: 1064 Ohm
MDC IDC MSMT LEADCHNL LV IMPEDANCE VALUE: 665 Ohm
MDC IDC MSMT LEADCHNL LV IMPEDANCE VALUE: 722 Ohm
MDC IDC MSMT LEADCHNL LV PACING THRESHOLD AMPLITUDE: 1.875 V
MDC IDC MSMT LEADCHNL RA IMPEDANCE VALUE: 4047 Ohm
MDC IDC MSMT LEADCHNL RV IMPEDANCE VALUE: 437 Ohm
MDC IDC MSMT LEADCHNL RV IMPEDANCE VALUE: 475 Ohm
MDC IDC STAT BRADY AP VP PERCENT: 0 %
MDC IDC STAT BRADY AP VS PERCENT: 0 %
MDC IDC STAT BRADY AS VP PERCENT: 99.15 %
MDC IDC STAT BRADY AS VS PERCENT: 0.85 %
MDC IDC STAT BRADY RV PERCENT PACED: 99.36 %

## 2018-03-14 NOTE — Progress Notes (Signed)
Patient Care Team: [name], MD as PCP - General (Family Medicine) Rockey Situ [name], MD as Consulting Physician (Cardiology)   HPI  Jacqueline Lozano is a 83 y.o. female seen in followup for pacemaker implantation for tachybradycardia syndrome. She has a history of permanent atrial fibrillation and status post CRT P. she is status post AV junction ablation  LV lead has been permanently turned off because of diaphragmatic stimulation  She has chronic shortness of breath.  This has been looked at considerably. Dr. Dollene Cleveland notes from 10/18 were reviewed.  014   She also had significant hypertension and amlodipine was added. Blood pressure is reasonably controlled.   DATE TEST EF   2/16    Echo   EF 65 % Mild AS Grad 11  10/18    Myoview   EF 70 % No ischemia  2/19 Echo  65-70% Mild AS grad 6    Date Cr Hgb  4/18 1.4 12.2   1/19 1.36 12.8  4/19 1.4 13.0   She went to see Dr. Ubaldo Glassing for second opinion.  She also saw pulmonary; restrictive lung disease attributed to obesity.  Saw Dr. Deidre Ala last week.  Note was reviewed.  It was noted that her heart rate went up to 98 bpm with minimal walking.      Past Medical History:  Diagnosis Date  . Abnormal echocardiogram 09/2009   EF >55%, mild LVH, moderate LAE, normal RV, normal PA systolic pressure  . Allergic to IV contrast   . Chronic diastolic CHF (congestive heart failure) ([place])   . Complete heart block-S./P. AV junction ablation 11/23/2010  . GERD (gastroesophageal reflux disease)   . Hyperlipidemia    Has been unable to take statins due to muscle pain. Has tried multiple statins per her report.  . Hypertension   . Hypothyroidism   . Migraine   . OSA (obstructive sleep apnea)    Not consistently using CPAP  . Pacemaker-Medtronic CRT P. 11/23/2010   Atrial port was plugged   . PAD (peripheral artery disease) ([facility])    Occlusive disease involving left PT and AT  . Paroxysmal atrial fibrillation ([facility])    Breakthrough a  fib on sotalol, changed to amiodarone 4/11. DCCV 4/11    Past Surgical History:  Procedure Laterality Date  . APPENDECTOMY    . ATRIAL ABLATION SURGERY  06/12/2010  . CARDIAC CATHETERIZATION     Left heart cath x3 in teh past, last 5 yrs ago. All "normal" per pt report  . CARDIOVERSION    . CARPAL TUNNEL RELEASE    . CHOLECYSTECTOMY OPEN    . COLONOSCOPY WITH PROPOFOL N/A 09/08/2015   Procedure: COLONOSCOPY WITH PROPOFOL;  Surgeon: Josefine Class, MD;  Location: [facility] ENDOSCOPY;  Service: Endoscopy;  Laterality: N/A;  . ESOPHAGOGASTRODUODENOSCOPY (EGD) WITH PROPOFOL N/A 09/08/2015   Procedure: ESOPHAGOGASTRODUODENOSCOPY (EGD) WITH PROPOFOL;  Surgeon: Josefine Class, MD;  Location: [facility] ENDOSCOPY;  Service: Endoscopy;  Laterality: N/A;  . PACEMAKER INSERTION  06/12/2010  . VESICOVAGINAL FISTULA CLOSURE W/ TAH      Current Outpatient Medications  Medication Sig Dispense Refill  . apixaban (ELIQUIS) 5 MG TABS tablet Take 1 tablet (5 mg total) by mouth 2 (two) times daily. 180 tablet 3  . famotidine (PEPCID) 10 MG tablet Take 10 mg by mouth daily as needed. Reported on 08/19/2015    . furosemide (LASIX) 20 MG tablet TAKE ONE TABLET BY MOUTH TWICE DAILY AS NEEDED 180 tablet  3  . GuaiFENesin (MUCINEX PO) Take by mouth as needed.    Marland Kitchen KLOR-CON M10 10 MEQ tablet TAKE ONE TABLET BY MOUTH ONCE DAILY 90 tablet 3  . levothyroxine (SYNTHROID, LEVOTHROID) 88 MCG tablet Take 88 mcg by mouth daily.    . meclizine (ANTIVERT) 25 MG tablet TAKE ONE TABLET BY MOUTH THREE TIMES DAILY AS NEEDED 90 tablet 1  . metoprolol tartrate (LOPRESSOR) 50 MG tablet TAKE 1 & 1/2 (ONE & ONE-HALF) TABLETS BY MOUTH TWICE DAILY 270 tablet 0  . nitroGLYCERIN (NITROSTAT) 0.4 MG SL tablet Place 1 tablet (0.4 mg total) under the tongue every 5 (five) minutes as needed for chest pain. 25 tablet 3  . prochlorperazine (COMPAZINE) 5 MG tablet Take 1 tablet (5 mg total) by mouth every 6 (six) hours as needed. 90 tablet 5  .  VITAMIN B1-B12 IM Inject into the muscle every 30 (thirty) days.     No current facility-administered medications for this visit.     Allergies  Allergen Reactions  . Iodinated Diagnostic Agents Shortness Of Breath and Other (See Comments)  . Other Shortness Of Breath, Nausea And Vomiting and Other (See Comments)    Contrast dye  . Codeine Other (See Comments)  . Codeine Sulfate Other (See Comments)  . Dipyridamole Other (See Comments)  . Iodine Other (See Comments)  . Morphine Other (See Comments)  . Oxycodone-Acetaminophen   . Oxycodone-Acetaminophen Other (See Comments)  . Shellfish-Derived Products     Other reaction(s): Unknown  . Statins Other (See Comments)    Other reaction(s): Unknown  . Sulfa Antibiotics Other (See Comments)    Other reaction(s): Unknown  . Sulfonamide Derivatives   . Amlodipine Rash and Other (See Comments)    Review of Systems negative except from HPI and PMH  Physical Exam BP 132/60 (BP Location: Right Arm, Patient Position: Sitting, Cuff Size: Normal)   Pulse 71   Ht 5\' 2"  (1.575 m)   Wt 222 lb (100.7 kg)   BMI 40.60 kg/m  Well developed and nourished in no acute distress HENT normal Neck supple with JVP-flat Clear Regular rate and rhythm, no murmurs or gallops Abd-soft with active BS No Clubbing cyanosis edema Skin-warm and dry A & Oriented  Grossly normal sensory and motor function Extremely sob w exercise and auditory wheezing   Assessment and  Plan  HFpEF  Atrial fibrillation-permanent  AV junction ablation-complete heart block  Pacemaker-CRT  The patient's device was interrogated.  The information was reviewed. Changes were made as noted below Aortic stenosis-mild  Hypertension   On Anticoagulation;  No bleeding issues   Multiple reprogramming's were undertaken to try to see if we could improve her dyspnea.  Some things made her more dizzy something may or less.  We spent more than 50% of our >25 min visit in face to  face counseling regarding the above

## 2018-03-14 NOTE — Telephone Encounter (Signed)
Returned patient call.  She left the office today after seeing Dr Caryl Comes and wanted to know if she should take extra Furosemide since there was fluid accumulation on the report today.  Advised to take 1 extra Furosemide tablet today and tomorrow. If she feels she is having any trouble with taking extra Furosemide then go back to prescribed dosage.  ICM remote transmission scheduled for 03/20/2018 to recheck fluid levels.

## 2018-03-14 NOTE — Patient Instructions (Signed)
Medication Instructions:  Your physician recommends that you continue on your current medications as directed. Please refer to the Current Medication list given to you today.  If you need a refill on your cardiac medications before your next appointment, please call your pharmacy.   Lab work: none If you have labs (blood work) drawn today and your tests are completely normal, you will receive your results only by: Marland Kitchen MyChart Message (if you have MyChart) OR . A paper copy in the mail If you have any lab test that is abnormal or we need to change your treatment, we will call you to review the results.  Testing/Procedures: Remote monitoring is used to monitor your Pacemaker of ICD from home. This monitoring reduces the number of office visits required to check your device to one time per year. It allows [place] to keep an eye on the functioning of your device to ensure it is working properly. You are scheduled for a device check from home on 04/14/18. You may send your transmission at any time that day. If you have a wireless device, the transmission will be sent automatically. After your physician reviews your transmission, you will receive a postcard with your next transmission date.    Follow-Up: At [facility], you and your health needs are our priority.  As part of our continuing mission to provide you with exceptional heart care, we have created designated Provider Care Teams.  These Care Teams include your primary Cardiologist (physician) and Advanced Practice Providers (APPs -  Physician Assistants and Nurse Practitioners) who all work together to provide you with the care you need, when you need it.   Your physician recommends that you schedule a follow-up appointment in: [place].

## 2018-03-15 LAB — CUP PACEART REMOTE DEVICE CHECK
Battery Remaining Longevity: 6 mo
Battery Voltage: 2.84 V
Brady Statistic AP VP Percent: 0 %
Brady Statistic AS VS Percent: 0.58 %
Implantable Lead Location: 753858
Implantable Pulse Generator Implant Date: 20120106
Lead Channel Impedance Value: 4047 Ohm
Lead Channel Impedance Value: 475 Ohm
Lead Channel Impedance Value: 646 Ohm
Lead Channel Impedance Value: 741 Ohm
Lead Channel Pacing Threshold Amplitude: 1.875 V
Lead Channel Pacing Threshold Pulse Width: 1 ms
Lead Channel Sensing Intrinsic Amplitude: 5.125 mV
Lead Channel Sensing Intrinsic Amplitude: 5.125 mV
MDC IDC LEAD IMPLANT DT: 20120106
MDC IDC LEAD IMPLANT DT: 20120106
MDC IDC LEAD LOCATION: 753860
MDC IDC MSMT LEADCHNL LV IMPEDANCE VALUE: 551 Ohm
MDC IDC MSMT LEADCHNL LV IMPEDANCE VALUE: 627 Ohm
MDC IDC MSMT LEADCHNL LV IMPEDANCE VALUE: 969 Ohm
MDC IDC MSMT LEADCHNL RA IMPEDANCE VALUE: 4047 Ohm
MDC IDC MSMT LEADCHNL RV IMPEDANCE VALUE: 418 Ohm
MDC IDC MSMT LEADCHNL RV PACING THRESHOLD AMPLITUDE: 0.75 V
MDC IDC MSMT LEADCHNL RV PACING THRESHOLD PULSEWIDTH: 0.4 ms
MDC IDC SESS DTM: 20190916105813
MDC IDC SET LEADCHNL RV PACING AMPLITUDE: 2.5 V
MDC IDC SET LEADCHNL RV PACING PULSEWIDTH: 0.4 ms
MDC IDC SET LEADCHNL RV SENSING SENSITIVITY: 0.9 mV
MDC IDC STAT BRADY AP VS PERCENT: 0 %
MDC IDC STAT BRADY AS VP PERCENT: 99.42 %
MDC IDC STAT BRADY RA PERCENT PACED: 0 %
MDC IDC STAT BRADY RV PERCENT PACED: 99.32 %

## 2018-03-20 ENCOUNTER — Ambulatory Visit (INDEPENDENT_AMBULATORY_CARE_PROVIDER_SITE_OTHER): Payer: Medicare Other

## 2018-03-20 DIAGNOSIS — I5032 Chronic diastolic (congestive) heart failure: Secondary | ICD-10-CM

## 2018-03-20 DIAGNOSIS — Z950 Presence of cardiac pacemaker: Secondary | ICD-10-CM

## 2018-03-20 NOTE — Progress Notes (Signed)
EPIC Encounter for ICM Monitoring  Patient Name: Jacqueline Lozano is a 83 y.o. female Date: 03/20/2018 Primary Care Physican: [name], MD Primary Cardiologist:[name] Electrophysiologist: [name] Weight:Does not weigh Bi-V Pacing: 99.4%       Heart Failure questions reviewed, pt reported no changes in how she feels, still has shortness of breath and some dizziness.   Thoracic impedance normal.   Prescribed: Furosemide 20 mg 1 tablet twice a day as needed. Patient takingdifferently -1 tablet 20 mg daily. Klor Con 10 mEq 1 tablet daily  Recommendations: No changes.   Encouraged to call for fluid symptoms.  Follow-up plan: ICM clinic phone appointment on 04/14/2018.    Copy of ICM check sent to Dr. Caryl Comes.   3 month ICM trend: 03/20/2018    1 Year ICM trend:       Rosalene Billings, RN 03/20/2018 5:25 PM

## 2018-04-04 ENCOUNTER — Ambulatory Visit: Payer: Medicare Other | Admitting: Physician Assistant

## 2018-04-14 ENCOUNTER — Ambulatory Visit (INDEPENDENT_AMBULATORY_CARE_PROVIDER_SITE_OTHER): Payer: Medicare Other

## 2018-04-14 DIAGNOSIS — I5032 Chronic diastolic (congestive) heart failure: Secondary | ICD-10-CM | POA: Diagnosis not present

## 2018-04-14 DIAGNOSIS — Z950 Presence of cardiac pacemaker: Secondary | ICD-10-CM | POA: Diagnosis not present

## 2018-04-17 NOTE — Progress Notes (Signed)
EPIC Encounter for ICM Monitoring  Patient Name: Jacqueline Lozano is a 83 y.o. female Date: 04/17/2018 Primary Care Physican: [name], MD Primary Cardiologist:[name] Electrophysiologist: Vergie Living Pacing: 99.4%  Today's Weight:  unknown  Battery Longevity: 2.83 ~5 months      Heart Failure questions reviewed, pt reported she has bronchitis and monitoring for pneumonia.   Thoracic impedance normal but was abnormal suggesting fluid accumulation from 03/23/2018 - 04/08/2018.   Prescribed: Furosemide 20 mg 1 tablet twice a day as needed. Patient takingdifferently -1 tablet 20 mg daily. Klor Con 10 mEq 1 tablet daily  Recommendations:  No changes.  Encouraged to call for fluid symptoms.  Follow-up plan: ICM clinic phone appointment on 05/22/2018.      Copy of ICM check sent to Dr. Caryl Comes.   3 month ICM trend: 04/17/2018    1 Year ICM trend:       Rosalene Billings, RN 04/17/2018 10:59 AM

## 2018-04-18 ENCOUNTER — Encounter: Payer: Medicare Other | Admitting: Internal Medicine

## 2018-05-22 ENCOUNTER — Ambulatory Visit (INDEPENDENT_AMBULATORY_CARE_PROVIDER_SITE_OTHER): Payer: Medicare Other

## 2018-05-22 ENCOUNTER — Other Ambulatory Visit: Payer: Self-pay | Admitting: Cardiovascular Disease

## 2018-05-22 DIAGNOSIS — I5032 Chronic diastolic (congestive) heart failure: Secondary | ICD-10-CM | POA: Diagnosis not present

## 2018-05-22 DIAGNOSIS — Z950 Presence of cardiac pacemaker: Secondary | ICD-10-CM | POA: Diagnosis not present

## 2018-05-22 DIAGNOSIS — I442 Atrioventricular block, complete: Secondary | ICD-10-CM

## 2018-05-22 NOTE — Progress Notes (Signed)
Remote pacemaker transmission.   

## 2018-05-23 ENCOUNTER — Encounter: Payer: Self-pay | Admitting: Cardiology

## 2018-05-23 NOTE — Progress Notes (Signed)
EPIC Encounter for ICM Monitoring  Patient Name: Jacqueline Lozano is a 83 y.o. female Date: 05/23/2018 Primary Care Physican: [name], MD Primary Cardiologist:[name] Electrophysiologist: Vergie Living Pacing: 99.8%    Today's Weight:  unknown  Battery Longevity: 2.82 ~5 months                                                          Transmission reviewed.   Thoracic impedance normal.  Prescribed: Furosemide 20 mg 1 tablet twice a day as needed. Patient takingdifferently -1 tablet 20 mg daily. Klor Con 10 mEq 1 tablet daily  Recommendations:  None  Follow-up plan: ICM clinic phone appointment on 06/26/2018.      Copy of ICM check sent to Dr. Caryl Comes.   3 month ICM trend: 05/22/2018    1 Year ICM trend:       Rosalene Billings, RN 05/23/2018 2:17 PM

## 2018-05-29 ENCOUNTER — Other Ambulatory Visit: Payer: Self-pay | Admitting: Cardiovascular Disease

## 2018-05-29 NOTE — Progress Notes (Signed)
Patient returned and stated she does have a little swelling of the feet but improves when she is sleeping.  She feeling better but did have a difficult time getting rid of pneumonia. No changes today and encouraged to call if feet swelling get worse.  Next remote transmission 06/26/2018

## 2018-06-23 ENCOUNTER — Other Ambulatory Visit: Payer: Self-pay | Admitting: Cardiovascular Disease

## 2018-06-23 NOTE — Telephone Encounter (Signed)
Refill Request.  

## 2018-06-26 ENCOUNTER — Ambulatory Visit (INDEPENDENT_AMBULATORY_CARE_PROVIDER_SITE_OTHER): Payer: Medicare Other

## 2018-06-26 DIAGNOSIS — I5032 Chronic diastolic (congestive) heart failure: Secondary | ICD-10-CM

## 2018-06-26 DIAGNOSIS — Z950 Presence of cardiac pacemaker: Secondary | ICD-10-CM

## 2018-06-27 NOTE — Progress Notes (Signed)
EPIC Encounter for ICM Monitoring  Patient Name: Jacqueline Lozano is a 84 y.o. female Date: 06/27/2018 Primary Care Physican: [name], MD Primary Cardiologist:[name] Electrophysiologist: Vergie Living Pacing: 99.8% Today's Weight: unknown  Battery Longevity: 2.82 ~4 months  Heart failure questions reviewed.  She stated she has some swelling in legs.    Thoracic impedance starting to trend below baseline normal.  Prescribed:Furosemide 20 mg 1 tablet twice a day as needed. Patient takingdifferently -1 tablet 20 mg daily. Klor Con 10 mEq 1 tablet daily  Recommendations:Advised to to take Furosemide 20 mg 1 tablet bid x 2 days.   Follow-up plan: ICM clinic phone appointment on2/24/2020.    Copy of ICM check sent to [place].  3 month ICM trend: 06/26/2018    1 Year ICM trend:       Rosalene Billings, RN 06/27/2018 2:51 PM

## 2018-07-02 LAB — CUP PACEART REMOTE DEVICE CHECK
Brady Statistic AP VP Percent: 0 %
Brady Statistic AP VS Percent: 0 %
Brady Statistic AS VP Percent: 99.85 %
Brady Statistic AS VS Percent: 0.15 %
Brady Statistic RV Percent Paced: 99.8 %
Date Time Interrogation Session: 20191216142742
Implantable Lead Implant Date: 20120106
Implantable Lead Implant Date: 20120106
Lead Channel Impedance Value: 4047 Ohm
Lead Channel Impedance Value: 418 Ohm
Lead Channel Impedance Value: 475 Ohm
Lead Channel Impedance Value: 570 Ohm
Lead Channel Impedance Value: 646 Ohm
Lead Channel Impedance Value: 665 Ohm
Lead Channel Impedance Value: 741 Ohm
Lead Channel Pacing Threshold Amplitude: 1.875 V
Lead Channel Pacing Threshold Pulse Width: 0.4 ms
Lead Channel Pacing Threshold Pulse Width: 1 ms
Lead Channel Sensing Intrinsic Amplitude: 5.125 mV
Lead Channel Setting Pacing Pulse Width: 0.4 ms
MDC IDC LEAD LOCATION: 753858
MDC IDC LEAD LOCATION: 753860
MDC IDC MSMT BATTERY REMAINING LONGEVITY: 5 mo
MDC IDC MSMT BATTERY VOLTAGE: 2.82 V
MDC IDC MSMT LEADCHNL LV IMPEDANCE VALUE: 988 Ohm
MDC IDC MSMT LEADCHNL RA IMPEDANCE VALUE: 4047 Ohm
MDC IDC MSMT LEADCHNL RV PACING THRESHOLD AMPLITUDE: 0.75 V
MDC IDC MSMT LEADCHNL RV SENSING INTR AMPL: 5.125 mV
MDC IDC PG IMPLANT DT: 20120106
MDC IDC SET LEADCHNL RV PACING AMPLITUDE: 2.5 V
MDC IDC SET LEADCHNL RV SENSING SENSITIVITY: 0.9 mV
MDC IDC STAT BRADY RA PERCENT PACED: 0 %

## 2018-07-31 ENCOUNTER — Ambulatory Visit (INDEPENDENT_AMBULATORY_CARE_PROVIDER_SITE_OTHER): Payer: Medicare Other

## 2018-07-31 ENCOUNTER — Telehealth: Payer: Self-pay

## 2018-07-31 DIAGNOSIS — I5032 Chronic diastolic (congestive) heart failure: Secondary | ICD-10-CM | POA: Diagnosis not present

## 2018-07-31 DIAGNOSIS — Z950 Presence of cardiac pacemaker: Secondary | ICD-10-CM | POA: Diagnosis not present

## 2018-07-31 NOTE — Progress Notes (Signed)
EPIC Encounter for ICM Monitoring  Patient Name: ANNI HOCEVAR is a 84 y.o. female Date: 07/31/2018 Primary Care Physican: [name], MD Primary Cardiologist:[name] Electrophysiologist: Vergie Living Pacing: 99.7% Today's Weight: unknown  Battery Longevity: 2.81~4 months (<1-7 months)  Attempted call to patient and unable to reach.  Left detailed message per DPR regarding transmission. Transmission reviewed.    Thoracic impedance normal.  Prescribed:Furosemide 20 mg 1 tablet twice a day as needed. Patient takingdifferently -1 tablet 20 mg daily. Klor Con 10 mEq 1 tablet daily  Recommendations:Left voice mail with ICM number and encouraged to call if experiencing any fluid symptoms.  Follow-up plan: ICM clinic phone appointment on3/30/2020.    Copy of ICM check sent to [place].   3 month ICM trend: 07/31/2018    1 Year ICM trend:       Rosalene Billings, RN 07/31/2018 10:27 AM

## 2018-07-31 NOTE — Telephone Encounter (Signed)
Remote ICM transmission received.  Attempted call to patient regarding ICM remote transmission and left detailed message, per DPR, with next ICM remote transmission date of 09/04/2018.  Advised to return call for any fluid symptoms or questions.   

## 2018-08-10 ENCOUNTER — Other Ambulatory Visit: Payer: Self-pay | Admitting: Cardiovascular Disease

## 2018-08-21 ENCOUNTER — Other Ambulatory Visit: Payer: Self-pay

## 2018-08-21 ENCOUNTER — Ambulatory Visit (INDEPENDENT_AMBULATORY_CARE_PROVIDER_SITE_OTHER): Payer: Medicare Other | Admitting: *Deleted

## 2018-08-21 DIAGNOSIS — I442 Atrioventricular block, complete: Secondary | ICD-10-CM

## 2018-08-22 LAB — CUP PACEART REMOTE DEVICE CHECK
Brady Statistic AP VP Percent: 0 %
Brady Statistic AP VS Percent: 0 %
Brady Statistic AS VP Percent: 100 %
Brady Statistic RA Percent Paced: 0 %
Brady Statistic RV Percent Paced: 99.79 %
Date Time Interrogation Session: 20200316125149
Implantable Lead Implant Date: 20120106
Implantable Lead Implant Date: 20120106
Implantable Lead Location: 753858
Lead Channel Impedance Value: 1178 Ohm
Lead Channel Impedance Value: 4047 Ohm
Lead Channel Impedance Value: 494 Ohm
Lead Channel Impedance Value: 608 Ohm
Lead Channel Impedance Value: 703 Ohm
Lead Channel Impedance Value: 798 Ohm
Lead Channel Sensing Intrinsic Amplitude: 5.125 mV
Lead Channel Setting Pacing Amplitude: 2.5 V
MDC IDC LEAD LOCATION: 753860
MDC IDC MSMT BATTERY REMAINING LONGEVITY: 4 mo
MDC IDC MSMT BATTERY VOLTAGE: 2.8 V
MDC IDC MSMT LEADCHNL LV IMPEDANCE VALUE: 893 Ohm
MDC IDC MSMT LEADCHNL LV PACING THRESHOLD AMPLITUDE: 1.875 V
MDC IDC MSMT LEADCHNL LV PACING THRESHOLD PULSEWIDTH: 1 ms
MDC IDC MSMT LEADCHNL RA IMPEDANCE VALUE: 4047 Ohm
MDC IDC MSMT LEADCHNL RV IMPEDANCE VALUE: 456 Ohm
MDC IDC MSMT LEADCHNL RV PACING THRESHOLD AMPLITUDE: 0.625 V
MDC IDC MSMT LEADCHNL RV PACING THRESHOLD PULSEWIDTH: 0.4 ms
MDC IDC MSMT LEADCHNL RV SENSING INTR AMPL: 5.125 mV
MDC IDC PG IMPLANT DT: 20120106
MDC IDC SET LEADCHNL RV PACING PULSEWIDTH: 0.4 ms
MDC IDC SET LEADCHNL RV SENSING SENSITIVITY: 0.9 mV
MDC IDC STAT BRADY AS VS PERCENT: 0 %

## 2018-08-24 ENCOUNTER — Telehealth: Payer: Self-pay | Admitting: Cardiology

## 2018-08-24 NOTE — Telephone Encounter (Signed)
LMOVM for pt to return call. Need to inform pt of monthly battery checks.

## 2018-08-25 NOTE — Telephone Encounter (Signed)
2nd attempt   LMOVM for pt to return call regarding nearing ERI.

## 2018-08-29 ENCOUNTER — Encounter: Payer: Self-pay | Admitting: Cardiology

## 2018-08-29 NOTE — Telephone Encounter (Signed)
3rd attempt  LMOVM for pt to return call in regards to her device nearing ERI.

## 2018-08-29 NOTE — Progress Notes (Signed)
Remote pacemaker transmission.   

## 2018-09-01 NOTE — Telephone Encounter (Signed)
Spoke with Judeen Hammans, patient's daughter (DPR). Advised ~4 months until ERI, plan for monthly battery checks (will tag on to monthly ICM checks). Explained we will schedule pt for appointment with Dr. Caryl Comes once ERI reached, then his nurse will schedule gen change procedure. Next transmission scheduled for 09/04/18. Sherry verbalizes understanding, reports she will update patient. She denies additional questions or concerns at this time.

## 2018-09-04 ENCOUNTER — Other Ambulatory Visit: Payer: Self-pay

## 2018-09-04 ENCOUNTER — Ambulatory Visit (INDEPENDENT_AMBULATORY_CARE_PROVIDER_SITE_OTHER): Payer: Medicare Other

## 2018-09-04 DIAGNOSIS — Z950 Presence of cardiac pacemaker: Secondary | ICD-10-CM

## 2018-09-04 DIAGNOSIS — I5032 Chronic diastolic (congestive) heart failure: Secondary | ICD-10-CM | POA: Diagnosis not present

## 2018-09-05 ENCOUNTER — Telehealth: Payer: Self-pay

## 2018-09-05 NOTE — Progress Notes (Signed)
EPIC Encounter for ICM Monitoring  Patient Name: MUSKAAN SMET is a 84 y.o. female Date: 09/05/2018 Primary Care Physican: [name], MD Primary Cardiologist:[name] Electrophysiologist: Vergie Living Pacing: 99.8% Today's Weight: unknown  Battery Longevity: 2.80~66months (<1-7 months)  Attempted call to patient and unable to reach.  Left detailed message per DPR regarding transmission. Transmission reviewed.   Thoracic impedancenormal.  Prescribed:Furosemide 20 mg 1 tablet twice a day as needed. Patient takingdifferently -1 tablet 20 mg daily. Klor Con 10 mEq 1 tablet daily  Recommendations:Left voice mail with ICM number and encouraged to call if experiencing any fluid symptoms.  Follow-up plan: ICM clinic phone appointment on 10/09/2018.   Copy of ICM check sent to [place].   3 month ICM trend: 09/04/2018    1 Year ICM trend:       Rosalene Billings, RN 09/05/2018 10:23 AM

## 2018-09-05 NOTE — Telephone Encounter (Signed)
Remote ICM transmission received.  Attempted call to patient regarding ICM remote transmission and left detailed message, per DPR, with next ICM remote transmission date of 10/09/2018.  Advised to return call for any fluid symptoms or questions.    

## 2018-09-08 ENCOUNTER — Telehealth: Payer: Self-pay | Admitting: Cardiovascular Disease

## 2018-09-08 NOTE — Telephone Encounter (Signed)
Virtual Visit Pre-Appointment Phone Call  Steps For Call:  1. Confirm consent - "In the setting of the current Covid19 crisis, you are scheduled for a (phone or video) visit with your provider on (date) at (time).  Just as we do with many in-office visits, in order for you to participate in this visit, we must obtain consent.  If you'd like, I can send this to your mychart (if signed up) or email for you to review.  Otherwise, I can obtain your verbal consent now.  All virtual visits are billed to your insurance company just like a normal visit would be.  By agreeing to a virtual visit, we'd like you to understand that the technology does not allow for your provider to perform an examination, and thus may limit your provider's ability to fully assess your condition.  Finally, though the technology is pretty good, we cannot assure that it will always work on either your or our end, and in the setting of a video visit, we may have to convert it to a phone-only visit.  In either situation, we cannot ensure that we have a secure connection.  Are you willing to proceed?"  2. Give patient instructions for WebEx download to smartphone as below if video visit  3. Advise patient to be prepared with any vital sign or heart rhythm information, their current medicines, and a piece of paper and pen handy for any instructions they may receive the day of their visit  4. Inform patient they will receive a phone call 15 minutes prior to their appointment time (may be from unknown caller ID) so they should be prepared to answer  5. Confirm that appointment type is correct in Epic appointment notes (video vs telephone)    TELEPHONE CALL NOTE  [place] has been deemed a candidate for a follow-up tele-health visit to limit community exposure during the Covid-19 pandemic. I spoke with the patient via phone to ensure availability of phone/video source, confirm preferred email & phone number, and discuss  instructions and expectations.  I reminded Jacqueline Lozano to be prepared with any vital sign and/or heart rhythm information that could potentially be obtained via home monitoring, at the time of her visit. I reminded Jacqueline Lozano to expect a phone call at the time of her visit if her visit.  Did the patient verbally acknowledge consent to treatment? YES  Clarisse Gouge 09/08/2018 4:42 PM   DOWNLOADING THE [place], go to [employer] and type in WebEx in the search bar. [place] [employer], the blue/green circle. The app is free but as with any other app downloads, their phone may require them to verify saved payment information or Apple password. The patient does NOT have to create an account.  - If Android, ask patient to go to [employer] and type in WebEx in the search bar. [place] [employer], the blue/green circle. The app is free but as with any other app downloads, their phone may require them to verify saved payment information or Android password. The patient does NOT have to create an account.   CONSENT FOR TELE-HEALTH VISIT - PLEASE REVIEW  I hereby voluntarily request, consent and authorize [facility] and its employed or contracted physicians, physician assistants, nurse practitioners or other licensed health care professionals (the Practitioner), to provide me with telemedicine health care services (the Services") as deemed necessary by the treating Practitioner. I  acknowledge and consent to receive the Services by the Practitioner via telemedicine. I understand that the telemedicine visit will involve communicating with the Practitioner through live audiovisual communication technology and the disclosure of certain medical information by electronic transmission. I acknowledge that I have been given the opportunity to request an in-person assessment or other available alternative prior to the telemedicine visit and  am voluntarily participating in the telemedicine visit.  I understand that I have the right to withhold or withdraw my consent to the use of telemedicine in the course of my care at any time, without affecting my right to future care or treatment, and that the Practitioner or I may terminate the telemedicine visit at any time. I understand that I have the right to inspect all information obtained and/or recorded in the course of the telemedicine visit and may receive copies of available information for a reasonable fee.  I understand that some of the potential risks of receiving the Services via telemedicine include:   Delay or interruption in medical evaluation due to technological equipment failure or disruption;  Information transmitted may not be sufficient (e.g. poor resolution of images) to allow for appropriate medical decision making by the Practitioner; and/or   In rare instances, security protocols could fail, causing a breach of personal health information.  Furthermore, I acknowledge that it is my responsibility to provide information about my medical history, conditions and care that is complete and accurate to the best of my ability. I acknowledge that Practitioner's advice, recommendations, and/or decision may be based on factors not within their control, such as incomplete or inaccurate data provided by me or distortions of diagnostic images or specimens that may result from electronic transmissions. I understand that the practice of medicine is not an exact science and that Practitioner makes no warranties or guarantees regarding treatment outcomes. I acknowledge that I will receive a copy of this consent concurrently upon execution via email to the email address I last provided but may also request a printed copy by calling the office of [facility].    I understand that my insurance will be billed for this visit.   I have read or had this consent read to me.  I understand the  contents of this consent, which adequately explains the benefits and risks of the Services being provided via telemedicine.   I have been provided ample opportunity to ask questions regarding this consent and the Services and have had my questions answered to my satisfaction.  I give my informed consent for the services to be provided through the use of telemedicine in my medical care  By participating in this telemedicine visit I agree to the above.

## 2018-09-13 ENCOUNTER — Other Ambulatory Visit: Payer: Self-pay | Admitting: Cardiovascular Disease

## 2018-09-13 NOTE — Telephone Encounter (Signed)
°*  STAT* If patient is at the pharmacy, call can be transferred to refill team.   1. Which medications need to be refilled? (please list name of each medication and dose if known) furosemide (LASIX) 20 MG   2. Which pharmacy/location (including street and city if local pharmacy) is medication to be sent to? [facility] on [address]   3. Do they need a 30 day or 90 day supply? 90 day

## 2018-09-18 NOTE — Progress Notes (Addendum)
Virtual Visit via Telephone Note   This visit type was conducted due to national recommendations for restrictions regarding the COVID-19 Pandemic (e.g. social distancing) in an effort to limit this patient's exposure and mitigate transmission in our community.  Due to her co-morbid illnesses, this patient is at least at moderate risk for complications without adequate follow up.  This format is felt to be most appropriate for this patient at this time.  The patient did not have access to video technology/had technical difficulties with video requiring transitioning to audio format only (telephone).  All issues noted in this document were discussed and addressed.  No physical exam could be performed with this format.  Please refer to the patient's chart for her  consent to telehealth for [facility].   Date:  09/19/2018   ID:  Jacqueline Lozano, DOB Sep 23, 1934, MRN [id number]  Patient Location:  [address] [place] [address]   Provider location:   [facility], [place] office  PCP:  [name], MD  Cardiologist:  Patsy Baltimore  Chief Complaint:  SOB  History of Present Illness:    Jacqueline Lozano is a 84 y.o. female who presents via audio/video conferencing for a telehealth visit today.   The patient does not symptoms concerning for COVID-19 infection (fever, chills, cough, or new SHORTNESS OF BREATH).   Patient has a past medical history of Chronic shortness of breath morbid obesity, deconditioning Chronic bilateral knee pain who has declined joint surgery Permanent atrial fibrillation,  status post arteriovenous node ablation, Pacer/CRT-P, ventricularly paced Hypertension,  Hypothyroidism,  Diastolic congestive heart failure,  C. difficile infection,  Echo 07/2014: mild AS Carotid: plaque on the right in 2014 who presents for followup of her chronic shortness of breath, pacemaker, atrial fibrillation   Since her last clinic visit had  bronchitis October and November 2019 treated with antibiotics and prednisone  Thoracic impedancenormal on recent device check September 04, 2018 Taking Lasix 20 daily with potassium 10 daily Pacer download data reviewed personally by myself Had elevated optivol beginning of January, with improvement shortly after Recurrence of elevated optivol mid-January again with improvement  Daughter takes care of her,  Lab work reviewed creatinine 1.4 BUN 18  Long discussion concerning her high cholesterol Lab work in April 2019 Total cholesterol 245 LDL 150 Tried a statin: does not want to retry Does not think that she has tried Zetia  Chronic shortness of breath, symptoms on exertion not at rest No leg swelling PND orthopnea Uses a walker and gives out very quickly  Rate response previously tested in the office, was appropriate  hit 98 bpm walking down the hall, no drop in oxygen saturation, maintained for the 95%. She was very short of breath, had difficulty walking back to the room, had to walk very slowly, take breaks  Seen by Dr. Caryl Comes November 2018 No pacemaker changes made  Previously seen by pulmonary PFT's shows restrictive lung disease likely from her obseity  Unable to exercise Unable to participate in pulmonary rehabilitation given severe chronic knee pain We have recommended water aerobics, she has not participated  Previously on symbicort,  Stopped, expensive Did not seem to make much of a difference  Echocardiogram February 2019 Ejection fraction 60 to 65%, mild aortic valve stenosis,   Seen by Dr. Ubaldo Glassing Stress test 03/2017: normal study   Prior CV studies:   The following studies were reviewed today:  Previous echocardiogram 2016 showed mild to moderate aortic valve  stenosis, normal EF, normal right heart pressures  echocardiogram shows normal LV function, stable valve disease, normal right ventricular systolic pressures.  Previous visit to the emergency  room for atypical chest pain. Cardiac enzymes were negative and she was discharged home Prior workup with echocardiogram in 2014 and stress test were unrevealing.  sleep study in October and again in November. Sleep study in October suggested she repeat the test as it was incomplete. The note indicates the study was discontinued as the patient had heart block, with limited EKG monitoring appearing to demonstrate a 43 second period of complete heart block with ventricular standstill. Atrial rate was 80 beats per minute. Note indicates the patient was awake and alert during the event with some dizziness. Pacer was checked after this event, Normal functioning device.  Followup sleep study 04/24/2013 recommended weight loss, though there was no indications for CPAP at this time. Previous stress test early 2014 showed no ischemia    Past Medical History:  Diagnosis Date  . Abnormal echocardiogram 09/2009   EF >55%, mild LVH, moderate LAE, normal RV, normal PA systolic pressure  . Allergic to IV contrast   . Chronic diastolic CHF (congestive heart failure) ([place])   . Complete heart block-S./P. AV junction ablation 11/23/2010  . GERD (gastroesophageal reflux disease)   . Hyperlipidemia    Has been unable to take statins due to muscle pain. Has tried multiple statins per her report.  . Hypertension   . Hypothyroidism   . Migraine   . OSA (obstructive sleep apnea)    Not consistently using CPAP  . Pacemaker-Medtronic CRT P. 11/23/2010   Atrial port was plugged   . PAD (peripheral artery disease) (HCC)    Occlusive disease involving left PT and AT  . Paroxysmal atrial fibrillation (HCC)    Breakthrough a fib on sotalol, changed to amiodarone 4/11. DCCV 4/11   Past Surgical History:  Procedure Laterality Date  . APPENDECTOMY    . ATRIAL ABLATION SURGERY  06/12/2010  . CARDIAC CATHETERIZATION     Left heart cath x3 in teh past, last 5 yrs ago. All "normal" per pt report  . CARDIOVERSION     . CARPAL TUNNEL RELEASE    . CHOLECYSTECTOMY OPEN    . COLONOSCOPY WITH PROPOFOL N/A 09/08/2015   Procedure: COLONOSCOPY WITH PROPOFOL;  Surgeon: Josefine Class, MD;  Location[facility] ENDOSCOPY;  Service: Endoscopy;  Laterality: N/A;  . ESOPHAGOGASTRODUODENOSCOPY (EGD) WITH PROPOFOL N/A 09/08/2015   Procedure: ESOPHAGOGASTRODUODENOSCOPY (EGD) WITH PROPOFOL;  Surgeon: Josefine Class, MD;  Location[facility] ENDOSCOPY;  Service: Endoscopy;  Laterality: N/A;  . PACEMAKER INSERTION  06/12/2010  . VESICOVAGINAL FISTULA CLOSURE W/ TAH       Current Meds  Medication Sig  . apixaban (ELIQUIS) 5 MG TABS tablet Take 1 tablet (5 mg total) by mouth 2 (two) times daily.  . famotidine (PEPCID) 10 MG tablet Take 10 mg by mouth daily as needed. Reported on 08/19/2015  . furosemide (LASIX) 20 MG tablet Take 1 tablet by mouth twice daily as needed  . GuaiFENesin (MUCINEX PO) Take by mouth as needed.  Marland Kitchen KLOR-CON M10 10 MEQ tablet Take 1 tablet by mouth once daily  . levothyroxine (SYNTHROID, LEVOTHROID) 88 MCG tablet Take 88 mcg by mouth daily.  . meclizine (ANTIVERT) 25 MG tablet TAKE ONE TABLET BY MOUTH THREE TIMES DAILY AS NEEDED  . metoprolol tartrate (LOPRESSOR) 50 MG tablet TAKE 1 & 1/2 (ONE & ONE-HALF) TABLETS BY MOUTH TWICE DAILY  .  nitroGLYCERIN (NITROSTAT) 0.4 MG SL tablet Place 1 tablet (0.4 mg total) under the tongue every 5 (five) minutes as needed for chest pain.  Marland Kitchen prochlorperazine (COMPAZINE) 5 MG tablet Take 1 tablet (5 mg total) by mouth every 6 (six) hours as needed.  Marland Kitchen VITAMIN B1-B12 IM Inject into the muscle every 30 (thirty) days.     Allergies:   Iodinated diagnostic agents; Other; Codeine; Codeine sulfate; Dipyridamole; Iodine; Morphine; Oxycodone-acetaminophen; Oxycodone-acetaminophen; Shellfish-derived products; Statins; Sulfa antibiotics; Sulfonamide derivatives; and Amlodipine   Social History   Tobacco Use  . Smoking status: Never Smoker  . Smokeless tobacco: Never Used  .  Tobacco comment: pos smoke exposure through father and spouse who smoked in the home with her for many years  Substance Use Topics  . Alcohol use: No  . Drug use: No     Current Outpatient Medications on File Prior to Visit  Medication Sig Dispense Refill  . apixaban (ELIQUIS) 5 MG TABS tablet Take 1 tablet (5 mg total) by mouth 2 (two) times daily. 180 tablet 1  . famotidine (PEPCID) 10 MG tablet Take 10 mg by mouth daily as needed. Reported on 08/19/2015    . furosemide (LASIX) 20 MG tablet Take 1 tablet by mouth twice daily as needed 180 tablet 0  . GuaiFENesin (MUCINEX PO) Take by mouth as needed.    Marland Kitchen KLOR-CON M10 10 MEQ tablet Take 1 tablet by mouth once daily 90 tablet 2  . levothyroxine (SYNTHROID, LEVOTHROID) 88 MCG tablet Take 88 mcg by mouth daily.    . meclizine (ANTIVERT) 25 MG tablet TAKE ONE TABLET BY MOUTH THREE TIMES DAILY AS NEEDED 90 tablet 1  . metoprolol tartrate (LOPRESSOR) 50 MG tablet TAKE 1 & 1/2 (ONE & ONE-HALF) TABLETS BY MOUTH TWICE DAILY 270 tablet 1  . nitroGLYCERIN (NITROSTAT) 0.4 MG SL tablet Place 1 tablet (0.4 mg total) under the tongue every 5 (five) minutes as needed for chest pain. 25 tablet 3  . prochlorperazine (COMPAZINE) 5 MG tablet Take 1 tablet (5 mg total) by mouth every 6 (six) hours as needed. 90 tablet 5  . VITAMIN B1-B12 IM Inject into the muscle every 30 (thirty) days.     No current facility-administered medications on file prior to visit.      Family Hx: The patient's family history includes Colon cancer in her daughter; Heart attack in her father and mother. There is no history of Stroke.  ROS:   Please see the history of present illness.    Review of Systems  Constitutional: Negative.   Respiratory: Positive for shortness of breath.   Cardiovascular: Negative.   Gastrointestinal: Negative.   Musculoskeletal: Negative.   Neurological: Negative.   Psychiatric/Behavioral: Negative.   All other systems reviewed and are negative.     Labs/Other Tests and Data Reviewed:    Recent Labs: No results found for requested labs within last 8760 hours.   Recent Lipid Panel Lab Results  Component Value Date/Time   CHOL 294 (H) 09/30/2009 09:19 PM   TRIG 459 (H) 09/30/2009 09:19 PM   HDL 44 09/30/2009 09:19 PM   CHOLHDL 6.7 Ratio 09/30/2009 09:19 PM   LDLCALC See Comment mg/dL 09/30/2009 09:19 PM   LDLDIRECT 146 (H) 10/01/2009 08:51 AM    Wt Readings from Last 3 Encounters:  03/14/18 222 lb (100.7 kg)  03/10/18 225 lb 8 oz (102.3 kg)  07/04/17 222 lb (100.7 kg)     Exam:    Vital Signs: Vital signs may  also be detailed in the HPI There were no vitals taken for this visit.  Wt Readings from Last 3 Encounters:  03/14/18 222 lb (100.7 kg)  03/10/18 225 lb 8 oz (102.3 kg)  07/04/17 222 lb (100.7 kg)   Temp Readings from Last 3 Encounters:  09/08/15 (!) 96.2 F (35.7 C) (Tympanic)  05/03/11 97.6 F (36.4 C) (Oral)   BP Readings from Last 3 Encounters:  03/14/18 132/60  03/10/18 (!) 148/84  07/04/17 140/85   Pulse Readings from Last 3 Encounters:  03/14/18 71  03/10/18 72  07/04/17 71    Blood perssure :134/76 Pulse: 64 resp 16  Well nourished, well developed female in no acute distress. Constitutional:  oriented to person, place, and time. No distress.    ASSESSMENT & PLAN:    Atrial fibrillation, unspecified type ([place]) - Plan: EKG 12-Lead On anticoagulation, Prior AV node ablation  Shortness of breath Chronic issue Echocardiogram and stress test without significant changes with normal EF On today's discussion doing well at home Encouraged a exercise program and weight loss for conditioning  Pacemaker-Medtronic CRT P. optivol numbers reviewed with her, down at baseline No suggestion of extra fluid on board She will continue to take Lasix daily, extra Lasix as needed for shortness of breath weight gain edema  Chronic diastolic CHF (congestive heart failure) (HCC) optivol is low, should  not be contributing to her shortness of breath symptoms Numbers reviewed with her on today's visit  Complete heart block-S./P. AV junction ablation pacemaker , ventricularly paced She is concerned about low battery  Hyperlipidemia Does not want a statin given prior side effects She is willing to try Zetia 10 mg daily    COVID-19 Education: The signs and symptoms of COVID-19 were discussed with the patient and how to seek care for testing (follow up with PCP or arrange E-visit).  The importance of social distancing was discussed today.  Patient Risk:   After full review of this patients clinical status, I feel that they are at least moderate risk at this time.  Time:   Today, I have spent 25 minutes with the patient with telehealth technology discussing the cardiac and medical problems/diagnoses detailed above     Medication Adjustments/Labs and Tests Ordered: Current medicines are reviewed at length with the patient today.  Concerns regarding medicines are outlined above.   Tests Ordered: No tests ordered   Medication Changes: No changes made   Disposition: Follow-up in 12 months, alternate with Dr. Caryl Comes every 6 months  Signed, Ida Rogue, MD  09/19/2018 1:47 PM    [place] Office [address], [place], [place] [address]

## 2018-09-19 ENCOUNTER — Other Ambulatory Visit: Payer: Self-pay

## 2018-09-19 ENCOUNTER — Telehealth (INDEPENDENT_AMBULATORY_CARE_PROVIDER_SITE_OTHER): Payer: Medicare Other | Admitting: Cardiovascular Disease

## 2018-09-19 DIAGNOSIS — I5032 Chronic diastolic (congestive) heart failure: Secondary | ICD-10-CM

## 2018-09-19 DIAGNOSIS — I442 Atrioventricular block, complete: Secondary | ICD-10-CM | POA: Diagnosis not present

## 2018-09-19 DIAGNOSIS — R0602 Shortness of breath: Secondary | ICD-10-CM

## 2018-09-19 DIAGNOSIS — I4821 Permanent atrial fibrillation: Secondary | ICD-10-CM | POA: Diagnosis not present

## 2018-09-19 DIAGNOSIS — Z950 Presence of cardiac pacemaker: Secondary | ICD-10-CM

## 2018-09-19 DIAGNOSIS — I10 Essential (primary) hypertension: Secondary | ICD-10-CM

## 2018-09-19 DIAGNOSIS — E782 Mixed hyperlipidemia: Secondary | ICD-10-CM

## 2018-09-19 MED ORDER — FUROSEMIDE 20 MG PO TABS: 20.0000 mg | ORAL_TABLET | ORAL | 3 refills | Status: DC

## 2018-09-19 MED ORDER — EZETIMIBE 10 MG PO TABS: 10.0000 mg | ORAL_TABLET | Freq: Every day | ORAL | 3 refills | Status: DC

## 2018-09-19 NOTE — Patient Instructions (Addendum)
Medication Instructions:  Your physician has recommended you make the following change in your medication:  1. START Zetia (Ezetimibe) 10 mg once a day for cholesterol 2. TAKE Furosemide (Lasix) 20 mg daily with extra dose after lunch for leg swelling.   If you need a refill on your cardiac medications before your next appointment, please call your pharmacy.   Lab work: Your physician recommends that you return for lab work in: 3 months for Lipid panel to be done. Make sure not to eat or drink anything after midnight prior except water with your pills. Go to the [facility] Entrance of the hospital and check in at the front desk to have that done. We will call you with those results a few days later. I will print and mail you the lab slip to take with you on that day. You do not need an appointment just go when is best for you between 8-5. Please call if you have any questions about this.   If you have labs (blood work) drawn today and your tests are completely normal, you will receive your results only by: Marland Kitchen MyChart Message (if you have MyChart) OR . A paper copy in the mail If you have any lab test that is abnormal or we need to change your treatment, we will call you to review the results.   Testing/Procedures: No new testing needed   Follow-Up: At [facility], you and your health needs are our priority.  As part of our continuing mission to provide you with exceptional heart care, we have created designated Provider Care Teams.  These Care Teams include your primary Cardiologist (physician) and Advanced Practice Providers (APPs -  Physician Assistants and Nurse Practitioners) who all work together to provide you with the care you need, when you need it.  . You will need a follow up appointment in 12 months , alternate every 6 months with Dr. Caryl Comes .   Please call our office 2 months in advance to schedule this appointment.    . Providers on your designated Care Team:    . Murray Hodgkins, NP . Christell Faith, PA-C . Marrianne Mood, PA-C  Any Other Special Instructions Will Be Listed Below (If Applicable).  For educational health videos Log in to : [email or web address] Or : [email or web address], password : [email or web address]

## 2018-09-20 ENCOUNTER — Telehealth: Payer: Self-pay | Admitting: Cardiovascular Disease

## 2018-09-20 NOTE — Telephone Encounter (Signed)
Patient calling  Just wanted to make office aware that all medications should be sent to [facility] on [address] unless otherwise stated States that some medications are being sent to [facility] in [place] and patient no longer uses  Has already picked up medication

## 2018-09-20 NOTE — Telephone Encounter (Signed)
Took out all pharmacies except [facility] on [address]. To ensure medication does not get sent to the wrong pharmacy.  Patient will specify if she needs it to go somewhere other than [facility].

## 2018-10-09 ENCOUNTER — Other Ambulatory Visit: Payer: Self-pay

## 2018-10-09 ENCOUNTER — Ambulatory Visit (INDEPENDENT_AMBULATORY_CARE_PROVIDER_SITE_OTHER): Payer: Medicare Other

## 2018-10-09 DIAGNOSIS — Z950 Presence of cardiac pacemaker: Secondary | ICD-10-CM | POA: Diagnosis not present

## 2018-10-09 DIAGNOSIS — I5032 Chronic diastolic (congestive) heart failure: Secondary | ICD-10-CM | POA: Diagnosis not present

## 2018-10-11 NOTE — Progress Notes (Signed)
EPIC Encounter for ICM Monitoring  Patient Name: Jacqueline Lozano is a 84 y.o. female Date: 10/11/2018 Primary Care Physican: [name], MD Primary Cardiologist:[name] Electrophysiologist: Vergie Living Pacing: 99.8% 10/11/2018 Weight: unknown  Battery Longevity: 2.78~10months(<1-6 months)  Spoke with patient and she said she is doing well. She has not had any symptoms.   Thoracic impedancenormal but suggested fluid accumulation 09/17/2018 -10/08/2018.  Prescribed:Furosemide 20 mg Take 1 tablet (20 mg total) by mouth as directed. Take once daily with extra dose after lunch if you have leg swelling. Klor Con 10 mEq 1 tablet daily  Recommendations:No changes and encouraged to call if experiencing any fluid symptoms.  Follow-up plan: ICM clinic phone appointment on 11/21/2018.   Copy of ICM check sent to [place].  3 month ICM trend: 10/09/2018    1 Year ICM trend:       Rosalene Billings, RN 10/11/2018 8:32 AM

## 2018-10-16 ENCOUNTER — Telehealth: Payer: Self-pay | Admitting: Cardiovascular Disease

## 2018-10-16 NOTE — Telephone Encounter (Signed)
Pt needs a letter stating she does not need to wear a mask due to her breathing. Please call to discuss.

## 2018-10-17 NOTE — Telephone Encounter (Signed)
Dr. Rockey Situ, please Arnoldo Hooker.   I don't feel right telling a patient not to wear a mask due to the current COVID situation. What are your thoughts since you know her?  Thank you!

## 2018-10-17 NOTE — Telephone Encounter (Signed)
She can wear a mask just keep it loose fitting so does not obstruct her breathing but it will help limit people coughing on her

## 2018-10-18 NOTE — Telephone Encounter (Signed)
No answer. Left detailed message, ok per DPR, with Dr Donivan Scull recommendations as well that all [facility] facilities require universal masking of patient's and staff at this time. I apologized for the inconvenience and said she could call back if she has any further questions or concerns.

## 2018-10-23 ENCOUNTER — Other Ambulatory Visit: Payer: Self-pay | Admitting: Cardiovascular Disease

## 2018-10-23 NOTE — Telephone Encounter (Signed)
Please review for refill. Thanks!  

## 2018-11-20 ENCOUNTER — Ambulatory Visit (INDEPENDENT_AMBULATORY_CARE_PROVIDER_SITE_OTHER): Payer: Medicare Other | Admitting: *Deleted

## 2018-11-20 DIAGNOSIS — I442 Atrioventricular block, complete: Secondary | ICD-10-CM | POA: Diagnosis not present

## 2018-11-20 LAB — CUP PACEART REMOTE DEVICE CHECK
Battery Remaining Longevity: 3 mo
Battery Voltage: 2.77 V
Brady Statistic AP VP Percent: 0 %
Brady Statistic AP VS Percent: 0 %
Brady Statistic AS VP Percent: 99.83 %
Brady Statistic AS VS Percent: 0.17 %
Brady Statistic RA Percent Paced: 0 %
Brady Statistic RV Percent Paced: 99.79 %
Date Time Interrogation Session: 20200615121146
Implantable Lead Implant Date: 20120106
Implantable Lead Implant Date: 20120106
Implantable Lead Location: 753858
Implantable Lead Location: 753860
Implantable Lead Model: 5076
Implantable Pulse Generator Implant Date: 20120106
Lead Channel Impedance Value: 1083 Ohm
Lead Channel Impedance Value: 4047 Ohm
Lead Channel Impedance Value: 4047 Ohm
Lead Channel Impedance Value: 437 Ohm
Lead Channel Impedance Value: 494 Ohm
Lead Channel Impedance Value: 608 Ohm
Lead Channel Impedance Value: 722 Ohm
Lead Channel Impedance Value: 722 Ohm
Lead Channel Impedance Value: 798 Ohm
Lead Channel Pacing Threshold Amplitude: 0.75 V
Lead Channel Pacing Threshold Amplitude: 1.875 V
Lead Channel Pacing Threshold Pulse Width: 0.4 ms
Lead Channel Pacing Threshold Pulse Width: 1 ms
Lead Channel Sensing Intrinsic Amplitude: 5.125 mV
Lead Channel Sensing Intrinsic Amplitude: 5.125 mV
Lead Channel Setting Pacing Amplitude: 2.5 V
Lead Channel Setting Pacing Pulse Width: 0.4 ms
Lead Channel Setting Sensing Sensitivity: 0.9 mV

## 2018-11-21 ENCOUNTER — Telehealth: Payer: Self-pay | Admitting: Student

## 2018-11-21 ENCOUNTER — Ambulatory Visit (INDEPENDENT_AMBULATORY_CARE_PROVIDER_SITE_OTHER): Payer: Medicare Other

## 2018-11-21 DIAGNOSIS — I5032 Chronic diastolic (congestive) heart failure: Secondary | ICD-10-CM | POA: Diagnosis not present

## 2018-11-21 DIAGNOSIS — Z950 Presence of cardiac pacemaker: Secondary | ICD-10-CM

## 2018-11-21 NOTE — Telephone Encounter (Signed)
ERI as of 11/12/2018. LMOM Had previously discussed gen change procedure with patient via device clinic, but will need appt with Dr. Caryl Comes to discuss further.

## 2018-11-22 ENCOUNTER — Telehealth: Payer: Self-pay

## 2018-11-22 NOTE — Progress Notes (Signed)
EPIC Encounter for ICM Monitoring  Patient Name: Jacqueline Lozano is a 84 y.o. female Date: 11/22/2018 Primary Care Physican: [name], MD Primary Cardiologist:[name] Electrophysiologist: Vergie Living Pacing: 99.8% 11/22/2018 Weight: unknown  OBSERVATIONS (5)  RRT (12-Nov-2018): REPLACE DEVICE. Less than 3 months to ERI (VVI/65).  Possible fluid accumulation: exceeded OptiVol Threshold, 04-Oct-2018 -- 31-Oct-2018.  Atrial bipolar lead impedance warning on 20-May-2018.  Atrial unipolar lead impedance warning on 20-May-2018.  Patient Activity less than 1 hr/day for 1 weeks.  Attempted call to patient and unable to reach. Left detailed message per DPR regarding transmission. Transmission reviewed.   Thoracic impedancenormal.  Prescribed:Furosemide 20 mg 1 tablet twice a day as needed. Patient takingdifferently -1 tablet 20 mg daily. Klor Con 10 mEq 1 tablet daily  Recommendations:Left voice mail with ICM number and encouraged to call if experiencing any fluid symptoms.  Follow-up plan: ICM clinic phone appointment on 12/25/2018. Pt needs appointment with Dr Caryl Comes to discuss gen change.  Copy of ICM check sent to [place].   3 month ICM trend: 11/21/2018    1 Year ICM trend:       Rosalene Billings, RN 11/22/2018 9:35 AM

## 2018-11-22 NOTE — Telephone Encounter (Signed)
LMOVM for pt to return call to DC.

## 2018-11-22 NOTE — Telephone Encounter (Signed)
Remote ICM transmission received.  Attempted call to patient regarding ICM remote transmission and left detailed message, per DPR, with next ICM remote transmission date of 12/25/2018.  Advised to return call for any fluid symptoms or questions.

## 2018-11-23 NOTE — Telephone Encounter (Signed)
LMOVM for pt to return call 

## 2018-11-24 NOTE — Telephone Encounter (Signed)
Spoke to pt regarding ERI alert as of 11/12/18, explained pt will need appt with Dr. Caryl Comes to discuss further. No questions at this time.

## 2018-11-28 ENCOUNTER — Encounter: Payer: Self-pay | Admitting: Cardiology

## 2018-11-28 NOTE — Telephone Encounter (Signed)
Please call to schedule for 12/14/2018 with Dr. Caryl Comes to discuss gen change.

## 2018-11-28 NOTE — Progress Notes (Signed)
Remote pacemaker transmission.   

## 2018-11-30 ENCOUNTER — Other Ambulatory Visit: Payer: Self-pay | Admitting: Cardiovascular Disease

## 2018-12-13 ENCOUNTER — Telehealth: Payer: Self-pay | Admitting: Internal Medicine

## 2018-12-13 NOTE — Telephone Encounter (Signed)

## 2018-12-14 ENCOUNTER — Other Ambulatory Visit: Payer: Self-pay

## 2018-12-14 ENCOUNTER — Ambulatory Visit (INDEPENDENT_AMBULATORY_CARE_PROVIDER_SITE_OTHER): Payer: Medicare Other | Admitting: Internal Medicine

## 2018-12-14 ENCOUNTER — Encounter: Payer: Self-pay | Admitting: Internal Medicine

## 2018-12-14 VITALS — BP 138/72 | HR 72 | Ht 62.0 in | Wt 217.0 lb

## 2018-12-14 DIAGNOSIS — I442 Atrioventricular block, complete: Secondary | ICD-10-CM

## 2018-12-14 DIAGNOSIS — Z950 Presence of cardiac pacemaker: Secondary | ICD-10-CM | POA: Diagnosis not present

## 2018-12-14 DIAGNOSIS — I4821 Permanent atrial fibrillation: Secondary | ICD-10-CM

## 2018-12-14 NOTE — Patient Instructions (Signed)
Medication Instructions:  - Your physician recommends that you continue on your current medications as directed. Please refer to the Current Medication list given to you today.  If you need a refill on your cardiac medications before your next appointment, please call your pharmacy.   Lab work: - pending procedure date  - you will also need a pre-procedure COVID-19 swab which we will set up for you once we know the date of your procedure, this is done 3 days ahead of the procedure date and once done, you will need to self quarantine at home until the time of your procedure  If you have labs (blood work) drawn today and your tests are completely normal, you will receive your results only by: Marland Kitchen MyChart Message (if you have MyChart) OR . A paper copy in the mail If you have any lab test that is abnormal or we need to change your treatment, we will call you to review the results.  Testing/Procedures: - Your physician has recommended that you have a Pacemaker Generator (Battery) change  Current available dates are: Friday 12/22/18 Wednesday 12/27/18 Friday 12/29/18 Monday 01/01/19  Once you decide on a date, please call Dr. Olin Pia nurse Nira Conn at [phone].  Follow-Up: At [facility], you and your health needs are our priority.  As part of our continuing mission to provide you with exceptional heart care, we have created designated Provider Care Teams.  These Care Teams include your primary Cardiologist (physician) and Advanced Practice Providers (APPs -  Physician Assistants and Nurse Practitioners) who all work together to provide you with the care you need, when you need it. . pending procedure date  Any Other Special Instructions Will Be Listed Below (If Applicable). - N/A

## 2018-12-14 NOTE — H&P (View-Only) (Signed)
Patient Care Team: [name], MD as PCP - General (Family Medicine) Rockey Situ [name], MD as Consulting Physician (Cardiology)   HPI  Jacqueline Lozano is a 84 y.o. female seen in followup for pacemaker implantation for tachybradycardia syndrome. She has a history of permanent atrial fibrillation and status post CRT P. she is status post AV junction ablation  LV lead has been permanently turned off because of diaphragmatic stimulation  She has chronic shortness of breath.  This has been looked at considerably. Dr. Dollene Cleveland notes from 4/20 were reviewed.   Functional status is stable.  Denies chest discomfort.  No edema.  Chronic shortness of breath but unchanged.  D.   DATE TEST EF   2/16    Echo   EF 65 % Mild AS Grad 11  10/18    Myoview   EF 70 % No ischemia  2/19 Echo  65-70% Mild AS grad 6    Date Cr Hgb  4/18 1.4 12.2   1/19 1.36 12.8  4/19 1.4 13.0  6/20 1.4 12.9         Past Medical History:  Diagnosis Date  . Abnormal echocardiogram 09/2009   EF >55%, mild LVH, moderate LAE, normal RV, normal PA systolic pressure  . Allergic to IV contrast   . Chronic diastolic CHF (congestive heart failure) ([place])   . Complete heart block-S./P. AV junction ablation 11/23/2010  . GERD (gastroesophageal reflux disease)   . Hyperlipidemia    Has been unable to take statins due to muscle pain. Has tried multiple statins per her report.  . Hypertension   . Hypothyroidism   . Migraine   . OSA (obstructive sleep apnea)    Not consistently using CPAP  . Pacemaker-Medtronic CRT P. 11/23/2010   Atrial port was plugged   . PAD (peripheral artery disease) (H[facility])    Occlusive disease involving left PT and AT  . Paroxysmal atrial fibrillation ([facility])    Breakthrough a fib on sotalol, changed to amiodarone 4/11. DCCV 4/11    Past Surgical History:  Procedure Laterality Date  . APPENDECTOMY    . ATRIAL ABLATION SURGERY  06/12/2010  . CARDIAC CATHETERIZATION     Left heart cath  x3 in teh past, last 5 yrs ago. All "normal" per pt report  . CARDIOVERSION    . CARPAL TUNNEL RELEASE    . CHOLECYSTECTOMY OPEN    . COLONOSCOPY WITH PROPOFOL N/A 09/08/2015   Procedure: COLONOSCOPY WITH PROPOFOL;  Surgeon: Josefine Class, MD;  Location[facility] ENDOSCOPY;  Service: Endoscopy;  Laterality: N/A;  . ESOPHAGOGASTRODUODENOSCOPY (EGD) WITH PROPOFOL N/A 09/08/2015   Procedure: ESOPHAGOGASTRODUODENOSCOPY (EGD) WITH PROPOFOL;  Surgeon: Josefine Class, MD;  Location: [facility] ENDOSCOPY;  Service: Endoscopy;  Laterality: N/A;  . PACEMAKER INSERTION  06/12/2010  . VESICOVAGINAL FISTULA CLOSURE W/ TAH      Current Outpatient Medications  Medication Sig Dispense Refill  . ciprofloxacin (CIPRO) 250 MG tablet Take by mouth.    Arne Cleveland 5 MG TABS tablet Take 1 tablet by mouth twice daily 180 tablet 0  . famotidine (PEPCID) 10 MG tablet Take 10 mg by mouth daily as needed. Reported on 08/19/2015    . furosemide (LASIX) 20 MG tablet Take 1 tablet (20 mg total) by mouth as directed. Take once daily with extra dose after lunch if you have leg swelling. 180 tablet 3  . GuaiFENesin (MUCINEX PO) Take by mouth as needed.    Marland Kitchen KLOR-CON M10  10 MEQ tablet Take 1 tablet by mouth once daily 90 tablet 2  . levothyroxine (SYNTHROID, LEVOTHROID) 88 MCG tablet Take 88 mcg by mouth daily.    . meclizine (ANTIVERT) 25 MG tablet TAKE ONE TABLET BY MOUTH THREE TIMES DAILY AS NEEDED 90 tablet 1  . metoprolol tartrate (LOPRESSOR) 50 MG tablet TAKE 1 & 1/2 (ONE & ONE-HALF) TABLETS BY MOUTH TWICE DAILY 270 tablet 1  . nitroGLYCERIN (NITROSTAT) 0.4 MG SL tablet Place 1 tablet (0.4 mg total) under the tongue every 5 (five) minutes as needed for chest pain. 25 tablet 3  . prochlorperazine (COMPAZINE) 5 MG tablet Take 1 tablet (5 mg total) by mouth every 6 (six) hours as needed. 90 tablet 5  . VITAMIN B1-B12 IM Inject into the muscle every 30 (thirty) days.     No current facility-administered medications for this  visit.     Allergies  Allergen Reactions  . Iodinated Diagnostic Agents Shortness Of Breath and Other (See Comments)  . Other Shortness Of Breath, Nausea And Vomiting and Other (See Comments)    Contrast dye  . Codeine Other (See Comments)  . Codeine Sulfate Other (See Comments)  . Dipyridamole Other (See Comments)  . Iodine Other (See Comments)  . Morphine Other (See Comments)  . Oxycodone-Acetaminophen   . Oxycodone-Acetaminophen Other (See Comments)  . Shellfish-Derived Products     Other reaction(s): Unknown  . Statins Other (See Comments)    Other reaction(s): Unknown  . Sulfa Antibiotics Other (See Comments)    Other reaction(s): Unknown  . Sulfonamide Derivatives   . Amlodipine Rash and Other (See Comments)    Review of Systems negative except from HPI and PMH  Physical Exam BP 138/72 (BP Location: Left Arm, Patient Position: Sitting, Cuff Size: Normal)   Pulse 72   Ht 5\' 2"  (1.575 m)   Wt 217 lb (98.4 kg)   BMI 39.69 kg/m  Well developed and well nourished in no acute distress HENT normal Neck supple with JVP-flat Clear Device pocket well healed; without hematoma or erythema.  There is no tethering  Regular rate and rhythm, no   murmur Abd-soft with active BS No Clubbing cyanosis she is that she is needs her agenda GEN change she feels better edema Skin-warm and dry A & Oriented  Grossly normal sensory and motor function  ECG ventricular pacing at 72  Remote device interrogation from 11/20/2018 was reviewed Medtronic CRT-P-device and RRT 6/20   Assessment and  Plan  HFpEF  Atrial fibrillation-permanent  AV junction ablation-complete heart block  Pacemaker-CRT   LV lead inactivated secondary to diaphragmatic stimulation  Aortic stenosis-mild  Hypertension   BP well controlled  On Anticoagulation;  No bleeding issues   Euvolemic continue current meds  Device at [facility]  Will need generator replacement. We have reviewed the benefits and risks of  generator replacement.  These include but are not limited to lead fracture and infection.  The patient understands, agrees and is willing to proceed.

## 2018-12-14 NOTE — Progress Notes (Signed)
Patient Care Team: [name], MD as PCP - General (Family Medicine) Rockey Situ [name], MD as Consulting Physician (Cardiology)   HPI  Jacqueline Lozano is a 84 y.o. female seen in followup for pacemaker implantation for tachybradycardia syndrome. She has a history of permanent atrial fibrillation and status post CRT P. she is status post AV junction ablation  LV lead has been permanently turned off because of diaphragmatic stimulation  She has chronic shortness of breath.  This has been looked at considerably. Dr. Dollene Cleveland notes from 4/20 were reviewed.   Functional status is stable.  Denies chest discomfort.  No edema.  Chronic shortness of breath but unchanged.  D.   DATE TEST EF   2/16    Echo   EF 65 % Mild AS Grad 11  10/18    Myoview   EF 70 % No ischemia  2/19 Echo  65-70% Mild AS grad 6    Date Cr Hgb  4/18 1.4 12.2   1/19 1.36 12.8  4/19 1.4 13.0  6/20 1.4 12.9         Past Medical History:  Diagnosis Date  . Abnormal echocardiogram 09/2009   EF >55%, mild LVH, moderate LAE, normal RV, normal PA systolic pressure  . Allergic to IV contrast   . Chronic diastolic CHF (congestive heart failure) ([place])   . Complete heart block-S./P. AV junction ablation 11/23/2010  . GERD (gastroesophageal reflux disease)   . Hyperlipidemia    Has been unable to take statins due to muscle pain. Has tried multiple statins per her report.  . Hypertension   . Hypothyroidism   . Migraine   . OSA (obstructive sleep apnea)    Not consistently using CPAP  . Pacemaker-Medtronic CRT P. 11/23/2010   Atrial port was plugged   . PAD (peripheral artery disease) ([facility])    Occlusive disease involving left PT and AT  . Paroxysmal atrial fibrillation ([facility])    Breakthrough a fib on sotalol, changed to amiodarone 4/11. DCCV 4/11    Past Surgical History:  Procedure Laterality Date  . APPENDECTOMY    . ATRIAL ABLATION SURGERY  06/12/2010  . CARDIAC CATHETERIZATION     Left heart cath  x3 in teh past, last 5 yrs ago. All "normal" per pt report  . CARDIOVERSION    . CARPAL TUNNEL RELEASE    . CHOLECYSTECTOMY OPEN    . COLONOSCOPY WITH PROPOFOL N/A 09/08/2015   Procedure: COLONOSCOPY WITH PROPOFOL;  Surgeon: Josefine Class, MD;  Location: [facility] ENDOSCOPY;  Service: Endoscopy;  Laterality: N/A;  . ESOPHAGOGASTRODUODENOSCOPY (EGD) WITH PROPOFOL N/A 09/08/2015   Procedure: ESOPHAGOGASTRODUODENOSCOPY (EGD) WITH PROPOFOL;  Surgeon: Josefine Class, MD;  Location[facility] ENDOSCOPY;  Service: Endoscopy;  Laterality: N/A;  . PACEMAKER INSERTION  06/12/2010  . VESICOVAGINAL FISTULA CLOSURE W/ TAH      Current Outpatient Medications  Medication Sig Dispense Refill  . ciprofloxacin (CIPRO) 250 MG tablet Take by mouth.    Arne Cleveland 5 MG TABS tablet Take 1 tablet by mouth twice daily 180 tablet 0  . famotidine (PEPCID) 10 MG tablet Take 10 mg by mouth daily as needed. Reported on 08/19/2015    . furosemide (LASIX) 20 MG tablet Take 1 tablet (20 mg total) by mouth as directed. Take once daily with extra dose after lunch if you have leg swelling. 180 tablet 3  . GuaiFENesin (MUCINEX PO) Take by mouth as needed.    Marland Kitchen KLOR-CON M10  10 MEQ tablet Take 1 tablet by mouth once daily 90 tablet 2  . levothyroxine (SYNTHROID, LEVOTHROID) 88 MCG tablet Take 88 mcg by mouth daily.    . meclizine (ANTIVERT) 25 MG tablet TAKE ONE TABLET BY MOUTH THREE TIMES DAILY AS NEEDED 90 tablet 1  . metoprolol tartrate (LOPRESSOR) 50 MG tablet TAKE 1 & 1/2 (ONE & ONE-HALF) TABLETS BY MOUTH TWICE DAILY 270 tablet 1  . nitroGLYCERIN (NITROSTAT) 0.4 MG SL tablet Place 1 tablet (0.4 mg total) under the tongue every 5 (five) minutes as needed for chest pain. 25 tablet 3  . prochlorperazine (COMPAZINE) 5 MG tablet Take 1 tablet (5 mg total) by mouth every 6 (six) hours as needed. 90 tablet 5  . VITAMIN B1-B12 IM Inject into the muscle every 30 (thirty) days.     No current facility-administered medications for this  visit.     Allergies  Allergen Reactions  . Iodinated Diagnostic Agents Shortness Of Breath and Other (See Comments)  . Other Shortness Of Breath, Nausea And Vomiting and Other (See Comments)    Contrast dye  . Codeine Other (See Comments)  . Codeine Sulfate Other (See Comments)  . Dipyridamole Other (See Comments)  . Iodine Other (See Comments)  . Morphine Other (See Comments)  . Oxycodone-Acetaminophen   . Oxycodone-Acetaminophen Other (See Comments)  . Shellfish-Derived Products     Other reaction(s): Unknown  . Statins Other (See Comments)    Other reaction(s): Unknown  . Sulfa Antibiotics Other (See Comments)    Other reaction(s): Unknown  . Sulfonamide Derivatives   . Amlodipine Rash and Other (See Comments)    Review of Systems negative except from HPI and PMH  Physical Exam BP 138/72 (BP Location: Left Arm, Patient Position: Sitting, Cuff Size: Normal)   Pulse 72   Ht 5\' 2"  (1.575 m)   Wt 217 lb (98.4 kg)   BMI 39.69 kg/m  Well developed and well nourished in no acute distress HENT normal Neck supple with JVP-flat Clear Device pocket well healed; without hematoma or erythema.  There is no tethering  Regular rate and rhythm, no   murmur Abd-soft with active BS No Clubbing cyanosis she is that she is needs her agenda GEN change she feels better edema Skin-warm and dry A & Oriented  Grossly normal sensory and motor function  ECG ventricular pacing at 72  Remote device interrogation from 11/20/2018 was reviewed Medtronic CRT-P-device and RRT 6/20   Assessment and  Plan  HFpEF  Atrial fibrillation-permanent  AV junction ablation-complete heart block  Pacemaker-CRT   LV lead inactivated secondary to diaphragmatic stimulation  Aortic stenosis-mild  Hypertension   BP well controlled  On Anticoagulation;  No bleeding issues   Euvolemic continue current meds  Device at [facility]  Will need generator replacement. We have reviewed the benefits and risks of  generator replacement.  These include but are not limited to lead fracture and infection.  The patient understands, agrees and is willing to proceed.

## 2018-12-15 ENCOUNTER — Telehealth: Payer: Self-pay | Admitting: Internal Medicine

## 2018-12-15 ENCOUNTER — Encounter: Payer: Self-pay | Admitting: *Deleted

## 2018-12-15 DIAGNOSIS — I4821 Permanent atrial fibrillation: Secondary | ICD-10-CM

## 2018-12-15 DIAGNOSIS — I442 Atrioventricular block, complete: Secondary | ICD-10-CM

## 2018-12-15 DIAGNOSIS — Z01812 Encounter for preprocedural laboratory examination: Secondary | ICD-10-CM

## 2018-12-15 NOTE — Telephone Encounter (Signed)
Please call to discuss dates for procedure.

## 2018-12-15 NOTE — Telephone Encounter (Signed)
Attempted to call the patient. I was able to speak with her by phone. Confirmed her procedure date for 12/22/18. She is aware she will need to come to the office on 7/14 for a lab draw, then proceed to [place] for a COVID swab.  She is aware I will have a written copy of all of her instructions and will call her back to confirm the times of the COVID swabs on 12/19/18.  The patient voices understanding and is agreeable.

## 2018-12-15 NOTE — Addendum Note (Signed)
Addended by: Alba Destine on: 12/15/2018 02:54 PM   Modules accepted: Orders

## 2018-12-19 ENCOUNTER — Other Ambulatory Visit
Admission: RE | Admit: 2018-12-19 | Discharge: 2018-12-19 | Disposition: A | Discharge status: Home / Self Care | Payer: Medicare Other | Source: Ambulatory Visit | Attending: Internal Medicine | Admitting: Internal Medicine

## 2018-12-19 ENCOUNTER — Other Ambulatory Visit: Payer: Self-pay

## 2018-12-19 ENCOUNTER — Other Ambulatory Visit: Payer: Self-pay | Admitting: *Deleted

## 2018-12-19 ENCOUNTER — Telehealth: Payer: Self-pay | Admitting: Internal Medicine

## 2018-12-19 ENCOUNTER — Other Ambulatory Visit: Payer: Medicare Other

## 2018-12-19 DIAGNOSIS — I4821 Permanent atrial fibrillation: Secondary | ICD-10-CM

## 2018-12-19 DIAGNOSIS — Z01812 Encounter for preprocedural laboratory examination: Secondary | ICD-10-CM

## 2018-12-19 DIAGNOSIS — Z1159 Encounter for screening for other viral diseases: Secondary | ICD-10-CM | POA: Diagnosis not present

## 2018-12-19 LAB — CBC WITH DIFFERENTIAL/PLATELET
Abs Immature Granulocytes: 0.04 10*3/uL (ref 0.00–0.07)
Basophils Absolute: 0.1 10*3/uL (ref 0.0–0.1)
Basophils Relative: 1 %
Eosinophils Absolute: 0 10*3/uL (ref 0.0–0.5)
Eosinophils Relative: 0 %
HCT: 37.3 % (ref 36.0–46.0)
Hemoglobin: 12.3 g/dL (ref 12.0–15.0)
Immature Granulocytes: 0 %
Lymphocytes Relative: 24 %
Lymphs Abs: 2.4 10*3/uL (ref 0.7–4.0)
MCH: 29.6 pg (ref 26.0–34.0)
MCHC: 33 g/dL (ref 30.0–36.0)
MCV: 89.9 fL (ref 80.0–100.0)
Monocytes Absolute: 1.2 10*3/uL — ABNORMAL HIGH (ref 0.1–1.0)
Monocytes Relative: 12 %
Neutro Abs: 6.3 10*3/uL (ref 1.7–7.7)
Neutrophils Relative %: 63 %
Platelets: 271 10*3/uL (ref 150–400)
RBC: 4.15 MIL/uL (ref 3.87–5.11)
RDW: 14.4 % (ref 11.5–15.5)
WBC: 10 10*3/uL (ref 4.0–10.5)
nRBC: 0 % (ref 0.0–0.2)

## 2018-12-19 LAB — BASIC METABOLIC PANEL
Anion gap: 8 (ref 5–15)
BUN: 26 mg/dL — ABNORMAL HIGH (ref 8–23)
CO2: 20 mmol/L — ABNORMAL LOW (ref 22–32)
Calcium: 9.4 mg/dL (ref 8.9–10.3)
Chloride: 109 mmol/L (ref 98–111)
Creatinine, Ser: 1.39 mg/dL — ABNORMAL HIGH (ref 0.44–1.00)
GFR calc Af Amer: 41 mL/min — ABNORMAL LOW (ref >60)
GFR calc non Af Amer: 35 mL/min — ABNORMAL LOW (ref >60)
Glucose, Bld: 99 mg/dL (ref 70–99)
Potassium: 4.4 mmol/L (ref 3.5–5.1)
Sodium: 137 mmol/L (ref 135–145)

## 2018-12-19 NOTE — Telephone Encounter (Signed)
Patient calling in to see if she is able to have a support person with her on Friday for her procedure. She overheard in office that support personal would be started this week and she wanted to confirm that with a nurse  Please advise

## 2018-12-20 LAB — SARS CORONAVIRUS 2 (TAT 6-24 HRS): SARS Coronavirus 2: NEGATIVE

## 2018-12-20 NOTE — Telephone Encounter (Signed)
Spoke with patient and reviewed that at this time I do not know if they will allow person to come in with her but that she could try. Most facilities have a check in desk and will have that information on the day of her procedure. She verbalized understanding and was very appreciative for the call back and aware that it may not be possible to have support person. She had no further questions at this time.

## 2018-12-22 ENCOUNTER — Other Ambulatory Visit: Payer: Self-pay

## 2018-12-22 ENCOUNTER — Ambulatory Visit (HOSPITAL_COMMUNITY)
Admission: RE | Disposition: A | Discharge status: Home / Self Care | Payer: Self-pay | Source: Home / Self Care | Attending: Internal Medicine

## 2018-12-22 ENCOUNTER — Ambulatory Visit (HOSPITAL_COMMUNITY)
Admission: RE | Admit: 2018-12-22 | Discharge: 2018-12-22 | Disposition: A | Discharge status: Home / Self Care | Payer: Medicare Other | Attending: Internal Medicine | Admitting: Internal Medicine

## 2018-12-22 DIAGNOSIS — Z006 Encounter for examination for normal comparison and control in clinical research program: Secondary | ICD-10-CM | POA: Insufficient documentation

## 2018-12-22 DIAGNOSIS — K219 Gastro-esophageal reflux disease without esophagitis: Secondary | ICD-10-CM | POA: Diagnosis not present

## 2018-12-22 DIAGNOSIS — Z79890 Hormone replacement therapy (postmenopausal): Secondary | ICD-10-CM | POA: Diagnosis not present

## 2018-12-22 DIAGNOSIS — Z79899 Other long term (current) drug therapy: Secondary | ICD-10-CM | POA: Insufficient documentation

## 2018-12-22 DIAGNOSIS — Z888 Allergy status to other drugs, medicaments and biological substances status: Secondary | ICD-10-CM | POA: Diagnosis not present

## 2018-12-22 DIAGNOSIS — I350 Nonrheumatic aortic (valve) stenosis: Secondary | ICD-10-CM | POA: Insufficient documentation

## 2018-12-22 DIAGNOSIS — I5032 Chronic diastolic (congestive) heart failure: Secondary | ICD-10-CM | POA: Insufficient documentation

## 2018-12-22 DIAGNOSIS — Z45010 Encounter for checking and testing of cardiac pacemaker pulse generator [battery]: Secondary | ICD-10-CM | POA: Diagnosis not present

## 2018-12-22 DIAGNOSIS — I110 Hypertensive heart disease with heart failure: Secondary | ICD-10-CM | POA: Diagnosis not present

## 2018-12-22 DIAGNOSIS — I442 Atrioventricular block, complete: Secondary | ICD-10-CM | POA: Diagnosis not present

## 2018-12-22 DIAGNOSIS — G4733 Obstructive sleep apnea (adult) (pediatric): Secondary | ICD-10-CM | POA: Diagnosis not present

## 2018-12-22 DIAGNOSIS — Z7901 Long term (current) use of anticoagulants: Secondary | ICD-10-CM | POA: Insufficient documentation

## 2018-12-22 DIAGNOSIS — E039 Hypothyroidism, unspecified: Secondary | ICD-10-CM | POA: Insufficient documentation

## 2018-12-22 DIAGNOSIS — Z882 Allergy status to sulfonamides status: Secondary | ICD-10-CM | POA: Insufficient documentation

## 2018-12-22 DIAGNOSIS — I739 Peripheral vascular disease, unspecified: Secondary | ICD-10-CM | POA: Diagnosis not present

## 2018-12-22 DIAGNOSIS — E785 Hyperlipidemia, unspecified: Secondary | ICD-10-CM | POA: Insufficient documentation

## 2018-12-22 DIAGNOSIS — Z885 Allergy status to narcotic agent status: Secondary | ICD-10-CM | POA: Insufficient documentation

## 2018-12-22 DIAGNOSIS — Z91041 Radiographic dye allergy status: Secondary | ICD-10-CM | POA: Diagnosis not present

## 2018-12-22 HISTORY — PX: BIV PACEMAKER GENERATOR CHANGEOUT: EP1198

## 2018-12-22 LAB — SURGICAL PCR SCREEN
MRSA, PCR: NEGATIVE
Staphylococcus aureus: NEGATIVE

## 2018-12-22 SURGERY — BIV PACEMAKER GENERATOR CHANGEOUT

## 2018-12-22 MED ORDER — FENTANYL CITRATE (PF) 100 MCG/2ML IJ SOLN
INTRAMUSCULAR | Status: DC | PRN
  Administered 2018-12-22 (×3): 25 ug via INTRAVENOUS

## 2018-12-22 MED ORDER — MUPIROCIN 2 % EX OINT
1.0000 "application " | TOPICAL_OINTMENT | Freq: Once | CUTANEOUS | Status: AC
  Administered 2018-12-22: 1 via TOPICAL
  Filled 2018-12-22 (×2): qty 22

## 2018-12-22 MED ORDER — LIDOCAINE HCL 1 % IJ SOLN
INTRAMUSCULAR | Status: AC
  Filled 2018-12-22: qty 60

## 2018-12-22 MED ORDER — CEFAZOLIN SODIUM-DEXTROSE 2-4 GM/100ML-% IV SOLN
INTRAVENOUS | Status: AC
  Filled 2018-12-22: qty 100

## 2018-12-22 MED ORDER — CEFAZOLIN SODIUM-DEXTROSE 2-4 GM/100ML-% IV SOLN
2.0000 g | INTRAVENOUS | Status: AC
  Administered 2018-12-22: 2 g via INTRAVENOUS

## 2018-12-22 MED ORDER — FENTANYL CITRATE (PF) 100 MCG/2ML IJ SOLN
INTRAMUSCULAR | Status: AC
  Filled 2018-12-22: qty 2

## 2018-12-22 MED ORDER — CHLORHEXIDINE GLUCONATE 4 % EX LIQD
60.0000 mL | Freq: Once | CUTANEOUS | Status: DC
  Filled 2018-12-22: qty 60

## 2018-12-22 MED ORDER — LIDOCAINE HCL (PF) 1 % IJ SOLN
INTRAMUSCULAR | Status: DC | PRN
  Administered 2018-12-22: 45 mL

## 2018-12-22 MED ORDER — ACETAMINOPHEN 325 MG PO TABS: 325.0000 mg | ORAL_TABLET | ORAL | Status: DC | PRN

## 2018-12-22 MED ORDER — SODIUM CHLORIDE 0.9 % IV SOLN
INTRAVENOUS | Status: DC
  Administered 2018-12-22: 12:00:00 via INTRAVENOUS

## 2018-12-22 MED ORDER — SODIUM CHLORIDE 0.9 % IV SOLN
INTRAVENOUS | Status: AC
  Filled 2018-12-22: qty 2

## 2018-12-22 MED ORDER — AMOXICILLIN 500 MG PO TABS: 500.0000 mg | ORAL_TABLET | Freq: Three times a day (TID) | ORAL | 0 refills | Status: DC

## 2018-12-22 MED ORDER — ONDANSETRON HCL 4 MG/2ML IJ SOLN: 4.0000 mg | Freq: Four times a day (QID) | INTRAMUSCULAR | Status: DC | PRN

## 2018-12-22 MED ORDER — MIDAZOLAM HCL 5 MG/5ML IJ SOLN
INTRAMUSCULAR | Status: DC | PRN
  Administered 2018-12-22: 2 mg via INTRAVENOUS
  Administered 2018-12-22: 1 mg via INTRAVENOUS

## 2018-12-22 MED ORDER — MIDAZOLAM HCL 5 MG/5ML IJ SOLN
INTRAMUSCULAR | Status: AC
  Filled 2018-12-22: qty 5

## 2018-12-22 MED ORDER — SODIUM CHLORIDE 0.9 % IV SOLN
80.0000 mg | INTRAVENOUS | Status: AC
  Administered 2018-12-22: 80 mg

## 2018-12-22 SURGICAL SUPPLY — 6 items
CABLE SURGICAL S-101-97-12 (CABLE) ×4 IMPLANT
PACEMAKER PRCT MRI CRTP W1TR01 (Pacemaker) IMPLANT
PAD PRO RADIOLUCENT 2001M-C (PAD) ×4 IMPLANT
PIN PLUG IS-1 DEFIB (PIN) ×2 IMPLANT
PPM PRECEPTA MRI CRT-P W1TR01 (Pacemaker) ×3 IMPLANT
TRAY PACEMAKER INSERTION (PACKS) ×4 IMPLANT

## 2018-12-22 NOTE — Progress Notes (Signed)
On arrival from cath lab client states "I am in a wine cellar", Dr Caryl Comes in

## 2018-12-22 NOTE — Interval H&P Note (Signed)
History and Physical Interval Note:  12/22/2018 10:53 AM  Jacqueline Lozano  has presented today for surgery, with the diagnosis of ERI.  The various methods of treatment have been discussed with the patient and family. After consideration of risks, benefits and other options for treatment, the patient has consented to  Procedure(s): PPM GENERATOR CHANGEOUT (N/A) as a surgical intervention.  The patient's history has been reviewed, patient examined, no change in status, stable for surgery.  I have reviewed the patient's chart and labs.  Questions were answered to the patient's satisfaction.     Virl Axe  Colitis with frequent stools   Stable  For device generator replacement

## 2018-12-22 NOTE — Discharge Instructions (Signed)
DERMABOND WILL COME OFF IN 10-14DAYS; IF NOT WILL REMOVE AT WOUND CHECK; KEEP SITE DRY UNTIL TOMORROW EVENING; NO DRIVING X 4 DAYS      Pacemaker Battery Change, Care After This sheet gives you information about how to care for yourself after your procedure. Your health care provider may also give you more specific instructions. If you have problems or questions, contact your health care provider. What can I expect after the procedure? After your procedure, it is common to have:  Pain or soreness at the site where the pacemaker was inserted.  Swelling at the site where the pacemaker was inserted. Follow these instructions at home: Incision care   Keep the incision clean and dry. ? Do not take baths, swim, or use a hot tub until your health care provider approves. ? You may shower the day after your procedure, or as directed by your health care provider. ? Pat the area dry with a clean towel. Do not rub the area. This may cause bleeding.  Follow instructions from your health care provider about how to take care of your incision. Make sure you: ? Wash your hands with soap and water before you change your bandage (dressing). If soap and water are not available, use hand sanitizer. ? Change your dressing as told by your health care provider. ? Leave stitches (sutures), skin glue, or adhesive strips in place. These skin closures may need to stay in place for 2 weeks or longer. If adhesive strip edges start to loosen and curl up, you may trim the loose edges. Do not remove adhesive strips completely unless your health care provider tells you to do that.  Check your incision area every day for signs of infection. Check for: ? More redness, swelling, or pain. ? More fluid or blood. ? Warmth. ? Pus or a bad smell. Activity  Do not lift anything that is heavier than 10 lb (4.5 kg) until your health care provider says it is okay to do so.  For the first 2 weeks, or as long as told by your  health care provider: ? Avoid lifting your left arm higher than your shoulder. ? Be gentle when you move your arms over your head. It is okay to raise your arm to comb your hair. ? Avoid strenuous exercise.  Ask your health care provider when it is okay to: ? Resume your normal activities. ? Return to work or school. ? Resume sexual activity. Eating and drinking  Eat a heart-healthy diet. This should include plenty of fresh fruits and vegetables, whole grains, low-fat dairy products, and lean protein like chicken and fish.  Limit alcohol intake to no more than 1 drink a day for non-pregnant women and 2 drinks a day for men. One drink equals 12 oz of beer, 5 oz of wine, or 1 oz of hard liquor.  Check ingredients and nutrition facts on packaged foods and beverages. Avoid the following types of food: ? Food that is high in salt (sodium). ? Food that is high in saturated fat, like full-fat dairy or red meat. ? Food that is high in trans fat, like fried food. ? Food and drinks that are high in sugar. Lifestyle  Do not use any products that contain nicotine or tobacco, such as cigarettes and e-cigarettes. If you need help quitting, ask your health care provider.  Take steps to manage and control your weight.  Get regular exercise. Aim for 150 minutes of moderate-intensity exercise (such as walking or  yoga) or 75 minutes of vigorous exercise (such as running or swimming) each week.  Manage other health problems, such as diabetes or high blood pressure. Ask your health care provider how you can manage these conditions. General instructions  Do not drive for 24 hours after your procedure if you were given a medicine to help you relax (sedative).  Take over-the-counter and prescription medicines only as told by your health care provider.  Avoid putting pressure on the area where the pacemaker was placed.  If you need an MRI after your pacemaker has been placed, be sure to tell the health  care provider who orders the MRI that you have a pacemaker.  Avoid close and prolonged exposure to electrical devices that have strong magnetic fields. These include: ? Cell phones. Avoid keeping them in a pocket near the pacemaker, and try using the ear opposite the pacemaker. ? MP3 players. ? Household appliances, like microwaves. ? Metal detectors. ? Electric generators. ? High-tension wires.  Keep all follow-up visits as directed by your health care provider. This is important. Contact a health care provider if:  You have pain at the incision site that is not relieved by over-the-counter or prescription medicines.  You have any of these around your incision site or coming from it: ? More redness, swelling, or pain. ? Fluid or blood. ? Warmth to the touch. ? Pus or a bad smell.  You have a fever.  You feel brief, occasional palpitations, light-headedness, or any symptoms that you think might be related to your heart. Get help right away if:  You experience chest pain that is different from the pain at the pacemaker site.  You develop a red streak that extends above or below the incision site.  You experience shortness of breath.  You have palpitations or an irregular heartbeat.  You have light-headedness that does not go away quickly.  You faint or have dizzy spells.  Your pulse suddenly drops or increases rapidly and does not return to normal.  You begin to gain weight and your legs and ankles swell. Summary  After your procedure, it is common to have pain, soreness, and some swelling where the pacemaker was inserted.  Make sure to keep your incision clean and dry. Follow instructions from your health care provider about how to take care of your incision.  Check your incision every day for signs of infection, such as more pain or swelling, pus or a bad smell, warmth, or leaking fluid and blood.  Avoid strenuous exercise and lifting your left arm higher than your  shoulder for 2 weeks, or as long as told by your health care provider. This information is not intended to replace advice given to you by your health care provider. Make sure you discuss any questions you have with your health care provider. Document Released: 03/14/2013 Document Revised: 05/06/2017 Document Reviewed: 04/15/2016 Elsevier Patient Education  2020 [employer].

## 2018-12-22 NOTE — Progress Notes (Signed)
Dr. Caryl Comes asked an rx for amoxicillin 500mg  Q8 x5 days be sent for patient 2/2 UTI. Sent electronically to [employer] on file for patient Jacqueline Standard, PA-C

## 2018-12-22 NOTE — Interval H&P Note (Signed)
History and Physical Interval Note:  12/22/2018 9:14 AM  Jacqueline Lozano  has presented today for surgery, with the diagnosis of ERI.  The various methods of treatment have been discussed with the patient and family. After consideration of risks, benefits and other options for treatment, the patient has consented to  Procedure(s): PPM GENERATOR CHANGEOUT (N/A) as a surgical intervention.  The patient's history has been reviewed, patient examined, no change in status, stable for surgery.  I have reviewed the patient's chart and labs.  Questions were answered to the patient's satisfaction.     Virl Axe

## 2018-12-25 ENCOUNTER — Encounter (HOSPITAL_COMMUNITY): Payer: Self-pay | Admitting: Internal Medicine

## 2018-12-25 ENCOUNTER — Telehealth: Payer: Self-pay

## 2018-12-25 MED FILL — Lidocaine HCl Local Inj 1%: INTRAMUSCULAR | Qty: 60 | Status: AC

## 2018-12-25 NOTE — Telephone Encounter (Signed)
Call to patient.  Spoke with daughter and patient regarding set up of Carelink monitor following implant of new pacemaker.  Provided [employer] tech service number and encouraged to call at earliest convenience.  Advised will next ICM Remote Transmission will be 02/05/2019 due to it takes 6 weeks for to develop baseline for fluid levels.  Encouraged to call if she experiences any fluid symptoms.  The new pacemaker device will send an automatic report when scheduled.

## 2018-12-27 NOTE — Telephone Encounter (Signed)
Received call from daughter, on [place].  She had many questions regarding patients new device, function and monitoring. Explained the device functions like the previous one and will send same report.  Advised the difference will be the report can be scheduled to be sent automatically instead of always sending it manually.  Advised monitor should be placed where she is sleeping in order for the automatic report to be sent.  Advised scheduled ICM report for 02/05/2019 to give the device time to develop a fluid level baseline.  Reminded of 7/30 office visit with Dr Caryl Comes.  Initial carelink report has been sent.

## 2019-01-04 ENCOUNTER — Other Ambulatory Visit: Payer: Self-pay

## 2019-01-04 ENCOUNTER — Telehealth: Payer: Self-pay

## 2019-01-04 ENCOUNTER — Ambulatory Visit (INDEPENDENT_AMBULATORY_CARE_PROVIDER_SITE_OTHER): Payer: Medicare Other | Admitting: *Deleted

## 2019-01-04 DIAGNOSIS — I442 Atrioventricular block, complete: Secondary | ICD-10-CM | POA: Diagnosis not present

## 2019-01-04 LAB — CUP PACEART INCLINIC DEVICE CHECK
Battery Remaining Longevity: 137 mo
Battery Voltage: 3.21 V
Brady Statistic AP VP Percent: 0 %
Brady Statistic AP VS Percent: 0 %
Brady Statistic AS VP Percent: 99.92 %
Brady Statistic AS VS Percent: 0.08 %
Brady Statistic RA Percent Paced: 0 %
Brady Statistic RV Percent Paced: 99.92 %
Date Time Interrogation Session: 20200730115535
Implantable Lead Implant Date: 20120106
Implantable Lead Implant Date: 20120106
Implantable Lead Location: 753858
Implantable Lead Location: 753860
Implantable Lead Model: 5076
Implantable Pulse Generator Implant Date: 20200717
Lead Channel Impedance Value: 3306 Ohm
Lead Channel Impedance Value: 3306 Ohm
Lead Channel Impedance Value: 380 Ohm
Lead Channel Impedance Value: 456 Ohm
Lead Channel Impedance Value: 494 Ohm
Lead Channel Impedance Value: 551 Ohm
Lead Channel Impedance Value: 627 Ohm
Lead Channel Impedance Value: 684 Ohm
Lead Channel Impedance Value: 912 Ohm
Lead Channel Pacing Threshold Amplitude: 0.75 V
Lead Channel Pacing Threshold Pulse Width: 0.4 ms
Lead Channel Setting Pacing Amplitude: 2.5 V
Lead Channel Setting Pacing Pulse Width: 0.4 ms
Lead Channel Setting Sensing Sensitivity: 2.8 mV

## 2019-01-04 NOTE — Progress Notes (Signed)
Wound check appointment. Steri-strips removed. Wound without redness or edema. Incision edges approximated, wound well healed. Normal device function. Thresholds, sensing, and impedances consistent with implant measurements. Device programmed at appropriate safety margins. LV lead inactivated 2/20 for diaphramatic STIM. Histogram distribution appropriate for patient and level of activity. No mode switches or high ventricular rates noted. Patient educated about wound care, arm mobility, lifting restrictions. Remote f/u scheduled for 02/05/19 and f/u with Dr Caryl Comes 03/27/19.

## 2019-01-04 NOTE — Telephone Encounter (Signed)
    COVID-19 Pre-Screening Questions:  . In the past 7 to 10 days have you had a cough,  shortness of breath, headache, congestion, fever (100 or greater) body aches, chills, sore throat, or sudden loss of taste or sense of smell? . Have you been around anyone with known Covid 19. . Have you been around anyone who is awaiting Covid 19 test results in the past 7 to 10 days? . Have you been around anyone who has been exposed to Covid 19, or has mentioned symptoms of Covid 19 within the past 7 to 10 days?  If you have any concerns/questions about symptoms patients report during screening (either on the phone or at threshold). Contact the provider seeing the patient or DOD for further guidance.  If neither are available contact a member of the leadership team.     Pt did not answer.

## 2019-01-22 ENCOUNTER — Telehealth: Payer: Self-pay | Admitting: Internal Medicine

## 2019-01-22 NOTE — Telephone Encounter (Signed)
Patient is also a patient of Dr. Donivan Scull- this would be more appropriately answered by Dr. Rockey Situ.  Will forward to him to see if he has any recommendations for an ophthalmologist for possible cataract surgery.

## 2019-01-22 NOTE — Telephone Encounter (Signed)
Patient calling in for a recommendation from Dr. Caryl Comes for an ophthalmologist for possible cataract surgery. Please advise

## 2019-01-23 NOTE — Telephone Encounter (Signed)
Could try the ophthalmology group on [place], large eye Dr. group

## 2019-01-24 NOTE — Telephone Encounter (Signed)
Spoke with patient and reviewed [facility] located on [address] and provided her with their number. She was very appreciative with no further questions at this time.

## 2019-02-13 NOTE — Progress Notes (Signed)
No ICM remote transmission received for 02/05/2019 and next ICM transmission scheduled for 04/30/2019.  Patient has pacemaker check in office 03/27/2019 with Dr Caryl Comes.

## 2019-02-20 ENCOUNTER — Other Ambulatory Visit: Payer: Self-pay | Admitting: Cardiovascular Disease

## 2019-02-21 NOTE — Telephone Encounter (Signed)
Refill Request.  

## 2019-02-21 NOTE — Telephone Encounter (Signed)
Pt's age 84, wt 96.2 kg, SCr 1.39, CrCl 47.39, last ov w/ SK 12/14/18.

## 2019-03-09 ENCOUNTER — Other Ambulatory Visit: Payer: Self-pay | Admitting: Cardiovascular Disease

## 2019-03-09 DIAGNOSIS — R0602 Shortness of breath: Secondary | ICD-10-CM

## 2019-03-09 DIAGNOSIS — I509 Heart failure, unspecified: Secondary | ICD-10-CM

## 2019-03-09 DIAGNOSIS — R079 Chest pain, unspecified: Secondary | ICD-10-CM

## 2019-03-09 MED ORDER — NITROGLYCERIN 0.4 MG SL SUBL: 0.4000 mg | SUBLINGUAL_TABLET | SUBLINGUAL | 1 refills | Status: DC | PRN

## 2019-03-09 NOTE — Telephone Encounter (Signed)
Requested Prescriptions   Signed Prescriptions Disp Refills   nitroGLYCERIN (NITROSTAT) 0.4 MG SL tablet 25 tablet 1    Sig: Place 1 tablet (0.4 mg total) under the tongue every 5 (five) minutes as needed for chest pain.    Authorizing Provider: [name], TIMOTHY J    Ordering User: NEWCOMER MCCLAIN, BRANDY L    

## 2019-03-09 NOTE — Telephone Encounter (Signed)
°*  STAT* If patient is at the pharmacy, call can be transferred to refill team.   1. Which medications need to be refilled? (please list name of each medication and dose if known)   Nitro 0.4 mg SL as directed   2. Which pharmacy/location (including street and city if local pharmacy) is medication to be sent to?  [facility]    3. Do they need a 30 day or 90 day supply?  [place]

## 2019-03-14 ENCOUNTER — Telehealth: Payer: Self-pay | Admitting: Internal Medicine

## 2019-03-14 NOTE — Telephone Encounter (Signed)
°  1. Has your device fired?  No   2. Is you device beeping? No   3. Are you experiencing draining or swelling at device site? No   4. Are you calling to see if we received your device transmission? No   5. Have you passed out? No    Patient says the site is burning and is painful to touch feels somewhat warm but unable to see if site is red due to vision   . She is also very nauseated all the time .      Please route to [place]

## 2019-03-14 NOTE — Telephone Encounter (Signed)
Will forward to [facility] to review prior to me calling the patient.

## 2019-03-14 NOTE — Telephone Encounter (Signed)
I called and spoke with the patient.  She states the she is having pain to the right side of her chest just above the PPM site.  She cannot tell if it is red due to cataracts, but it does feel a little warm.  She is unsure if she is having some type of neuropathy, but she also states that her SOB is worse since her new device was placed.  I have offered her an appointment to come in and see Dr. Caryl Comes tomorrow for further assessment of her device site and possible reprogramming.  The patient is agreeable and will call her daughter to confirm she can come at 10:20 am (arrive at 10:00 am in the [facility]).

## 2019-03-14 NOTE — Telephone Encounter (Signed)
Spoke with patient. She reports that she has pain in her left shoulder and in her back with movement. She reports there is no drainage or edema at PPM incision site.She reports numbness in her left hand and that she has pain in her shoulder and back when she sleeps on her left side. Remote transmission sent and no alerts or episodes recorded, pacemaker function WNL.  Instructed patient to contact PCP concerning pain with movement in left shoulder and her back. She stated she understands instructions to contact family Dr. I confirmed appointment with Dr Caryl Comes for 58 day f/u 03/27/19 at 3:[address] pm [address] office.

## 2019-03-14 NOTE — Telephone Encounter (Signed)
New Message     Patient calling back about issue please give her a call.

## 2019-03-14 NOTE — Telephone Encounter (Signed)
Patient also has sob and wants to know if we can send in something for pain

## 2019-03-15 ENCOUNTER — Ambulatory Visit (INDEPENDENT_AMBULATORY_CARE_PROVIDER_SITE_OTHER): Payer: Medicare Other | Admitting: Internal Medicine

## 2019-03-15 ENCOUNTER — Other Ambulatory Visit: Payer: Self-pay

## 2019-03-15 ENCOUNTER — Encounter: Payer: Self-pay | Admitting: Internal Medicine

## 2019-03-15 VITALS — BP 130/80 | HR 77 | Ht 62.0 in | Wt 205.8 lb

## 2019-03-15 DIAGNOSIS — I442 Atrioventricular block, complete: Secondary | ICD-10-CM

## 2019-03-15 DIAGNOSIS — I4821 Permanent atrial fibrillation: Secondary | ICD-10-CM | POA: Diagnosis not present

## 2019-03-15 DIAGNOSIS — Z950 Presence of cardiac pacemaker: Secondary | ICD-10-CM | POA: Diagnosis not present

## 2019-03-15 MED ORDER — NYSTATIN 100000 UNIT/GM EX POWD: Freq: Four times a day (QID) | CUTANEOUS | 0 refills | Status: DC

## 2019-03-15 NOTE — Progress Notes (Signed)
Patient Care Team: [name], MD as PCP - General (Family Medicine) Rockey Situ [name], MD as Consulting Physician (Cardiology)   HPI  Jacqueline Lozano is a 84 y.o. female seen in followup for pacemaker implantation for tachybradycardia syndrome. She has a history of permanent atrial fibrillation and status post CRT P. she is status post AV junction ablation  LV lead has been permanently turned off because of diaphragmatic stimulation  Underwent generator replacement 7/20.  Called in recently because of pain near her device site.    Functional status is stable.  Denies chest discomfort.  No edema.  Chronic shortness of breath but unchanged.  D.   DATE TEST EF   2/16    Echo   EF 65 % Mild AS Grad 11  10/18    Myoview   EF 70 % No ischemia  2/19 Echo  65-70% Mild AS grad 6    Date Cr Hgb  4/18 1.4 12.2   1/19 1.36 12.8  4/19 1.4 13.0  6/20 1.4 12.9    She is complaining of pain around her device site with some burning.  She also has a rash underneath her arm showed up after the procedure resolved and it has recurred.  Also with SOB  Worse over the last 2-3 weeks  No edema or abd swelling     Past Medical History:  Diagnosis Date  . Abnormal echocardiogram 09/2009   EF >55%, mild LVH, moderate LAE, normal RV, normal PA systolic pressure  . Allergic to IV contrast   . Chronic diastolic CHF (congestive heart failure) ([facility])   . Complete heart block-S./P. AV junction ablation 11/23/2010  . GERD (gastroesophageal reflux disease)   . Hyperlipidemia    Has been unable to take statins due to muscle pain. Has tried multiple statins per her report.  . Hypertension   . Hypothyroidism   . Migraine   . OSA (obstructive sleep apnea)    Not consistently using CPAP  . Pacemaker-Medtronic CRT P. 11/23/2010   Atrial port was plugged   . PAD (peripheral artery disease) ([facility])    Occlusive disease involving left PT and AT  . Paroxysmal atrial fibrillation ([facility])     Breakthrough a fib on sotalol, changed to amiodarone 4/11. DCCV 4/11    Past Surgical History:  Procedure Laterality Date  . APPENDECTOMY    . ATRIAL ABLATION SURGERY  06/12/2010  . BIV PACEMAKER GENERATOR CHANGEOUT N/A 12/22/2018   Procedure: BIV PACEMAKER GENERATOR CHANGEOUT;  Surgeon: Deboraha Sprang, MD;  Location: [facility];  Service: Cardiovascular;  Laterality: N/A;  . CARDIAC CATHETERIZATION     Left heart cath x3 in teh past, last 5 yrs ago. All "normal" per pt report  . CARDIOVERSION    . CARPAL TUNNEL RELEASE    . CHOLECYSTECTOMY OPEN    . COLONOSCOPY WITH PROPOFOL N/A 09/08/2015   Procedure: COLONOSCOPY WITH PROPOFOL;  Surgeon: Josefine Class, MD;  Location: [facility];  Service: Endoscopy;  Laterality: N/A;  . ESOPHAGOGASTRODUODENOSCOPY (EGD) WITH PROPOFOL N/A 09/08/2015   Procedure: ESOPHAGOGASTRODUODENOSCOPY (EGD) WITH PROPOFOL;  Surgeon: Josefine Class, MD;  Location: [facility]COPY;  Service: Endoscopy;  Laterality: N/A;  . PACEMAKER INSERTION  06/12/2010  . VESICOVAGINAL FISTULA CLOSURE W/ TAH      Current Outpatient Medications  Medication Sig Dispense Refill  . ELIQUIS 5 MG TABS tablet Take 1 tablet by mouth twice daily 180 tablet 1  . famotidine (PEPCID) 10  MG tablet Take 10 mg by mouth daily as needed for heartburn or indigestion. Reported on 08/19/2015    . GuaiFENesin (MUCINEX PO) Take 1 tablet by mouth 2 (two) times daily as needed (cold symptoms).     Marland Kitchen ibuprofen (ADVIL) 200 MG tablet Take 200 mg by mouth every 6 (six) hours as needed for headache or mild pain.    Marland Kitchen KLOR-CON M10 10 MEQ tablet Take 1 tablet by mouth once daily (Patient taking differently: Take 10 mEq by mouth daily. ) 90 tablet 2  . levothyroxine (SYNTHROID) 75 MCG tablet Take 75 mcg by mouth daily before breakfast.    . meclizine (ANTIVERT) 25 MG tablet TAKE ONE TABLET BY MOUTH THREE TIMES DAILY AS NEEDED (Patient taking differently: Take 25 mg by mouth 3 (three) times daily as  needed for dizziness. ) 90 tablet 1  . metoprolol tartrate (LOPRESSOR) 50 MG tablet TAKE 1 & 1/2 (ONE & ONE-HALF) TABLETS BY MOUTH TWICE DAILY (Patient taking differently: Take 75 mg by mouth 2 (two) times daily. ) 270 tablet 1  . nitroGLYCERIN (NITROSTAT) 0.4 MG SL tablet Place 1 tablet (0.4 mg total) under the tongue every 5 (five) minutes as needed for chest pain. 25 tablet 1  . prochlorperazine (COMPAZINE) 5 MG tablet Take 1 tablet (5 mg total) by mouth every 6 (six) hours as needed. (Patient taking differently: Take 5 mg by mouth every 6 (six) hours as needed for nausea or vomiting. ) 90 tablet 5  . VITAMIN B1-B12 IM Inject into the muscle every 30 (thirty) days.    . furosemide (LASIX) 20 MG tablet Take 1 tablet (20 mg total) by mouth as directed. Take once daily with extra dose after lunch if you have leg swelling. 180 tablet 3   No current facility-administered medications for this visit.     Allergies  Allergen Reactions  . Codeine Shortness Of Breath  . Iodinated Diagnostic Agents Shortness Of Breath and Other (See Comments)  . Morphine Shortness Of Breath  . Other Shortness Of Breath, Nausea And Vomiting and Other (See Comments)    Contrast dye  . Oxycodone Shortness Of Breath  . Shellfish-Derived Products Shortness Of Breath, Nausea And Vomiting and Rash  . Dipyridamole Other (See Comments)    Unknown   . Statins Other (See Comments)    Unknown  . Sulfonamide Derivatives Other (See Comments)    Unknown  . Amlodipine Rash and Other (See Comments)    Review of Systems negative except from HPI and PMH  Physical Exam BP 130/80 (BP Location: Left Arm, Patient Position: Sitting, Cuff Size: Normal)   Pulse 77   Ht 5\' 2"  (1.575 m)   Wt 205 lb 12 oz (93.3 kg)   SpO2 95%   BMI 37.63 kg/m  Well developed and well nourished in no acute distress HENT normal Neck supple with JVP-flat Clear Device pocket well healed; without hematoma or erythema.  There is no tethering   Sensitive to touch Regular rate and rhythm, no   murmur Abd-soft with active BS No Clubbing cyanosis no edema Skin-warm and dry  erythemtous rash isolated under L axilla not connected to the device pocket  A & Oriented  Grossly normal sensory and motor function  ECG V pacing @ 77 Atrial fib  Assessment and  Plan  HFpEF  Atrial fibrillation-permanent  AV junction ablation-complete heart block  Pacemaker-CRT   LV lead inactivated secondary to diaphragmatic stimulation    Pacemaker site tenderness  Aortic stenosis-mild  Hypertension  Axillary yeast infection     Euvolemic continue current meds  But optiovol is up a little bit   Will increase her furosemide 20>>40 daily x  3 d  Nystatin powder for rash  No erythema or warmth to the pacemaker incision.  Significant tenderness along the incision.  Doubt infection  F/u in 3/4 year       No report for for 5 months related to the question of

## 2019-03-15 NOTE — Patient Instructions (Addendum)
Medication Instructions:  - Your physician has recommended you make the following change in your medication:   1) Apply nystatin powder 4 times a day as needed to the affected area  2) Increase lasix (furosemide) 20 mg- take 2 tablets (40 mg) by mouth once daily x 3 days, then resume how you are currently taking this  If you need a refill on your cardiac medications before your next appointment, please call your pharmacy.   Lab work: - none ordered  If you have labs (blood work) drawn today and your tests are completely normal, you will receive your results only by: Marland Kitchen MyChart Message (if you have MyChart) OR . A paper copy in the mail If you have any lab test that is abnormal or we need to change your treatment, we will call you to review the results.  Testing/Procedures: - none ordered  Follow-Up: At [facility], you and your health needs are our priority.  As part of our continuing mission to provide you with exceptional heart care, we have created designated Provider Care Teams.  These Care Teams include your primary Cardiologist (physician) and Advanced Practice Providers (APPs -  Physician Assistants and Nurse Practitioners) who all work together to provide you with the care you need, when you need it.  - You will need a follow up appointment in 9 months (July 2021) with Dr. Caryl Comes.  - Please call our office 2 months in advance to schedule this appointment. (Call in early May 2021 to schedule)   - Remote monitoring is used to monitor your Pacemaker of ICD from home. This monitoring reduces the number of office visits required to check your device to one time per year. It allows [place] to keep an eye on the functioning of your device to ensure it is working properly. You are scheduled for a device check from home on 04/30/19. You may send your transmission at any time that day. If you have a wireless device, the transmission will be sent automatically. After your physician reviews your  transmission, you will receive a postcard with your next transmission date.  Any Other Special Instructions Will Be Listed Below (If Applicable). - N/A

## 2019-03-26 ENCOUNTER — Other Ambulatory Visit: Payer: Self-pay | Admitting: Internal Medicine

## 2019-03-26 NOTE — Telephone Encounter (Signed)
Please review for refill on Nystatin Powder. Dr. Caryl Comes gave at her last office visit.

## 2019-03-26 NOTE — Telephone Encounter (Signed)
Further refills will need to come from her PCP. Thanks!

## 2019-03-27 ENCOUNTER — Encounter: Payer: Medicare Other | Admitting: Internal Medicine

## 2019-03-27 ENCOUNTER — Ambulatory Visit (INDEPENDENT_AMBULATORY_CARE_PROVIDER_SITE_OTHER): Payer: Medicare Other | Admitting: *Deleted

## 2019-03-27 DIAGNOSIS — I5032 Chronic diastolic (congestive) heart failure: Secondary | ICD-10-CM

## 2019-03-27 DIAGNOSIS — I4821 Permanent atrial fibrillation: Secondary | ICD-10-CM

## 2019-03-28 ENCOUNTER — Ambulatory Visit (INDEPENDENT_AMBULATORY_CARE_PROVIDER_SITE_OTHER): Payer: Medicare Other

## 2019-03-28 DIAGNOSIS — I5032 Chronic diastolic (congestive) heart failure: Secondary | ICD-10-CM

## 2019-03-28 DIAGNOSIS — Z950 Presence of cardiac pacemaker: Secondary | ICD-10-CM

## 2019-03-28 LAB — CUP PACEART REMOTE DEVICE CHECK
Battery Remaining Longevity: 134 mo
Battery Voltage: 3.17 V
Brady Statistic AP VP Percent: 0 %
Brady Statistic AP VS Percent: 0 %
Brady Statistic AS VP Percent: 99.5 %
Brady Statistic AS VS Percent: 0.5 %
Brady Statistic RA Percent Paced: 0 %
Brady Statistic RV Percent Paced: 99.5 %
Date Time Interrogation Session: 20201020102042
Implantable Lead Implant Date: 20120106
Implantable Lead Implant Date: 20120106
Implantable Lead Location: 753858
Implantable Lead Location: 753860
Implantable Lead Model: 5076
Implantable Pulse Generator Implant Date: 20200717
Lead Channel Impedance Value: 3306 Ohm
Lead Channel Impedance Value: 3306 Ohm
Lead Channel Impedance Value: 399 Ohm
Lead Channel Impedance Value: 456 Ohm
Lead Channel Impedance Value: 513 Ohm
Lead Channel Impedance Value: 570 Ohm
Lead Channel Impedance Value: 627 Ohm
Lead Channel Impedance Value: 703 Ohm
Lead Channel Impedance Value: 931 Ohm
Lead Channel Pacing Threshold Amplitude: 0.75 V
Lead Channel Pacing Threshold Pulse Width: 0.4 ms
Lead Channel Setting Pacing Amplitude: 2.5 V
Lead Channel Setting Pacing Pulse Width: 0.4 ms
Lead Channel Setting Sensing Sensitivity: 2.8 mV

## 2019-03-30 NOTE — Progress Notes (Signed)
EPIC Encounter for ICM Monitoring  Patient Name: Jacqueline Lozano is a 84 y.o. female Date: 03/30/2019 Primary Care Physican: [name], MD Primary Cardiologist:[name] Electrophysiologist: Vergie Living Pacing: 99.5% 11/22/2018 Weight: unknown  Transmission reviewed.   Optivol Thoracic impedancenormal.  Prescribed:Furosemide 20 mg Take 1 tablet (20 mg total) by mouth as directed. Take once daily with extra dose after lunch if you have leg swelling. Klor Con 10 mEq 1 tablet daily  Recommendations: None.  Follow-up plan: ICM clinic phone appointment on 04/30/2019.   91 day device clinic remote transmission 06/28/2019.    Copy of ICM check sent to Dr. Caryl Comes.   3 month ICM trend: 03/27/2019    1 Year ICM trend:       Rosalene Billings, RN 03/30/2019 4:50 PM

## 2019-04-01 ENCOUNTER — Other Ambulatory Visit: Payer: Self-pay | Admitting: Internal Medicine

## 2019-04-04 NOTE — Telephone Encounter (Signed)
Dr. Caryl Comes gave a Rx for the Nystatin powder on Oct. 8, 2020. Please review if okay to refill.

## 2019-04-12 NOTE — Progress Notes (Signed)
Remote pacemaker transmission.   

## 2019-04-30 ENCOUNTER — Ambulatory Visit (INDEPENDENT_AMBULATORY_CARE_PROVIDER_SITE_OTHER): Payer: Medicare Other

## 2019-04-30 DIAGNOSIS — Z950 Presence of cardiac pacemaker: Secondary | ICD-10-CM | POA: Diagnosis not present

## 2019-04-30 DIAGNOSIS — I5032 Chronic diastolic (congestive) heart failure: Secondary | ICD-10-CM

## 2019-05-02 ENCOUNTER — Telehealth: Payer: Self-pay

## 2019-05-02 NOTE — Telephone Encounter (Signed)
Remote ICM transmission received.  Attempted call to patient regarding ICM remote transmission and left detailed message per DPR.  Advised to return call for any fluid symptoms or questions. Next ICM remote transmission scheduled 06/11/2019.     

## 2019-05-02 NOTE — Progress Notes (Signed)
EPIC Encounter for ICM Monitoring  Patient Name: HEAVIN FALKENHAGEN is a 84 y.o. female Date: 05/02/2019 Primary Care Physican: [name], MD Primary Cardiologist:[name] Electrophysiologist: Vergie Living Pacing: 99.4% LastWeight: unknown  Attempted call to patient and unable to reach.  Left detailed message per DPR regarding transmission. Transmission reviewed.   Optivol Thoracic impedancenormal.  Prescribed:Furosemide 20 mg Take 1 tablet (20 mg total) by mouth as directed. Take once daily with extra dose after lunch if you have leg swelling. Klor Con 10 mEq 1 tablet daily  Recommendations: Left voice mail with ICM number and encouraged to call if experiencing any fluid symptoms.  Follow-up plan: ICM clinic phone appointment on 06/11/2019.   91 day device clinic remote transmission 06/28/2019.    Copy of ICM check sent to Dr. Caryl Comes.   3 month ICM trend: 04/30/2019    1 Year ICM trend:       Rosalene Billings, RN 05/02/2019 11:30 AM

## 2019-05-24 ENCOUNTER — Other Ambulatory Visit: Payer: Self-pay | Admitting: Cardiovascular Disease

## 2019-06-11 ENCOUNTER — Ambulatory Visit (INDEPENDENT_AMBULATORY_CARE_PROVIDER_SITE_OTHER): Payer: Medicare Other

## 2019-06-11 DIAGNOSIS — I5032 Chronic diastolic (congestive) heart failure: Secondary | ICD-10-CM

## 2019-06-11 DIAGNOSIS — Z950 Presence of cardiac pacemaker: Secondary | ICD-10-CM | POA: Diagnosis not present

## 2019-06-12 ENCOUNTER — Telehealth: Payer: Self-pay

## 2019-06-12 NOTE — Telephone Encounter (Signed)
Remote ICM transmission received.  Attempted call to patient regarding ICM remote transmission and left detailed message per DPR to return call.  Advised to return call for any fluid symptoms or questions.      

## 2019-06-12 NOTE — Progress Notes (Signed)
EPIC Encounter for ICM Monitoring  Patient Name: Jacqueline Lozano is a 85 y.o. female Date: 06/12/2019 Primary Care Physican: [name], MD Primary Cardiologist:[name] Electrophysiologist: Vergie Living Pacing: 99.4% LastWeight: unknown  Attempted call to patient and unable to reach.  Left detailed message per DPR regarding transmission. Transmission reviewed.   OptivolThoracic impedancesuggesting possible fluid accumulations since 05/17/2019 with exception of one day at baseline.  Prescribed:Furosemide 20 mgTake 1 tablet (20 mg total) by mouth as directed. Take once daily with extra dose after lunch if you have leg swelling.Klor Con 10 mEq 1 tablet daily  Recommendations: Left voice mail with ICM number and encouraged to call if experiencing any fluid symptoms.  If patient reached will advise to take extra Furosemide dose if needed.  Follow-up plan: ICM clinic phone appointment on1/04/2020 to recheck fluid levels. 91 day device clinic remote transmission 06/28/2019.   Copy of ICM check sent to [place].   3 month ICM trend: 06/11/2019    1 Year ICM trend:       Rosalene Billings, RN 06/12/2019 8:42 AM

## 2019-06-15 NOTE — Progress Notes (Signed)
Attempted call back to patient as requested by voice mail message.

## 2019-06-18 ENCOUNTER — Ambulatory Visit (INDEPENDENT_AMBULATORY_CARE_PROVIDER_SITE_OTHER): Payer: Medicare Other

## 2019-06-18 DIAGNOSIS — I5032 Chronic diastolic (congestive) heart failure: Secondary | ICD-10-CM | POA: Diagnosis not present

## 2019-06-18 DIAGNOSIS — Z950 Presence of cardiac pacemaker: Secondary | ICD-10-CM

## 2019-06-18 NOTE — Progress Notes (Signed)
EPIC Encounter for ICM Monitoring  Patient Name: Jacqueline Lozano is a 85 y.o. female Date: 06/18/2019 Primary Care Physican: [name], MD Primary Cardiologist:[name] Electrophysiologist: Vergie Living Pacing: 99.9% LastWeight: unknown  Spoke with patient and she has been Jacqueline Lozano of breath and tightness in feet for the past several weeks.   OptivolThoracic impedancesuggesting possible fluid accumulations since 05/17/2019 with exception of one day at baseline.  Prescribed:Furosemide 20 mgTake 1 tablet (20 mg total) by mouth as directed. Take once daily with extra dose after lunch if you have leg swelling.Klor Con 10 mEq 1 tablet daily  Recommendations:   Advised to take extra Furosemide tablet as prescribed x 3 days and extra Potassium tablet x 3 days.  After 3rd day, return to prescribed dosage of Lasix 1 tablet daily and Klor Con 1 tablet daily.  Follow-up plan: ICM clinic phone appointment on 06/22/2019 to recheck fluid levels.   91 day device clinic remote transmission 06/28/2019.     Copy of ICM check sent to Dr. Caryl Comes and Dr Rockey Situ.   3 month ICM trend: 06/18/2019    1 Year ICM trend:       Rosalene Billings, RN 06/18/2019 3:14 PM

## 2019-06-22 ENCOUNTER — Ambulatory Visit (INDEPENDENT_AMBULATORY_CARE_PROVIDER_SITE_OTHER): Payer: Medicare Other

## 2019-06-22 DIAGNOSIS — Z950 Presence of cardiac pacemaker: Secondary | ICD-10-CM

## 2019-06-22 DIAGNOSIS — I5032 Chronic diastolic (congestive) heart failure: Secondary | ICD-10-CM

## 2019-06-22 NOTE — Progress Notes (Signed)
EPIC Encounter for ICM Monitoring  Patient Name: Jacqueline Lozano is a 85 y.o. female Date: 06/22/2019 Primary Care Physican: [name], MD Primary Cardiologist:[name] Electrophysiologist: Vergie Living Pacing: 99.9% LastWeight: unknown  Spoke with patient and tightness of ankle has been resolved but no change in shortness of breathing.   OptivolThoracic impedancereturned to normal after taking extra Furosemide.    Prescribed:Furosemide 20 mgTake 1 tablet (20 mg total) by mouth as directed. Take once daily with extra dose after lunch if you have leg swelling.Klor Con 10 mEq 1 tablet daily  Recommendations:   No changes and encouraged to call if experiencing any fluid symptoms.  Follow-up plan: ICM clinic phone appointment on 07/16/2019 to recheck fluid levels.   91 day device clinic remote transmission 06/28/2019.     Copy of ICM check sent to Dr. Caryl Comes.    3 month ICM trend: 06/22/2019    1 Year ICM trend:       Rosalene Billings, RN 06/22/2019 2:10 PM

## 2019-06-28 ENCOUNTER — Ambulatory Visit (INDEPENDENT_AMBULATORY_CARE_PROVIDER_SITE_OTHER): Payer: Medicare Other | Admitting: *Deleted

## 2019-06-28 DIAGNOSIS — I5032 Chronic diastolic (congestive) heart failure: Secondary | ICD-10-CM | POA: Diagnosis not present

## 2019-06-28 LAB — CUP PACEART REMOTE DEVICE CHECK
Battery Remaining Longevity: 131 mo
Battery Voltage: 3.12 V
Brady Statistic AP VP Percent: 0 %
Brady Statistic AP VS Percent: 0 %
Brady Statistic AS VP Percent: 99.45 %
Brady Statistic AS VS Percent: 0.55 %
Brady Statistic RA Percent Paced: 0 %
Brady Statistic RV Percent Paced: 99.45 %
Date Time Interrogation Session: 20210121005439
Implantable Lead Implant Date: 20120106
Implantable Lead Implant Date: 20120106
Implantable Lead Location: 753858
Implantable Lead Location: 753860
Implantable Lead Model: 5076
Implantable Pulse Generator Implant Date: 20200717
Lead Channel Impedance Value: 399 Ohm
Lead Channel Impedance Value: 456 Ohm
Lead Channel Impedance Value: 456 Ohm
Lead Channel Impedance Value: 551 Ohm
Lead Channel Impedance Value: 589 Ohm
Lead Channel Impedance Value: 608 Ohm
Lead Channel Impedance Value: 798 Ohm
Lead Channel Pacing Threshold Amplitude: 0.625 V
Lead Channel Pacing Threshold Pulse Width: 0.4 ms
Lead Channel Setting Pacing Amplitude: 2.5 V
Lead Channel Setting Pacing Pulse Width: 0.4 ms
Lead Channel Setting Sensing Sensitivity: 2.8 mV

## 2019-07-25 NOTE — Progress Notes (Signed)
No ICM remote transmission received for 07/16/2019 and next ICM transmission scheduled for 07/30/2019.   

## 2019-07-30 ENCOUNTER — Ambulatory Visit (INDEPENDENT_AMBULATORY_CARE_PROVIDER_SITE_OTHER): Payer: Medicare Other

## 2019-07-30 DIAGNOSIS — I5032 Chronic diastolic (congestive) heart failure: Secondary | ICD-10-CM

## 2019-07-30 DIAGNOSIS — Z950 Presence of cardiac pacemaker: Secondary | ICD-10-CM | POA: Diagnosis not present

## 2019-08-01 ENCOUNTER — Telehealth: Payer: Self-pay | Admitting: Internal Medicine

## 2019-08-01 NOTE — Telephone Encounter (Signed)
Patient states she is inquiring about having a skin test done to find out whether or not she is eligible to have the COVID-19 vaccination or not. Patient states she is concerned she may be allergic to the vaccination. Please call to advise.

## 2019-08-01 NOTE — Telephone Encounter (Signed)
I spoke to the patient who called asking about a skin test to identify a possible allergy to the CoVid vaccine.    I told her that she would have to reach out to her PCP or call [phone] for further information.  She verbalized understanding.

## 2019-08-03 ENCOUNTER — Telehealth: Payer: Self-pay

## 2019-08-03 NOTE — Progress Notes (Signed)
EPIC Encounter for ICM Monitoring  Patient Name: RINKI JURKOWSKI is a 85 y.o. female Date: 08/03/2019 Primary Care Physican: [name], MD Primary Cardiologist:[name] Electrophysiologist: Vergie Living Pacing: 99.2% LastWeight: unknown  Attempted call to patient and unable to reach.  Left detailed message per DPR regarding transmission. Transmission reviewed.   OptivolThoracic impedancenormal.    Prescribed:Furosemide 20 mgTake 1 tablet (20 mg total) by mouth as directed. Take once daily with extra dose after lunch if you have leg swelling.Klor Con 10 mEq 1 tablet daily  Recommendations:Left voice mail with ICM number and encouraged to call if experiencing any fluid symptoms.  Follow-up plan: ICM clinic phone appointment on3/29/2021. 91 day device clinic remote transmission 09/27/2019.   Copy of ICM check sent to [place].    3 month ICM trend: 07/30/2019    1 Year ICM trend:       Rosalene Billings, RN 08/03/2019 2:18 PM

## 2019-08-03 NOTE — Telephone Encounter (Signed)
Remote ICM transmission received.  Attempted call to patient regarding ICM remote transmission and left detailed message per DPR.  Advised to return call for any fluid symptoms or questions. Next ICM remote transmission scheduled 09/03/2019.

## 2019-08-17 ENCOUNTER — Other Ambulatory Visit: Payer: Self-pay | Admitting: Internal Medicine

## 2019-08-17 NOTE — Telephone Encounter (Signed)
Pt last saw Dr Caryl Comes 03/15/19, last labs 12/19/18 Creat 1.39, age 85, weight 93.3kg, based on specified criteria pt is on appropriate dosage of Eliquis 5mg  BID.  Will refill rx.

## 2019-09-03 ENCOUNTER — Ambulatory Visit (INDEPENDENT_AMBULATORY_CARE_PROVIDER_SITE_OTHER): Payer: Medicare Other

## 2019-09-03 DIAGNOSIS — Z950 Presence of cardiac pacemaker: Secondary | ICD-10-CM

## 2019-09-03 DIAGNOSIS — I5032 Chronic diastolic (congestive) heart failure: Secondary | ICD-10-CM

## 2019-09-04 NOTE — Progress Notes (Signed)
EPIC Encounter for ICM Monitoring  Patient Name: DORATHA DISCALA is a 86 y.o. female Date: 09/04/2019 Primary Care Physican: [name], MD Primary Cardiologist:[name] Electrophysiologist: Vergie Living Pacing: 99.5% LastWeight: unknown  Spoke with patient and she is doing well.    Optivolthoracic impedancenormal.  Prescribed:Furosemide 20 mgTake 1 tablet (20 mg total) by mouth as directed.  Take once daily with extra dose after lunch if you have leg swelling.Klor Con 10 mEq 1 tablet daily  Recommendations: No changes and encouraged to call if experiencing any fluid symptoms.  Follow-up plan: ICM clinic phone appointment on5/08/2019. 91 day device clinic remote transmission 09/27/2019. Office visit with Dr Rockey Situ on 09/25/2019.  Copy of ICM check sent to [place].  3 month ICM trend: 09/03/2019    1 Year ICM trend:       Rosalene Billings, RN 09/04/2019 12:51 PM

## 2019-09-24 NOTE — Progress Notes (Signed)
Date:  09/25/2019   ID:  Jacqueline Lozano, DOB 1934-11-01, MRN [id number]  Patient Location:  [place] [address]   Provider location:   [facility], [place] office  PCP:  [name], MD  Cardiologist:  [facility]  Chief Complaint  Patient presents with  . other    12 month follow up. Meds reviewed by the pt. verbally. Pt. c/o shortness of breath with occas. spells of A-fib.     History of Present Illness:    Jacqueline Lozano is a 85 y.o. female past medical history of morbid obesity, deconditioning Chronic bilateral knee pain who has declined joint surgery Permanent atrial fibrillation,  status post arteriovenous node ablation, Pacer/CRT-P, ventricularly paced Hypertension,  Hypothyroidism,  Diastolic congestive heart failure,  C. difficile infection,  Echo 07/2014: mild AS Carotid: plaque on the right in 2014 Prior PFT's shows restrictive lung disease likely from her obseity Chronic shortness of breath multifactorial including AV node ablation, paced rhythm, old obesity, deconditioning, restrictive lung disease, aortic valve stenosis who presents for followup of her chronic shortness of breath, pacemaker, atrial fibrillation   In follow-up today she presents with her daughter She has been having problems with her teeth, lost the upper row, not eating well Prior Weight 225, now down to 196 Reports with weight loss that has not affected her breathing, still very short of breath  Scheduled for cataract surgery  Anxious about getting vaccine Has significant allergies  Pacer battery change out july 2020  Lab work reviewed CR 1.39, BUN 26  Long discussion with her family in the room  Spends much of the time in the bedroom, has a small kitchenette in her bedroom, gets her coffee and cake for breakfast Sometimes comes out of the room into the kitchen area Does not walk much at all at home  Denies any leg swelling  abdominal bloating Uses a walker  Statin intolerance  EKG personally reviewed by myself on todays visit Shows paced rhythm  Rate response previously tested in the office, was appropriate  hit 98 bpm walking down the hall, no drop in oxygen saturation, maintained for the 95%. She was very short of breath, had difficulty walking back to the room, had to walk very slowly, take breaks  Unable to exercise Unable to participate in pulmonary rehabilitation given severe chronic knee pain  Stress test 03/2017: normal study   Prior CV studies:   The following studies were reviewed today:  Previous echocardiogram 2016 showed mild to moderate aortic valve stenosis, normal EF, normal right heart pressures  echocardiogram shows normal LV function, stable valve disease, normal right ventricular systolic pressures.  Previous visit to the emergency room for atypical chest pain. Cardiac enzymes were negative and she was discharged home Prior workup with echocardiogram in 2014 and stress test were unrevealing.  sleep study in October and again in November. Sleep study in October suggested she repeat the test as it was incomplete. The note indicates the study was discontinued as the patient had heart block, with limited EKG monitoring appearing to demonstrate a 43 second period of complete heart block with ventricular standstill. Atrial rate was 80 beats per minute. Note indicates the patient was awake and alert during the event with some dizziness. Pacer was checked after this event, Normal functioning device.  Followup sleep study 04/24/2013 recommended weight loss, though there was no indications for CPAP at this time. Previous stress test early 2014 showed  no ischemia    Past Medical History:  Diagnosis Date  . Abnormal echocardiogram 09/2009   EF >55%, mild LVH, moderate LAE, normal RV, normal PA systolic pressure  . Allergic to IV contrast   . Chronic diastolic CHF (congestive  heart failure) ([place])   . Complete heart block-S./P. AV junction ablation 11/23/2010  . GERD (gastroesophageal reflux disease)   . Hyperlipidemia    Has been unable to take statins due to muscle pain. Has tried multiple statins per her report.  . Hypertension   . Hypothyroidism   . Migraine   . OSA (obstructive sleep apnea)    Not consistently using CPAP  . Pacemaker-Medtronic CRT P. 11/23/2010   Atrial port was plugged   . PAD (peripheral artery disease) ([facility])    Occlusive disease involving left PT and AT  . Paroxysmal atrial fibrillation ([facility])    Breakthrough a fib on sotalol, changed to amiodarone 4/11. DCCV 4/11   Past Surgical History:  Procedure Laterality Date  . APPENDECTOMY    . ATRIAL ABLATION SURGERY  06/12/2010  . BIV PACEMAKER GENERATOR CHANGEOUT N/A 12/22/2018   Procedure: BIV PACEMAKER GENERATOR CHANGEOUT;  Surgeon: Deboraha Sprang, MD;  Location: [facility];  Service: Cardiovascular;  Laterality: N/A;  . CARDIAC CATHETERIZATION     Left heart cath x3 in teh past, last 5 yrs ago. All "normal" per pt report  . CARDIOVERSION    . CARPAL TUNNEL RELEASE    . CHOLECYSTECTOMY OPEN    . COLONOSCOPY WITH PROPOFOL N/A 09/08/2015   Procedure: COLONOSCOPY WITH PROPOFOL;  Surgeon: Josefine Class, MD;  Location[facility];  Service: Endoscopy;  Laterality: N/A;  . ESOPHAGOGASTRODUODENOSCOPY (EGD) WITH PROPOFOL N/A 09/08/2015   Procedure: ESOPHAGOGASTRODUODENOSCOPY (EGD) WITH PROPOFOL;  Surgeon: Josefine Class, MD;  Location: [facility] ENDOSCOPY;  Service: Endoscopy;  Laterality: N/A;  . PACEMAKER INSERTION  06/12/2010  . VESICOVAGINAL FISTULA CLOSURE W/ TAH       Current Meds  Medication Sig  . cyanocobalamin (,VITAMIN B-12,) 1000 MCG/ML injection Inject into the muscle.  . diphenoxylate-atropine (LOMOTIL) 2.5-0.025 MG tablet Take by mouth.  Arne Cleveland 5 MG TABS tablet Take 1 tablet by mouth twice daily  . famotidine (PEPCID) 10 MG tablet Take 10 mg by mouth daily  as needed for heartburn or indigestion. Reported on 08/19/2015  . furosemide (LASIX) 20 MG tablet Take 1 tablet (20 mg total) by mouth as directed. Take once daily with extra dose after lunch if you have leg swelling.  . GuaiFENesin (MUCINEX PO) Take 1 tablet by mouth 2 (two) times daily as needed (cold symptoms).   Marland Kitchen ibuprofen (ADVIL) 200 MG tablet Take 200 mg by mouth every 6 (six) hours as needed for headache or mild pain.  Marland Kitchen levothyroxine (SYNTHROID) 75 MCG tablet Take 75 mcg by mouth daily before breakfast.  . meclizine (ANTIVERT) 25 MG tablet TAKE ONE TABLET BY MOUTH THREE TIMES DAILY AS NEEDED (Patient taking differently: Take 25 mg by mouth 3 (three) times daily as needed for dizziness. )  . metoprolol tartrate (LOPRESSOR) 50 MG tablet TAKE 1 & 1/2 (ONE & ONE-HALF) TABLETS BY MOUTH TWICE DAILY  . nitroGLYCERIN (NITROSTAT) 0.4 MG SL tablet Place 1 tablet (0.4 mg total) under the tongue every 5 (five) minutes as needed for chest pain.  . potassium chloride (KLOR-CON) 10 MEQ tablet Take 1 tablet (10 mEq total) by mouth daily.  . prochlorperazine (COMPAZINE) 5 MG tablet Take 1 tablet (5 mg total) by mouth  every 6 (six) hours as needed. (Patient taking differently: Take 5 mg by mouth every 6 (six) hours as needed for nausea or vomiting. )  . VITAMIN B1-B12 IM Inject into the muscle every 30 (thirty) days.     Allergies:   Codeine, Iodinated diagnostic agents, Morphine, Other, Oxycodone, Shellfish-derived products, Dipyridamole, Statins, Sulfonamide derivatives, and Amlodipine   Social History   Tobacco Use  . Smoking status: Never Smoker  . Smokeless tobacco: Never Used  . Tobacco comment: pos smoke exposure through father and spouse who smoked in the home with her for many years  Substance Use Topics  . Alcohol use: No  . Drug use: No     Current Outpatient Medications on File Prior to Visit  Medication Sig Dispense Refill  . cyanocobalamin (,VITAMIN B-12,) 1000 MCG/ML injection Inject  into the muscle.    . diphenoxylate-atropine (LOMOTIL) 2.5-0.025 MG tablet Take by mouth.    Arne Cleveland 5 MG TABS tablet Take 1 tablet by mouth twice daily 180 tablet 1  . famotidine (PEPCID) 10 MG tablet Take 10 mg by mouth daily as needed for heartburn or indigestion. Reported on 08/19/2015    . furosemide (LASIX) 20 MG tablet Take 1 tablet (20 mg total) by mouth as directed. Take once daily with extra dose after lunch if you have leg swelling. 180 tablet 3  . GuaiFENesin (MUCINEX PO) Take 1 tablet by mouth 2 (two) times daily as needed (cold symptoms).     Marland Kitchen ibuprofen (ADVIL) 200 MG tablet Take 200 mg by mouth every 6 (six) hours as needed for headache or mild pain.    Marland Kitchen levothyroxine (SYNTHROID) 75 MCG tablet Take 75 mcg by mouth daily before breakfast.    . meclizine (ANTIVERT) 25 MG tablet TAKE ONE TABLET BY MOUTH THREE TIMES DAILY AS NEEDED (Patient taking differently: Take 25 mg by mouth 3 (three) times daily as needed for dizziness. ) 90 tablet 1  . metoprolol tartrate (LOPRESSOR) 50 MG tablet TAKE 1 & 1/2 (ONE & ONE-HALF) TABLETS BY MOUTH TWICE DAILY 270 tablet 3  . nitroGLYCERIN (NITROSTAT) 0.4 MG SL tablet Place 1 tablet (0.4 mg total) under the tongue every 5 (five) minutes as needed for chest pain. 25 tablet 1  . potassium chloride (KLOR-CON) 10 MEQ tablet Take 1 tablet (10 mEq total) by mouth daily. 180 tablet 3  . prochlorperazine (COMPAZINE) 5 MG tablet Take 1 tablet (5 mg total) by mouth every 6 (six) hours as needed. (Patient taking differently: Take 5 mg by mouth every 6 (six) hours as needed for nausea or vomiting. ) 90 tablet 5  . VITAMIN B1-B12 IM Inject into the muscle every 30 (thirty) days.     No current facility-administered medications on file prior to visit.     Family Hx: The patient's family history includes Colon cancer in her daughter; Heart attack in her father and mother. There is no history of Stroke.  ROS:   Please see the history of present illness.     Review of Systems  Constitutional: Negative.   Respiratory: Positive for shortness of breath.   Cardiovascular: Negative.   Gastrointestinal: Negative.   Musculoskeletal: Negative.   Neurological: Negative.   Psychiatric/Behavioral: Negative.   All other systems reviewed and are negative.    Labs/Other Tests and Data Reviewed:    Recent Labs: 12/19/2018: BUN 26; Creatinine, Ser 1.39; Hemoglobin 12.3; Platelets 271; Potassium 4.4; Sodium 137   Recent Lipid Panel Lab Results  Component Value Date/Time  CHOL 294 (H) 09/30/2009 09:19 PM   TRIG 459 (H) 09/30/2009 09:19 PM   HDL 44 09/30/2009 09:19 PM   CHOLHDL 6.7 Ratio 09/30/2009 09:19 PM   LDLCALC See Comment mg/dL 09/30/2009 09:19 PM   LDLDIRECT 146 (H) 10/01/2009 08:51 AM    Wt Readings from Last 3 Encounters:  09/25/19 196 lb 8 oz (89.1 kg)  03/15/19 205 lb 12 oz (93.3 kg)  12/22/18 212 lb (96.2 kg)     Exam:     BP 120/80 (BP Location: Left Arm, Patient Position: Sitting, Cuff Size: Normal)   Pulse 86   Ht 5' (1.524 m)   Wt 196 lb 8 oz (89.1 kg)   SpO2 98%   BMI 38.38 kg/m    Constitutional:  oriented to person, place, and time. No distress.  HENT:  Head: Grossly normal Eyes:  no discharge. No scleral icterus.  Neck: No JVD, no carotid bruits  Cardiovascular: Regular rate and rhythm, no murmurs appreciated Pulmonary/Chest: Clear to auscultation bilaterally, no wheezes or rails Abdominal: Soft.  no distension.  no tenderness.  Musculoskeletal: Normal range of motion Neurological:  normal muscle tone. Coordination normal. No atrophy Skin: Skin warm and dry Psychiatric: normal affect, pleasant   ASSESSMENT & PLAN:    Atrial fibrillation, unspecified type ([place]) - Plan: EKG 12-Lead On anticoagulation, Prior AV node ablation Likely contributing to shortness of breath  Shortness of breath Chronic issue Echocardiogram and stress test without significant changes with normal EF Likely from deconditioning,  obesity, AV node ablation, paced rhythm, mild AAS, restrictive lung disease  Pacemaker-Medtronic CRT P. OptiVol frequently monitored, diuretics adjusted based on measurements  Chronic diastolic CHF (congestive heart failure) ([place]) Appears relatively euvolemic  Complete heart block-S./P. AV junction ablation pacemaker , ventricularly paced Recent pacer battery change  Hyperlipidemia Previously declined statin Recent dramatic weight loss over the past year Diet changed due to dental issues We have previously recommended Zetia on several occasions, again not on her list today   Total encounter time more than 25 minutes  Greater than 50% was spent in counseling and coordination of care with the patient  Follow-up 1 year We will alternate with Dr. Caryl Comes  Signed, Ida Rogue, MD  09/25/2019 4:09 PM     [place] Office [address], [place], [place] [address]

## 2019-09-25 ENCOUNTER — Ambulatory Visit (INDEPENDENT_AMBULATORY_CARE_PROVIDER_SITE_OTHER): Payer: Medicare Other | Admitting: Cardiovascular Disease

## 2019-09-25 ENCOUNTER — Other Ambulatory Visit: Payer: Self-pay

## 2019-09-25 ENCOUNTER — Encounter: Payer: Self-pay | Admitting: Cardiovascular Disease

## 2019-09-25 VITALS — BP 120/80 | HR 86 | Ht 60.0 in | Wt 196.5 lb

## 2019-09-25 DIAGNOSIS — I5032 Chronic diastolic (congestive) heart failure: Secondary | ICD-10-CM | POA: Diagnosis not present

## 2019-09-25 DIAGNOSIS — I442 Atrioventricular block, complete: Secondary | ICD-10-CM | POA: Diagnosis not present

## 2019-09-25 DIAGNOSIS — I4821 Permanent atrial fibrillation: Secondary | ICD-10-CM

## 2019-09-25 DIAGNOSIS — I10 Essential (primary) hypertension: Secondary | ICD-10-CM

## 2019-09-25 DIAGNOSIS — R0602 Shortness of breath: Secondary | ICD-10-CM

## 2019-09-25 DIAGNOSIS — Z950 Presence of cardiac pacemaker: Secondary | ICD-10-CM

## 2019-09-25 NOTE — Patient Instructions (Signed)
Medication Instructions:  No changes  If you need a refill on your cardiac medications before your next appointment, please call your pharmacy.    Lab work: No new labs needed   If you have labs (blood work) drawn today and your tests are completely normal, you will receive your results only by: . MyChart Message (if you have MyChart) OR . A paper copy in the mail If you have any lab test that is abnormal or we need to change your treatment, we will call you to review the results.   Testing/Procedures: No new testing needed   Follow-Up: At [facility], you and your health needs are our priority.  As part of our continuing mission to provide you with exceptional heart care, we have created designated Provider Care Teams.  These Care Teams include your primary Cardiologist (physician) and Advanced Practice Providers (APPs -  Physician Assistants and Nurse Practitioners) who all work together to provide you with the care you need, when you need it.  . You will need a follow up appointment in 12 months  . Providers on your designated Care Team:   . Christopher Berge, NP . Ryan Dunn, PA-C . Jacquelyn Visser, PA-C  Any Other Special Instructions Will Be Listed Below (If Applicable).  COVID-19 Vaccine Information can be found at: [email or web address] For questions related to vaccine distribution or appointments, please email [email or web address] or call [phone].     

## 2019-09-26 LAB — CUP PACEART REMOTE DEVICE CHECK
Battery Remaining Longevity: 128 mo
Battery Voltage: 3.06 V
Brady Statistic AP VP Percent: 0 %
Brady Statistic AP VS Percent: 0 %
Brady Statistic AS VP Percent: 98.22 %
Brady Statistic AS VS Percent: 1.78 %
Brady Statistic RA Percent Paced: 0 %
Brady Statistic RV Percent Paced: 98.22 %
Date Time Interrogation Session: 20210419214838
Implantable Lead Implant Date: 20120106
Implantable Lead Implant Date: 20120106
Implantable Lead Location: 753858
Implantable Lead Location: 753860
Implantable Lead Model: 5076
Implantable Pulse Generator Implant Date: 20200717
Lead Channel Impedance Value: 3306 Ohm
Lead Channel Impedance Value: 3306 Ohm
Lead Channel Impedance Value: 380 Ohm
Lead Channel Impedance Value: 456 Ohm
Lead Channel Impedance Value: 475 Ohm
Lead Channel Impedance Value: 494 Ohm
Lead Channel Impedance Value: 589 Ohm
Lead Channel Impedance Value: 627 Ohm
Lead Channel Impedance Value: 817 Ohm
Lead Channel Pacing Threshold Amplitude: 0.75 V
Lead Channel Pacing Threshold Pulse Width: 0.4 ms
Lead Channel Setting Pacing Amplitude: 2.5 V
Lead Channel Setting Pacing Pulse Width: 0.4 ms
Lead Channel Setting Sensing Sensitivity: 2.8 mV

## 2019-09-27 ENCOUNTER — Ambulatory Visit (INDEPENDENT_AMBULATORY_CARE_PROVIDER_SITE_OTHER): Payer: Medicare Other | Admitting: *Deleted

## 2019-09-27 DIAGNOSIS — I5032 Chronic diastolic (congestive) heart failure: Secondary | ICD-10-CM

## 2019-09-28 NOTE — Progress Notes (Signed)
PPM Remote  

## 2019-10-04 ENCOUNTER — Telehealth: Payer: Self-pay | Admitting: Cardiovascular Disease

## 2019-10-04 NOTE — Telephone Encounter (Signed)
I spoke with the pt daughter and she states the pt do have her Medtronic card.

## 2019-10-04 NOTE — Telephone Encounter (Signed)
Patient having eye surgery and needs new device card for surgeon.    Please advise .

## 2019-10-08 ENCOUNTER — Ambulatory Visit (INDEPENDENT_AMBULATORY_CARE_PROVIDER_SITE_OTHER): Payer: Medicare Other

## 2019-10-08 DIAGNOSIS — Z950 Presence of cardiac pacemaker: Secondary | ICD-10-CM | POA: Diagnosis not present

## 2019-10-08 DIAGNOSIS — I5032 Chronic diastolic (congestive) heart failure: Secondary | ICD-10-CM

## 2019-10-09 ENCOUNTER — Telehealth: Payer: Self-pay

## 2019-10-09 NOTE — Progress Notes (Signed)
EPIC Encounter for ICM Monitoring  Patient Name: Jacqueline Lozano is a 85 y.o. female Date: 10/09/2019 Primary Care Physican: [name], MD Primary Cardiologist:[name] Electrophysiologist: Vergie Living Pacing: 96.7% LastWeight: unknown  Spoke with patient and she is doing well.    Optivolthoracic impedancenormal.  Prescribed:Furosemide 20 mgTake 1 tablet (20 mg total) by mouth as directed.  Take once daily with extra dose after lunch if you have leg swelling.Klor Con 10 mEq 1 tablet daily  Recommendations: No changes and encouraged to call if experiencing any fluid symptoms.  Follow-up plan: ICM clinic phone appointment on6/12/2019. 91 day device clinic remote transmission 12/27/2019.   Copy of ICM check sent to [place].  3 month ICM trend: 10/09/2019    1 Year ICM trend:       Rosalene Billings, RN 10/09/2019 4:09 PM

## 2019-10-09 NOTE — Telephone Encounter (Signed)
Spoke with patient to remind of missed remote transmission 

## 2019-10-22 ENCOUNTER — Encounter: Payer: Self-pay | Admitting: Ophthalmology

## 2019-10-22 ENCOUNTER — Other Ambulatory Visit: Payer: Self-pay

## 2019-10-25 ENCOUNTER — Other Ambulatory Visit: Payer: Self-pay

## 2019-10-25 ENCOUNTER — Other Ambulatory Visit
Admission: RE | Admit: 2019-10-25 | Discharge: 2019-10-25 | Disposition: A | Discharge status: Home / Self Care | Payer: Medicare Other | Source: Ambulatory Visit | Attending: Ophthalmology | Admitting: Ophthalmology

## 2019-10-25 DIAGNOSIS — Z01812 Encounter for preprocedural laboratory examination: Secondary | ICD-10-CM | POA: Diagnosis present

## 2019-10-25 DIAGNOSIS — Z20822 Contact with and (suspected) exposure to covid-19: Secondary | ICD-10-CM | POA: Diagnosis not present

## 2019-10-25 NOTE — Discharge Instructions (Signed)

## 2019-10-26 LAB — SARS CORONAVIRUS 2 (TAT 6-24 HRS): SARS Coronavirus 2: NEGATIVE

## 2019-10-29 ENCOUNTER — Encounter
Admission: RE | Disposition: A | Discharge status: Home / Self Care | Payer: Self-pay | Source: Home / Self Care | Attending: Ophthalmology

## 2019-10-29 ENCOUNTER — Other Ambulatory Visit: Payer: Self-pay

## 2019-10-29 ENCOUNTER — Ambulatory Visit
Admission: RE | Admit: 2019-10-29 | Discharge: 2019-10-29 | Disposition: A | Discharge status: Home / Self Care | Payer: Medicare Other | Attending: Ophthalmology | Admitting: Ophthalmology

## 2019-10-29 ENCOUNTER — Encounter: Payer: Self-pay | Admitting: Ophthalmology

## 2019-10-29 ENCOUNTER — Ambulatory Visit: Payer: Medicare Other | Admitting: Anesthesiology

## 2019-10-29 DIAGNOSIS — Z79899 Other long term (current) drug therapy: Secondary | ICD-10-CM | POA: Insufficient documentation

## 2019-10-29 DIAGNOSIS — K219 Gastro-esophageal reflux disease without esophagitis: Secondary | ICD-10-CM | POA: Diagnosis not present

## 2019-10-29 DIAGNOSIS — Z79890 Hormone replacement therapy (postmenopausal): Secondary | ICD-10-CM | POA: Diagnosis not present

## 2019-10-29 DIAGNOSIS — E039 Hypothyroidism, unspecified: Secondary | ICD-10-CM | POA: Insufficient documentation

## 2019-10-29 DIAGNOSIS — I509 Heart failure, unspecified: Secondary | ICD-10-CM | POA: Insufficient documentation

## 2019-10-29 DIAGNOSIS — I442 Atrioventricular block, complete: Secondary | ICD-10-CM | POA: Insufficient documentation

## 2019-10-29 DIAGNOSIS — I110 Hypertensive heart disease with heart failure: Secondary | ICD-10-CM | POA: Diagnosis not present

## 2019-10-29 DIAGNOSIS — I739 Peripheral vascular disease, unspecified: Secondary | ICD-10-CM | POA: Diagnosis not present

## 2019-10-29 DIAGNOSIS — G4730 Sleep apnea, unspecified: Secondary | ICD-10-CM | POA: Diagnosis not present

## 2019-10-29 DIAGNOSIS — Z7901 Long term (current) use of anticoagulants: Secondary | ICD-10-CM | POA: Insufficient documentation

## 2019-10-29 DIAGNOSIS — Z950 Presence of cardiac pacemaker: Secondary | ICD-10-CM | POA: Diagnosis not present

## 2019-10-29 DIAGNOSIS — H2511 Age-related nuclear cataract, right eye: Secondary | ICD-10-CM | POA: Insufficient documentation

## 2019-10-29 HISTORY — DX: Dependence on wheelchair: Z99.3

## 2019-10-29 HISTORY — DX: Complete loss of teeth, unspecified cause, unspecified class: K08.109

## 2019-10-29 HISTORY — PX: CATARACT EXTRACTION W/PHACO: SHX586

## 2019-10-29 SURGERY — PHACOEMULSIFICATION, CATARACT, WITH IOL INSERTION
Anesthesia: Monitor Anesthesia Care | Site: Eye | Laterality: Right

## 2019-10-29 MED ORDER — EPINEPHRINE PF 1 MG/ML IJ SOLN
INTRAOCULAR | Status: DC | PRN
  Administered 2019-10-29: 78 mL via OPHTHALMIC

## 2019-10-29 MED ORDER — TETRACAINE HCL 0.5 % OP SOLN
1.0000 [drp] | OPHTHALMIC | Status: DC | PRN
  Administered 2019-10-29 (×3): 1 [drp] via OPHTHALMIC

## 2019-10-29 MED ORDER — FENTANYL CITRATE (PF) 100 MCG/2ML IJ SOLN
INTRAMUSCULAR | Status: DC | PRN
  Administered 2019-10-29: 50 ug via INTRAVENOUS

## 2019-10-29 MED ORDER — ACETAMINOPHEN 160 MG/5ML PO SOLN: 325.0000 mg | ORAL | Status: DC | PRN

## 2019-10-29 MED ORDER — LIDOCAINE HCL (PF) 2 % IJ SOLN
INTRAOCULAR | Status: DC | PRN
  Administered 2019-10-29: 2 mL via INTRAOCULAR

## 2019-10-29 MED ORDER — ONDANSETRON HCL 4 MG/2ML IJ SOLN: 4.0000 mg | Freq: Once | INTRAMUSCULAR | Status: DC | PRN

## 2019-10-29 MED ORDER — MIDAZOLAM HCL 2 MG/2ML IJ SOLN
INTRAMUSCULAR | Status: DC | PRN
  Administered 2019-10-29: 1 mg via INTRAVENOUS

## 2019-10-29 MED ORDER — ACETAMINOPHEN 325 MG PO TABS: 325.0000 mg | ORAL_TABLET | ORAL | Status: DC | PRN

## 2019-10-29 MED ORDER — SODIUM HYALURONATE 10 MG/ML IO SOLN
INTRAOCULAR | Status: DC | PRN
  Administered 2019-10-29: 0.55 mL via INTRAOCULAR

## 2019-10-29 MED ORDER — SODIUM HYALURONATE 23 MG/ML IO SOLN
INTRAOCULAR | Status: DC | PRN
  Administered 2019-10-29: 0.6 mL via INTRAOCULAR

## 2019-10-29 MED ORDER — MOXIFLOXACIN HCL 0.5 % OP SOLN
OPHTHALMIC | Status: DC | PRN
  Administered 2019-10-29: 0.2 mL via OPHTHALMIC

## 2019-10-29 MED ORDER — ARMC OPHTHALMIC DILATING DROPS
1.0000 "application " | OPHTHALMIC | Status: DC | PRN
  Administered 2019-10-29 (×3): 1 via OPHTHALMIC

## 2019-10-29 SURGICAL SUPPLY — 20 items
CANNULA ANT/CHMB 27G (MISCELLANEOUS) ×2 IMPLANT
CANNULA ANT/CHMB 27GA (MISCELLANEOUS) ×6 IMPLANT
DISSECTOR HYDRO NUCLEUS 50X22 (MISCELLANEOUS) ×3 IMPLANT
GLOVE SURG LX 7.5 STRW (GLOVE) ×2
GLOVE SURG LX STRL 7.5 STRW (GLOVE) ×1 IMPLANT
GLOVE SURG SYN 8.5  E (GLOVE) ×6
GLOVE SURG SYN 8.5 E (GLOVE) ×2 IMPLANT
GLOVE SURG SYN 8.5 PF PI (GLOVE) ×1 IMPLANT
GOWN STRL REUS W/ TWL LRG LVL3 (GOWN DISPOSABLE) ×2 IMPLANT
GOWN STRL REUS W/TWL LRG LVL3 (GOWN DISPOSABLE) ×6
LENS IOL DIOP 20.5 (Intraocular Lens) ×3 IMPLANT
LENS IOL TECNIS MONO 20.5 (Intraocular Lens) IMPLANT
MARKER SKIN DUAL TIP RULER LAB (MISCELLANEOUS) ×3 IMPLANT
PACK DR. KING ARMS (PACKS) ×3 IMPLANT
PACK EYE AFTER SURG (MISCELLANEOUS) ×3 IMPLANT
PACK OPTHALMIC (MISCELLANEOUS) ×3 IMPLANT
SYR 3ML LL SCALE MARK (SYRINGE) ×3 IMPLANT
SYR TB 1ML LUER SLIP (SYRINGE) ×3 IMPLANT
WATER STERILE IRR 250ML POUR (IV SOLUTION) ×3 IMPLANT
WIPE NON LINTING 3.25X3.25 (MISCELLANEOUS) ×3 IMPLANT

## 2019-10-29 NOTE — Anesthesia Preprocedure Evaluation (Signed)
Anesthesia Evaluation  Patient identified by MRN, date of birth, ID band Patient awake    Reviewed: Allergy & Precautions, H&P , NPO status , Patient's Chart, lab work & pertinent test results, reviewed documented beta blocker date and time   Airway Mallampati: II  TM Distance: >3 FB Neck ROM: full    Dental no notable dental hx.    Pulmonary sleep apnea ,    Pulmonary exam normal breath sounds clear to auscultation       Cardiovascular Exercise Tolerance: Good hypertension, + Peripheral Vascular Disease and +CHF  + dysrhythmias (complete heart block) + pacemaker  Rhythm:regular Rate:Normal     Neuro/Psych  Headaches, negative psych ROS   GI/Hepatic Neg liver ROS, GERD  ,  Endo/Other  Hypothyroidism   Renal/GU negative Renal ROS  negative genitourinary   Musculoskeletal   Abdominal   Peds  Hematology negative hematology ROS (+)   Anesthesia Other Findings   Reproductive/Obstetrics negative OB ROS                             Anesthesia Physical Anesthesia Plan  ASA: III  Anesthesia Plan: MAC   Post-op Pain Management:    Induction:   PONV Risk Score and Plan: 2 and Treatment may vary due to age or medical condition  Airway Management Planned:   Additional Equipment:   Intra-op Plan:   Post-operative Plan:   Informed Consent: I have reviewed the patients History and Physical, chart, labs and discussed the procedure including the risks, benefits and alternatives for the proposed anesthesia with the patient or authorized representative who has indicated his/her understanding and acceptance.     Dental Advisory Given  Plan Discussed with: CRNA  Anesthesia Plan Comments:         Anesthesia Quick Evaluation

## 2019-10-29 NOTE — H&P (Signed)
The History and Physical notes are on paper, have been signed, and are to be scanned.   I have examined the patient and there are no changes to the H&P.   Attestation: 1. The patient's impairment of visual function is believed not to be correctable with a tolerable change in glasses or contact lenses. 2. Cataract (in the operative eye) is believed to be significantly contributing to the patient's visual impairment. 3. The patient desires surgical correction; the risks, benefits and alternatives have been explained; and a reasonable expectation exists that lens surgery will significantly improve both the visual and functional status of the patient. 4. The patient is allergic to betadine and acknowledges the increased risk of endophthalmitis with use of alternative prep agents.  I certify the statements are true to the best of my knowledge.  Benay Pillow 10/29/2019 9:51 AM

## 2019-10-29 NOTE — Op Note (Signed)
OPERATIVE NOTE  Jacqueline Lozano [id number] 10/29/2019   PREOPERATIVE DIAGNOSIS:  Nuclear sclerotic cataract right eye.  H25.11   POSTOPERATIVE DIAGNOSIS:    Nuclear sclerotic cataract right eye.     PROCEDURE:  Phacoemusification with posterior chamber intraocular lens placement of the right eye   LENS:   Implant Name Type Inv. Item Serial No. Manufacturer Lot No. LRB No. Used Action  LENS IOL DIOP 20.5 - [id number] Intraocular Lens LENS IOL DIOP 20.5 [id number]  Right 1 Implanted       Procedure(s) with comments: CATARACT EXTRACTION PHACO AND INTRAOCULAR LENS PLACEMENT (IOC) RIGHT 3.87 00:41.2 (Right) - sleep apnea  DCB00 +20.5   ULTRASOUND TIME: 0 minutes 41 seconds.  CDE 3.87   SURGEON:  Benay Pillow, MD, MPH  ANESTHESIOLOGIST: Anesthesiologist: Alisa Graff, MD CRNA: Mayme Genta, CRNA   ANESTHESIA:  Topical with tetracaine drops augmented with 1% preservative-free intracameral lidocaine.  ESTIMATED BLOOD LOSS: less than 1 mL.   COMPLICATIONS:  None.   DESCRIPTION OF PROCEDURE:  The patient was identified in the holding room and transported to the operating room and placed in the supine position under the operating microscope.  The right eye was identified as the operative eye and it was prepped and draped in the usual sterile ophthalmic fashion (using phisoderm soap instead of betadine, since the patient has an allergy to betadine).   A 1.0 millimeter clear-corneal paracentesis was made at the 10:30 position. 0.5 ml of preservative-free 1% lidocaine with epinephrine was injected into the anterior chamber.  The anterior chamber was filled with Healon 5 viscoelastic.  A 2.4 millimeter keratome was used to make a near-clear corneal incision at the 8:00 position.  A curvilinear capsulorrhexis was made with a cystotome and capsulorrhexis forceps.  Balanced salt solution was used to hydrodissect and hydrodelineate the nucleus.   Phacoemulsification was then used in  stop and chop fashion to remove the lens nucleus and epinucleus.  The remaining cortex was then removed using the irrigation and aspiration handpiece. Healon was then placed into the capsular bag to distend it for lens placement.  A lens was then injected into the capsular bag.  The remaining viscoelastic was aspirated.   Wounds were hydrated with balanced salt solution.  The anterior chamber was inflated to a physiologic pressure with balanced salt solution.   Intracameral vigamox 0.1 mL undiluted was injected into the eye and a drop placed onto the ocular surface.  No wound leaks were noted.  The patient was taken to the recovery room in stable condition without complications of anesthesia or surgery  Benay Pillow 10/29/2019, 10:23 AM

## 2019-10-29 NOTE — Anesthesia Procedure Notes (Signed)
Procedure Name: MAC Performed by: Maritza Hosterman, CRNA Pre-anesthesia Checklist: Patient identified, Emergency Drugs available, Suction available, Timeout performed and Patient being monitored Patient Re-evaluated:Patient Re-evaluated prior to induction Oxygen Delivery Method: Nasal cannula Placement Confirmation: positive ETCO2       

## 2019-10-29 NOTE — Transfer of Care (Signed)
Immediate Anesthesia Transfer of Care Note  Patient: Jacqueline Lozano  Procedure(s) Performed: CATARACT EXTRACTION PHACO AND INTRAOCULAR LENS PLACEMENT (IOC) RIGHT 3.87 00:41.2 (Right Eye)  Patient Location: PACU  Anesthesia Type: MAC  Level of Consciousness: awake, alert  and patient cooperative  Airway and Oxygen Therapy: Patient Spontanous Breathing and Patient connected to supplemental oxygen  Post-op Assessment: Post-op Vital signs reviewed, Patient's Cardiovascular Status Stable, Respiratory Function Stable, Patent Airway and No signs of Nausea or vomiting  Post-op Vital Signs: Reviewed and stable  Complications: No apparent anesthesia complications

## 2019-10-29 NOTE — Anesthesia Postprocedure Evaluation (Signed)
Anesthesia Post Note  Patient: Jacqueline Lozano  Procedure(s) Performed: CATARACT EXTRACTION PHACO AND INTRAOCULAR LENS PLACEMENT (IOC) RIGHT 3.87 00:41.2 (Right Eye)     Patient location during evaluation: PACU Anesthesia Type: MAC Level of consciousness: awake and alert Pain management: pain level controlled Vital Signs Assessment: post-procedure vital signs reviewed and stable Respiratory status: spontaneous breathing, nonlabored ventilation, respiratory function stable and patient connected to nasal cannula oxygen Cardiovascular status: stable and blood pressure returned to baseline Postop Assessment: no apparent nausea or vomiting Anesthetic complications: no    Alisa Graff

## 2019-10-30 ENCOUNTER — Encounter: Payer: Self-pay | Admitting: *Deleted

## 2019-11-12 ENCOUNTER — Telehealth: Payer: Self-pay

## 2019-11-12 ENCOUNTER — Ambulatory Visit (INDEPENDENT_AMBULATORY_CARE_PROVIDER_SITE_OTHER): Payer: Medicare Other

## 2019-11-12 DIAGNOSIS — Z950 Presence of cardiac pacemaker: Secondary | ICD-10-CM | POA: Diagnosis not present

## 2019-11-12 DIAGNOSIS — I5032 Chronic diastolic (congestive) heart failure: Secondary | ICD-10-CM | POA: Diagnosis not present

## 2019-11-12 NOTE — Progress Notes (Signed)
EPIC Encounter for ICM Monitoring  Patient Name: Jacqueline Lozano is a 85 y.o. female Date: 11/12/2019 Primary Care Physican: [name], MD Primary Cardiologist:[name] Electrophysiologist: Vergie Living Pacing: 97.9% LastWeight: unknown  Attempted call to patient and unable to reach.  Left detailed message per DPR regarding transmission. Transmission reviewed.   Optivolthoracic impedancesuggesting possible fluid accumulation since 10/22/2019.  Prescribed:Furosemide 20 mgTake 1 tablet (20 mg total) by mouth as directed. Take once daily with extra dose after lunch if you have leg swelling.Klor Con 10 mEq 1 tablet daily  Recommendations: Left voice mail with ICM number and encouraged to call if experiencing any fluid symptoms.  Follow-up plan: ICM clinic phone appointment on6/14/2021 to recheck fluid levels. 91 day device clinic remote transmission 12/27/2019.   Copy of ICM check sent to Dr.Klein and Dr Rockey Situ.  3 month ICM trend: 11/12/2019    1 Year ICM trend:       Rosalene Billings, RN 11/12/2019 1:30 PM

## 2019-11-12 NOTE — Telephone Encounter (Signed)
Remote ICM transmission received.  Attempted call to patient regarding ICM remote transmission and left detailed message per DPR to return call.   

## 2019-11-14 NOTE — Progress Notes (Signed)
Returned patient call.  Transmission reviewed.  She said her feet have been swollen but has been resolved.  She will be having 2nd cataract surgery in June but no date has been scheduled. Advised to limit salt and fluid intake.  Will recheck fluid levels again on 11/19/2019.

## 2019-11-19 ENCOUNTER — Ambulatory Visit (INDEPENDENT_AMBULATORY_CARE_PROVIDER_SITE_OTHER): Payer: Medicare Other

## 2019-11-19 DIAGNOSIS — Z950 Presence of cardiac pacemaker: Secondary | ICD-10-CM

## 2019-11-19 DIAGNOSIS — I5032 Chronic diastolic (congestive) heart failure: Secondary | ICD-10-CM

## 2019-11-21 NOTE — Progress Notes (Signed)
EPIC Encounter for ICM Monitoring  Patient Name: MAN BONNEAU is a 85 y.o. female Date: 11/21/2019 Primary Care Physican: [name], MD Primary Cardiologist:[name] Electrophysiologist: Vergie Living Pacing: 97.9% LastWeight: unknown  Spoke with patient and reports feeling well at this time.  Denies fluid symptoms.    Optivolthoracic impedance returned to normal.  Prescribed:Furosemide 20 mgTake 1 tablet (20 mg total) by mouth as directed. Take once daily with extra dose after lunch if you have leg swelling.Klor Con 10 mEq 1 tablet daily  Recommendations: No changes and encouraged to call if experiencing any fluid symptoms.  Follow-up plan: ICM clinic phone appointment on7/13/2021. 91 day device clinic remote transmission7/22/2021.   Copy of ICM check sent to [place].  3 month ICM trend: 11/19/2019    1 Year ICM trend:       Rosalene Billings, RN 11/21/2019 9:28 AM

## 2019-11-27 ENCOUNTER — Encounter: Payer: Self-pay | Admitting: Ophthalmology

## 2019-11-27 ENCOUNTER — Other Ambulatory Visit: Payer: Self-pay

## 2019-11-29 ENCOUNTER — Other Ambulatory Visit
Admission: RE | Admit: 2019-11-29 | Discharge: 2019-11-29 | Disposition: A | Discharge status: Home / Self Care | Payer: Medicare Other | Source: Ambulatory Visit | Attending: Ophthalmology | Admitting: Ophthalmology

## 2019-11-29 ENCOUNTER — Other Ambulatory Visit: Payer: Self-pay

## 2019-11-29 DIAGNOSIS — Z20822 Contact with and (suspected) exposure to covid-19: Secondary | ICD-10-CM | POA: Insufficient documentation

## 2019-11-29 DIAGNOSIS — Z01812 Encounter for preprocedural laboratory examination: Secondary | ICD-10-CM | POA: Insufficient documentation

## 2019-11-29 LAB — SARS CORONAVIRUS 2 (TAT 6-24 HRS): SARS Coronavirus 2: NEGATIVE

## 2019-11-29 NOTE — Discharge Instructions (Signed)

## 2019-12-03 ENCOUNTER — Ambulatory Visit: Payer: Medicare Other | Admitting: Anesthesiology

## 2019-12-03 ENCOUNTER — Encounter: Payer: Self-pay | Admitting: Ophthalmology

## 2019-12-03 ENCOUNTER — Encounter
Admission: RE | Disposition: A | Discharge status: Home / Self Care | Payer: Self-pay | Source: Home / Self Care | Attending: Ophthalmology

## 2019-12-03 ENCOUNTER — Ambulatory Visit
Admission: RE | Admit: 2019-12-03 | Discharge: 2019-12-03 | Disposition: A | Discharge status: Home / Self Care | Payer: Medicare Other | Attending: Ophthalmology | Admitting: Ophthalmology

## 2019-12-03 DIAGNOSIS — I4891 Unspecified atrial fibrillation: Secondary | ICD-10-CM | POA: Insufficient documentation

## 2019-12-03 DIAGNOSIS — H2512 Age-related nuclear cataract, left eye: Secondary | ICD-10-CM | POA: Insufficient documentation

## 2019-12-03 DIAGNOSIS — Z7901 Long term (current) use of anticoagulants: Secondary | ICD-10-CM | POA: Insufficient documentation

## 2019-12-03 DIAGNOSIS — I509 Heart failure, unspecified: Secondary | ICD-10-CM | POA: Insufficient documentation

## 2019-12-03 DIAGNOSIS — Z8673 Personal history of transient ischemic attack (TIA), and cerebral infarction without residual deficits: Secondary | ICD-10-CM | POA: Diagnosis not present

## 2019-12-03 DIAGNOSIS — E039 Hypothyroidism, unspecified: Secondary | ICD-10-CM | POA: Diagnosis not present

## 2019-12-03 DIAGNOSIS — Z882 Allergy status to sulfonamides status: Secondary | ICD-10-CM | POA: Diagnosis not present

## 2019-12-03 DIAGNOSIS — Z6836 Body mass index (BMI) 36.0-36.9, adult: Secondary | ICD-10-CM | POA: Insufficient documentation

## 2019-12-03 DIAGNOSIS — E669 Obesity, unspecified: Secondary | ICD-10-CM | POA: Insufficient documentation

## 2019-12-03 DIAGNOSIS — I442 Atrioventricular block, complete: Secondary | ICD-10-CM | POA: Diagnosis not present

## 2019-12-03 DIAGNOSIS — Z950 Presence of cardiac pacemaker: Secondary | ICD-10-CM | POA: Diagnosis not present

## 2019-12-03 DIAGNOSIS — I110 Hypertensive heart disease with heart failure: Secondary | ICD-10-CM | POA: Diagnosis not present

## 2019-12-03 DIAGNOSIS — G629 Polyneuropathy, unspecified: Secondary | ICD-10-CM | POA: Insufficient documentation

## 2019-12-03 DIAGNOSIS — Z885 Allergy status to narcotic agent status: Secondary | ICD-10-CM | POA: Insufficient documentation

## 2019-12-03 DIAGNOSIS — Z888 Allergy status to other drugs, medicaments and biological substances status: Secondary | ICD-10-CM | POA: Diagnosis not present

## 2019-12-03 DIAGNOSIS — I209 Angina pectoris, unspecified: Secondary | ICD-10-CM | POA: Diagnosis not present

## 2019-12-03 DIAGNOSIS — Z79890 Hormone replacement therapy (postmenopausal): Secondary | ICD-10-CM | POA: Insufficient documentation

## 2019-12-03 HISTORY — PX: CATARACT EXTRACTION W/PHACO: SHX586

## 2019-12-03 SURGERY — PHACOEMULSIFICATION, CATARACT, WITH IOL INSERTION
Anesthesia: Monitor Anesthesia Care | Site: Eye | Laterality: Left

## 2019-12-03 MED ORDER — LACTATED RINGERS IV SOLN: INTRAVENOUS | Status: DC

## 2019-12-03 MED ORDER — SODIUM HYALURONATE 23 MG/ML IO SOLN
INTRAOCULAR | Status: DC | PRN
  Administered 2019-12-03: 0.6 mL via INTRAOCULAR

## 2019-12-03 MED ORDER — TETRACAINE HCL 0.5 % OP SOLN
1.0000 [drp] | OPHTHALMIC | Status: DC | PRN
  Administered 2019-12-03 (×3): 1 [drp] via OPHTHALMIC

## 2019-12-03 MED ORDER — MIDAZOLAM HCL 2 MG/2ML IJ SOLN
INTRAMUSCULAR | Status: DC | PRN
  Administered 2019-12-03: 1 mg via INTRAVENOUS

## 2019-12-03 MED ORDER — FENTANYL CITRATE (PF) 100 MCG/2ML IJ SOLN
INTRAMUSCULAR | Status: DC | PRN
  Administered 2019-12-03: 50 ug via INTRAVENOUS

## 2019-12-03 MED ORDER — ARMC OPHTHALMIC DILATING DROPS
1.0000 "application " | OPHTHALMIC | Status: DC | PRN
  Administered 2019-12-03 (×3): 1 via OPHTHALMIC

## 2019-12-03 MED ORDER — MOXIFLOXACIN HCL 0.5 % OP SOLN
OPHTHALMIC | Status: DC | PRN
  Administered 2019-12-03: 0.2 mL via OPHTHALMIC

## 2019-12-03 MED ORDER — LIDOCAINE HCL (PF) 2 % IJ SOLN
INTRAOCULAR | Status: DC | PRN
  Administered 2019-12-03: 1 mL via INTRAOCULAR

## 2019-12-03 MED ORDER — SODIUM HYALURONATE 10 MG/ML IO SOLN
INTRAOCULAR | Status: DC | PRN
  Administered 2019-12-03: 0.55 mL via INTRAOCULAR

## 2019-12-03 MED ORDER — EPINEPHRINE PF 1 MG/ML IJ SOLN
INTRAOCULAR | Status: DC | PRN
  Administered 2019-12-03: 98 mL via OPHTHALMIC

## 2019-12-03 SURGICAL SUPPLY — 20 items
CANNULA ANT/CHMB 27G (MISCELLANEOUS) ×2 IMPLANT
CANNULA ANT/CHMB 27GA (MISCELLANEOUS) ×6 IMPLANT
DISSECTOR HYDRO NUCLEUS 50X22 (MISCELLANEOUS) ×3 IMPLANT
GLOVE SURG LX 7.5 STRW (GLOVE) ×2
GLOVE SURG LX STRL 7.5 STRW (GLOVE) ×1 IMPLANT
GLOVE SURG SYN 8.5  E (GLOVE) ×3
GLOVE SURG SYN 8.5 E (GLOVE) ×1 IMPLANT
GLOVE SURG SYN 8.5 PF PI (GLOVE) ×1 IMPLANT
GOWN STRL REUS W/ TWL LRG LVL3 (GOWN DISPOSABLE) ×2 IMPLANT
GOWN STRL REUS W/TWL LRG LVL3 (GOWN DISPOSABLE) ×6
LENS IOL DIOP 20.0 (Intraocular Lens) ×3 IMPLANT
LENS IOL TECNIS MONO 20.0 (Intraocular Lens) IMPLANT
MARKER SKIN DUAL TIP RULER LAB (MISCELLANEOUS) ×3 IMPLANT
PACK DR. KING ARMS (PACKS) ×3 IMPLANT
PACK EYE AFTER SURG (MISCELLANEOUS) ×3 IMPLANT
PACK OPTHALMIC (MISCELLANEOUS) ×3 IMPLANT
SYR 3ML LL SCALE MARK (SYRINGE) ×3 IMPLANT
SYR TB 1ML LUER SLIP (SYRINGE) ×3 IMPLANT
WATER STERILE IRR 250ML POUR (IV SOLUTION) ×3 IMPLANT
WIPE NON LINTING 3.25X3.25 (MISCELLANEOUS) ×3 IMPLANT

## 2019-12-03 NOTE — H&P (Signed)

## 2019-12-03 NOTE — Transfer of Care (Signed)
Immediate Anesthesia Transfer of Care Note  Patient: Jacqueline Lozano  Procedure(s) Performed: CATARACT EXTRACTION PHACO AND INTRAOCULAR LENS PLACEMENT (IOC) LEFT 6.02  00:51.3 (Left Eye)  Patient Location: PACU  Anesthesia Type: MAC  Level of Consciousness: awake, alert  and patient cooperative  Airway and Oxygen Therapy: Patient Spontanous Breathing and Patient connected to supplemental oxygen  Post-op Assessment: Post-op Vital signs reviewed, Patient's Cardiovascular Status Stable, Respiratory Function Stable, Patent Airway and No signs of Nausea or vomiting  Post-op Vital Signs: Reviewed and stable  Complications: No complications documented.

## 2019-12-03 NOTE — Anesthesia Procedure Notes (Signed)
Procedure Name: MAC Date/Time: 12/03/2019 11:50 AM Performed by: Silvana Newness, CRNA Pre-anesthesia Checklist: Patient identified, Emergency Drugs available, Suction available, Patient being monitored and Timeout performed Patient Re-evaluated:Patient Re-evaluated prior to induction Oxygen Delivery Method: Nasal cannula

## 2019-12-03 NOTE — Anesthesia Preprocedure Evaluation (Signed)
Anesthesia Evaluation  Patient identified by MRN, date of birth, ID band Patient awake    History of Anesthesia Complications Negative for: history of anesthetic complications  Airway Mallampati: II  TM Distance: >3 FB Neck ROM: Full    Dental  (+) Edentulous Upper, Missing,    Pulmonary sleep apnea ,    Pulmonary exam normal        Cardiovascular hypertension, Normal cardiovascular exam+ dysrhythmias (afib s/p ablation. complete heart block s/p PPM placement) Atrial Fibrillation   NM study 2018:   Blood pressure demonstrated a normal response to exercise.  There was no ST segment deviation noted during stress.  No T wave inversion was noted during stress.  The study is normal.  This is a low risk study.  The left ventricular ejection fraction is hyperdynamic (>65%).  Nuclear stress EF: 76%.   Conclusion Adequate Lexiscan Myoview EKG nondiagnostic  2019 echo Study Conclusions   - Left ventricle: The cavity size was normal. There was mild  concentric hypertrophy. Systolic function was normal. The  estimated ejection fraction was in the range of 60% to 65%. Wall  motion was normal; there were no regional wall motion  abnormalities. The study was not technically sufficient to allow  evaluation of LV diastolic dysfunction due to atrial  fibrillation.  - Aortic valve: There was mild stenosis. Mean gradient (S): 6 mm  Hg. Valve area (VTI): 1.7 cm^2.  - Left atrium: The atrium was moderately dilated.  - Right atrium: The atrium was mildly dilated.  - Pulmonary arteries: Systolic pressure could not be accurately  estimated.    Neuro/Psych negative neurological ROS  negative psych ROS   GI/Hepatic   Endo/Other  Hypothyroidism Obese, BMI 36  Renal/GU      Musculoskeletal   Abdominal   Peds  Hematology   Anesthesia Other Findings   Reproductive/Obstetrics                           Anesthesia Physical Anesthesia Plan  ASA: III  Anesthesia Plan: MAC   Post-op Pain Management:    Induction:   PONV Risk Score and Plan: 2 and Midazolam, TIVA and Treatment may vary due to age or medical condition  Airway Management Planned: Nasal Cannula and Natural Airway  Additional Equipment: None  Intra-op Plan:   Post-operative Plan:   Informed Consent: I have reviewed the patients History and Physical, chart, labs and discussed the procedure including the risks, benefits and alternatives for the proposed anesthesia with the patient or authorized representative who has indicated his/her understanding and acceptance.       Plan Discussed with: CRNA  Anesthesia Plan Comments:         Anesthesia Quick Evaluation

## 2019-12-03 NOTE — Anesthesia Postprocedure Evaluation (Signed)
Anesthesia Post Note  Patient: Jacqueline Lozano  Procedure(s) Performed: CATARACT EXTRACTION PHACO AND INTRAOCULAR LENS PLACEMENT (IOC) LEFT 6.02  00:51.3 (Left Eye)     Patient location during evaluation: PACU Anesthesia Type: MAC Level of consciousness: awake and alert Pain management: pain level controlled Vital Signs Assessment: post-procedure vital signs reviewed and stable Respiratory status: spontaneous breathing Cardiovascular status: blood pressure returned to baseline Postop Assessment: no apparent nausea or vomiting, adequate PO intake and no headache Anesthetic complications: no   No complications documented.  Adele Barthel Laneta Guerin

## 2019-12-03 NOTE — Op Note (Signed)
OPERATIVE NOTE  Jacqueline Lozano [id number] 12/03/2019   PREOPERATIVE DIAGNOSIS:  Nuclear sclerotic cataract left eye.  H25.12   POSTOPERATIVE DIAGNOSIS:    Nuclear sclerotic cataract left eye.     PROCEDURE:  Phacoemusification with posterior chamber intraocular lens placement of the left eye   LENS:   Implant Name Type Inv. Item Serial No. Manufacturer Lot No. LRB No. Used Action  LENS IOL DIOP 20.0 - [id number] Intraocular Lens LENS IOL DIOP 20.0 [id number] [employer]O [employer]  Left 1 Implanted      Procedure(s) with comments: CATARACT EXTRACTION PHACO AND INTRAOCULAR LENS PLACEMENT (IOC) LEFT 6.02  00:51.3 (Left) - sleep apnea  DCB00 +20.0   ULTRASOUND TIME: 0 minutes 51 seconds.  CDE 6.02   SURGEON:  Benay Pillow, MD, MPH   ANESTHESIA:  Topical with tetracaine drops augmented with 1% preservative-free intracameral lidocaine.  ESTIMATED BLOOD LOSS: <1 mL   COMPLICATIONS:  None.   DESCRIPTION OF PROCEDURE:  The patient was identified in the holding room and transported to the operating room and placed in the supine position under the operating microscope.  The left eye was identified as the operative eye and it was prepped and draped in the usual sterile ophthalmic fashion.   A 1.0 millimeter clear-corneal paracentesis was made at the 5:00 position. 0.5 ml of preservative-free 1% lidocaine with epinephrine was injected into the anterior chamber.  The anterior chamber was filled with Healon 5 viscoelastic.  A 2.4 millimeter keratome was used to make a near-clear corneal incision at the 2:00 position.  A curvilinear capsulorrhexis was made with a cystotome and capsulorrhexis forceps.  Balanced salt solution was used to hydrodissect and hydrodelineate the nucleus.   Phacoemulsification was then used in stop and chop fashion to remove the lens nucleus and epinucleus.  The remaining cortex was then removed using the irrigation and aspiration handpiece. Healon was then placed  into the capsular bag to distend it for lens placement.    Upon the first attempt to inject the lens, the patient moved and the lens was not successfully injected.  The lens was loaded into a cartridge and again the lens was not successfully injected.   This lens was discarded and a new DCB00 lens was used to inject into the eye.  A lens was then injected into the capsular bag.  The remaining viscoelastic was aspirated.   Wounds were hydrated with balanced salt solution.  The anterior chamber was inflated to a physiologic pressure with balanced salt solution.  Intracameral vigamox 0.1 mL undiltued was injected into the eye and a drop placed onto the ocular surface.  No wound leaks were noted.  The patient was taken to the recovery room in stable condition without complications of anesthesia or surgery  Benay Pillow 12/03/2019, 12:12 PM

## 2019-12-04 ENCOUNTER — Encounter: Payer: Self-pay | Admitting: Ophthalmology

## 2019-12-18 ENCOUNTER — Ambulatory Visit (INDEPENDENT_AMBULATORY_CARE_PROVIDER_SITE_OTHER): Payer: Medicare Other

## 2019-12-18 DIAGNOSIS — Z950 Presence of cardiac pacemaker: Secondary | ICD-10-CM | POA: Diagnosis not present

## 2019-12-18 DIAGNOSIS — I5032 Chronic diastolic (congestive) heart failure: Secondary | ICD-10-CM | POA: Diagnosis not present

## 2019-12-21 NOTE — Progress Notes (Signed)
EPIC Encounter for ICM Monitoring  Patient Name: Jacqueline Lozano is a 85 y.o. female Date: 12/21/2019 Primary Care Physican: [name], MD Primary Cardiologist:[name] Electrophysiologist: Vergie Living Pacing: 99.8% LastWeight: unknown  Spoke with patient and reports feeling well at this time.  Denies fluid symptoms.    Optivolthoracic impedance normal.  Prescribed:Furosemide 20 mgTake 1 tablet (20 mg total) by mouth as directed. Take once daily with extra dose after lunch if you have leg swelling.Klor Con 10 mEq 1 tablet daily  Recommendations:No changes and encouraged to call if experiencing any fluid symptoms.  Follow-up plan: ICM clinic phone appointment on8/23/2021. 91 day device clinic remote transmission10/21/2021.   EP/Cardiology Office Visits: 01/15/2020 with Dr. Caryl Comes.    Copy of ICM check sent to Dr. Caryl Comes.   3 month ICM trend: 12/18/2019    1 Year ICM trend:       Rosalene Billings, RN 12/21/2019 11:22 AM

## 2019-12-27 ENCOUNTER — Ambulatory Visit (INDEPENDENT_AMBULATORY_CARE_PROVIDER_SITE_OTHER): Payer: Medicare Other | Admitting: *Deleted

## 2019-12-27 DIAGNOSIS — I442 Atrioventricular block, complete: Secondary | ICD-10-CM

## 2019-12-27 LAB — CUP PACEART REMOTE DEVICE CHECK
Battery Remaining Longevity: 124 mo
Battery Voltage: 3.03 V
Brady Statistic AP VP Percent: 0 %
Brady Statistic AP VS Percent: 0 %
Brady Statistic AS VP Percent: 99.8 %
Brady Statistic AS VS Percent: 0.2 %
Brady Statistic RA Percent Paced: 0 %
Brady Statistic RV Percent Paced: 99.8 %
Date Time Interrogation Session: 20210721202259
Implantable Lead Implant Date: 20120106
Implantable Lead Implant Date: 20120106
Implantable Lead Location: 753858
Implantable Lead Location: 753860
Implantable Lead Model: 5076
Implantable Pulse Generator Implant Date: 20200717
Lead Channel Impedance Value: 3306 Ohm
Lead Channel Impedance Value: 3306 Ohm
Lead Channel Impedance Value: 380 Ohm
Lead Channel Impedance Value: 437 Ohm
Lead Channel Impedance Value: 475 Ohm
Lead Channel Impedance Value: 551 Ohm
Lead Channel Impedance Value: 570 Ohm
Lead Channel Impedance Value: 627 Ohm
Lead Channel Impedance Value: 855 Ohm
Lead Channel Pacing Threshold Amplitude: 0.625 V
Lead Channel Pacing Threshold Pulse Width: 0.4 ms
Lead Channel Setting Pacing Amplitude: 2.5 V
Lead Channel Setting Pacing Pulse Width: 0.4 ms
Lead Channel Setting Sensing Sensitivity: 2.8 mV

## 2019-12-31 NOTE — Progress Notes (Signed)
Remote pacemaker transmission.   

## 2020-01-15 ENCOUNTER — Other Ambulatory Visit: Payer: Self-pay

## 2020-01-15 ENCOUNTER — Ambulatory Visit (INDEPENDENT_AMBULATORY_CARE_PROVIDER_SITE_OTHER): Payer: Medicare Other | Admitting: Internal Medicine

## 2020-01-15 ENCOUNTER — Encounter: Payer: Self-pay | Admitting: Internal Medicine

## 2020-01-15 VITALS — BP 130/62 | HR 76 | Ht 63.0 in | Wt 189.2 lb

## 2020-01-15 DIAGNOSIS — I5032 Chronic diastolic (congestive) heart failure: Secondary | ICD-10-CM

## 2020-01-15 DIAGNOSIS — Z79899 Other long term (current) drug therapy: Secondary | ICD-10-CM

## 2020-01-15 DIAGNOSIS — I4821 Permanent atrial fibrillation: Secondary | ICD-10-CM | POA: Diagnosis not present

## 2020-01-15 DIAGNOSIS — Z950 Presence of cardiac pacemaker: Secondary | ICD-10-CM

## 2020-01-15 DIAGNOSIS — I442 Atrioventricular block, complete: Secondary | ICD-10-CM | POA: Diagnosis not present

## 2020-01-15 NOTE — Patient Instructions (Addendum)
Medication Instructions:  - Your physician recommends that you continue on your current medications as directed. Please refer to the Current Medication list given to you today.  *If you need a refill on your cardiac medications before your next appointment, please call your pharmacy*   Lab Work: - Your physician recommends that you have lab work today : BMP/ CBC  If you have labs (blood work) drawn today and your tests are completely normal, you will receive your results only by: Marland Kitchen MyChart Message (if you have MyChart) OR . A paper copy in the mail If you have any lab test that is abnormal or we need to change your treatment, we will call you to review the results.   Testing/Procedures: - none ordered   Follow-Up: At [facility], you and your health needs are our priority.  As part of our continuing mission to provide you with exceptional heart care, we have created designated Provider Care Teams.  These Care Teams include your primary Cardiologist (physician) and Advanced Practice Providers (APPs -  Physician Assistants and Nurse Practitioners) who all work together to provide you with the care you need, when you need it.  We recommend signing up for the patient portal called "MyChart".  Sign up information is provided on this After Visit Summary.  MyChart is used to connect with patients for Virtual Visits (Telemedicine).  Patients are able to view lab/test results, encounter notes, upcoming appointments, etc.  Non-urgent messages can be sent to your provider as well.   To learn more about what you can do with MyChart, go to [email or web address].    Your next appointment:   1 year(s)  The format for your next appointment:   In Person  Provider:   Virl Axe, MD   Other Instructions - to schedule a COVID-19 vaccine you may: 1) go to the [facility] and click on vaccine scheduling or 2) call [phone]

## 2020-01-15 NOTE — Progress Notes (Signed)
Patient Care Team: [name], MD as PCP - General (Family Medicine) Rockey Situ [name], MD as Consulting Physician (Cardiology)   HPI  Jacqueline Lozano is a 85 y.o. female seen in followup for pacemaker implantation for tachybradycardia syndrome. She has a history of permanent atrial fibrillation and status post CRT P. she is status post AV junction ablation.  LV lead has been permanently turned off because of diaphragmatic stimulation  Underwent generator replacement 7/20.  Previously reported pacemaker pocket tenderness has largely resolved  DATE TEST EF   2/16    Echo   EF 65 % Mild AS Grad 11  10/18    Myoview   EF 70 % No ischemia  2/19 Echo  65-70% Mild AS grad 6    Date Cr K Hgb  4/18 1.4  12.2   1/19 1.36  12.8  4/19 1.4  13.0  6/20 1.4  12.9         Chronic dyspnea.  No edema. No chest pain.  Chronic watery stool.  More recently associated with bleeding.  Reluctant to see her PCP  Past Medical History:  Diagnosis Date  . Abnormal echocardiogram 09/2009   EF >55%, mild LVH, moderate LAE, normal RV, normal PA systolic pressure  . Allergic to IV contrast   . Chronic diastolic CHF (congestive heart failure) ([place])   . Complete heart block-S./P. AV junction ablation 11/23/2010  . Edentulous    Top.  Has most of bottom teeth  . GERD (gastroesophageal reflux disease)   . Hyperlipidemia    Has been unable to take statins due to muscle pain. Has tried multiple statins per her report.  . Hypertension   . Hypothyroidism   . Migraine    none recently  . OSA (obstructive sleep apnea)    Not consistently using CPAP  . Pacemaker-Medtronic CRT P. 11/23/2010   Atrial port was plugged [facility] 12/22/18)  . PAD (peripheral artery disease) (HCC)    Occlusive disease involving left PT and AT  . Paroxysmal atrial fibrillation (HCC)    Breakthrough a fib on sotalol, changed to amiodarone 4/11. DCCV 4/11  . Uses wheelchair    Able to stand and self transfer.     Past Surgical History:  Procedure Laterality Date  . APPENDECTOMY    . ATRIAL ABLATION SURGERY  06/12/2010  . BIV PACEMAKER GENERATOR CHANGEOUT N/A 12/22/2018   Procedure: BIV PACEMAKER GENERATOR CHANGEOUT;  Surgeon: Deboraha Sprang, MD;  Location: [facility];  Service: Cardiovascular;  Laterality: N/A;  . CARDIAC CATHETERIZATION     Left heart cath x3 in teh past, last 5 yrs ago. All "normal" per pt report  . CARDIOVERSION    . CARPAL TUNNEL RELEASE    . CATARACT EXTRACTION W/PHACO Right 10/29/2019   Procedure: CATARACT EXTRACTION PHACO AND INTRAOCULAR LENS PLACEMENT (IOC) RIGHT 3.87 00:41.2;  Surgeon: Eulogio Bear, MD;  Location: [facility];  Service: Ophthalmology;  Laterality: Right;  sleep apnea  . CATARACT EXTRACTION W/PHACO Left 12/03/2019   Procedure: CATARACT EXTRACTION PHACO AND INTRAOCULAR LENS PLACEMENT (IOC) LEFT 6.02  00:51.3;  Surgeon: Eulogio Bear, MD;  Location: [place];  Service: Ophthalmology;  Laterality: Left;  sleep apnea  . CHOLECYSTECTOMY OPEN    . COLONOSCOPY WITH PROPOFOL N/A 09/08/2015   Procedure: COLONOSCOPY WITH PROPOFOL;  Surgeon: Josefine Class, MD;  Location: [facility] ENDOSCOPY;  Service: Endoscopy;  Laterality: N/A;  . ESOPHAGOGASTRODUODENOSCOPY (EGD) WITH PROPOFOL N/A 09/08/2015  Procedure: ESOPHAGOGASTRODUODENOSCOPY (EGD) WITH PROPOFOL;  Surgeon: Josefine Class, MD;  Location: [facility] ENDOSCOPY;  Service: Endoscopy;  Laterality: N/A;  . PACEMAKER INSERTION  06/12/2010  . VESICOVAGINAL FISTULA CLOSURE W/ TAH      Current Outpatient Medications  Medication Sig Dispense Refill  . cyanocobalamin (,VITAMIN B-12,) 1000 MCG/ML injection Inject into the muscle.    . diphenoxylate-atropine (LOMOTIL) 2.5-0.025 MG tablet Take by mouth.    Arne Cleveland 5 MG TABS tablet Take 1 tablet by mouth twice daily 180 tablet 1  . famotidine (PEPCID) 10 MG tablet Take 10 mg by mouth daily as needed for heartburn or indigestion. Reported  on 08/19/2015    . furosemide (LASIX) 20 MG tablet Take 1 tablet (20 mg total) by mouth as directed. Take once daily with extra dose after lunch if you have leg swelling. 180 tablet 3  . GuaiFENesin (MUCINEX PO) Take 1 tablet by mouth 2 (two) times daily as needed (cold symptoms).     Marland Kitchen ibuprofen (ADVIL) 200 MG tablet Take 200 mg by mouth every 6 (six) hours as needed for headache or mild pain.    Marland Kitchen levothyroxine (SYNTHROID) 75 MCG tablet Take 75 mcg by mouth daily before breakfast.    . meclizine (ANTIVERT) 25 MG tablet TAKE ONE TABLET BY MOUTH THREE TIMES DAILY AS NEEDED (Patient taking differently: Take 25 mg by mouth 3 (three) times daily as needed for dizziness. ) 90 tablet 1  . metoprolol tartrate (LOPRESSOR) 50 MG tablet TAKE 1 & 1/2 (ONE & ONE-HALF) TABLETS BY MOUTH TWICE DAILY 270 tablet 3  . nitroGLYCERIN (NITROSTAT) 0.4 MG SL tablet Place 1 tablet (0.4 mg total) under the tongue every 5 (five) minutes as needed for chest pain. 25 tablet 1  . potassium chloride (KLOR-CON) 10 MEQ tablet Take 1 tablet (10 mEq total) by mouth daily. 180 tablet 3  . prochlorperazine (COMPAZINE) 5 MG tablet Take 1 tablet (5 mg total) by mouth every 6 (six) hours as needed. (Patient taking differently: Take 5 mg by mouth every 6 (six) hours as needed for nausea or vomiting. ) 90 tablet 5   No current facility-administered medications for this visit.    Allergies  Allergen Reactions  . Codeine Shortness Of Breath  . Iodinated Diagnostic Agents Shortness Of Breath and Other (See Comments)  . Morphine Shortness Of Breath  . Other Shortness Of Breath, Nausea And Vomiting and Other (See Comments)    Contrast dye  . Oxycodone Shortness Of Breath  . Shellfish-Derived Products Shortness Of Breath, Nausea And Vomiting and Rash  . Dipyridamole Other (See Comments)    Unknown   . Statins Other (See Comments)    Unknown  . Sulfonamide Derivatives Other (See Comments)    Unknown  . Amlodipine Rash and Other (See  Comments)    Review of Systems negative except from HPI and PMH  Physical Exam BP 130/62 (BP Location: Left Arm, Patient Position: Sitting, Cuff Size: Normal)   Pulse 76   Ht 5\' 3"  (1.6 m)   Wt 189 lb 4 oz (85.8 kg)   SpO2 98%   BMI 33.52 kg/m  Well developed and obese in no acute distress HENT normal Neck supple with JVP-flat Clear Device pocket well healed; without hematoma or erythema.  There is no tethering  Regular rate and rhythm, no  murmur Abd-soft with active BS No Clubbing cyanosis  edema Skin-warm and dry A & Oriented  Grossly normal sensory and motor function  ECG atrial fibrillation with  complete heart block Ventricular pacing  Assessment and  Plan  HFpEF  Atrial fibrillation-permanent  AV junction ablation-complete heart block  Pacemaker-CRT   LV lead inactivated secondary to diaphragmatic stimulation    Aortic stenosis-mild  Hypertension  Nonvaccinated  Diarrhea        Euvolemic continue current meds  Bleeding from her GI tract.  Possibly associate with hemorrhoids.  Encouraged her to follow-up with her PCP and/or her GI physician.  On anticoagulation.  We will check a CBC.  Patient is not vaccinated.  We have discussed the merits of vaccination particularly given her comorbidities and age, obesity, lung disease.  We have also discussed side effects.  I encouraged her to follow through  Blood pressure well controlled  Encouraged ongoing weight loss.        No report for for 5 months related to the question of

## 2020-01-16 LAB — BASIC METABOLIC PANEL
BUN/Creatinine Ratio: 16 (ref 12–28)
BUN: 24 mg/dL (ref 8–27)
CO2: 20 mmol/L (ref 20–29)
Calcium: 9.7 mg/dL (ref 8.7–10.3)
Chloride: 104 mmol/L (ref 96–106)
Creatinine, Ser: 1.52 mg/dL — ABNORMAL HIGH (ref 0.57–1.00)
GFR calc Af Amer: 36 mL/min/{1.73_m2} — ABNORMAL LOW (ref >59)
GFR calc non Af Amer: 31 mL/min/{1.73_m2} — ABNORMAL LOW (ref >59)
Glucose: 92 mg/dL (ref 65–99)
Potassium: 4.5 mmol/L (ref 3.5–5.2)
Sodium: 140 mmol/L (ref 134–144)

## 2020-01-16 LAB — CBC WITH DIFFERENTIAL/PLATELET
Basophils Absolute: 0.1 10*3/uL (ref 0.0–0.2)
Basos: 1 %
EOS (ABSOLUTE): 0 10*3/uL (ref 0.0–0.4)
Eos: 0 %
Hematocrit: 35.2 % (ref 34.0–46.6)
Hemoglobin: 12.1 g/dL (ref 11.1–15.9)
Immature Grans (Abs): 0 10*3/uL (ref 0.0–0.1)
Immature Granulocytes: 0 %
Lymphocytes Absolute: 2.1 10*3/uL (ref 0.7–3.1)
Lymphs: 23 %
MCH: 31.3 pg (ref 26.6–33.0)
MCHC: 34.4 g/dL (ref 31.5–35.7)
MCV: 91 fL (ref 79–97)
Monocytes Absolute: 1 10*3/uL — ABNORMAL HIGH (ref 0.1–0.9)
Monocytes: 11 %
Neutrophils Absolute: 6 10*3/uL (ref 1.4–7.0)
Neutrophils: 65 %
Platelets: 245 10*3/uL (ref 150–450)
RBC: 3.87 x10E6/uL (ref 3.77–5.28)
RDW: 13.5 % (ref 11.7–15.4)
WBC: 9.2 10*3/uL (ref 3.4–10.8)

## 2020-01-28 ENCOUNTER — Ambulatory Visit (INDEPENDENT_AMBULATORY_CARE_PROVIDER_SITE_OTHER): Payer: Medicare Other

## 2020-01-28 DIAGNOSIS — I5032 Chronic diastolic (congestive) heart failure: Secondary | ICD-10-CM | POA: Diagnosis not present

## 2020-01-28 DIAGNOSIS — Z950 Presence of cardiac pacemaker: Secondary | ICD-10-CM

## 2020-01-30 NOTE — Progress Notes (Signed)
EPIC Encounter for ICM Monitoring  Patient Name: Jacqueline Lozano is a 85 y.o. female Date: 01/30/2020 Primary Care Physican: [name], MD Primary Cardiologist:[name] Electrophysiologist: Vergie Living Pacing: 99.7% LastWeight: unknown  Spoke with patient and reports feeling well at this time.  Denies fluid symptoms.    Optivolthoracic impedancesuggesting normal fluid levels.  Prescribed: Furosemide 20 mgTake 1 tablet (20 mg total) by mouth as directed. Take once daily with extra dose after lunch if you have leg swelling. Potassium 10 mEq 1 tablet daily  Recommendations:No changes and encouraged to call if experiencing any fluid symptoms.  Follow-up plan: ICM clinic phone appointment on9/27/2021. 91 day device clinic remote transmission10/21/2021.   EP/Cardiology Office Visits: Recall for 09/24/2020 with Dr Rockey Situ.  Recall 01/14/2021 with Dr. Caryl Comes.    Copy of ICM check sent to Dr. Caryl Comes.    3 month ICM trend: 01/28/2020    1 Year ICM trend:       Rosalene Billings, RN 01/30/2020 2:49 PM

## 2020-02-12 ENCOUNTER — Other Ambulatory Visit: Payer: Self-pay | Admitting: *Deleted

## 2020-02-12 MED ORDER — METOPROLOL TARTRATE 50 MG PO TABS: ORAL_TABLET | ORAL | 2 refills | Status: DC

## 2020-02-29 ENCOUNTER — Other Ambulatory Visit: Payer: Self-pay | Admitting: Cardiovascular Disease

## 2020-03-03 ENCOUNTER — Ambulatory Visit (INDEPENDENT_AMBULATORY_CARE_PROVIDER_SITE_OTHER): Payer: Medicare Other

## 2020-03-03 DIAGNOSIS — Z950 Presence of cardiac pacemaker: Secondary | ICD-10-CM

## 2020-03-03 DIAGNOSIS — I5032 Chronic diastolic (congestive) heart failure: Secondary | ICD-10-CM | POA: Diagnosis not present

## 2020-03-04 NOTE — Progress Notes (Signed)
EPIC Encounter for ICM Monitoring  Patient Name: Jacqueline BELONGIA is a 85 y.o. female Date: 03/04/2020 Primary Care Physican: [name], MD Primary Cardiologist:[name] Electrophysiologist: Vergie Living Pacing: 99.5% LastWeight: unknown  Spoke with patient and reports feeling well at this time. Denies fluid symptoms.   Optivolthoracic impedancesuggesting normal fluid levels.  Prescribed: Furosemide 20 mgTake 1 tablet (20 mg total) by mouth as directed. Take once daily with extra dose after lunch if you have leg swelling. Potassium 10 mEq 1 tablet daily  Recommendations:No changes and encouraged to call if experiencing any fluid symptoms.  Follow-up plan: ICM clinic phone appointment on11/06/2019. 91 day device clinic remote transmission10/21/2021.   EP/Cardiology Office Visits: Recall for 09/24/2020 with Dr Rockey Situ.  Recall 01/14/2021 with Dr.Klein.   Copy of ICM check sent to [place].    3 month ICM trend: 03/03/2020          1 Year ICM trend:       Rosalene Billings, RN 03/04/2020 12:54 PM

## 2020-03-27 ENCOUNTER — Ambulatory Visit (INDEPENDENT_AMBULATORY_CARE_PROVIDER_SITE_OTHER): Payer: Medicare Other

## 2020-03-27 DIAGNOSIS — I442 Atrioventricular block, complete: Secondary | ICD-10-CM

## 2020-03-27 LAB — CUP PACEART REMOTE DEVICE CHECK
Battery Remaining Longevity: 120 mo
Battery Voltage: 3.02 V
Brady Statistic AP VP Percent: 0 %
Brady Statistic AP VS Percent: 0 %
Brady Statistic AS VP Percent: 99.1 %
Brady Statistic AS VS Percent: 0.9 %
Brady Statistic RA Percent Paced: 0 %
Brady Statistic RV Percent Paced: 99.1 %
Date Time Interrogation Session: 20211021065209
Implantable Lead Implant Date: 20120106
Implantable Lead Implant Date: 20120106
Implantable Lead Location: 753858
Implantable Lead Location: 753860
Implantable Lead Model: 5076
Implantable Pulse Generator Implant Date: 20200717
Lead Channel Impedance Value: 1045 Ohm
Lead Channel Impedance Value: 3306 Ohm
Lead Channel Impedance Value: 3306 Ohm
Lead Channel Impedance Value: 380 Ohm
Lead Channel Impedance Value: 437 Ohm
Lead Channel Impedance Value: 627 Ohm
Lead Channel Impedance Value: 627 Ohm
Lead Channel Impedance Value: 684 Ohm
Lead Channel Impedance Value: 741 Ohm
Lead Channel Pacing Threshold Amplitude: 0.75 V
Lead Channel Pacing Threshold Pulse Width: 0.4 ms
Lead Channel Setting Pacing Amplitude: 2.5 V
Lead Channel Setting Pacing Pulse Width: 0.4 ms
Lead Channel Setting Sensing Sensitivity: 2.8 mV

## 2020-04-02 NOTE — Progress Notes (Signed)
Remote pacemaker transmission.   

## 2020-04-07 ENCOUNTER — Ambulatory Visit (INDEPENDENT_AMBULATORY_CARE_PROVIDER_SITE_OTHER): Payer: Medicare Other

## 2020-04-07 DIAGNOSIS — Z950 Presence of cardiac pacemaker: Secondary | ICD-10-CM | POA: Diagnosis not present

## 2020-04-07 DIAGNOSIS — I5032 Chronic diastolic (congestive) heart failure: Secondary | ICD-10-CM | POA: Diagnosis not present

## 2020-04-09 NOTE — Progress Notes (Signed)
EPIC Encounter for ICM Monitoring  Patient Name: Jacqueline Lozano is a 85 y.o. female Date: 04/09/2020 Primary Care Physican: [name], MD Primary Cardiologist:[name] Electrophysiologist: Vergie Living Pacing: 98.5% LastWeight: unknown  Spoke with patient and reports feeling well at this time. Denies fluid symptoms.   Optivolthoracic impedancesuggesting possible fluid accumulation from 03/24/2020 - 04/07/2020 but returned to normal on 04/07/2020.  Prescribed: Furosemide 20 mgTake 1 tablet (20 mg total) by mouth as directed. Take once daily with extra dose after lunch if you have leg swelling. Potassium10 mEq 1 tablet daily  Recommendations:No changes and encouraged to call if experiencing any fluid symptoms.  Follow-up plan: ICM clinic phone appointment on12/11/2019. 91 day device clinic remote transmission1/20/2022.   EP/Cardiology Office Visits:Recall for 09/24/2020 with Dr Rockey Situ. Recall 8/10/2022with Dr.Klein.   Copy of ICM check sent to [place].  3 month ICM trend: 04/07/2020    1 Year ICM trend:       Rosalene Billings, RN 04/09/2020 4:16 PM

## 2020-05-12 ENCOUNTER — Ambulatory Visit (INDEPENDENT_AMBULATORY_CARE_PROVIDER_SITE_OTHER): Payer: Medicare Other

## 2020-05-12 DIAGNOSIS — Z950 Presence of cardiac pacemaker: Secondary | ICD-10-CM | POA: Diagnosis not present

## 2020-05-12 DIAGNOSIS — I5032 Chronic diastolic (congestive) heart failure: Secondary | ICD-10-CM

## 2020-05-12 NOTE — Progress Notes (Signed)
EPIC Encounter for ICM Monitoring  Patient Name: Jacqueline Lozano is a 85 y.o. female Date: 05/12/2020 Primary Care Physican: [name], MD Primary Cardiologist:[name] Electrophysiologist: Vergie Living Pacing: 99.3% LastWeight: unknown  Spoke with patient and reports feeling well at this time. Denies fluid symptoms. She ate high salted meal over the weekend.   Optivolthoracic impedancesuggesting possible fluid accumulation starting 05/10/2020.  Prescribed:  Furosemide 20 mgTake 1 tablet (20 mg total) by mouth as directed. Take once daily with extra dose after lunch if you have leg swelling.  Potassium10 mEq 1 tablet daily  Recommendations: Advised to take 1 extra Furosemide tablet tomorrow and then return to taking 1 tablet daily.  She verbalized understanding.  Follow-up plan: ICM clinic phone appointment on12/01/2020 to recheck fluid levels. 91 day device clinic remote transmission1/20/2022.   EP/Cardiology Office Visits:Recall for 09/24/2020 with Dr Rockey Situ. Recall 8/10/2022with Dr.Klein.   Copy of ICM check sent to [place].  3 month ICM trend: 05/12/2020    1 Year ICM trend:       Rosalene Billings, RN 05/12/2020 3:02 PM

## 2020-05-19 ENCOUNTER — Ambulatory Visit (INDEPENDENT_AMBULATORY_CARE_PROVIDER_SITE_OTHER): Payer: Medicare Other

## 2020-05-19 DIAGNOSIS — Z950 Presence of cardiac pacemaker: Secondary | ICD-10-CM

## 2020-05-19 DIAGNOSIS — I5032 Chronic diastolic (congestive) heart failure: Secondary | ICD-10-CM

## 2020-05-20 ENCOUNTER — Telehealth: Payer: Self-pay

## 2020-05-20 NOTE — Progress Notes (Signed)
EPIC Encounter for ICM Monitoring  Patient Name: Jacqueline Lozano is a 85 y.o. female Date: 05/20/2020 Primary Care Physican: [name], MD Primary Cardiologist:[name] Electrophysiologist: Vergie Living Pacing: 99.7% LastWeight: unknown  Attempted call to patient and unable to reach.  Transmission reviewed.   Optivolthoracic impedancereturned to normal after taking extra Furosemide.  Prescribed:  Furosemide 20 mgTake 1 tablet (20 mg total) by mouth as directed. Take once daily with extra dose after lunch if you have leg swelling.  Potassium10 mEq 1 tablet daily  Recommendations: Unable to reach.    Follow-up plan: ICM clinic phone appointment on1/03/2021. 91 day device clinic remote transmission1/20/2022.   EP/Cardiology Office Visits:Recall for 09/24/2020 with Dr Rockey Situ. Recall 8/10/2022with Dr.Klein.   Copy of ICM check sent to [place].   3 month ICM trend: 05/19/2020    1 Year ICM trend:       Rosalene Billings, RN 05/20/2020 9:52 AM

## 2020-05-20 NOTE — Telephone Encounter (Signed)
Remote ICM transmission received.  Attempted call to patient regarding ICM remote transmission and no answer.  

## 2020-05-22 ENCOUNTER — Telehealth: Payer: Self-pay | Admitting: Internal Medicine

## 2020-05-22 NOTE — Telephone Encounter (Signed)
Refill Request.  

## 2020-05-23 NOTE — Telephone Encounter (Signed)
Pt last saw Dr Caryl Comes 01/15/20, last labs 05/06/20 Creat 1.5 at [facility], previous labs 01/15/20 Creat 1.52, age 85, weight 85.8kg, based on specified criteria age >106 and Creat >1.5, pt is not on the appropriate dosage of Eliquis 5mg  BID.  Will forward message to Dr Caryl Comes to see if lowering pt's Eliquis dosage to 2.5mg  BID is appropriate.  Will await his response.

## 2020-05-26 NOTE — Telephone Encounter (Signed)
Patients daughter calling to check the status of the refill. Medication is going to run out Tuesday night for patient   Please advise

## 2020-05-27 NOTE — Telephone Encounter (Signed)
I called and spoke with the patient's daughter.  I have advised her that Dr. Caryl Comes has advised we decrease her eliquis dose down to 2.5 mg BID based on her age and renal function.   She is aware that I have sent this refill to the pharmacy.

## 2020-05-27 NOTE — Telephone Encounter (Signed)
Deboraha Sprang, MD  Brynda Peon, RN; Emily Filbert, RN Erika   Always a little tricky. She has been up and about 1.5. Most recent, 12/20 was 1.5 exactly.    She has no history of stroke, I think it is probably the right thing to go to 2.5 twice daily. Thank you for your thoughts on this.

## 2020-05-27 NOTE — Telephone Encounter (Signed)
Patient is out of meds. Please advise daughter

## 2020-05-27 NOTE — Telephone Encounter (Signed)
Dr. Caryl Comes please review & advise on the Eliquis refill/ dosing.  The patient is out of meds today.

## 2020-06-16 ENCOUNTER — Ambulatory Visit (INDEPENDENT_AMBULATORY_CARE_PROVIDER_SITE_OTHER): Payer: Medicare Other

## 2020-06-16 DIAGNOSIS — Z950 Presence of cardiac pacemaker: Secondary | ICD-10-CM | POA: Diagnosis not present

## 2020-06-16 DIAGNOSIS — I5032 Chronic diastolic (congestive) heart failure: Secondary | ICD-10-CM | POA: Diagnosis not present

## 2020-06-18 NOTE — Progress Notes (Signed)
EPIC Encounter for ICM Monitoring  Patient Name: Jacqueline Lozano is a 86 y.o. female Date: 06/18/2020 Primary Care Physican: [name], MD Primary Cardiologist:[name] Electrophysiologist: Vergie Living Pacing: 99.7% LastWeight: unknown  Spoke with patient and reports feeling well at this time.  Denies fluid symptoms.    Optivolthoracic impedancenormal.  Prescribed:  Furosemide 20 mgTake 1 tablet (20 mg total) by mouth as directed. Take once daily with extra dose after lunch if you have leg swelling.  Potassium10 mEq 1 tablet daily  Recommendations: No changes and encouraged to call if experiencing any fluid symptoms.  Follow-up plan: ICM clinic phone appointment on2/21/2022. 91 day device clinic remote transmission1/20/2022.   EP/Cardiology Office Visits:Recall for 09/24/2020 with Dr Rockey Situ. Recall 8/10/2022with Dr.Klein.   Copy of ICM check sent to [place].   3 month ICM trend: 06/16/2020.    1 Year ICM trend:       Rosalene Billings, RN 06/18/2020 12:13 PM

## 2020-06-26 ENCOUNTER — Ambulatory Visit (INDEPENDENT_AMBULATORY_CARE_PROVIDER_SITE_OTHER): Payer: Medicare Other

## 2020-06-26 DIAGNOSIS — I442 Atrioventricular block, complete: Secondary | ICD-10-CM

## 2020-06-26 LAB — CUP PACEART REMOTE DEVICE CHECK
Battery Remaining Longevity: 117 mo
Battery Voltage: 3.01 V
Brady Statistic AP VP Percent: 0 %
Brady Statistic AP VS Percent: 0 %
Brady Statistic AS VP Percent: 99.7 %
Brady Statistic AS VS Percent: 0.3 %
Brady Statistic RA Percent Paced: 0 %
Brady Statistic RV Percent Paced: 99.7 %
Date Time Interrogation Session: 20220119235732
Implantable Lead Implant Date: 20120106
Implantable Lead Implant Date: 20120106
Implantable Lead Location: 753858
Implantable Lead Location: 753860
Implantable Lead Model: 5076
Implantable Pulse Generator Implant Date: 20200717
Lead Channel Impedance Value: 1235 Ohm
Lead Channel Impedance Value: 3306 Ohm
Lead Channel Impedance Value: 3306 Ohm
Lead Channel Impedance Value: 380 Ohm
Lead Channel Impedance Value: 437 Ohm
Lead Channel Impedance Value: 684 Ohm
Lead Channel Impedance Value: 703 Ohm
Lead Channel Impedance Value: 798 Ohm
Lead Channel Impedance Value: 817 Ohm
Lead Channel Pacing Threshold Amplitude: 0.625 V
Lead Channel Pacing Threshold Pulse Width: 0.4 ms
Lead Channel Setting Pacing Amplitude: 2.5 V
Lead Channel Setting Pacing Pulse Width: 0.4 ms
Lead Channel Setting Sensing Sensitivity: 2.8 mV

## 2020-07-09 NOTE — Progress Notes (Signed)
Remote pacemaker transmission.   

## 2020-07-28 ENCOUNTER — Ambulatory Visit (INDEPENDENT_AMBULATORY_CARE_PROVIDER_SITE_OTHER): Payer: Medicare Other

## 2020-07-28 DIAGNOSIS — Z950 Presence of cardiac pacemaker: Secondary | ICD-10-CM | POA: Diagnosis not present

## 2020-07-28 DIAGNOSIS — I5032 Chronic diastolic (congestive) heart failure: Secondary | ICD-10-CM

## 2020-08-05 NOTE — Progress Notes (Signed)
EPIC Encounter for ICM Monitoring  Patient Name: Jacqueline Lozano is a 86 y.o. female Date: 08/05/2020 Primary Care Physican: [name], MD Primary Cardiologist:[name] Electrophysiologist: Vergie Living Pacing: 99.5% LastWeight: unknown  Transmission reviewed.  Optivolthoracic impedancenormal.  Prescribed:  Furosemide 20 mgTake 1 tablet (20 mg total) by mouth as directed. Take once daily with extra dose after lunch if you have leg swelling.  Potassium10 mEq 1 tablet daily  Recommendations: No changes.  Follow-up plan: ICM clinic phone appointment on4/09/2020. 91 day device clinic remote transmission4/21/2022.   EP/Cardiology Office Visits:Recall for 09/24/2020 with Dr Rockey Situ. Recall 8/10/2022with Dr.Klein.   Copy of ICM check sent to [place].  3 month ICM trend: 07/28/2020.    1 Year ICM trend:       Rosalene Billings, RN 08/05/2020 5:02 PM

## 2020-08-14 ENCOUNTER — Other Ambulatory Visit: Payer: Self-pay | Admitting: Cardiovascular Disease

## 2020-09-08 ENCOUNTER — Ambulatory Visit (INDEPENDENT_AMBULATORY_CARE_PROVIDER_SITE_OTHER): Payer: Medicare Other

## 2020-09-08 DIAGNOSIS — Z950 Presence of cardiac pacemaker: Secondary | ICD-10-CM | POA: Diagnosis not present

## 2020-09-08 DIAGNOSIS — I5032 Chronic diastolic (congestive) heart failure: Secondary | ICD-10-CM | POA: Diagnosis not present

## 2020-09-09 NOTE — Progress Notes (Signed)
EPIC Encounter for ICM Monitoring  Patient Name: Jacqueline Lozano is a 86 y.o. female Date: 09/09/2020 Primary Care Physican: [name], MD Primary Cardiologist:[name] Electrophysiologist: Vergie Living Pacing: 99.6% LastWeight: unknown  Spoke with patient and reports feeling well at this time.  Denies fluid symptoms.    Optivolthoracic impedancenormal.  Prescribed:  Furosemide 20 mgTake 1 tablet (20 mg total) by mouth as directed. Take once daily with extra dose after lunch if you have leg swelling.  Potassium10 mEq 1 tablet daily  Recommendations: No changes and encouraged to call if experiencing any fluid symptoms.  Follow-up plan: ICM clinic phone appointment on5/02/2021. 91 day device clinic remote transmission4/21/2022.   EP/Cardiology Office Visits:Recall for 09/29/2020 with Dr Rockey Situ. Recall 8/10/2022with Dr.Klein.   Copy of ICM check sent to [place].  3 month ICM trend: 09/08/2020.    1 Year ICM trend:       Rosalene Billings, RN 09/09/2020 1:01 PM

## 2020-09-25 ENCOUNTER — Ambulatory Visit (INDEPENDENT_AMBULATORY_CARE_PROVIDER_SITE_OTHER): Payer: Medicare Other

## 2020-09-25 DIAGNOSIS — I442 Atrioventricular block, complete: Secondary | ICD-10-CM | POA: Diagnosis not present

## 2020-09-25 LAB — CUP PACEART REMOTE DEVICE CHECK
Battery Remaining Longevity: 115 mo
Battery Voltage: 3.01 V
Brady Statistic AP VP Percent: 0 %
Brady Statistic AP VS Percent: 0 %
Brady Statistic AS VP Percent: 99.47 %
Brady Statistic AS VS Percent: 0.53 %
Brady Statistic RA Percent Paced: 0 %
Brady Statistic RV Percent Paced: 99.47 %
Date Time Interrogation Session: 20220420205253
Implantable Lead Implant Date: 20120106
Implantable Lead Implant Date: 20120106
Implantable Lead Location: 753858
Implantable Lead Location: 753860
Implantable Lead Model: 5076
Implantable Pulse Generator Implant Date: 20200717
Lead Channel Impedance Value: 1273 Ohm
Lead Channel Impedance Value: 3306 Ohm
Lead Channel Impedance Value: 3306 Ohm
Lead Channel Impedance Value: 380 Ohm
Lead Channel Impedance Value: 437 Ohm
Lead Channel Impedance Value: 665 Ohm
Lead Channel Impedance Value: 684 Ohm
Lead Channel Impedance Value: 779 Ohm
Lead Channel Impedance Value: 779 Ohm
Lead Channel Pacing Threshold Amplitude: 0.625 V
Lead Channel Pacing Threshold Pulse Width: 0.4 ms
Lead Channel Setting Pacing Amplitude: 2.5 V
Lead Channel Setting Pacing Pulse Width: 0.4 ms
Lead Channel Setting Sensing Sensitivity: 2.8 mV

## 2020-09-28 NOTE — Progress Notes (Signed)
Date:  09/29/2020   ID:  Jacqueline Lozano, DOB [birth year], MRN [id number]  Patient Location:  [place] [id number]   Provider location:   [facility], [place] office  PCP:  [name], MD  Cardiologist:  Arvid Right [facility]  Chief Complaint  Patient presents with  . 12 month follow up     "doing well." Medications reviewed by the patient verbally.     History of Present Illness:    Jacqueline Lozano is a 86 y.o. female past medical history of morbid obesity, recent weight loss Chronic bilateral knee pain who has declined joint surgery Permanent atrial fibrillation,  status post arteriovenous node ablation, Pacer/CRT-P, ventricularly paced Hypertension,  Hypothyroidism,  Diastolic congestive heart failure, chronic shortness of breath C. difficile infection, chronic diarrhea Echo 07/2014: mild AS Carotid: plaque on the right in 2014 Prior PFT's shows restrictive lung disease likely from her obseity Chronic shortness of breath multifactorial including AV node ablation, paced rhythm, old obesity, deconditioning, restrictive lung disease, aortic valve stenosis who presents for followup of her chronic shortness of breath, pacemaker, atrial fibrillation   LOV 09/2019  In follow-up today she presents with her daughter Again Long discussion concerning her chronic shortness of breath Daughter reports that she does not ambulate, not even much around the house " Too short of breath" She has had extensive work-up, seen pulmonary Denies any improvement with weight loss  Has difficulty standing to transition from chair to wheelchair, gait instability  Last Echo 2019 reviewed Normal EF  Numerous other issues discussed Hemorrhoids, chronic diarrhea She does have history of C. difficile infection in the past Lomotil on her prescription was  Some "indigestion pain"/chest pain, once a month Was once every 3 to 4 months No trigger,  presents at rest Has old nitro  Dizziness, when moving head quickly Daughter thinks it might be vertigo  Finger  locking, bothers her Family with "autoimmune deficiency", wonders if she could have something similar  Still missing her teeth, eating less, weight continues to decline slowly  On previous visits family reported that she Spends much of the time in the bedroom, has a small kitchenette in her bedroom, gets her coffee and cake for breakfast Sometimes comes out of the room into the kitchen area Does not walk at home Uses a walker  Statin intolerance  EKG personally reviewed by myself on todays visit Shows paced rhythm rate 74 beats per minute  Rate response previously tested in the office, was appropriate  hit 98 bpm walking down the hall, no drop in oxygen saturation, maintained for the 95%. She was very short of breath, had difficulty walking back to the room, had to walk very slowly, take breaks  Unable to exercise Unable to participate in pulmonary rehabilitation given severe chronic knee pain  Stress test 03/2017: normal study   Prior CV studies:   The following studies were reviewed today:  Previous echocardiogram 2016 showed mild to moderate aortic valve stenosis, normal EF, normal right heart pressures  echocardiogram shows normal LV function, stable valve disease, normal right ventricular systolic pressures.  Previous visit to the emergency room for atypical chest pain. Cardiac enzymes were negative and she was discharged home Prior workup with echocardiogram in 2014 and stress test were unrevealing.  sleep study in October and again in November. Sleep study in October suggested she repeat the test as it was incomplete. The note indicates the study was discontinued as the patient  had heart block, with limited EKG monitoring appearing to demonstrate a 43 second period of complete heart block with ventricular standstill. Atrial rate was 80 beats per minute.  Note indicates the patient was awake and alert during the event with some dizziness. Pacer was checked after this event, Normal functioning device.  Followup sleep study 04/24/2013 recommended weight loss, though there was no indications for CPAP at this time. Previous stress test early 2014 showed no ischemia    Past Medical History:  Diagnosis Date  . Abnormal echocardiogram 09/2009   EF >55%, mild LVH, moderate LAE, normal RV, normal PA systolic pressure  . Allergic to IV contrast   . Chronic diastolic CHF (congestive heart failure) ([place])   . Complete heart block-S./P. AV junction ablation 11/23/2010  . Edentulous    Top.  Has most of bottom teeth  . GERD (gastroesophageal reflux disease)   . Hyperlipidemia    Has been unable to take statins due to muscle pain. Has tried multiple statins per her report.  . Hypertension   . Hypothyroidism   . Migraine    none recently  . OSA (obstructive sleep apnea)    Not consistently using CPAP  . Pacemaker-Medtronic CRT P. 11/23/2010   Atrial port was plugged [facility] 12/22/18)  . PAD (peripheral artery disease) (HCC)    Occlusive disease involving left PT and AT  . Paroxysmal atrial fibrillation (HCC)    Breakthrough a fib on sotalol, changed to amiodarone 4/11. DCCV 4/11  . Uses wheelchair    Able to stand and self transfer.   Past Surgical History:  Procedure Laterality Date  . APPENDECTOMY    . ATRIAL ABLATION SURGERY  06/12/2010  . BIV PACEMAKER GENERATOR CHANGEOUT N/A 12/22/2018   Procedure: BIV PACEMAKER GENERATOR CHANGEOUT;  Surgeon: Deboraha Sprang, MD;  Location: [place] [facility];  Service: Cardiovascular;  Laterality: N/A;  . CARDIAC CATHETERIZATION     Left heart cath x3 in teh past, last 5 yrs ago. All "normal" per pt report  . CARDIOVERSION    . CARPAL TUNNEL RELEASE    . CATARACT EXTRACTION W/PHACO Right 10/29/2019   Procedure: CATARACT EXTRACTION PHACO AND INTRAOCULAR LENS PLACEMENT (IOC) RIGHT 3.87 00:41.2;   Surgeon: Eulogio Bear, MD;  Location: [place];  Service: Ophthalmology;  Laterality: Right;  sleep apnea  . CATARACT EXTRACTION W/PHACO Left 12/03/2019   Procedure: CATARACT EXTRACTION PHACO AND INTRAOCULAR LENS PLACEMENT (IOC) LEFT 6.02  00:51.3;  Surgeon: Eulogio Bear, MD;  Location: [place];  Service: Ophthalmology;  Laterality: Left;  sleep apnea  . CHOLECYSTECTOMY OPEN    . COLONOSCOPY WITH PROPOFOL N/A 09/08/2015   Procedure: COLONOSCOPY WITH PROPOFOL;  Surgeon: Josefine Class, MD;  Location: [facility] ENDOSCOPY;  Service: Endoscopy;  Laterality: N/A;  . ESOPHAGOGASTRODUODENOSCOPY (EGD) WITH PROPOFOL N/A 09/08/2015   Procedure: ESOPHAGOGASTRODUODENOSCOPY (EGD) WITH PROPOFOL;  Surgeon: Josefine Class, MD;  Location: [facility] ENDOSCOPY;  Service: Endoscopy;  Laterality: N/A;  . PACEMAKER INSERTION  06/12/2010  . VESICOVAGINAL FISTULA CLOSURE W/ TAH       Current Meds  Medication Sig  . apixaban (ELIQUIS) 2.5 MG TABS tablet Take 1 tablet (2.5 mg total) by mouth 2 (two) times daily.  . cyanocobalamin (,VITAMIN B-12,) 1000 MCG/ML injection Inject into the muscle.  . diphenoxylate-atropine (LOMOTIL) 2.5-0.025 MG tablet Take by mouth.  . famotidine (PEPCID) 10 MG tablet Take 10 mg by mouth daily as needed for heartburn or indigestion. Reported on 08/19/2015  . furosemide (LASIX)  20 MG tablet TAKE 1 TABLET BY MOUTH ONCE DAILY AS DIRECTED. TAKE A SECOND TABLET AFTER LUNCH IF YOU HAVE LEG SWELLING.  . GuaiFENesin (MUCINEX PO) Take 1 tablet by mouth 2 (two) times daily as needed (cold symptoms).   Marland Kitchen ibuprofen (ADVIL) 200 MG tablet Take 200 mg by mouth every 6 (six) hours as needed for headache or mild pain.  Marland Kitchen levothyroxine (SYNTHROID) 75 MCG tablet Take 75 mcg by mouth daily before breakfast.  . meclizine (ANTIVERT) 25 MG tablet TAKE ONE TABLET BY MOUTH THREE TIMES DAILY AS NEEDED  . metoprolol tartrate (LOPRESSOR) 50 MG tablet TAKE 1 & 1/2 (ONE & ONE-HALF) TABLETS  BY MOUTH TWICE DAILY  . potassium chloride (KLOR-CON) 10 MEQ tablet Take 1 tablet by mouth once daily  . prochlorperazine (COMPAZINE) 5 MG tablet Take 1 tablet (5 mg total) by mouth every 6 (six) hours as needed.  . [DISCONTINUED] nitroGLYCERIN (NITROSTAT) 0.4 MG SL tablet Place 1 tablet (0.4 mg total) under the tongue every 5 (five) minutes as needed for chest pain.     Allergies:   Codeine, Iodinated diagnostic agents, Morphine, Other, Oxycodone, Shellfish-derived products, Dipyridamole, Iodine, Omeprazole, Oxycodone-acetaminophen, Statins, Sulfonamide derivatives, and Amlodipine   Social History   Tobacco Use  . Smoking status: Never Smoker  . Smokeless tobacco: Never Used  . Tobacco comment: pos smoke exposure through father and spouse who smoked in the home with her for many years  Vaping Use  . Vaping Use: Never used  Substance Use Topics  . Alcohol use: No  . Drug use: No     Family Hx: The patient's family history includes Colon cancer in her daughter; Heart attack in her father and mother. There is no history of Stroke.  ROS:   Please see the history of present illness.    Review of Systems  Constitutional: Negative.   Respiratory: Positive for shortness of breath.   Cardiovascular: Negative.   Gastrointestinal: Negative.   Musculoskeletal: Negative.   Neurological: Negative.   Psychiatric/Behavioral: Negative.   All other systems reviewed and are negative.    Labs/Other Tests and Data Reviewed:    Recent Labs: 01/15/2020: BUN 24; Creatinine, Ser 1.52; Hemoglobin 12.1; Platelets 245; Potassium 4.5; Sodium 140   Recent Lipid Panel Lab Results  Component Value Date/Time   CHOL 294 (H) 09/30/2009 09:19 PM   TRIG 459 (H) 09/30/2009 09:19 PM   HDL 44 09/30/2009 09:19 PM   CHOLHDL 6.7 Ratio 09/30/2009 09:19 PM   LDLCALC See Comment mg/dL 09/30/2009 09:19 PM   LDLDIRECT 146 (H) 10/01/2009 08:51 AM    Wt Readings from Last 3 Encounters:  09/29/20 188 lb (85.3 kg)   01/15/20 189 lb 4 oz (85.8 kg)  12/03/19 199 lb 15.3 oz (90.7 kg)     Exam:     BP 140/64 (BP Location: Left Arm, Patient Position: Sitting, Cuff Size: Normal)   Pulse 74   Ht 5' (1.524 m)   Wt 188 lb (85.3 kg)   SpO2 97%   BMI 36.72 kg/m   Constitutional:  oriented to person, place, and time. No distress.  HENT:  Head: Grossly normal Eyes:  no discharge. No scleral icterus.  Neck: No JVD, no carotid bruits  Cardiovascular: Regular rate and rhythm, 2/6 SEM RSB,  Pulmonary/Chest: Clear to auscultation bilaterally, no wheezes or rails Abdominal: Soft.  no distension.  no tenderness.  Musculoskeletal: Normal range of motion Neurological:  normal muscle tone. Coordination normal. No atrophy Skin: Skin warm and dry  Psychiatric: normal affect, pleasant  ASSESSMENT & PLAN:    Atrial fibrillation, unspecified type ([place]) - Plan: EKG 12-Lead On anticoagulation, Prior AV node ablation  Shortness of breath Chronic longstanding issue Echocardiogram and stress test without significant changes with normal EF Symptoms felt to be secondary to deconditioning, obesity, AV node ablation, underlying atrial fibrillation paced rhythm, mild AS, restrictive lung disease Has been refractory to exercise programs/rehab  Pacemaker-Medtronic CRT P. OptiVol frequently monitored, diuretics adjusted based on measurements In general well controlled  Chronic diastolic CHF (congestive heart failure) (HCC)  euvolemic, no change in medication  Complete heart block-S./P. AV junction ablation pacemaker , ventricularly paced Recent pacer battery change  Hyperlipidemia Previously declined statin Weight loss Previously recommended statins and Zetia   Numerous noncardiac issues discussed on today's visit such as her diarrhea, arthritis in her fingers etc. All questions from patient and daughter answered  Total encounter time more than 25 minutes  Greater than 50% was spent in counseling and  coordination of care with the patient    Signed, Ida Rogue, MD  09/29/2020 5:19 PM     [place] Office [address], [place], [place] [address]

## 2020-09-29 ENCOUNTER — Ambulatory Visit (INDEPENDENT_AMBULATORY_CARE_PROVIDER_SITE_OTHER): Payer: Medicare Other | Admitting: Cardiovascular Disease

## 2020-09-29 ENCOUNTER — Other Ambulatory Visit: Payer: Self-pay

## 2020-09-29 VITALS — BP 140/64 | HR 74 | Ht 60.0 in | Wt 188.0 lb

## 2020-09-29 DIAGNOSIS — I4821 Permanent atrial fibrillation: Secondary | ICD-10-CM

## 2020-09-29 DIAGNOSIS — R0602 Shortness of breath: Secondary | ICD-10-CM

## 2020-09-29 DIAGNOSIS — I5032 Chronic diastolic (congestive) heart failure: Secondary | ICD-10-CM

## 2020-09-29 DIAGNOSIS — I442 Atrioventricular block, complete: Secondary | ICD-10-CM | POA: Diagnosis not present

## 2020-09-29 DIAGNOSIS — Z950 Presence of cardiac pacemaker: Secondary | ICD-10-CM | POA: Diagnosis not present

## 2020-09-29 DIAGNOSIS — R079 Chest pain, unspecified: Secondary | ICD-10-CM

## 2020-09-29 DIAGNOSIS — I509 Heart failure, unspecified: Secondary | ICD-10-CM

## 2020-09-29 MED ORDER — NITROGLYCERIN 0.4 MG SL SUBL: 0.4000 mg | SUBLINGUAL_TABLET | SUBLINGUAL | 1 refills | Status: DC | PRN

## 2020-09-29 NOTE — Patient Instructions (Addendum)
Robert Bellow, MD  [phone], [place], [place] [address]  If breast lesion gets worse, please call   Medication Instructions:  No changes  Imodium as needed for diarrhea Zyrtec/cetirazine for allergy   If you need a refill on your cardiac medications before your next appointment, please call your pharmacy.    Lab work: No new labs needed   If you have labs (blood work) drawn today and your tests are completely normal, you will receive your results only by: Marland Kitchen MyChart Message (if you have MyChart) OR . A paper copy in the mail If you have any lab test that is abnormal or we need to change your treatment, we will call you to review the results.   Testing/Procedures: No new testing needed   Follow-Up: At [facility], you and your health needs are our priority.  As part of our continuing mission to provide you with exceptional heart care, we have created designated Provider Care Teams.  These Care Teams include your primary Cardiologist (physician) and Advanced Practice Providers (APPs -  Physician Assistants and Nurse Practitioners) who all work together to provide you with the care you need, when you need it.  . You will need a follow up appointment in 12 months  . Providers on your designated Care Team:   . Murray Hodgkins, NP . Christell Faith, PA-C . Marrianne Mood, PA-C  Any Other Special Instructions Will Be Listed Below (If Applicable).  COVID-19 Vaccine Information can be found at: [email or web address].uk For questions related to vaccine distribution or appointments, please email [email or web address] .[email or web address] or call [phone].

## 2020-10-13 ENCOUNTER — Ambulatory Visit (INDEPENDENT_AMBULATORY_CARE_PROVIDER_SITE_OTHER): Payer: Medicare Other

## 2020-10-13 DIAGNOSIS — Z950 Presence of cardiac pacemaker: Secondary | ICD-10-CM | POA: Diagnosis not present

## 2020-10-13 DIAGNOSIS — I5032 Chronic diastolic (congestive) heart failure: Secondary | ICD-10-CM | POA: Diagnosis not present

## 2020-10-13 NOTE — Progress Notes (Signed)
EPIC Encounter for ICM Monitoring  Patient Name: Jacqueline Lozano is a 86 y.o. female Date: 10/13/2020 Primary Care Physican: [name], MD Primary Cardiologist:[name] Electrophysiologist: Vergie Living Pacing: 99.6% LastWeight: unknown  Spoke with patient.  She said she had some chest pain today and took a nitro with relief.    Optivolthoracic impedancesuggesting possible fluid accumulation starting 10/09/2020.  Prescribed:  Furosemide 20 mgTake 1 tablet (20 mg total) by mouth as directed. Take once daily with extra dose after lunch if you have leg swelling.  Potassium10 mEq 1 tablet daily  Recommendations: Advised to call 911 if nitro does not relief chest pain in the future.  Advised to take Furosemide extra tablet x 1 day.   Follow-up plan: ICM clinic phone appointment on6/20/2022. 91 day device clinic remote transmission7/21/2022.   EP/Cardiology Office Visits:Recall for 09/29/2020 with Dr Rockey Situ. Recall 8/10/2022with Dr.Klein.   Copy of ICM check sent to [facility].   3 month ICM trend: 10/13/2020.    1 Year ICM trend:       Rosalene Billings, RN 10/13/2020 10:21 AM

## 2020-10-14 NOTE — Progress Notes (Signed)
Remote ICD transmission.   

## 2020-11-12 ENCOUNTER — Other Ambulatory Visit: Payer: Self-pay | Admitting: Cardiovascular Disease

## 2020-11-12 ENCOUNTER — Other Ambulatory Visit: Payer: Self-pay | Admitting: Internal Medicine

## 2020-11-12 DIAGNOSIS — I4821 Permanent atrial fibrillation: Secondary | ICD-10-CM

## 2020-11-13 NOTE — Telephone Encounter (Signed)
Eliquis 2.5mg  refill request received. Patient is 86 years old, weight-85.3g, Crea-1.52 on 01/15/2020, Diagnosis-Afib, and last seen by Dr. Rockey Situ on 09/29/20. Dose is appropriate based on dosing criteria. Will send in refill to requested pharmacy.

## 2020-11-24 ENCOUNTER — Ambulatory Visit (INDEPENDENT_AMBULATORY_CARE_PROVIDER_SITE_OTHER): Payer: Medicare Other

## 2020-11-24 DIAGNOSIS — Z950 Presence of cardiac pacemaker: Secondary | ICD-10-CM | POA: Diagnosis not present

## 2020-11-24 DIAGNOSIS — I5032 Chronic diastolic (congestive) heart failure: Secondary | ICD-10-CM | POA: Diagnosis not present

## 2020-11-25 ENCOUNTER — Telehealth: Payer: Self-pay

## 2020-11-25 NOTE — Telephone Encounter (Signed)
Remote ICM transmission received.  Attempted call to patient regarding ICM remote transmission and no answer or voice mail option.  

## 2020-11-25 NOTE — Progress Notes (Signed)
EPIC Encounter for ICM Monitoring  Patient Name: Jacqueline Lozano is a 86 y.o. female Date: 11/25/2020 Primary Care Physican: [name], MD Primary Cardiologist: Rockey Situ Electrophysiologist: Vergie Living Pacing:  99.8%       Last Weight:  unknown   Attempted call to patient and unable to reach.  Transmission reviewed.    Optivol thoracic impedance normal fluid levels.    Prescribed:  Furosemide 20 mg Take 1 tablet (20 mg total) by mouth as directed.  Take once daily with extra dose after lunch if you have leg swelling.   Potassium 10 mEq 1 tablet daily   Recommendations:  Unable to reach.     Follow-up plan: ICM clinic phone appointment on 01/05/2021.  91 day device clinic remote transmission 12/25/2020.     EP/Cardiology Office Visits: Recall for 09/30/2021 with Dr Rockey Situ.  Recall 01/14/2021 with Dr. Caryl Comes.     Copy of ICM check sent to Dr. Caryl Comes.    3 month ICM trend: 11/25/2020.    1 Year ICM trend:       Rosalene Billings, RN 11/25/2020 4:49 PM

## 2020-12-25 ENCOUNTER — Ambulatory Visit (INDEPENDENT_AMBULATORY_CARE_PROVIDER_SITE_OTHER): Payer: Medicare Other

## 2020-12-25 DIAGNOSIS — I442 Atrioventricular block, complete: Secondary | ICD-10-CM | POA: Diagnosis not present

## 2020-12-25 LAB — CUP PACEART REMOTE DEVICE CHECK
Battery Remaining Longevity: 112 mo
Battery Voltage: 3.01 V
Brady Statistic AP VP Percent: 0 %
Brady Statistic AP VS Percent: 0 %
Brady Statistic AS VP Percent: 99.88 %
Brady Statistic AS VS Percent: 0.12 %
Brady Statistic RA Percent Paced: 0 %
Brady Statistic RV Percent Paced: 99.88 %
Date Time Interrogation Session: 20220720221539
Implantable Lead Implant Date: 20120106
Implantable Lead Implant Date: 20120106
Implantable Lead Location: 753858
Implantable Lead Location: 753860
Implantable Lead Model: 5076
Implantable Pulse Generator Implant Date: 20200717
Lead Channel Impedance Value: 1254 Ohm
Lead Channel Impedance Value: 3306 Ohm
Lead Channel Impedance Value: 3306 Ohm
Lead Channel Impedance Value: 380 Ohm
Lead Channel Impedance Value: 437 Ohm
Lead Channel Impedance Value: 703 Ohm
Lead Channel Impedance Value: 722 Ohm
Lead Channel Impedance Value: 798 Ohm
Lead Channel Impedance Value: 817 Ohm
Lead Channel Pacing Threshold Amplitude: 0.75 V
Lead Channel Pacing Threshold Pulse Width: 0.4 ms
Lead Channel Setting Pacing Amplitude: 2.5 V
Lead Channel Setting Pacing Pulse Width: 0.4 ms
Lead Channel Setting Sensing Sensitivity: 2.8 mV

## 2021-01-05 ENCOUNTER — Ambulatory Visit (INDEPENDENT_AMBULATORY_CARE_PROVIDER_SITE_OTHER): Payer: Medicare Other

## 2021-01-05 DIAGNOSIS — Z950 Presence of cardiac pacemaker: Secondary | ICD-10-CM | POA: Diagnosis not present

## 2021-01-05 DIAGNOSIS — I5032 Chronic diastolic (congestive) heart failure: Secondary | ICD-10-CM

## 2021-01-05 NOTE — Progress Notes (Signed)
EPIC Encounter for ICM Monitoring  Patient Name: JEWELENE PILAND is a 86 y.o. female Date: 01/05/2021 Primary Care Physican: [name], MD Primary Cardiologist: Rockey Situ Electrophysiologist: Vergie Living Pacing:  99.9%       Last Weight:  unknown   Spoke with patient and heart failure questions reviewed.  Pt asymptomatic for fluid accumulation and feeling well.   Optivol thoracic impedance normal fluid levels.    Prescribed:  Furosemide 20 mg Take 1 tablet (20 mg total) by mouth as directed.  Take once daily with extra dose after lunch if you have leg swelling.   Potassium 10 mEq 1 tablet daily   Recommendations:  Advised to take extra Furosemide x 1 day.    Follow-up plan: ICM clinic phone appointment on 01/12/2021 to recheck fluid levels.  91 day device clinic remote transmission 03/26/2021.     EP/Cardiology Office Visits: Recall for 09/30/2021 with Dr Rockey Situ.  02/10/2021 with Dr. Caryl Comes.     Copy of ICM check sent to Dr. Caryl Comes.     3 month ICM trend: 01/05/2021.    1 Year ICM trend:       Rosalene Billings, RN 01/05/2021 2:12 PM

## 2021-01-12 ENCOUNTER — Ambulatory Visit (INDEPENDENT_AMBULATORY_CARE_PROVIDER_SITE_OTHER): Payer: Medicare Other

## 2021-01-12 DIAGNOSIS — Z950 Presence of cardiac pacemaker: Secondary | ICD-10-CM

## 2021-01-12 DIAGNOSIS — I5032 Chronic diastolic (congestive) heart failure: Secondary | ICD-10-CM

## 2021-01-12 NOTE — Progress Notes (Signed)
EPIC Encounter for ICM Monitoring  Patient Name: Jacqueline Lozano is a 86 y.o. female Date: 01/12/2021 Primary Care Physican: [name], MD Primary Cardiologist: Rockey Situ Electrophysiologist: Vergie Living Pacing:  99.8%       Last Weight:  unknown   Spoke with patient and heart failure questions reviewed.  Pt asymptomatic for fluid accumulation and feeling well.   Optivol thoracic impedance normal fluid levels.    Prescribed:  Furosemide 20 mg Take 1 tablet (20 mg total) by mouth as directed.  Take once daily with extra dose after lunch if you have leg swelling.   Potassium 10 mEq 1 tablet daily   Recommendations:  No changes and encouraged to call if experiencing any fluid symptoms.   Follow-up plan: ICM clinic phone appointment on 02/10/2021.  91 day device clinic remote transmission 03/26/2021.     EP/Cardiology Office Visits: Recall for 09/30/2021 with Dr Rockey Situ.  02/10/2021 with Dr. Caryl Comes.     Copy of ICM check sent to Dr. Caryl Comes.     3 month ICM trend: 01/12/2021.    1 Year ICM trend:       Rosalene Billings, RN 01/12/2021 4:42 PM

## 2021-01-12 NOTE — Addendum Note (Signed)
Addended by: Rosalene Billings on: 01/12/2021 04:46 PM   Modules accepted: Level of Service

## 2021-01-16 NOTE — Progress Notes (Signed)
Remote pacemaker transmission.   

## 2021-02-10 ENCOUNTER — Encounter: Payer: Self-pay | Admitting: Internal Medicine

## 2021-02-10 ENCOUNTER — Other Ambulatory Visit: Payer: Self-pay

## 2021-02-10 ENCOUNTER — Ambulatory Visit (INDEPENDENT_AMBULATORY_CARE_PROVIDER_SITE_OTHER): Payer: Medicare Other | Admitting: Internal Medicine

## 2021-02-10 VITALS — BP 120/66 | HR 81 | Ht 60.0 in | Wt 186.0 lb

## 2021-02-10 DIAGNOSIS — I442 Atrioventricular block, complete: Secondary | ICD-10-CM | POA: Diagnosis not present

## 2021-02-10 DIAGNOSIS — Z950 Presence of cardiac pacemaker: Secondary | ICD-10-CM

## 2021-02-10 DIAGNOSIS — I5032 Chronic diastolic (congestive) heart failure: Secondary | ICD-10-CM | POA: Diagnosis not present

## 2021-02-10 DIAGNOSIS — I4821 Permanent atrial fibrillation: Secondary | ICD-10-CM | POA: Diagnosis not present

## 2021-02-10 DIAGNOSIS — R0602 Shortness of breath: Secondary | ICD-10-CM

## 2021-02-10 MED ORDER — DAPAGLIFLOZIN PROPANEDIOL 10 MG PO TABS: 10.0000 mg | ORAL_TABLET | Freq: Every day | ORAL | 6 refills | Status: DC

## 2021-02-10 NOTE — Patient Instructions (Signed)
Medication Instructions: \ Your physician has recommended you make the following change in your medication:  Start Farxiga 10 mg once daily.  *If you need a refill on your cardiac medications before your next appointment, please call your pharmacy*   Lab Work: None Ordered If you have labs (blood work) drawn today and your tests are completely normal, you will receive your results only by: [facility] (if you have MyChart) OR A paper copy in the mail If you have any lab test that is abnormal or we need to change your treatment, we will call you to review the results.   Testing/Procedures: Your physician has requested that you have an echocardiogram. Echocardiography is a painless test that uses sound waves to create images of your heart. It provides your doctor with information about the size and shape of your heart and how well your heart's chambers and valves are working. This procedure takes approximately one hour. There are no restrictions for this procedure.    Follow-Up: At [facility], you and your health needs are our priority.  As part of our continuing mission to provide you with exceptional heart care, we have created designated Provider Care Teams.  These Care Teams include your primary Cardiologist (physician) and Advanced Practice Providers (APPs -  Physician Assistants and Nurse Practitioners) who all work together to provide you with the care you need, when you need it.  We recommend signing up for the patient portal called "MyChart".  Sign up information is provided on this After Visit Summary.  MyChart is used to connect with patients for Virtual Visits (Telemedicine).  Patients are able to view lab/test results, encounter notes, upcoming appointments, etc.  Non-urgent messages can be sent to your provider as well.   To learn more about what you can do with MyChart, go to [email or web address].    Your next appointment:   Follow up 2 Months with Dr.  Rockey Situ Follow up 1 year with Dr. Caryl Comes  The format for your next appointment:   In Person  Provider:   As Above   Other Instructions  Dapagliflozin Tablets What is this medication? DAPAGLIFLOZIN (DAP a gli FLOE zin) treats type 2 diabetes. It works by helping your kidneys remove sugar (glucose) from your blood through the urine, which decreases your blood sugar. It can also be used to lower the risk of heart attack, stroke, kidney disease, and hospitalization for heart failure in people with type 2 diabetes. Changes to diet and exercise are often combined with this medication. This medicine may be used for other purposes; ask your health care provider or pharmacist if you have questions. COMMON BRAND NAME(S): Wilder Glade What should I tell my care team before I take this medication? They need to know if you have any of these conditions: Dehydration Diabetic ketoacidosis Diet low in salt Eating less due to illness, surgery, dieting, or any other reason Having surgery History of pancreatitis or pancreas problems History of yeast infection of the penis or vagina If you often drink alcohol Infection in the bladder, kidneys, or urinary tract Kidney disease Low blood pressure On dialysis Problems urinating Type 1 diabetes Uncircumcised female An unusual or allergic reaction to dapagliflozin, other medications, foods, dyes, or preservatives Pregnant or trying to get pregnant Breast-feeding How should I use this medication? Take this medication by mouth with water. Take it as directed on the prescription label at the same time every day. You can take it with or without food. If it upsets your stomach,  take it with food. Keep taking it unless your care team tells you to stop. A special MedGuide will be given to you by the pharmacist with each prescription and refill. Be sure to read this information carefully each time. Talk to your care team about the use of this medication in children.  Special care may be needed. Overdosage: If you think you have taken too much of this medicine contact a poison control center or emergency room at once. NOTE: This medicine is only for you. Do not share this medicine with others. What if I miss a dose? If you miss a dose, take it as soon as you can. If it is almost time for your next dose, take only that dose. Do not take double or extra doses. What may interact with this medication? Interactions are not expected. This list may not describe all possible interactions. Give your health care provider a list of all the medicines, herbs, non-prescription drugs, or dietary supplements you use. Also tell them if you smoke, drink alcohol, or use illegal drugs. Some items may interact with your medicine. What should I watch for while using this medication? Visit your care team for regular checks on your progress. Tell your care team if your symptoms do not start to get better or if they get worse. This medication can cause a serious condition in which there is too much acid in the blood. If you develop nausea, vomiting, stomach pain, unusual tiredness, or breathing problems, stop taking this medication and call your care team right away. If possible, use a ketone dipstick to check for ketones in your urine. Check with your care team if you have severe diarrhea, nausea, and vomiting, or if you sweat a lot. The loss of too much body fluid may make it dangerous for you to take this medication. A test called the HbA1C (A1C) will be monitored. This is a simple blood test. It measures your blood sugar control over the last 2 to 3 months. You will receive this test every 3 to 6 months. Learn how to check your blood sugar. Learn the symptoms of low and high blood sugar and how to manage them. Always carry a quick-source of sugar with you in case you have symptoms of low blood sugar. Examples include hard sugar candy or glucose tablets. Make sure others know that you  can choke if you eat or drink when you develop serious symptoms of low blood sugar, such as seizures or unconsciousness. Get medical help at once. Tell your care team if you have high blood sugar. You might need to change the dose of your medication. If you are sick or exercising more than usual, you may need to change the dose of your medication. Do not skip meals. Ask your care team if you should avoid alcohol. Many nonprescription cough and cold products contain sugar or alcohol. These can affect blood sugar. Wear a medical ID bracelet or chain. Carry a card that describes your condition. List the medications and doses you take on the card. What side effects may I notice from receiving this medication? Side effects that you should report to your care team as soon as possible: Allergic reactions-skin rash, itching, hives, swelling of the face, lips, tongue, or throat Dehydration-increased thirst, dry mouth, feeling faint or lightheaded, headache, dark yellow or brown urine Diabetic ketoacidosis (DKA)-increased thirst or amount of urine, dry mouth, fatigue, fruity odor to breath, trouble breathing, stomach pain, nausea, vomiting Genital yeast infection-redness, swelling, pain,  or itchiness, odor, thick or lumpy discharge New pain or tenderness, change in skin color, sores or ulcers, infection of the leg or foot Infection or redness, swelling, tenderness, or pain in the genitals, or area from the genitals to the back of the rectum Urinary tract infection (UTI)-burning when passing urine, passing frequent small amounts of urine, bloody or cloudy urine, pain in the lower back or sides This list may not describe all possible side effects. Call your doctor for medical advice about side effects. You may report side effects to FDA at 1-800-FDA-1088. Where should I keep my medication? Keep out of the reach of children and pets. Store at room temperature between 20 and 25 degrees C (68 and 77 degrees F). Get  rid of any unused medication after the expiration date. To get rid of medications that are no longer needed or have expired: Take the medication to a medication take-back program. Check with your pharmacy or law enforcement to find a location. If you cannot return the medication, check the label or package insert to see if the medication should be thrown out in the garbage or flushed down the toilet. If you are not sure, ask your care team. If it is safe to put it in the trash, take the medication out of the container. Mix the medication with cat litter, dirt, coffee grounds, or other unwanted substance. Seal the mixture in a bag or container. Put it in the trash. NOTE: This sheet is a summary. It may not cover all possible information. If you have questions about this medicine, talk to your doctor, pharmacist, or health care provider.  2022 Elsevier/Gold Standard (2020-07-21 12:25:20)  Echocardiogram An echocardiogram is a test that uses sound waves (ultrasound) to produce images of the heart. Images from an echocardiogram can provide important information about: Heart size and shape. The size and thickness and movement of your heart's walls. Heart muscle function and strength. Heart valve function or if you have stenosis. Stenosis is when the heart valves are too narrow. If blood is flowing backward through the heart valves (regurgitation). A tumor or infectious growth around the heart valves. Areas of heart muscle that are not working well because of poor blood flow or injury from a heart attack. Aneurysm detection. An aneurysm is a weak or damaged part of an artery wall. The wall bulges out from the normal force of blood pumping through the body. Tell a health care provider about: Any allergies you have. All medicines you are taking, including vitamins, herbs, eye drops, creams, and over-the-counter medicines. Any blood disorders you have. Any surgeries you have had. Any medical  conditions you have. Whether you are pregnant or may be pregnant. What are the risks? Generally, this is a safe test. However, problems may occur, including an allergic reaction to dye (contrast) that may be used during the test. What happens before the test? No specific preparation is needed. You may eat and drink normally. What happens during the test?  You will take off your clothes from the waist up and put on a hospital gown. Electrodes or electrocardiogram (ECG)patches may be placed on your chest. The electrodes or patches are then connected to a device that monitors your heart rate and rhythm. You will lie down on a table for an ultrasound exam. A gel will be applied to your chest to help sound waves pass through your skin. A handheld device, called a transducer, will be pressed against your chest and moved over your heart.  The transducer produces sound waves that travel to your heart and bounce back (or "echo" back) to the transducer. These sound waves will be captured in real-time and changed into images of your heart that can be viewed on a video monitor. The images will be recorded on a computer and reviewed by your health care provider. You may be asked to change positions or hold your breath for a short time. This makes it easier to get different views or better views of your heart. In some cases, you may receive contrast through an IV in one of your veins. This can improve the quality of the pictures from your heart. The procedure may vary among health care providers and hospitals. What can I expect after the test? You may return to your normal, everyday life, including diet, activities, and medicines, unless your health care provider tells you not to do that. Follow these instructions at home: It is up to you to get the results of your test. Ask your health care provider, or the department that is doing the test, when your results will be ready. Keep all follow-up visits. This is  important. Summary An echocardiogram is a test that uses sound waves (ultrasound) to produce images of the heart. Images from an echocardiogram can provide important information about the size and shape of your heart, heart muscle function, heart valve function, and other possible heart problems. You do not need to do anything to prepare before this test. You may eat and drink normally. After the echocardiogram is completed, you may return to your normal, everyday life, unless your health care provider tells you not to do that. This information is not intended to replace advice given to you by your health care provider. Make sure you discuss any questions you have with your health care provider. Document Revised: 01/15/2020 Document Reviewed: 01/15/2020 Elsevier Patient Education  2022 [employer].

## 2021-02-10 NOTE — Progress Notes (Signed)
Patient Care Team: [name], MD as PCP - General (Family Medicine) Rockey Situ [name], MD as Consulting Physician (Cardiology)   HPI  Jacqueline Lozano is a 86 y.o. female seen in followup for pacemaker implantation for tachybradycardia syndrome. She has a history of permanent atrial fibrillation and status post CRT P. She is status post AV junction ablation.  LV lead has been permanently turned off because of diaphragmatic stimulation  Underwent generator replacement 7/20.   The patient denies chest pain, nocturnal dyspnea, orthopnea.  There have been no palpitations, lightheadedness or syncope.  Complains of dyspnea-chronic possibly worse, her daughter is not sure.  "Cannot breathe "very limited.  And very sedentary  Notes reviewed from Dr. Mortimer Fries regarding pulmonary evaluation, restrictive lung disease due to obesity as well as diastolic heart failure.  She also has a history of significant pneumonia as a young girl ; chest x-rays reports no acute disease the last chest x-ray was 2016 and I do not see a CT scan ever .  DATE TEST EF   2/16    Echo   EF 65 % Mild AS Grad 11 PA pressure 29  10/18    Myoview   EF 70 % No ischemia  2/19 Echo  65-70% Mild AS grad 6 PA pressure 58    Date Cr K Hgb  4/18 1.4  12.2   1/19 1.36  12.8  4/19 1.4  13.0  6/20 1.4  12.9  6/22 1.6 4.8 [id number]     Thromboembolic risk factors ( age  -2, HTN-1, Gender-1) for a CHADSVASc Score of >=4    Chronic watery stool.   Past Medical History:  Diagnosis Date   Abnormal echocardiogram 09/2009   EF >55%, mild LVH, moderate LAE, normal RV, normal PA systolic pressure   Allergic to IV contrast    Chronic diastolic CHF (congestive heart failure) (HCC)    Complete heart block-S./P. AV junction ablation 11/23/2010   Edentulous    Top.  Has most of bottom teeth   GERD (gastroesophageal reflux disease)    Hyperlipidemia    Has been unable to take statins due to muscle pain. Has tried multiple  statins per her report.   Hypertension    Hypothyroidism    Migraine    none recently   OSA (obstructive sleep apnea)    Not consistently using CPAP   Pacemaker-Medtronic CRT P. 11/23/2010   Atrial port was plugged [facility] 12/22/18)   PAD (peripheral artery disease) (HCC)    Occlusive disease involving left PT and AT   Permanent atrial fibrillation ([place])    Uses wheelchair    Able to stand and self transfer.    Past Surgical History:  Procedure Laterality Date   APPENDECTOMY     ATRIAL ABLATION SURGERY  06/12/2010   BIV PACEMAKER GENERATOR CHANGEOUT N/A 12/22/2018   Procedure: BIV PACEMAKER GENERATOR CHANGEOUT;  Surgeon: Deboraha Sprang, MD;  Location: [place] [facility];  Service: Cardiovascular;  Laterality: N/A;   CARDIAC CATHETERIZATION     Left heart cath x3 in teh past, last 5 yrs ago. All "normal" per pt report   CARDIOVERSION     CARPAL TUNNEL RELEASE     CATARACT EXTRACTION W/PHACO Right 10/29/2019   Procedure: CATARACT EXTRACTION PHACO AND INTRAOCULAR LENS PLACEMENT (IOC) RIGHT 3.87 00:41.2;  Surgeon: Eulogio Bear, MD;  Location: [place];  Service: Ophthalmology;  Laterality: Right;  sleep apnea   CATARACT EXTRACTION W/PHACO Left  12/03/2019   Procedure: CATARACT EXTRACTION PHACO AND INTRAOCULAR LENS PLACEMENT (IOC) LEFT 6.02  00:51.3;  Surgeon: Eulogio Bear, MD;  Location: [place];  Service: Ophthalmology;  Laterality: Left;  sleep apnea   CHOLECYSTECTOMY OPEN     COLONOSCOPY WITH PROPOFOL N/A 09/08/2015   Procedure: COLONOSCOPY WITH PROPOFOL;  Surgeon: Josefine Class, MD;  Location: [facility] END[facility]COPY;  Service: Endoscopy;  Laterality: N/A;   ESOPHAGOGASTRODUODENOSCOPY (EGD) WITH PROPOFOL N/A 09/08/2015   Procedure: ESOPHAGOGASTRODUODENOSCOPY (EGD) WITH PROPOFOL;  Surgeon: Josefine Class, MD;  Location: [facility] ENDOSCOPY;  Service: Endoscopy;  Laterality: N/A;   PACEMAKER INSERTION  06/12/2010   VESICOVAGINAL FISTULA CLOSURE W/ TAH       Current Outpatient Medications  Medication Sig Dispense Refill   acetaminophen (TYLENOL) 325 MG tablet Take 325 mg by mouth as needed for headache.     Alum Hydroxide-Mag Carbonate (GAVISCON PO) Take by mouth as needed.     apixaban (ELIQUIS) 2.5 MG TABS tablet Take 1 tablet by mouth twice daily 180 tablet 1   ASPIRIN 81 PO Take by mouth as needed. For headache     Aspirin-Caffeine (ANACIN PO) Take by mouth as needed (headache).     bismuth subsalicylate (PEPTO BISMOL) 262 MG chewable tablet Chew 262 mg by mouth as needed.     cyanocobalamin (,VITAMIN B-12,) 1000 MCG/ML injection Inject 1,000 mcg into the muscle every 30 (thirty) days.     dimenhyDRINATE (DRAMAMINE PO) Take by mouth as needed.     diphenoxylate-atropine (LOMOTIL) 2.5-0.025 MG tablet Take by mouth as needed for diarrhea or loose stools.     furosemide (LASIX) 20 MG tablet TAKE 1 TABLET BY MOUTH ONCE DAILY AS DIRECTED. TAKE A SECOND TABLET AFTER LUNCH IF YOU HAVE LEG SWELLING. 180 tablet 2   ibuprofen (ADVIL) 200 MG tablet Take 200 mg by mouth every 6 (six) hours as needed for headache or mild pain.     levothyroxine (SYNTHROID) 75 MCG tablet Take 75 mcg by mouth daily before breakfast.     metoprolol tartrate (LOPRESSOR) 50 MG tablet TAKE 1 & 1/2 (ONE & ONE-HALF) TABLETS BY MOUTH TWICE DAILY 270 tablet 2   nitroGLYCERIN (NITROSTAT) 0.4 MG SL tablet Place 1 tablet (0.4 mg total) under the tongue every 5 (five) minutes as needed for chest pain. 25 tablet 1   potassium chloride (KLOR-CON) 10 MEQ tablet Take 1 tablet by mouth once daily 90 tablet 3   prochlorperazine (COMPAZINE) 5 MG tablet Take 1 tablet (5 mg total) by mouth every 6 (six) hours as needed. 90 tablet 5   No current facility-administered medications for this visit.    Allergies  Allergen Reactions   Codeine Shortness Of Breath and Other (See Comments)   Iodinated Diagnostic Agents Shortness Of Breath and Other (See Comments)   Morphine Shortness Of Breath  and Other (See Comments)   Other Shortness Of Breath, Nausea And Vomiting and Other (See Comments)    Contrast dye   Oxycodone Shortness Of Breath   Shellfish-Derived Products Shortness Of Breath, Nausea And Vomiting and Rash   Dipyridamole Other (See Comments)    Unknown    Iodine Other (See Comments)   Omeprazole Other (See Comments)   Oxycodone-Acetaminophen Other (See Comments)   Statins Other (See Comments)    Unknown   Sulfonamide Derivatives Other (See Comments)    Unknown   Amlodipine Rash and Other (See Comments)    Review of Systems negative except from HPI and PMH  Physical Exam  BP 120/66 (BP Location: Left Arm, Patient Position: Sitting, Cuff Size: Large)   Pulse 81   Ht 5' (1.524 m)   Wt 186 lb (84.4 kg)   SpO2 98%   BMI 36.33 kg/m  Well developed and well nourished in no acute distress HENT normal Neck supple with JVP-10 Clear Device pocket well healed; without hematoma or erythema.  There is no tethering  Regular rate and rhythm, no  gallop No  murmur Abd-soft with active BS No Clubbing cyanosis  edema Skin-warm and dry A & Oriented  Grossly normal sensory and motor function  ECG sinus with P-synchronous/ AV  pacing    Assessment and  Plan  HFpEF  Atrial fibrillation-permanent  Dyspnea on exertion  Pulmonary hypertension  AV junction ablation-complete heart block  Pacemaker-CRT   LV lead inactivated secondary to diaphragmatic stimulation    Aortic stenosis-mild  Hypertension  Nonvaccinated    She continues to struggle with dyspnea.  This is been a chronic complaint.  Reviewing her chart I am not sure that there are clear targets of therapy; however, based on the recent trials of SGLT2, we will start her on Farxiga 10.  We will plan also to repeat her ultrasound to look at pulmonary pressures as there has been an increment 2016-19 as noted above.  Reviewed with her also Dr. Mortimer Fries' s recommendation about the importance of weight loss and  outlined an exercise program begin a walking 1-2 minutes 4 times a day with efforts to increase this gradually.  We will continue her on her furosemide 20 mg a day  No bleeding on her Eliquis.  Continue 2.5 mg twice daily       No report for for 5 months related to the question of

## 2021-02-11 ENCOUNTER — Ambulatory Visit (INDEPENDENT_AMBULATORY_CARE_PROVIDER_SITE_OTHER): Payer: Medicare Other

## 2021-02-11 DIAGNOSIS — Z950 Presence of cardiac pacemaker: Secondary | ICD-10-CM | POA: Diagnosis not present

## 2021-02-11 DIAGNOSIS — I5032 Chronic diastolic (congestive) heart failure: Secondary | ICD-10-CM | POA: Diagnosis not present

## 2021-02-13 NOTE — Progress Notes (Signed)
EPIC Encounter for ICM Monitoring  Patient Name: Jacqueline Lozano is a 86 y.o. female Date: 02/13/2021 Primary Care Physican: [name], MD Primary Cardiologist: Rockey Situ Electrophysiologist: Vergie Living Pacing:  88%       Last Weight:  unknown   Transmission reviewed.  Pt had OV with Dr Caryl Comes 02/10/2021   Optivol thoracic impedance suggesting possible fluid accumulation starting 02/04/2021 and trending back toward baseline.    Prescribed:  Furosemide 20 mg Take 1 tablet (20 mg total) by mouth as directed.  Take once daily with extra dose after lunch if you have leg swelling.   Potassium 10 mEq 1 tablet daily   Recommendations:  Any recommendations given at 02/10/2021 OV with Dr Caryl Comes.   Follow-up plan: ICM clinic phone appointment on 03/16/2021.  91 day device clinic remote transmission 03/26/2021.     EP/Cardiology Office Visits: 04/24/2021 with Dr Rockey Situ.  Recall 02/05/2022 with Dr. Caryl Comes.     Copy of ICM check sent to Dr. Caryl Comes.      3 month ICM trend: 02/11/2021.    1 Year ICM trend:       Rosalene Billings, RN 02/13/2021 1:47 PM

## 2021-02-26 ENCOUNTER — Other Ambulatory Visit: Payer: Self-pay | Admitting: Cardiovascular Disease

## 2021-03-10 ENCOUNTER — Ambulatory Visit (INDEPENDENT_AMBULATORY_CARE_PROVIDER_SITE_OTHER): Payer: Medicare Other

## 2021-03-10 ENCOUNTER — Other Ambulatory Visit: Payer: Self-pay

## 2021-03-10 DIAGNOSIS — R0602 Shortness of breath: Secondary | ICD-10-CM

## 2021-03-10 LAB — ECHOCARDIOGRAM COMPLETE
AR max vel: 1.6 cm2
AV Area VTI: 1.67 cm2
AV Area mean vel: 1.61 cm2
AV Mean grad: 6.8 mmHg
AV Peak grad: 11.5 mmHg
Ao pk vel: 1.7 m/s
Calc EF: 64 %
S' Lateral: 2.4 cm
Single Plane A2C EF: 69.3 %
Single Plane A4C EF: 50.2 %

## 2021-03-16 ENCOUNTER — Ambulatory Visit (INDEPENDENT_AMBULATORY_CARE_PROVIDER_SITE_OTHER): Payer: Medicare Other

## 2021-03-16 DIAGNOSIS — Z950 Presence of cardiac pacemaker: Secondary | ICD-10-CM

## 2021-03-16 DIAGNOSIS — I5032 Chronic diastolic (congestive) heart failure: Secondary | ICD-10-CM

## 2021-03-17 ENCOUNTER — Telehealth: Payer: Self-pay

## 2021-03-17 NOTE — Progress Notes (Signed)
EPIC Encounter for ICM Monitoring  Patient Name: Jacqueline Lozano is a 86 y.o. female Date: 03/17/2021 Primary Care Physican: [name], MD Primary Cardiologist: Rockey Situ Electrophysiologist: Vergie Living Pacing:  89.5%       Last Weight:  unknown   Attempted call to patient and unable to reach.  Left detailed message per DPR regarding transmission. Transmission reviewed.    Optivol thoracic impedance suggesting possible ongoing fluid accumulation starting 9/29 and trending back toward baseline.    Prescribed:  Furosemide 20 mg Take 1 tablet (20 mg total) by mouth as directed.  Take once daily with extra dose after lunch if you have leg swelling.   Potassium 10 mEq 1 tablet daily   Recommendations:  Left voice mail with ICM number and encouraged to call if experiencing any fluid symptoms.   Follow-up plan: ICM clinic phone appointment on 03/20/2021 to recheck fluid levels.  91 day device clinic remote transmission 03/26/2021.     EP/Cardiology Office Visits:  04/24/2021 with Dr Rockey Situ.  Recall 02/05/2022 with Dr. Caryl Comes.     Copy of ICM check sent to Dr. Caryl Comes.    Will send to Dr Rockey Situ for review if patient reached.   3 month ICM trend: 03/16/2021.    1 Year ICM trend:       Rosalene Billings, RN 03/17/2021 10:03 AM

## 2021-03-17 NOTE — Telephone Encounter (Signed)
Remote ICM transmission received.  Attempted call to patient regarding ICM remote transmission and left detailed message per DPR to return call.   

## 2021-03-20 ENCOUNTER — Ambulatory Visit (INDEPENDENT_AMBULATORY_CARE_PROVIDER_SITE_OTHER): Payer: Medicare Other

## 2021-03-20 DIAGNOSIS — I5032 Chronic diastolic (congestive) heart failure: Secondary | ICD-10-CM

## 2021-03-20 DIAGNOSIS — Z950 Presence of cardiac pacemaker: Secondary | ICD-10-CM

## 2021-03-20 NOTE — Progress Notes (Signed)
EPIC Encounter for ICM Monitoring  Patient Name: Jacqueline Lozano is a 86 y.o. female Date: 03/20/2021 Primary Care Physican: [name], MD Primary Cardiologist: Rockey Situ Electrophysiologist: Vergie Living Pacing:  91.1%       Last Weight:  unknown   Transmission reviewed.    Optivol thoracic impedance suggesting fluid levels returning to normal.    Prescribed:  Furosemide 20 mg Take 1 tablet (20 mg total) by mouth as directed.  Take once daily with extra dose after lunch if you have leg swelling.   Potassium 10 mEq 1 tablet daily   Recommendations:  No changes.   Follow-up plan: ICM clinic phone appointment on 04/20/2021.  91 day device clinic remote transmission 03/26/2021.     EP/Cardiology Office Visits:  04/24/2021 with Dr Rockey Situ.  Recall 02/05/2022 with Dr. Caryl Comes.     Copy of ICM check sent to Dr. Caryl Comes.    3 month ICM trend: 03/20/2021.    1 Year ICM trend:       Rosalene Billings, RN 03/20/2021 3:59 PM

## 2021-03-26 ENCOUNTER — Telehealth: Payer: Self-pay | Admitting: Internal Medicine

## 2021-03-26 ENCOUNTER — Ambulatory Visit (INDEPENDENT_AMBULATORY_CARE_PROVIDER_SITE_OTHER): Payer: Medicare Other

## 2021-03-26 DIAGNOSIS — I442 Atrioventricular block, complete: Secondary | ICD-10-CM | POA: Diagnosis not present

## 2021-03-26 LAB — CUP PACEART REMOTE DEVICE CHECK
Battery Remaining Longevity: 108 mo
Battery Voltage: 3 V
Brady Statistic AP VP Percent: 0 %
Brady Statistic AP VS Percent: 0 %
Brady Statistic AS VP Percent: 93.81 %
Brady Statistic AS VS Percent: 6.19 %
Brady Statistic RA Percent Paced: 0 %
Brady Statistic RV Percent Paced: 93.81 %
Date Time Interrogation Session: 20221019221800
Implantable Lead Implant Date: 20120106
Implantable Lead Implant Date: 20120106
Implantable Lead Location: 753858
Implantable Lead Location: 753860
Implantable Lead Model: 5076
Implantable Pulse Generator Implant Date: 20200717
Lead Channel Impedance Value: 1292 Ohm
Lead Channel Impedance Value: 3306 Ohm
Lead Channel Impedance Value: 3306 Ohm
Lead Channel Impedance Value: 380 Ohm
Lead Channel Impedance Value: 437 Ohm
Lead Channel Impedance Value: 608 Ohm
Lead Channel Impedance Value: 684 Ohm
Lead Channel Impedance Value: 779 Ohm
Lead Channel Impedance Value: 893 Ohm
Lead Channel Pacing Threshold Amplitude: 0.625 V
Lead Channel Pacing Threshold Pulse Width: 0.4 ms
Lead Channel Setting Pacing Amplitude: 2.5 V
Lead Channel Setting Pacing Pulse Width: 0.4 ms
Lead Channel Setting Sensing Sensitivity: 2.8 mV

## 2021-03-26 NOTE — Telephone Encounter (Signed)
Jacqueline Sprang, MD  03/25/2021 12:50 PM EDT     Please Inform Patient Echo showed  normal heart muscle function and pulmonary pressures are normal  this is good news but doesn't help [place] with your shortness of breath     If she has never seen pulm we should offer her that Thanks SK

## 2021-03-26 NOTE — Telephone Encounter (Signed)
I spoke with the patient regarding her echo results.  I have advised her of Dr. Olin Lozano recommendations to be seen by Pulmonary if she has not already established with one. The patient could not recall at first, then she advised that she had seen someone in the past, but couldn't remember who.  After further review of her chart, the patient had seen Dr. Raul Lozano in 2019.  The patient advised she did not want to schedule to see Pulmonary at this time as her last experience was "ok" but "the doctor didn't say much." She is also worried about her daughter taking time off work for her appointments.  The patient has a follow up appointment scheduled with Dr. Rockey Lozano on 04/24/21 and will discuss a pulmonary follow up with him further at that time.  Will send to Dr. Rockey Lozano as an Juluis Rainier.  As well, I confirmed with the patient that she was not able to tolerate Farixga as prescribed by Dr. Caryl Lozano at her last appointment. She stated she had "seizure like activity" after 1 dose of this medication.  Will add to the patient's medication allergy list.

## 2021-04-03 NOTE — Progress Notes (Signed)
Remote pacemaker transmission.   

## 2021-04-20 ENCOUNTER — Ambulatory Visit (INDEPENDENT_AMBULATORY_CARE_PROVIDER_SITE_OTHER): Payer: Medicare Other

## 2021-04-20 DIAGNOSIS — I5032 Chronic diastolic (congestive) heart failure: Secondary | ICD-10-CM | POA: Diagnosis not present

## 2021-04-20 DIAGNOSIS — Z950 Presence of cardiac pacemaker: Secondary | ICD-10-CM | POA: Diagnosis not present

## 2021-04-22 NOTE — Progress Notes (Signed)
EPIC Encounter for ICM Monitoring  Patient Name: JAJAIRA RUIS is a 86 y.o. female Date: 04/22/2021 Primary Care Physican: [name], MD Primary Cardiologist: Rockey Situ Electrophysiologist: Vergie Living Pacing:  96.2%       Last Weight:  unknown   Spoke with patient and heart failure questions reviewed.  Pt asymptomatic for fluid accumulation and feeling well.   Optivol thoracic impedance suggesting normal fluid levels.    Prescribed:  Furosemide 20 mg Take 1 tablet (20 mg total) by mouth as directed.  Take once daily with extra dose after lunch if you have leg swelling.   Potassium 10 mEq 1 tablet daily   Recommendations:  No changes.   Follow-up plan: ICM clinic phone appointment on 05/25/2021.  91 day device clinic remote transmission 06/25/2021.     EP/Cardiology Office Visits:  04/24/2021 with Dr Rockey Situ.  Recall 02/05/2022 with Dr. Caryl Comes.     Copy of ICM check sent to Dr. Caryl Comes.    3 month ICM trend: 04/20/2021.    12-14 Month ICM trend:       Rosalene Billings, RN 04/22/2021 3:50 PM

## 2021-04-24 ENCOUNTER — Other Ambulatory Visit: Payer: Self-pay

## 2021-04-24 ENCOUNTER — Encounter: Payer: Self-pay | Admitting: Cardiovascular Disease

## 2021-04-24 ENCOUNTER — Ambulatory Visit (INDEPENDENT_AMBULATORY_CARE_PROVIDER_SITE_OTHER): Payer: Medicare Other | Admitting: Cardiovascular Disease

## 2021-04-24 VITALS — BP 136/78 | HR 75 | Ht 60.0 in | Wt 185.0 lb

## 2021-04-24 DIAGNOSIS — R0602 Shortness of breath: Secondary | ICD-10-CM

## 2021-04-24 DIAGNOSIS — I4821 Permanent atrial fibrillation: Secondary | ICD-10-CM

## 2021-04-24 DIAGNOSIS — Z950 Presence of cardiac pacemaker: Secondary | ICD-10-CM

## 2021-04-24 DIAGNOSIS — I5032 Chronic diastolic (congestive) heart failure: Secondary | ICD-10-CM

## 2021-04-24 DIAGNOSIS — I442 Atrioventricular block, complete: Secondary | ICD-10-CM | POA: Diagnosis not present

## 2021-04-24 MED ORDER — FUROSEMIDE 20 MG PO TABS: ORAL_TABLET | ORAL | 3 refills | Status: DC

## 2021-04-24 NOTE — Progress Notes (Signed)
Date:  04/24/2021   ID:  Jacqueline Lozano, DOB 1934/08/25, MRN [id number]  Patient Location:  [place] [address]   Provider location:   [facility], [place] office  PCP:  [name], MD  Cardiologist:  Arvid Right [facility]  Chief Complaint  Patient presents with   2 month follow up     Patient c/o shortness of breath & chest pain that comes on with and without activity. Medications reviewed by the patient verbally.     History of Present Illness:    Jacqueline Lozano Jacqueline Lozano is a 86 y.o. female past medical history of morbid obesity, recent weight loss Chronic bilateral knee pain who has declined joint surgery Permanent atrial fibrillation,  status post arteriovenous node ablation, Pacer/CRT-P,  ventricularly paced Hypertension,  Hypothyroidism,  Diastolic congestive heart failure, chronic shortness of breath C. difficile infection, chronic diarrhea Echo 07/2014: mild AS Carotid: plaque on the right in 2014 Prior PFT's shows restrictive lung disease likely from her obseity Chronic shortness of breath multifactorial including AV node ablation, paced rhythm, old obesity, deconditioning, restrictive lung disease, aortic valve stenosis who presents for followup of her chronic shortness of breath, pacemaker, atrial fibrillation   LOV with myself April 2022  In follow-up today she presents with her daughter Coral Ceo, "felt sick"  Echo Normal EF 60%, normal RVSP   chronic shortness of breath extensive work-up, seen pulmonary Has had cardiac work-up, unrevealing, normal ejection fraction on echo  gait instability No exercise, knee pain Family feels she is deconditioned  Still missing her teeth,  weight stable to slowly trending downward  Statin intolerance  EKG personally reviewed by myself on todays visit Shows paced rhythm rate 74 beats per minute   Unable to participate in pulmonary rehabilitation given severe chronic  knee pain  Stress test 03/2017: normal study  Previous echocardiogram 2016 showed mild to moderate aortic valve stenosis, normal EF, normal right heart pressures   echocardiogram shows normal LV function, stable valve disease, normal right ventricular systolic pressures.   Previous visit to the emergency room for atypical chest pain. Cardiac enzymes were negative and she was discharged home Prior workup with echocardiogram in 2014 and stress test were unrevealing.    sleep study in October and again in November. Sleep study in October suggested she repeat the test as it was incomplete. The note indicates the study was discontinued as the patient had heart block, with limited EKG monitoring appearing to demonstrate a 43 second period of complete heart block with ventricular standstill. Atrial rate was 80 beats per minute. Note indicates the patient was awake and alert during the event with some dizziness. Pacer was checked after this event,  Normal functioning device.   Followup sleep study 04/24/2013 recommended weight loss, though there was no indications for CPAP at this time. Previous stress test  early 2014 showed no ischemia    Past Medical History:  Diagnosis Date   Abnormal echocardiogram 09/2009   EF >55%, mild LVH, moderate LAE, normal RV, normal PA systolic pressure   Allergic to IV contrast    Chronic diastolic CHF (congestive heart failure) (HCC)    Complete heart block-S./P. AV junction ablation 11/23/2010   Edentulous    Top.  Has most of bottom teeth   GERD (gastroesophageal reflux disease)    Hyperlipidemia    Has been unable to take statins due to muscle pain. Has tried multiple statins per her report.   Hypertension  Hypothyroidism    Migraine    none recently   OSA (obstructive sleep apnea)    Not consistently using CPAP   Pacemaker-Medtronic CRT P. 11/23/2010   Atrial port was plugged [facility] 12/22/18)   PAD (peripheral artery disease) (HCC)     Occlusive disease involving left PT and AT   Permanent atrial fibrillation ([place])    Uses wheelchair    Able to stand and self transfer.   Past Surgical History:  Procedure Laterality Date   APPENDECTOMY     ATRIAL ABLATION SURGERY  06/12/2010   BIV PACEMAKER GENERATOR CHANGEOUT N/A 12/22/2018   Procedure: BIV PACEMAKER GENERATOR CHANGEOUT;  Surgeon: Deboraha Sprang, MD;  Location: [place] CV [facility];  Service: Cardiovascular;  Laterality: N/A;   CARDIAC CATHETERIZATION     Left heart cath x3 in teh past, last 5 yrs ago. All "normal" per pt report   CARDIOVERSION     CARPAL TUNNEL RELEASE     CATARACT EXTRACTION W/PHACO Right 10/29/2019   Procedure: CATARACT EXTRACTION PHACO AND INTRAOCULAR LENS PLACEMENT (IOC) RIGHT 3.87 00:41.2;  Surgeon: Eulogio Bear, MD;  Location: [place];  Service: Ophthalmology;  Laterality: Right;  sleep apnea   CATARACT EXTRACTION W/PHACO Left 12/03/2019   Procedure: CATARACT EXTRACTION PHACO AND INTRAOCULAR LENS PLACEMENT (IOC) LEFT 6.02  00:51.3;  Surgeon: Eulogio Bear, MD;  Location: [place];  Service: Ophthalmology;  Laterality: Left;  sleep apnea   CHOLECYSTECTOMY OPEN     COLONOSCOPY WITH PROPOFOL N/A 09/08/2015   Procedure: COLONOSCOPY WITH PROPOFOL;  Surgeon: Josefine Class, MD;  Location: [facility] ENDOSCOPY;  Service: Endoscopy;  Laterality: N/A;   ESOPHAGOGASTRODUODENOSCOPY (EGD) WITH PROPOFOL N/A 09/08/2015   Procedure: ESOPHAGOGASTRODUODENOSCOPY (EGD) WITH PROPOFOL;  Surgeon: Josefine Class, MD;  Location: [facility] ENDOSCOPY;  Service: Endoscopy;  Laterality: N/A;   PACEMAKER INSERTION  06/12/2010   VESICOVAGINAL FISTULA CLOSURE W/ TAH       Current Meds  Medication Sig   acetaminophen (TYLENOL) 325 MG tablet Take 325 mg by mouth as needed for headache.   Alum Hydroxide-Mag Carbonate (GAVISCON PO) Take by mouth as needed.   apixaban (ELIQUIS) 2.5 MG TABS tablet Take 1 tablet by mouth twice daily   ASPIRIN 81 PO Take  by mouth as needed. For headache   Aspirin-Caffeine (ANACIN PO) Take by mouth as needed (headache).   bismuth subsalicylate (PEPTO BISMOL) 262 MG chewable tablet Chew 262 mg by mouth as needed.   cyanocobalamin (,VITAMIN B-12,) 1000 MCG/ML injection Inject 1,000 mcg into the muscle every 30 (thirty) days.   dimenhyDRINATE (DRAMAMINE PO) Take by mouth as needed.   diphenoxylate-atropine (LOMOTIL) 2.5-0.025 MG tablet Take by mouth as needed for diarrhea or loose stools.   furosemide (LASIX) 20 MG tablet TAKE 1 TABLET BY MOUTH ONCE DAILY AS DIRECTED. TAKE A SECOND TABLET AFTER LUNCH IF YOU HAVE LEG SWELLING.   ibuprofen (ADVIL) 200 MG tablet Take 200 mg by mouth every 6 (six) hours as needed for headache or mild pain.   levothyroxine (SYNTHROID) 75 MCG tablet Take 75 mcg by mouth daily before breakfast.   metoprolol tartrate (LOPRESSOR) 50 MG tablet TAKE 1 & 1/2 (ONE & ONE-HALF) TABLETS BY MOUTH TWICE DAILY   nitroGLYCERIN (NITROSTAT) 0.4 MG SL tablet Place 1 tablet (0.4 mg total) under the tongue every 5 (five) minutes as needed for chest pain.   potassium chloride (KLOR-CON) 10 MEQ tablet Take 1 tablet by mouth once daily   prochlorperazine (COMPAZINE) 5 MG tablet  Take 1 tablet (5 mg total) by mouth every 6 (six) hours as needed.     Allergies:   Codeine, Farxiga [dapagliflozin], Iodinated diagnostic agents, Morphine, Other, Oxycodone, Shellfish-derived products, Dipyridamole, Iodine, Omeprazole, Oxycodone-acetaminophen, Statins, Sulfa antibiotics, Sulfonamide derivatives, and Amlodipine   Social History   Tobacco Use   Smoking status: Never   Smokeless tobacco: Never   Tobacco comments:    pos smoke exposure through father and spouse who smoked in the home with her for many years  Vaping Use   Vaping Use: Never used  Substance Use Topics   Alcohol use: No   Drug use: No     Family Hx: The patient's family history includes Colon cancer in her daughter; Heart attack in her father and  mother. There is no history of Stroke.  ROS:   Please see the history of present illness.    Review of Systems  Constitutional: Negative.   Respiratory:  Positive for shortness of breath.   Cardiovascular: Negative.   Gastrointestinal: Negative.   Musculoskeletal: Negative.   Neurological: Negative.   Psychiatric/Behavioral: Negative.    All other systems reviewed and are negative.   Labs/Other Tests and Data Reviewed:    Recent Labs: No results found for requested labs within last 8760 hours.   Recent Lipid Panel Lab Results  Component Value Date/Time   CHOL 294 (H) 09/30/2009 09:19 PM   TRIG 459 (H) 09/30/2009 09:19 PM   HDL 44 09/30/2009 09:19 PM   CHOLHDL 6.7 Ratio 09/30/2009 09:19 PM   LDLCALC See Comment mg/dL 09/30/2009 09:19 PM   LDLDIRECT 146 (H) 10/01/2009 08:51 AM    Wt Readings from Last 3 Encounters:  04/24/21 185 lb (83.9 kg)  02/10/21 186 lb (84.4 kg)  09/29/20 188 lb (85.3 kg)     Exam:     BP 136/78 (BP Location: Left Arm, Patient Position: Sitting, Cuff Size: Normal)   Pulse 75   Ht 5' (1.524 m)   Wt 185 lb (83.9 kg)   SpO2 98%   BMI 36.13 kg/m   Constitutional:  oriented to person, place, and time. No distress.  HENT:  Head: Grossly normal Eyes:  no discharge. No scleral icterus.  Neck: No JVD, no carotid bruits  Cardiovascular: Regular rate and rhythm, no murmurs appreciated Pulmonary/Chest: Clear to auscultation bilaterally, no wheezes or rails Abdominal: Soft.  no distension.  no tenderness.  Musculoskeletal: Normal range of motion Neurological:  normal muscle tone. Coordination normal. No atrophy Skin: Skin warm and dry Psychiatric: normal affect, pleasant   ASSESSMENT & PLAN:    Atrial fibrillation, unspecified type ([place]) - Plan: EKG 12-Lead On anticoagulation, Prior AV node ablation, paced Likely contributing to chronic SOB  Shortness of breath Chronic longstanding issue Echocardiogram and stress test , no ischemia,  normal  EF Has seen pulmonary, no further work-up at this time Recommended continued weight loss, Very deconditioned, unable to exercise   Pacemaker-Medtronic CRT P. Managed by Dr. Caryl Comes  Chronic diastolic CHF (congestive heart failure) (HCC)  euvolemic, no change in medication   Complete heart block-S./P. AV junction ablation pacemaker , ventricularly paced Recent pacer battery change  Hyperlipidemia Previously declined statin Weight loss, 3 more pounds Previously recommended statins and Zetia   Total encounter time more than 25 minutes  Greater than 50% was spent in counseling and coordination of care with the patient   Signed, Ida Rogue, MD  04/24/2021 2:24 PM     [place] [facility] [place]  [address], [place], [place] [address]

## 2021-04-24 NOTE — Patient Instructions (Signed)
Medication Instructions:  No medication changes  *If you need a refill on your cardiac medications before your next appointment, please call your pharmacy*   Lab Work: None  Testing/Procedures: None  Follow-Up: At [facility], you and your health needs are our priority.  As part of our continuing mission to provide you with exceptional heart care, we have created designated Provider Care Teams.  These Care Teams include your primary Cardiologist (physician) and Advanced Practice Providers (APPs -  Physician Assistants and Nurse Practitioners) who all work together to provide you with the care you need, when you need it.  We recommend signing up for the patient portal called "MyChart".  Sign up information is provided on this After Visit Summary.  MyChart is used to connect with patients for Virtual Visits (Telemedicine).  Patients are able to view lab/test results, encounter notes, upcoming appointments, etc.  Non-urgent messages can be sent to your provider as well.   To learn more about what you can do with MyChart, go to [email or web address].    Your next appointment:   12 month(s)  The format for your next appointment:   In Person  Provider:   You may see Dr. Rockey Situ or one of the following Advanced Practice Providers on your designated Care Team:   Murray Hodgkins, NP Christell Faith, PA-C Cadence Kathlen Mody, [place]

## 2021-05-24 ENCOUNTER — Other Ambulatory Visit: Payer: Self-pay | Admitting: Internal Medicine

## 2021-05-24 ENCOUNTER — Other Ambulatory Visit: Payer: Self-pay | Admitting: Cardiovascular Disease

## 2021-05-24 DIAGNOSIS — I4821 Permanent atrial fibrillation: Secondary | ICD-10-CM

## 2021-05-25 ENCOUNTER — Ambulatory Visit (INDEPENDENT_AMBULATORY_CARE_PROVIDER_SITE_OTHER): Payer: Medicare Other

## 2021-05-25 DIAGNOSIS — Z950 Presence of cardiac pacemaker: Secondary | ICD-10-CM | POA: Diagnosis not present

## 2021-05-25 DIAGNOSIS — I5032 Chronic diastolic (congestive) heart failure: Secondary | ICD-10-CM | POA: Diagnosis not present

## 2021-05-25 NOTE — Telephone Encounter (Signed)
Eliquis 2.5 mg refill request received. Patient is 86 years old, weight-83.9 kg, Crea- 1.6 on 11/13/20, Diagnosis-PAF, and last seen by Dr. Rockey Situ on 04/24/21. Dose is appropriate based on dosing criteria. Will send in refill to requested pharmacy.

## 2021-05-27 NOTE — Progress Notes (Signed)
EPIC Encounter for ICM Monitoring  Patient Name: Jacqueline Lozano is a 86 y.o. female Date: 05/27/2021 Primary Care Physican: [name], MD Primary Cardiologist: Rockey Situ Electrophysiologist: Vergie Living Pacing:  98.4%       Last Weight:  unknown   Spoke with patient and heart failure questions reviewed.  Pt asymptomatic for fluid accumulation and feeling well.   Optivol thoracic impedance suggesting normal fluid levels.    Prescribed:  Furosemide 20 mg Take 1 tablet (20 mg total) by mouth as directed.  Take once daily with extra dose after lunch if you have leg swelling.   Potassium 10 mEq 1 tablet daily   Recommendations:  No changes and encouraged to call if experiencing any fluid symptoms.   Follow-up plan: ICM clinic phone appointment on 06/30/2021.  91 day device clinic remote transmission 06/25/2021.     EP/Cardiology Office Visits:  Recall 04/24/2022 with Dr Rockey Situ.  Recall 02/05/2022 with Dr. Caryl Comes.     Copy of ICM check sent to Dr. Caryl Comes.    3 month ICM trend: 05/25/2021.    12-14 Month ICM trend:       Rosalene Billings, RN 05/27/2021 9:04 AM

## 2021-06-25 ENCOUNTER — Ambulatory Visit (INDEPENDENT_AMBULATORY_CARE_PROVIDER_SITE_OTHER): Payer: Medicare Other

## 2021-06-25 DIAGNOSIS — I5032 Chronic diastolic (congestive) heart failure: Secondary | ICD-10-CM | POA: Diagnosis not present

## 2021-06-25 LAB — CUP PACEART REMOTE DEVICE CHECK
Battery Remaining Longevity: 105 mo
Battery Voltage: 3 V
Brady Statistic AP VP Percent: 0 %
Brady Statistic AP VS Percent: 0 %
Brady Statistic AS VP Percent: 99.65 %
Brady Statistic AS VS Percent: 0.35 %
Brady Statistic RA Percent Paced: 0 %
Brady Statistic RV Percent Paced: 99.65 %
Date Time Interrogation Session: 20230118233135
Implantable Lead Implant Date: 20120106
Implantable Lead Implant Date: 20120106
Implantable Lead Location: 753858
Implantable Lead Location: 753860
Implantable Lead Model: 5076
Implantable Pulse Generator Implant Date: 20200717
Lead Channel Impedance Value: 1197 Ohm
Lead Channel Impedance Value: 3306 Ohm
Lead Channel Impedance Value: 3306 Ohm
Lead Channel Impedance Value: 380 Ohm
Lead Channel Impedance Value: 437 Ohm
Lead Channel Impedance Value: 646 Ohm
Lead Channel Impedance Value: 684 Ohm
Lead Channel Impedance Value: 760 Ohm
Lead Channel Impedance Value: 817 Ohm
Lead Channel Pacing Threshold Amplitude: 0.625 V
Lead Channel Pacing Threshold Pulse Width: 0.4 ms
Lead Channel Setting Pacing Amplitude: 2.5 V
Lead Channel Setting Pacing Pulse Width: 0.4 ms
Lead Channel Setting Sensing Sensitivity: 2.8 mV

## 2021-06-30 ENCOUNTER — Ambulatory Visit (INDEPENDENT_AMBULATORY_CARE_PROVIDER_SITE_OTHER): Payer: Medicare Other

## 2021-06-30 DIAGNOSIS — Z950 Presence of cardiac pacemaker: Secondary | ICD-10-CM | POA: Diagnosis not present

## 2021-06-30 DIAGNOSIS — I5032 Chronic diastolic (congestive) heart failure: Secondary | ICD-10-CM

## 2021-07-01 ENCOUNTER — Telehealth: Payer: Self-pay

## 2021-07-01 NOTE — Progress Notes (Signed)
EPIC Encounter for ICM Monitoring  Patient Name: Jacqueline Lozano is a 87 y.o. female Date: 07/01/2021 Primary Care Physican: [name], MD Primary Cardiologist: Rockey Situ Electrophysiologist: Vergie Living Pacing:  96.9%       Last Weight:  unknown   Attempted call to patient and unable to reach.  Left detailed message per DPR regarding transmission. Transmission reviewed.    Optivol thoracic impedance suggesting normal fluid levels.    Prescribed:  Furosemide 20 mg Take 1 tablet (20 mg total) by mouth as directed.  Take once daily with extra dose after lunch if you have leg swelling.   Potassium 10 mEq 1 tablet daily   Recommendations:  Left voice mail with ICM number and encouraged to call if experiencing any fluid symptoms.   Follow-up plan: ICM clinic phone appointment on 08/03/2021.  91 day device clinic remote transmission 09/24/2021.     EP/Cardiology Office Visits:  Recall 04/24/2022 with Dr Rockey Situ.  Recall 02/05/2022 with Dr. Caryl Comes.     Copy of ICM check sent to Dr. Caryl Comes.    3 month ICM trend: 06/30/2021.    12-14 Month ICM trend:     Rosalene Billings, RN 07/01/2021 3:47 PM

## 2021-07-01 NOTE — Telephone Encounter (Signed)
Remote ICM transmission received.  Attempted call to patient regarding ICM remote transmission and left detailed message per DPR.  Advised to return call for any fluid symptoms or questions. Next ICM remote transmission scheduled 08/03/2021.

## 2021-07-08 NOTE — Progress Notes (Signed)
Remote pacemaker transmission.   

## 2021-08-03 ENCOUNTER — Ambulatory Visit (INDEPENDENT_AMBULATORY_CARE_PROVIDER_SITE_OTHER): Payer: Medicare Other

## 2021-08-03 DIAGNOSIS — I5032 Chronic diastolic (congestive) heart failure: Secondary | ICD-10-CM

## 2021-08-03 DIAGNOSIS — Z950 Presence of cardiac pacemaker: Secondary | ICD-10-CM | POA: Diagnosis not present

## 2021-08-04 NOTE — Progress Notes (Signed)
EPIC Encounter for ICM Monitoring  Patient Name: Jacqueline Lozano is a 87 y.o. female Date: 08/04/2021 Primary Care Physican: [name], MD Primary Cardiologist: Rockey Situ Electrophysiologist: Vergie Living Pacing:  99.6%       Last Weight:  unknown   Spoke with patient and heart failure questions reviewed.  Pt asymptomatic for fluid accumulation.  Pt is eating take out foods for the past few days.   Optivol thoracic impedance suggesting normal fluid levels.    Prescribed:  Furosemide 20 mg Take 1 tablet (20 mg total) by mouth as directed.  Take once daily with extra dose after lunch if you have leg swelling.   Potassium 10 mEq 1 tablet daily   Recommendations:  Advised to avoid restaurant foods since typically high in salt.  Advised to take 1 extra Furosemide tablet this afternoon and tomorrow afternoon then return to to prescribed dosage.    Follow-up plan: ICM clinic phone appointment on 08/07/2021 to recheck fluid levels.  91 day device clinic remote transmission 09/24/2021.     EP/Cardiology Office Visits:  Recall 04/24/2022 with Dr Rockey Situ.  Recall 02/05/2022 with Dr. Caryl Comes.     Copy of ICM check sent to Dr. Caryl Comes.    3 month ICM trend: 08/03/2021.    12-14 Month ICM trend:     Rosalene Billings, RN 08/04/2021 1:07 PM

## 2021-08-10 ENCOUNTER — Ambulatory Visit (INDEPENDENT_AMBULATORY_CARE_PROVIDER_SITE_OTHER): Payer: Medicare Other

## 2021-08-10 DIAGNOSIS — Z950 Presence of cardiac pacemaker: Secondary | ICD-10-CM

## 2021-08-10 DIAGNOSIS — I5032 Chronic diastolic (congestive) heart failure: Secondary | ICD-10-CM

## 2021-08-12 NOTE — Progress Notes (Signed)
EPIC Encounter for ICM Monitoring ? ?Patient Name: Jacqueline Lozano is a 87 y.o. female ?Date: 08/12/2021 ?Primary Care Physican: [name], MD ?Primary Cardiologist: Rockey Situ ?Electrophysiologist: Caryl Comes ?Bi-V Pacing:  99.5%       ?Last Weight:  unknown ?  ?Spoke with patient and heart failure questions reviewed.  Pt asymptomatic for fluid accumulation.  Pt continues to eat foods high in salt. ?  ?Optivol thoracic impedance suggesting fluid levels returned close to normal after taking extra Furosemide. ?  ?Prescribed:  ?Furosemide 20 mg Take 1 tablet (20 mg total) by mouth as directed.  Take once daily with extra dose after lunch if you have leg swelling.   ?Potassium 10 mEq 1 tablet daily ?  ?Recommendations:  Advised avoid foods high in salt.  No changes and encouraged to call if experiencing any fluid symptoms.   ?  ?Follow-up plan: ICM clinic phone appointment on 09/07/2021.  91 day device clinic remote transmission 09/24/2021.   ?  ?EP/Cardiology Office Visits:  Recall 04/24/2022 with Dr Rockey Situ.  Recall 02/05/2022 with Dr. Caryl Comes.   ?  ?Copy of ICM check sent to Dr. Caryl Comes.   ? ?3 month ICM trend: 08/10/2021. ? ? ? ?12-14 Month ICM trend:  ? ? ? ?Rosalene Billings, RN ?08/12/2021 ?1:10 PM ? ?

## 2021-09-07 ENCOUNTER — Ambulatory Visit (INDEPENDENT_AMBULATORY_CARE_PROVIDER_SITE_OTHER): Payer: Medicare Other

## 2021-09-07 DIAGNOSIS — I5032 Chronic diastolic (congestive) heart failure: Secondary | ICD-10-CM | POA: Diagnosis not present

## 2021-09-07 DIAGNOSIS — Z950 Presence of cardiac pacemaker: Secondary | ICD-10-CM

## 2021-09-09 NOTE — Progress Notes (Signed)
EPIC Encounter for ICM Monitoring ? ?Patient Name: Jacqueline Lozano is a 87 y.o. female ?Date: 09/09/2021 ?Primary Care Physican: [name], MD ?Primary Cardiologist: Rockey Situ ?Electrophysiologist: Caryl Comes ?Bi-V Pacing:  98.6%       ?Last Weight:  unknown ?  ?Spoke with patient and heart failure questions reviewed.  Pt asymptomatic for fluid accumulation.  ?  ?Optivol thoracic impedance suggesting intermittent days with possible fluid accumulation for the past month. ?  ?Prescribed:  ?Furosemide 20 mg Take 1 tablet (20 mg total) by mouth as directed.  Take once daily with extra dose after lunch if you have leg swelling.   ?Potassium 10 mEq 1 tablet daily ?  ?Recommendations:  No changes and encouraged to call if experiencing any fluid symptoms.   ?  ?Follow-up plan: ICM clinic phone appointment on 10/12/2021.  91 day device clinic remote transmission 09/24/2021.   ?  ?EP/Cardiology Office Visits:  Recall 04/24/2022 with Dr Rockey Situ.  Recall 02/05/2022 with Dr. Caryl Comes.   ?  ?Copy of ICM check sent to Dr. Caryl Comes.   ? ?3 month ICM trend: 09/07/2021. ? ? ? ?12-14 Month ICM trend:  ? ? ? ?Rosalene Billings, RN ?09/09/2021 ?3:40 PM ? ?

## 2021-09-16 ENCOUNTER — Emergency Department
Admission: EM | Admit: 2021-09-16 | Discharge: 2021-09-17 | Disposition: A | Discharge status: Home / Self Care | Payer: Medicare Other | Attending: Emergency Medicine | Admitting: Emergency Medicine

## 2021-09-16 ENCOUNTER — Emergency Department: Payer: Medicare Other

## 2021-09-16 DIAGNOSIS — I110 Hypertensive heart disease with heart failure: Secondary | ICD-10-CM | POA: Insufficient documentation

## 2021-09-16 DIAGNOSIS — K859 Acute pancreatitis without necrosis or infection, unspecified: Secondary | ICD-10-CM | POA: Diagnosis not present

## 2021-09-16 DIAGNOSIS — E039 Hypothyroidism, unspecified: Secondary | ICD-10-CM | POA: Diagnosis not present

## 2021-09-16 DIAGNOSIS — I5032 Chronic diastolic (congestive) heart failure: Secondary | ICD-10-CM | POA: Insufficient documentation

## 2021-09-16 DIAGNOSIS — R944 Abnormal results of kidney function studies: Secondary | ICD-10-CM | POA: Diagnosis not present

## 2021-09-16 DIAGNOSIS — I4821 Permanent atrial fibrillation: Secondary | ICD-10-CM | POA: Insufficient documentation

## 2021-09-16 DIAGNOSIS — Z79899 Other long term (current) drug therapy: Secondary | ICD-10-CM | POA: Diagnosis not present

## 2021-09-16 DIAGNOSIS — Z7982 Long term (current) use of aspirin: Secondary | ICD-10-CM | POA: Insufficient documentation

## 2021-09-16 DIAGNOSIS — R1013 Epigastric pain: Secondary | ICD-10-CM

## 2021-09-16 DIAGNOSIS — Z7901 Long term (current) use of anticoagulants: Secondary | ICD-10-CM | POA: Diagnosis not present

## 2021-09-16 DIAGNOSIS — D72829 Elevated white blood cell count, unspecified: Secondary | ICD-10-CM | POA: Insufficient documentation

## 2021-09-16 DIAGNOSIS — N390 Urinary tract infection, site not specified: Secondary | ICD-10-CM

## 2021-09-16 DIAGNOSIS — R109 Unspecified abdominal pain: Secondary | ICD-10-CM

## 2021-09-16 DIAGNOSIS — R350 Frequency of micturition: Secondary | ICD-10-CM | POA: Diagnosis not present

## 2021-09-16 DIAGNOSIS — Z950 Presence of cardiac pacemaker: Secondary | ICD-10-CM | POA: Diagnosis not present

## 2021-09-16 LAB — COMPREHENSIVE METABOLIC PANEL
ALT: 35 U/L (ref 0–44)
AST: 66 U/L — ABNORMAL HIGH (ref 15–41)
Albumin: 3.7 g/dL (ref 3.5–5.0)
Alkaline Phosphatase: 73 U/L (ref 38–126)
Anion gap: 8 (ref 5–15)
BUN: 25 mg/dL — ABNORMAL HIGH (ref 8–23)
CO2: 22 mmol/L (ref 22–32)
Calcium: 9.4 mg/dL (ref 8.9–10.3)
Chloride: 106 mmol/L (ref 98–111)
Creatinine, Ser: 1.49 mg/dL — ABNORMAL HIGH (ref 0.44–1.00)
GFR, Estimated: 34 mL/min — ABNORMAL LOW (ref >60)
Glucose, Bld: 98 mg/dL (ref 70–99)
Potassium: 4.3 mmol/L (ref 3.5–5.1)
Sodium: 136 mmol/L (ref 135–145)
Total Bilirubin: 1.3 mg/dL — ABNORMAL HIGH (ref 0.3–1.2)
Total Protein: 7.2 g/dL (ref 6.5–8.1)

## 2021-09-16 LAB — URINALYSIS, ROUTINE W REFLEX MICROSCOPIC
Bilirubin Urine: NEGATIVE
Glucose, UA: NEGATIVE mg/dL
Hgb urine dipstick: NEGATIVE
Ketones, ur: NEGATIVE mg/dL
Nitrite: NEGATIVE
Protein, ur: NEGATIVE mg/dL
Specific Gravity, Urine: 1.011 (ref 1.005–1.030)
pH: 5 (ref 5.0–8.0)

## 2021-09-16 LAB — CBC
HCT: 36.5 % (ref 36.0–46.0)
Hemoglobin: 11.9 g/dL — ABNORMAL LOW (ref 12.0–15.0)
MCH: 30.1 pg (ref 26.0–34.0)
MCHC: 32.6 g/dL (ref 30.0–36.0)
MCV: 92.4 fL (ref 80.0–100.0)
Platelets: 230 10*3/uL (ref 150–400)
RBC: 3.95 MIL/uL (ref 3.87–5.11)
RDW: 13.5 % (ref 11.5–15.5)
WBC: 9.7 10*3/uL (ref 4.0–10.5)
nRBC: 0 % (ref 0.0–0.2)

## 2021-09-16 LAB — LIPASE, BLOOD: Lipase: 340 U/L — ABNORMAL HIGH (ref 11–51)

## 2021-09-16 MED ORDER — FENTANYL CITRATE PF 50 MCG/ML IJ SOSY
50.0000 ug | PREFILLED_SYRINGE | Freq: Once | INTRAMUSCULAR | Status: DC
  Filled 2021-09-16: qty 1

## 2021-09-16 MED ORDER — SODIUM CHLORIDE 0.9 % IV BOLUS
1000.0000 mL | Freq: Once | INTRAVENOUS | Status: AC
  Administered 2021-09-17: 1000 mL via INTRAVENOUS

## 2021-09-16 MED ORDER — PIPERACILLIN-TAZOBACTAM 3.375 G IVPB 30 MIN
3.3750 g | Freq: Once | INTRAVENOUS | Status: AC
  Administered 2021-09-17: 3.375 g via INTRAVENOUS
  Filled 2021-09-16: qty 50

## 2021-09-16 MED ORDER — ONDANSETRON HCL 4 MG/2ML IJ SOLN
4.0000 mg | Freq: Once | INTRAMUSCULAR | Status: AC
  Administered 2021-09-17: 4 mg via INTRAVENOUS
  Filled 2021-09-16: qty 2

## 2021-09-16 MED ORDER — FAMOTIDINE IN NACL 20-0.9 MG/50ML-% IV SOLN
20.0000 mg | Freq: Once | INTRAVENOUS | Status: AC
  Administered 2021-09-17: 20 mg via INTRAVENOUS
  Filled 2021-09-16: qty 50

## 2021-09-16 NOTE — ED Provider Notes (Signed)
? ?[facility] ?Provider Note ? ? ? Event Date/Time  ? First MD Initiated Contact with Patient 09/16/21 2309   ?  (approximate) ? ? ?History  ? ?Abdominal Pain and Flank Pain ? ? ?HPI ? ?Jacqueline Lozano is a 87 y.o. female who presents to the ED from home with a chief complaint of abdominal, flank pain, nausea, dry heaving and urinary frequency.  Symptoms x1 day.  Reports midline abdominal pain after eating angel food cake and coffee yesterday.  Has chronic diarrhea for which she takes Lomotil; bowel habits have not changed.  Denies fever, chills, cough, chest pain, shortness of breath.  Status postcholecystectomy, hysterectomy and appendectomy. ?  ? ? ?Past Medical History  ? ?Past Medical History:  ?Diagnosis Date  ?? Abnormal echocardiogram 09/2009  ? EF >55%, mild LVH, moderate LAE, normal RV, normal PA systolic pressure  ?? Allergic to IV contrast   ?? Chronic diastolic CHF (congestive heart failure) ([place])   ?? Complete heart block-S./P. AV junction ablation 11/23/2010  ?? Edentulous   ? Top.  Has most of bottom teeth  ?? GERD (gastroesophageal reflux disease)   ?? Hyperlipidemia   ? Has been unable to take statins due to muscle pain. Has tried multiple statins per her report.  ?? Hypertension   ?? Hypothyroidism   ?? Migraine   ? none recently  ?? OSA (obstructive sleep apnea)   ? Not consistently using CPAP  ?? Pacemaker-Medtronic CRT P. 11/23/2010  ? Atrial port was plugged [facility] 12/22/18)  ?? PAD (peripheral artery disease) ([place])   ? Occlusive disease involving left PT and AT  ?? Permanent atrial fibrillation ([place])   ?? Uses wheelchair   ? Able to stand and self transfer.  ? ? ? ?Active Problem List  ? ?Patient Active Problem List  ? Diagnosis Date Noted  ?? Preop cardiovascular exam 08/19/2015  ?? Shortness of breath 11/20/2013  ?? Encounter for therapeutic drug monitoring 07/18/2013  ?? Numbness and tingling in hands 07/03/2013  ?? Chronic diastolic CHF (congestive heart  failure) ([place]) 02/19/2013  ?? OSA (obstructive sleep apnea) 02/19/2013  ?? Chest tightness 07/13/2012  ?? Complete heart block-S./P. AV junction ablation 11/23/2010  ?? Biventricular cardiac pacemaker in situ 11/23/2010  ?? Long term current use of anticoagulant 08/26/2010  ?? FATIGUE 04/13/2010  ?? HYPERTENSION, BENIGN 10/20/2009  ?? Hyperlipidemia 09/19/2009  ?? Permanent atrial fibrillation ([place]) 09/19/2009  ? ? ? ?Past Surgical History  ? ?Past Surgical History:  ?Procedure Laterality Date  ?? APPENDECTOMY    ?? ATRIAL ABLATION SURGERY  06/12/2010  ?? [place] N/A 12/22/2018  ? Procedure: BIV PACEMAKER GENERATOR CHANGEOUT;  Surgeon: Deboraha Sprang, MD;  Location: [place] [facility];  Service: Cardiovascular;  Laterality: N/A;  ?? CARDIAC CATHETERIZATION    ? Left heart cath x3 in teh past, last 5 yrs ago. All "normal" per pt report  ?? CARDIOVERSION    ?? CARPAL TUNNEL RELEASE    ?? CATARACT EXTRACTION W/PHACO Right 10/29/2019  ? Procedure: CATARACT EXTRACTION PHACO AND INTRAOCULAR LENS PLACEMENT (IOC) RIGHT 3.87 00:41.2;  Surgeon: Eulogio Bear, MD;  Location: [place];  Service: Ophthalmology;  Laterality: Right;  sleep apnea  ?? CATARACT EXTRACTION W/PHACO Left 12/03/2019  ? Procedure: CATARACT EXTRACTION PHACO AND INTRAOCULAR LENS PLACEMENT (IOC) LEFT 6.02  00:51.3;  Surgeon: Eulogio Bear, MD;  Location: [place];  Service: Ophthalmology;  Laterality: Left;  sleep apnea  ?? CHOLECYSTECTOMY OPEN    ??  COLONOSCOPY WITH PROPOFOL N/A 09/08/2015  ? Procedure: COLONOSCOPY WITH PROPOFOL;  Surgeon: Josefine Class, MD;  Location: [facility] ENDOSCOPY;  Service: Endoscopy;  Laterality: N/A;  ?? ESOPHAGOGASTRODUODENOSCOPY (EGD) WITH PROPOFOL N/A 09/08/2015  ? Procedure: ESOPHAGOGASTRODUODENOSCOPY (EGD) WITH PROPOFOL;  Surgeon: Josefine Class, MD;  Location: [facility] ENDOSCOPY;  Service: Endoscopy;  Laterality: N/A;  ?? PACEMAKER INSERTION  06/12/2010  ?? VESICOVAGINAL  FISTULA CLOSURE W/ TAH    ? ? ? ?Home Medications  ? ?Prior to Admission medications   ?Medication Sig Start Date End Date Taking? Authorizing Provider  ?acetaminophen (TYLENOL) 325 MG tablet Take 325 mg by mouth as needed for headache.   Yes [provider]  ?Alum Hydroxide-Mag Carbonate (GAVISCON PO) Take by mouth as needed.   Yes [provider]  ?apixaban (ELIQUIS) 2.5 MG TABS tablet Take 1 tablet by mouth twice daily 05/25/21  Yes [name], [name], MD  ?ASPIRIN 81 PO Take 1 tablet by mouth as needed. For headache   Yes [provider]  ?Aspirin-Caffeine (ANACIN PO) Take 1 tablet by mouth as needed (headache).   Yes [provider]  ?bismuth subsalicylate (PEPTO BISMOL) 262 MG chewable tablet Chew 262 mg by mouth as needed.   Yes [provider]  ?cyanocobalamin (,VITAMIN B-12,) 1000 MCG/ML injection Inject 1,000 mcg into the muscle every 30 (thirty) days. 03/30/19  Yes [provider]  ?dimenhyDRINATE (DRAMAMINE PO) Take 1 tablet by mouth as needed.   Yes [provider]  ?diphenoxylate-atropine (LOMOTIL) 2.5-0.025 MG tablet Take 1 tablet by mouth as needed for diarrhea or loose stools. 09/19/19  Yes [provider]  ?furosemide (LASIX) 20 MG tablet TAKE 1 TABLET BY MOUTH ONCE DAILY AS DIRECTED. TAKE A SECOND TABLET AFTER LUNCH IF YOU HAVE LEG SWELLING. 04/24/21  Yes [name], [name], MD  ?ibuprofen (ADVIL) 200 MG tablet Take 200 mg by mouth every 6 (six) hours as needed for headache or mild pain.   Yes [provider]  ?levothyroxine (SYNTHROID) 75 MCG tablet Take 75 mcg by mouth daily before breakfast.   Yes [provider]  ?metoprolol tartrate (LOPRESSOR) 50 MG tablet TAKE 1 & 1/2 TABLETS BY MOUTH TWICE DAILY 05/25/21  Yes [name], [name], MD  ?ondansetron (ZOFRAN-ODT) 4 MG disintegrating tablet Take 1 tablet (4 mg total) by mouth every 8 (eight) hours as needed for nausea or vomiting. 09/17/21  Yes Paulette Blanch, MD   ?potassium chloride (KLOR-CON) 10 MEQ tablet Take 1 tablet by mouth once daily 11/13/20  Yes [name], [name], MD  ?prochlorperazine (COMPAZINE) 5 MG tablet Take 1 tablet (5 mg total) by mouth every 6 (six) hours as needed. 05/22/13  Yes Minna Merritts, MD  ?traMADol (ULTRAM) 50 MG tablet Take 1 tablet (50 mg total) by mouth every 6 (six) hours as needed. 09/17/21  Yes Paulette Blanch, MD  ?nitroGLYCERIN (NITROSTAT) 0.4 MG SL tablet Place 1 tablet (0.4 mg total) under the tongue every 5 (five) minutes as needed for chest pain. 09/29/20   Minna Merritts, MD  ? ? ? ?Allergies  ?Codeine, Farxiga [dapagliflozin], Iodinated contrast media, Morphine, Other, Oxycodone, Shellfish-derived products, Dipyridamole, Iodine, Omeprazole, Oxycodone-acetaminophen, Statins, Sulfa antibiotics, Sulfonamide derivatives, and Amlodipine ? ? ?Family History  ? ?Family History  ?Problem Relation Age of Onset  ?? Heart attack Mother   ?     MI x2  ?? Heart attack Father   ?     MI  ?? Colon cancer Daughter   ??  Stroke Neg Hx   ? ? ? ?Physical Exam  ?Triage Vital Signs: ?ED Triage Vitals  ?Enc Vitals Group  ?   BP 09/16/21 1935 132/71  ?   Pulse Rate 09/16/21 1935 76  ?   Resp 09/16/21 1935 18  ?   Temp 09/16/21 1935 97.8 ?F (36.6 ?C)  ?   Temp Source 09/16/21 1935 Oral  ?   SpO2 09/16/21 1935 97 %  ?   Weight 09/16/21 1930 180 lb (81.6 kg)  ?   Height 09/16/21 1930 5' (1.524 m)  ?   Head Circumference --   ?   Peak Flow --   ?   Pain Score 09/16/21 1936 0  ?   Pain Loc --   ?   Pain Edu? --   ?   Excl. in [place]? --   ? ? ?Updated Vital Signs: ?BP (!) 127/58 (BP Location: Right Arm)   Pulse 67   Temp 97.8 ?F (36.6 ?C)   Resp 20   Ht 5' (1.524 m)   Wt 81.6 kg   SpO2 96%   BMI 35.15 kg/m?  ? ? ?General: Awake, no distress.  ?CV:  RRR.  Good peripheral perfusion.  ?Resp:  Normal effort.  CTA B. ?Abd:  Mildly tender to epigastrium without rebound or guarding.  No CVAT.  No distention.  ?Other:  No vesicles. ? ? ?ED Results / Procedures /  Treatments  ?Labs ?(all labs ordered are listed, but only abnormal results are displayed) ?Labs Reviewed  ?LIPASE, BLOOD - Abnormal; Notable for the following components:  ?    Result Value  ? Lipase 340 (*)   ? All

## 2021-09-16 NOTE — ED Triage Notes (Signed)
Pt reports urinary frequency and bilateral flank pain. Stomach pain that is not present at this time but occurred 1 hour ago. Pt reports some shortness of breath at this time. ?

## 2021-09-17 DIAGNOSIS — K859 Acute pancreatitis without necrosis or infection, unspecified: Secondary | ICD-10-CM | POA: Diagnosis not present

## 2021-09-17 MED ORDER — ONDANSETRON 4 MG PO TBDP: 4.0000 mg | ORAL_TABLET | Freq: Three times a day (TID) | ORAL | 0 refills | Status: DC | PRN

## 2021-09-17 MED ORDER — CEPHALEXIN 250 MG PO CAPS: 250.0000 mg | ORAL_CAPSULE | Freq: Three times a day (TID) | ORAL | 0 refills | Status: DC

## 2021-09-17 MED ORDER — TRAMADOL HCL 50 MG PO TABS: 50.0000 mg | ORAL_TABLET | Freq: Four times a day (QID) | ORAL | 0 refills | Status: DC | PRN

## 2021-09-17 NOTE — Discharge Instructions (Signed)
1.  Take antibiotic as prescribed (Keflex 150 mg 3 times daily x7 days). ?2.  You may take 1/2-1 Tramadol tablet as needed for pain if Tylenol does not help your pain. ?3.  You may take Zofran as needed for nausea. ?4.  Please follow the pancreatitis eating plan. ?5.  Return to the ER for worsening symptoms, persistent vomiting, difficulty breathing or other concerns. ?

## 2021-09-18 ENCOUNTER — Other Ambulatory Visit: Payer: Self-pay | Admitting: Cardiovascular Disease

## 2021-09-24 ENCOUNTER — Ambulatory Visit (INDEPENDENT_AMBULATORY_CARE_PROVIDER_SITE_OTHER): Payer: Medicare Other

## 2021-09-24 DIAGNOSIS — I5032 Chronic diastolic (congestive) heart failure: Secondary | ICD-10-CM | POA: Diagnosis not present

## 2021-09-24 DIAGNOSIS — I4821 Permanent atrial fibrillation: Secondary | ICD-10-CM

## 2021-09-24 LAB — CUP PACEART REMOTE DEVICE CHECK
Battery Remaining Longevity: 102 mo
Battery Voltage: 3 V
Brady Statistic AP VP Percent: 0 %
Brady Statistic AP VS Percent: 0 %
Brady Statistic AS VP Percent: 99.13 %
Brady Statistic AS VS Percent: 0.87 %
Brady Statistic RA Percent Paced: 0 %
Brady Statistic RV Percent Paced: 99.13 %
Date Time Interrogation Session: 20230420064455
Implantable Lead Implant Date: 20120106
Implantable Lead Implant Date: 20120106
Implantable Lead Location: 753858
Implantable Lead Location: 753860
Implantable Lead Model: 5076
Implantable Pulse Generator Implant Date: 20200717
Lead Channel Impedance Value: 1216 Ohm
Lead Channel Impedance Value: 3306 Ohm
Lead Channel Impedance Value: 3306 Ohm
Lead Channel Impedance Value: 361 Ohm
Lead Channel Impedance Value: 437 Ohm
Lead Channel Impedance Value: 627 Ohm
Lead Channel Impedance Value: 722 Ohm
Lead Channel Impedance Value: 741 Ohm
Lead Channel Impedance Value: 817 Ohm
Lead Channel Pacing Threshold Amplitude: 0.625 V
Lead Channel Pacing Threshold Pulse Width: 0.4 ms
Lead Channel Setting Pacing Amplitude: 2.5 V
Lead Channel Setting Pacing Pulse Width: 0.4 ms
Lead Channel Setting Sensing Sensitivity: 2.8 mV

## 2021-10-12 ENCOUNTER — Ambulatory Visit (INDEPENDENT_AMBULATORY_CARE_PROVIDER_SITE_OTHER): Payer: Medicare Other

## 2021-10-12 DIAGNOSIS — Z950 Presence of cardiac pacemaker: Secondary | ICD-10-CM

## 2021-10-12 DIAGNOSIS — I5032 Chronic diastolic (congestive) heart failure: Secondary | ICD-10-CM

## 2021-10-12 NOTE — Progress Notes (Signed)
Remote pacemaker transmission.   

## 2021-10-12 NOTE — Progress Notes (Signed)
EPIC Encounter for ICM Monitoring ? ?Patient Name: Jacqueline Lozano is a 87 y.o. female ?Date: 10/12/2021 ?Primary Care Physican: [name], MD ?Primary Cardiologist: Rockey Situ ?Electrophysiologist: Caryl Comes ?Bi-V Pacing:  98.6%       ?Last Weight:  unknown ?  ?Spoke with patient and heart failure questions reviewed.  Pt had ER visit with dx of pancreatitis.   She is feeling better.   ?  ?Optivol thoracic impedance suggesting possible fluid accumulation starting 4/17.  Fluid index crossed normal threshold starting 5/4. ?  ?Prescribed:  ?Furosemide 20 mg Take 1 tablet (20 mg total) by mouth as directed.  Take once daily with extra dose after lunch if you have leg swelling.   ?Potassium 10 mEq 1 tablet daily ?  ?Labs: ?09/16/2021 Creatinine 1.49, BUN 25, Potassium 4.3, Sodium 136, GFR 34 ?A complete set of results can be found in Results Review. ? ?Recommendations:  Advised to take Furosemide 2 tablets x 3 days and after 3rd day return to prescribed dosage.  Pt verbalized understanding of instructions and repeated back correctly.  Pt requested Lasix refill and sent to preferred pharmacy.  ?  ?Follow-up plan: ICM clinic phone appointment on 10/19/2021 to recheck fluid levels.  91 day device clinic remote transmission 12/24/2021.   ?  ?EP/Cardiology Office Visits:  Recall 04/24/2022 with Dr Rockey Situ.  Recall 02/05/2022 with Dr. Caryl Comes.   ?  ?Copy of ICM check sent to Dr. Caryl Comes.   ? ?3 month ICM trend: 10/12/2021. ? ? ? ?12-14 Month ICM trend:  ? ? ? ?Rosalene Billings, RN ?10/12/2021 ?1:02 PM ? ?

## 2021-10-13 MED ORDER — FUROSEMIDE 20 MG PO TABS: ORAL_TABLET | ORAL | 3 refills | Status: DC

## 2021-10-19 ENCOUNTER — Ambulatory Visit (INDEPENDENT_AMBULATORY_CARE_PROVIDER_SITE_OTHER): Payer: Medicare Other

## 2021-10-19 DIAGNOSIS — Z950 Presence of cardiac pacemaker: Secondary | ICD-10-CM

## 2021-10-19 DIAGNOSIS — I5032 Chronic diastolic (congestive) heart failure: Secondary | ICD-10-CM

## 2021-10-21 NOTE — Progress Notes (Signed)
EPIC Encounter for ICM Monitoring ? ?Patient Name: Jacqueline Lozano is a 87 y.o. female ?Date: 10/21/2021 ?Primary Care Physican: [name], MD ?Electrophysiologist: Caryl Comes ?Bi-V Pacing:  98.0%       ?Last Weight:  unknown ?  ?Spoke with patient and heart failure questions reviewed.  Pt asymptomatic for fluid accumulation.  She reports 58 yr old daughter in [place] went into afib and has fluid around heart.   ?  ?Optivol thoracic impedance suggesting fluid levels returned to normal after taking extra Furosemide. ?  ?Prescribed:  ?Furosemide 20 mg Take 1 tablet (20 mg total) by mouth as directed.  Take once daily with extra dose after lunch if you have leg swelling.   ?Potassium 10 mEq 1 tablet daily ?  ?Labs: ?09/16/2021 Creatinine 1.49, BUN 25, Potassium 4.3, Sodium 136, GFR 34 ?A complete set of results can be found in Results Review. ?  ?Recommendations:  No changes and encouraged to call if experiencing any fluid symptoms. ?  ?Follow-up plan: ICM clinic phone appointment on 11/16/2021.  91 day device clinic remote transmission 12/24/2021.   ?  ?EP/Cardiology Office Visits:  Recall 04/24/2022 with Dr Rockey Situ.  Recall 02/05/2022 with Dr. Caryl Comes.   ?  ?Copy of ICM check sent to Dr. Caryl Comes.   ? ?3 month ICM trend: 10/19/2021. ? ? ? ?12-14 Month ICM trend:  ? ? ? ?Rosalene Billings, RN ?10/21/2021 ?12:25 PM ? ?

## 2021-11-12 ENCOUNTER — Other Ambulatory Visit: Payer: Self-pay | Admitting: Cardiovascular Disease

## 2021-11-12 DIAGNOSIS — I4821 Permanent atrial fibrillation: Secondary | ICD-10-CM

## 2021-11-12 NOTE — Telephone Encounter (Signed)
Prescription refill request for Eliquis received. Indication:Afib Last office visit:11/22 Scr:1.4 Age: 87 Weight:81.6 kg  Prescription refilled

## 2021-11-12 NOTE — Telephone Encounter (Signed)
Refill Request.  

## 2021-11-16 ENCOUNTER — Ambulatory Visit (INDEPENDENT_AMBULATORY_CARE_PROVIDER_SITE_OTHER): Payer: Medicare Other

## 2021-11-16 DIAGNOSIS — Z950 Presence of cardiac pacemaker: Secondary | ICD-10-CM

## 2021-11-16 DIAGNOSIS — I5032 Chronic diastolic (congestive) heart failure: Secondary | ICD-10-CM | POA: Diagnosis not present

## 2021-11-18 NOTE — Progress Notes (Signed)
EPIC Encounter for ICM Monitoring  Patient Name: Jacqueline Lozano is a 87 y.o. female Date: 11/18/2021 Primary Care Physican: [name], MD Primary Cardiologist: Rockey Situ Electrophysiologist: Vergie Living Pacing:  97.4%       09/21/2021 Office Weight: 182 lbs   Spoke with patient and heart failure questions reviewed.  Pt asymptomatic for fluid accumulation.     Optivol thoracic impedance normal fluid levels but was suggesting possible fluid accumulation from 5/19-5/27.   Prescribed:  Furosemide 20 mg Take 1 tablet (20 mg total) by mouth as directed.  Take once daily with extra dose after lunch if you have leg swelling.   Potassium 10 mEq 1 tablet daily   Labs: 09/16/2021 Creatinine 1.49, BUN 25, Potassium 4.3, Sodium 136, GFR 34 A complete set of results can be found in Results Review.   Recommendations:  No changes and encouraged to call if experiencing any fluid symptoms.   Follow-up plan: ICM clinic phone appointment on 12/21/2021.  91 day device clinic remote transmission 12/24/2021.     EP/Cardiology Office Visits:  Recall 04/24/2022 with Dr Rockey Situ.  Recall 02/05/2022 with Dr. Caryl Comes.     Copy of ICM check sent to Dr. Caryl Comes.     3 month ICM trend: 11/16/2021.    12-14 Month ICM trend:     Rosalene Billings, RN 11/18/2021 2:56 PM

## 2021-12-21 ENCOUNTER — Ambulatory Visit (INDEPENDENT_AMBULATORY_CARE_PROVIDER_SITE_OTHER): Payer: Medicare Other

## 2021-12-21 DIAGNOSIS — I5032 Chronic diastolic (congestive) heart failure: Secondary | ICD-10-CM | POA: Diagnosis not present

## 2021-12-21 DIAGNOSIS — Z950 Presence of cardiac pacemaker: Secondary | ICD-10-CM

## 2021-12-24 ENCOUNTER — Ambulatory Visit (INDEPENDENT_AMBULATORY_CARE_PROVIDER_SITE_OTHER): Payer: Medicare Other

## 2021-12-24 DIAGNOSIS — I442 Atrioventricular block, complete: Secondary | ICD-10-CM

## 2021-12-24 LAB — CUP PACEART REMOTE DEVICE CHECK
Battery Remaining Longevity: 99 mo
Battery Voltage: 2.99 V
Brady Statistic AP VP Percent: 0 %
Brady Statistic AP VS Percent: 0 %
Brady Statistic AS VP Percent: 98.03 %
Brady Statistic AS VS Percent: 1.97 %
Brady Statistic RA Percent Paced: 0 %
Brady Statistic RV Percent Paced: 98.03 %
Date Time Interrogation Session: 20230720041456
Implantable Lead Implant Date: 20120106
Implantable Lead Implant Date: 20120106
Implantable Lead Location: 753858
Implantable Lead Location: 753860
Implantable Lead Model: 5076
Implantable Pulse Generator Implant Date: 20200717
Lead Channel Impedance Value: 1197 Ohm
Lead Channel Impedance Value: 3306 Ohm
Lead Channel Impedance Value: 3306 Ohm
Lead Channel Impedance Value: 380 Ohm
Lead Channel Impedance Value: 437 Ohm
Lead Channel Impedance Value: 646 Ohm
Lead Channel Impedance Value: 684 Ohm
Lead Channel Impedance Value: 760 Ohm
Lead Channel Impedance Value: 779 Ohm
Lead Channel Pacing Threshold Amplitude: 0.75 V
Lead Channel Pacing Threshold Pulse Width: 0.4 ms
Lead Channel Setting Pacing Amplitude: 2.5 V
Lead Channel Setting Pacing Pulse Width: 0.4 ms
Lead Channel Setting Sensing Sensitivity: 2.8 mV

## 2021-12-25 NOTE — Progress Notes (Signed)
EPIC Encounter for ICM Monitoring  Patient Name: Jacqueline Lozano is a 87 y.o. female Date: 12/25/2021 Primary Care Physican: [name], MD Primary Cardiologist: Rockey Situ Electrophysiologist: Vergie Living Pacing:  98%       09/21/2021 Office Weight: 182 lbs 7/21/202 Weight: Does not weigh at home   Spoke with patient and heart failure questions reviewed.  Pt reports feet or swollen.     Optivol thoracic impedance suggesting possible fluid accumulation starting 7/12.   Prescribed:  Furosemide 20 mg Take 1 tablet (20 mg total) by mouth as directed.  Take once daily with extra dose after lunch if you have leg swelling.   Potassium 10 mEq 1 tablet daily   Labs: 09/16/2021 Creatinine 1.49, BUN 25, Potassium 4.3, Sodium 136, GFR 34 A complete set of results can be found in Results Review.   Recommendations:  Advised to take extra Furosemide 1 tablet x 2 days only and after 2nd day return to prescribed dosage.   Follow-up plan: ICM clinic phone appointment on 01/04/2022 to recheck fluid levels.  91 day device clinic remote transmission 03/25/2022.     EP/Cardiology Office Visits:  Recall 04/24/2022 with Dr Rockey Situ.  Recall 02/05/2022 with Dr. Caryl Comes.     Copy of ICM check sent to Dr. Caryl Comes.  3 month ICM trend: 12/24/2021.    12-14 Month ICM trend:     Rosalene Billings, RN 12/25/2021 1:53 PM

## 2022-01-04 ENCOUNTER — Ambulatory Visit (INDEPENDENT_AMBULATORY_CARE_PROVIDER_SITE_OTHER): Payer: Medicare Other

## 2022-01-04 DIAGNOSIS — I5032 Chronic diastolic (congestive) heart failure: Secondary | ICD-10-CM

## 2022-01-04 DIAGNOSIS — Z950 Presence of cardiac pacemaker: Secondary | ICD-10-CM

## 2022-01-07 ENCOUNTER — Telehealth: Payer: Self-pay

## 2022-01-07 NOTE — Progress Notes (Signed)
EPIC Encounter for ICM Monitoring  Patient Name: Jacqueline Lozano is a 87 y.o. female Date: 01/07/2022 Primary Care Physican: [name], MD Primary Cardiologist: Rockey Situ Electrophysiologist: Vergie Living Pacing:  97.5%       09/21/2021 Office Weight: 182 lbs 7/21/202 Weight: Does not weigh at home   Attempted call to patient and unable to reach.   Transmission reviewed.    Optivol thoracic impedance suggesting fluid levels returned to normal after recommendation to take extra Furosemide.   Prescribed:  Furosemide 20 mg Take 1 tablet (20 mg total) by mouth as directed.  Take once daily with extra dose after lunch if you have leg swelling.   Potassium 10 mEq 1 tablet daily   Labs: 09/16/2021 Creatinine 1.49, BUN 25, Potassium 4.3, Sodium 136, GFR 34 A complete set of results can be found in Results Review.   Recommendations: Unable to reach.     Follow-up plan: ICM clinic phone appointment on 02/01/2022.  91 day device clinic remote transmission 03/25/2022.     EP/Cardiology Office Visits:  Recall 04/24/2022 with Dr Rockey Situ.  Recall 02/05/2022 with Dr. Caryl Comes.     Copy of ICM check sent to Dr. Caryl Comes.  3 month ICM trend: 01/04/2022.    12-14 Month ICM trend:     Rosalene Billings, RN 01/07/2022 12:39 PM

## 2022-01-07 NOTE — Telephone Encounter (Signed)
Remote ICM transmission received.  Attempted call to patient regarding ICM remote transmission and no answer.  

## 2022-01-15 NOTE — Progress Notes (Signed)
Remote pacemaker transmission.   

## 2022-02-01 ENCOUNTER — Ambulatory Visit (INDEPENDENT_AMBULATORY_CARE_PROVIDER_SITE_OTHER): Payer: Medicare Other

## 2022-02-01 DIAGNOSIS — I5032 Chronic diastolic (congestive) heart failure: Secondary | ICD-10-CM | POA: Diagnosis not present

## 2022-02-01 DIAGNOSIS — Z950 Presence of cardiac pacemaker: Secondary | ICD-10-CM

## 2022-02-03 NOTE — Progress Notes (Signed)
EPIC Encounter for ICM Monitoring  Patient Name: Jacqueline Lozano is a 87 y.o. female Date: 02/03/2022 Primary Care Physican: [name], MD Primary Cardiologist: Rockey Situ Electrophysiologist: Vergie Living Pacing:  98.6%       09/21/2021 Office Weight: 182 lbs 7/21/202 Weight: Does not weigh at home  Monitored VT (>4 beats) 1   Spoke with patient and heart failure questions reviewed.  Pt reports ankle swelling occasionally during decreased impedance.  She takes extra Furosemide when she has swelling.     Optivol thoracic impedance normal but was suggesting possible days with intermittent fluid accumulation.   Prescribed:  Furosemide 20 mg Take 1 tablet (20 mg total) by mouth as directed.  Take once daily with extra dose after lunch if you have leg swelling.   Potassium 10 mEq 1 tablet daily   Labs: 09/16/2021 Creatinine 1.49, BUN 25, Potassium 4.3, Sodium 136, GFR 34 A complete set of results can be found in Results Review.   Recommendations: No changes and encouraged to call if experiencing any fluid symptoms.   Follow-up plan: ICM clinic phone appointment on 03/08/2022.  91 day device clinic remote transmission 03/25/2022.     EP/Cardiology Office Visits:  04/27/2022 with Dr Rockey Situ.  04/06/2022 with Dr. Caryl Comes.     Copy of ICM check sent to Dr. Caryl Comes.  3 month ICM trend: 02/02/2022.    12-14 Month ICM trend:     Rosalene Billings, RN 02/03/2022 4:45 PM

## 2022-02-22 ENCOUNTER — Telehealth: Payer: Self-pay

## 2022-02-22 NOTE — Telephone Encounter (Signed)
The patient called to get help with her monitor. I called tech support to get additional help.  

## 2022-03-08 ENCOUNTER — Ambulatory Visit (INDEPENDENT_AMBULATORY_CARE_PROVIDER_SITE_OTHER): Payer: Medicare Other

## 2022-03-08 ENCOUNTER — Other Ambulatory Visit: Payer: Self-pay | Admitting: Cardiovascular Disease

## 2022-03-08 ENCOUNTER — Telehealth: Payer: Self-pay

## 2022-03-08 DIAGNOSIS — I5032 Chronic diastolic (congestive) heart failure: Secondary | ICD-10-CM | POA: Diagnosis not present

## 2022-03-08 DIAGNOSIS — Z950 Presence of cardiac pacemaker: Secondary | ICD-10-CM

## 2022-03-08 NOTE — Progress Notes (Signed)
EPIC Encounter for ICM Monitoring  Patient Name: Jacqueline Lozano is a 87 y.o. female Date: 03/08/2022 Primary Care Physican: [name], MD Primary Cardiologist: Rockey Situ Electrophysiologist: Vergie Living Pacing:  99.2%       09/21/2021 Office Weight: 182 lbs 7/21/202 Weight: Does not weigh at home     Attempted call to patient and unable to reach.  Left detailed message per DPR regarding transmission. Transmission reviewed.    Optivol thoracic impedance suggesting possible fluid accumulation starting 9/4 and returned to baseline 10/2.   Prescribed:  Furosemide 20 mg Take 1 tablet (20 mg total) by mouth as directed.  Take once daily with extra dose after lunch if you have leg swelling.   Potassium 10 mEq 1 tablet daily   Labs: 09/16/2021 Creatinine 1.49, BUN 25, Potassium 4.3, Sodium 136, GFR 34 A complete set of results can be found in Results Review.   Recommendations:  Left voice mail with ICM number and encouraged to call if experiencing any fluid symptoms.   Follow-up plan: ICM clinic phone appointment on 04/19/2022.  91 day device clinic remote transmission 03/25/2022.     EP/Cardiology Office Visits:  04/27/2022 with Dr Rockey Situ.  04/06/2022 with Dr. Caryl Comes.     Copy of ICM check sent to Dr. Caryl Comes.  3 month ICM trend: 03/08/2022.    12-14 Month ICM trend:     Rosalene Billings, RN 03/08/2022 8:22 AM

## 2022-03-08 NOTE — Telephone Encounter (Signed)
Remote ICM transmission received.  Attempted call to patient regarding ICM remote transmission and left detailed message per DPR.  Advised to return call for any fluid symptoms or questions. Next ICM remote transmission scheduled 04/19/2022.    

## 2022-03-25 ENCOUNTER — Ambulatory Visit (INDEPENDENT_AMBULATORY_CARE_PROVIDER_SITE_OTHER): Payer: Medicare Other

## 2022-03-25 DIAGNOSIS — I442 Atrioventricular block, complete: Secondary | ICD-10-CM

## 2022-03-25 LAB — CUP PACEART REMOTE DEVICE CHECK
Battery Remaining Longevity: 95 mo
Battery Voltage: 2.99 V
Brady Statistic AP VP Percent: 0 %
Brady Statistic AP VS Percent: 0 %
Brady Statistic AS VP Percent: 99.33 %
Brady Statistic AS VS Percent: 0.67 %
Brady Statistic RA Percent Paced: 0 %
Brady Statistic RV Percent Paced: 99.33 %
Date Time Interrogation Session: 20231019070647
Implantable Lead Implant Date: 20120106
Implantable Lead Implant Date: 20120106
Implantable Lead Location: 753858
Implantable Lead Location: 753860
Implantable Lead Model: 5076
Implantable Pulse Generator Implant Date: 20200717
Lead Channel Impedance Value: 1216 Ohm
Lead Channel Impedance Value: 3306 Ohm
Lead Channel Impedance Value: 3306 Ohm
Lead Channel Impedance Value: 361 Ohm
Lead Channel Impedance Value: 418 Ohm
Lead Channel Impedance Value: 665 Ohm
Lead Channel Impedance Value: 684 Ohm
Lead Channel Impedance Value: 760 Ohm
Lead Channel Impedance Value: 798 Ohm
Lead Channel Pacing Threshold Amplitude: 0.625 V
Lead Channel Pacing Threshold Pulse Width: 0.4 ms
Lead Channel Setting Pacing Amplitude: 2.5 V
Lead Channel Setting Pacing Pulse Width: 0.4 ms
Lead Channel Setting Sensing Sensitivity: 2.8 mV

## 2022-03-30 ENCOUNTER — Other Ambulatory Visit: Payer: Self-pay | Admitting: Cardiovascular Disease

## 2022-04-06 ENCOUNTER — Ambulatory Visit: Payer: Medicare Other | Attending: Internal Medicine | Admitting: Internal Medicine

## 2022-04-06 VITALS — BP 110/70 | HR 75 | Ht 60.0 in | Wt 177.0 lb

## 2022-04-06 DIAGNOSIS — Z950 Presence of cardiac pacemaker: Secondary | ICD-10-CM

## 2022-04-06 DIAGNOSIS — I442 Atrioventricular block, complete: Secondary | ICD-10-CM | POA: Diagnosis present

## 2022-04-06 DIAGNOSIS — I4821 Permanent atrial fibrillation: Secondary | ICD-10-CM | POA: Diagnosis present

## 2022-04-06 DIAGNOSIS — I5032 Chronic diastolic (congestive) heart failure: Secondary | ICD-10-CM | POA: Diagnosis present

## 2022-04-06 LAB — CUP PACEART INCLINIC DEVICE CHECK
Battery Remaining Longevity: 94 mo
Battery Voltage: 2.99 V
Brady Statistic AP VP Percent: 0 %
Brady Statistic AP VS Percent: 0 %
Brady Statistic AS VP Percent: 97.65 %
Brady Statistic AS VS Percent: 2.35 %
Brady Statistic RA Percent Paced: 0 %
Brady Statistic RV Percent Paced: 97.65 %
Date Time Interrogation Session: 20231031170134
Implantable Lead Connection Status: 753985
Implantable Lead Connection Status: 753985
Implantable Lead Implant Date: 20120106
Implantable Lead Implant Date: 20120106
Implantable Lead Location: 753858
Implantable Lead Location: 753860
Implantable Lead Model: 5076
Implantable Pulse Generator Implant Date: 20200717
Lead Channel Impedance Value: 1254 Ohm
Lead Channel Impedance Value: 3306 Ohm
Lead Channel Impedance Value: 3306 Ohm
Lead Channel Impedance Value: 361 Ohm
Lead Channel Impedance Value: 399 Ohm
Lead Channel Impedance Value: 646 Ohm
Lead Channel Impedance Value: 722 Ohm
Lead Channel Impedance Value: 760 Ohm
Lead Channel Impedance Value: 817 Ohm
Lead Channel Pacing Threshold Amplitude: 0.75 V
Lead Channel Pacing Threshold Pulse Width: 0.4 ms
Lead Channel Setting Pacing Amplitude: 2.5 V
Lead Channel Setting Pacing Pulse Width: 0.4 ms
Lead Channel Setting Sensing Sensitivity: 2.8 mV
Zone Setting Status: 755011

## 2022-04-06 NOTE — Progress Notes (Signed)
Patient Care Team: [name], MD as PCP - General (Family Medicine) Rockey Situ [name], MD as Consulting Physician (Cardiology)   HPI  Jacqueline Lozano is a 87 y.o. female seen in followup for pacemaker implantation for tachybradycardia syndrome. She has a history of permanent atrial fibrillation and status post CRT P. She is status post AV junction ablation.  LV lead has been permanently turned off because of diaphragmatic stimulation  Underwent generator replacement 7/20.   The patient denies chest pain, nocturnal dyspnea, orthopnea or peripheral edema.  There have been no palpitations, lightheadedness or syncope.  Complains of chronic dyspnea..  .  DATE TEST EF   2/16    Echo   EF 65 % Mild AS Grad 11 PA pressure 29  10/18    Myoview   EF 70 % No ischemia  2/19 Echo  65-70% Mild AS grad 6 PA pressure 58  10/22 Echo  60-65%     Date Cr K Hgb  4/18 1.4  12.2   1/19 1.36  12.8  4/19 1.4  13.0  6/20 1.4  12.9  6/22 1.6 4.8 12.5  4/23 1.49 4.3 96.0     Thromboembolic risk factors ( age  -2, HTN-1, Gender-1) for a CHADSVASc Score of >=4    Chronic watery stool.   Past Medical History:  Diagnosis Date   Abnormal echocardiogram 09/2009   EF >55%, mild LVH, moderate LAE, normal RV, normal PA systolic pressure   Allergic to IV contrast    Chronic diastolic CHF (congestive heart failure) (HCC)    Complete heart block-S./P. AV junction ablation 11/23/2010   Edentulous    Top.  Has most of bottom teeth   GERD (gastroesophageal reflux disease)    Hyperlipidemia    Has been unable to take statins due to muscle pain. Has tried multiple statins per her report.   Hypertension    Hypothyroidism    Migraine    none recently   OSA (obstructive sleep apnea)    Not consistently using CPAP   Pacemaker-Medtronic CRT P. 11/23/2010   Atrial port was plugged [facility] 12/22/18)   PAD (peripheral artery disease) (HCC)    Occlusive disease involving left PT and AT    Permanent atrial fibrillation ([place])    Uses wheelchair    Able to stand and self transfer.    Past Surgical History:  Procedure Laterality Date   APPENDECTOMY     ATRIAL ABLATION SURGERY  06/12/2010   BIV PACEMAKER GENERATOR CHANGEOUT N/A 12/22/2018   Procedure: BIV PACEMAKER GENERATOR CHANGEOUT;  Surgeon: Deboraha Sprang, MD;  Location: [place] [facility];  Service: Cardiovascular;  Laterality: N/A;   CARDIAC CATHETERIZATION     Left heart cath x3 in teh past, last 5 yrs ago. All "normal" per pt report   CARDIOVERSION     CARPAL TUNNEL RELEASE     CATARACT EXTRACTION W/PHACO Right 10/29/2019   Procedure: CATARACT EXTRACTION PHACO AND INTRAOCULAR LENS PLACEMENT (IOC) RIGHT 3.87 00:41.2;  Surgeon: Eulogio Bear, MD;  Location: [place];  Service: Ophthalmology;  Laterality: Right;  sleep apnea   CATARACT EXTRACTION W/PHACO Left 12/03/2019   Procedure: CATARACT EXTRACTION PHACO AND INTRAOCULAR LENS PLACEMENT (IOC) LEFT 6.02  00:51.3;  Surgeon: Eulogio Bear, MD;  Location: [place];  Service: Ophthalmology;  Laterality: Left;  sleep apnea   CHOLECYSTECTOMY OPEN     COLONOSCOPY WITH PROPOFOL N/A 09/08/2015   Procedure: COLONOSCOPY WITH PROPOFOL;  Surgeon: Josefine Class, MD;  Location[facility] ENDOSCOPY;  Service: Endoscopy;  Laterality: N/A;   ESOPHAGOGASTRODUODENOSCOPY (EGD) WITH PROPOFOL N/A 09/08/2015   Procedure: ESOPHAGOGASTRODUODENOSCOPY (EGD) WITH PROPOFOL;  Surgeon: Josefine Class, MD;  Location: [facility] ENDOSCOPY;  Service: Endoscopy;  Laterality: N/A;   PACEMAKER INSERTION  06/12/2010   VESICOVAGINAL FISTULA CLOSURE W/ TAH      Current Outpatient Medications  Medication Sig Dispense Refill   acetaminophen (TYLENOL) 325 MG tablet Take 325 mg by mouth as needed for headache.     Alum Hydroxide-Mag Carbonate (GAVISCON PO) Take by mouth as needed.     apixaban (ELIQUIS) 2.5 MG TABS tablet Take 1 tablet by mouth twice daily 180 tablet 1   ASPIRIN 81 PO  Take 1 tablet by mouth as needed. For headache     Aspirin-Caffeine (ANACIN PO) Take 1 tablet by mouth as needed (headache).     bismuth subsalicylate (PEPTO BISMOL) 262 MG chewable tablet Chew 262 mg by mouth as needed.     cephALEXin (KEFLEX) 250 MG capsule Take 1 capsule (250 mg total) by mouth 3 (three) times daily. 21 capsule 0   cyanocobalamin (,VITAMIN B-12,) 1000 MCG/ML injection Inject 1,000 mcg into the muscle every 30 (thirty) days.     dimenhyDRINATE (DRAMAMINE PO) Take 1 tablet by mouth as needed.     diphenoxylate-atropine (LOMOTIL) 2.5-0.025 MG tablet Take 1 tablet by mouth as needed for diarrhea or loose stools.     furosemide (LASIX) 20 MG tablet TAKE 1 TABLET BY MOUTH ONCE DAILY AS DIRECTED. TAKE A SECOND TABLET AFTER LUNCH IF YOU HAVE LEG SWELLING. 180 tablet 3   ibuprofen (ADVIL) 200 MG tablet Take 200 mg by mouth every 6 (six) hours as needed for headache or mild pain.     levothyroxine (SYNTHROID) 75 MCG tablet Take 75 mcg by mouth daily before breakfast.     metoprolol tartrate (LOPRESSOR) 50 MG tablet TAKE 1 & 1/2 (ONE & ONE-HALF) TABLETS BY MOUTH TWICE DAILY 90 tablet 0   nitroGLYCERIN (NITROSTAT) 0.4 MG SL tablet Place 1 tablet (0.4 mg total) under the tongue every 5 (five) minutes as needed for chest pain. 25 tablet 1   ondansetron (ZOFRAN-ODT) 4 MG disintegrating tablet Take 1 tablet (4 mg total) by mouth every 8 (eight) hours as needed for nausea or vomiting. 20 tablet 0   potassium chloride (KLOR-CON M) 10 MEQ tablet Take 1 tablet by mouth once daily 90 tablet 2   prochlorperazine (COMPAZINE) 5 MG tablet Take 1 tablet (5 mg total) by mouth every 6 (six) hours as needed. 90 tablet 5   traMADol (ULTRAM) 50 MG tablet Take 1 tablet (50 mg total) by mouth every 6 (six) hours as needed. 20 tablet 0   No current facility-administered medications for this visit.    Allergies  Allergen Reactions   Codeine Shortness Of Breath and Other (See Comments)   Farxiga  [Dapagliflozin] Other (See Comments)    "Seizure like activity"   Iodinated Contrast Media Shortness Of Breath and Other (See Comments)   Morphine Shortness Of Breath and Other (See Comments)   Other Shortness Of Breath, Nausea And Vomiting and Other (See Comments)    Contrast dye   Oxycodone Shortness Of Breath   Shellfish-Derived Products Shortness Of Breath, Nausea And Vomiting and Rash   Dipyridamole Other (See Comments)    Unknown    Iodine Other (See Comments)   Omeprazole Other (See Comments)   Oxycodone-Acetaminophen Other (See Comments)   Statins  Other (See Comments)    Unknown   Sulfa Antibiotics Other (See Comments)    Other reaction(s): Unknown   Sulfonamide Derivatives Other (See Comments)    Unknown   Amlodipine Rash and Other (See Comments)    Review of Systems negative except from HPI and PMH  Physical Exam BP 110/70 (BP Location: Left Arm, Patient Position: Sitting, Cuff Size: Large)   Pulse 75   Ht 5' (1.524 m)   Wt 177 lb (80.3 kg)   SpO2 95%   BMI 34.57 kg/m  Well developed and Morbidly obese  in no acute distress HENT normal Neck supple with JVP-flat Clear Device pocket well healed; without hematoma or erythema.  There is no tethering  Regular rate and rhythm, no  gallop No  murmur Abd-soft with active BS No Clubbing cyanosis  edema Skin-warm and dry A & Oriented  Grossly normal sensory and motor function  ECG afib with Vpacing @ 75 -/16/42  Device function is normal. Programming changes none   See Paceart for details   Assessment and  Plan  HFpEF  Atrial fibrillation-permanent  Dyspnea on exertion  Pulmonary hypertension  AV junction ablation-complete heart block  Pacemaker-CRT   LV lead inactivated secondary to diaphragmatic stimulation    Aortic stenosis-mild  Hypertension  Nonvaccinated   She uses aspirin as needed for headaches.  Not regularly.  Blood pressure well controlled, will continue her on metoprolol.  No  bleeding, continue apixaban 2.5 mg twice daily dose for renal function and age.  Euvolemic.  Continue on furosemide 20 mg a day with potassium supplementation.

## 2022-04-06 NOTE — Patient Instructions (Signed)
Medication Instructions:  - Your physician recommends that you continue on your current medications as directed. Please refer to the Current Medication list given to you today.  *If you need a refill on your cardiac medications before your next appointment, please call your pharmacy*   Lab Work: - none ordered  If you have labs (blood work) drawn today and your tests are completely normal, you will receive your results only by: MyChart Message (if you have MyChart) OR A paper copy in the mail If you have any lab test that is abnormal or we need to change your treatment, we will call you to review the results.   Testing/Procedures: - none ordered   Follow-Up: At [facility], you and your health needs are our priority.  As part of our continuing mission to provide you with exceptional heart care, we have created designated Provider Care Teams.  These Care Teams include your primary Cardiologist (physician) and Advanced Practice Providers (APPs -  Physician Assistants and Nurse Practitioners) who all work together to provide you with the care you need, when you need it.  We recommend signing up for the patient portal called "MyChart".  Sign up information is provided on this After Visit Summary.  MyChart is used to connect with patients for Virtual Visits (Telemedicine).  Patients are able to view lab/test results, encounter notes, upcoming appointments, etc.  Non-urgent messages can be sent to your provider as well.   To learn more about what you can do with MyChart, go to [email or web address].    Your next appointment:   1 year(s)  The format for your next appointment:   In Person  Provider:   Steven Klein, MD    Other Instructions N/a  Important Information About Sugar       

## 2022-04-06 NOTE — Progress Notes (Signed)
Remote pacemaker transmission.   

## 2022-04-19 ENCOUNTER — Ambulatory Visit (INDEPENDENT_AMBULATORY_CARE_PROVIDER_SITE_OTHER): Payer: Medicare Other

## 2022-04-19 DIAGNOSIS — Z950 Presence of cardiac pacemaker: Secondary | ICD-10-CM | POA: Diagnosis not present

## 2022-04-19 DIAGNOSIS — I5032 Chronic diastolic (congestive) heart failure: Secondary | ICD-10-CM | POA: Diagnosis not present

## 2022-04-23 ENCOUNTER — Telehealth: Payer: Self-pay

## 2022-04-23 NOTE — Progress Notes (Signed)
EPIC Encounter for ICM Monitoring  Patient Name: Jacqueline Lozano is a 87 y.o. female Date: 04/23/2022 Primary Care Physican: [name], MD Primary Cardiologist: Rockey Situ Electrophysiologist: Vergie Living Pacing:  99.3%       09/21/2021 Office Weight: 182 lbs 10/31/202 Office Weight: 177 lbs (does not weigh at home)     Attempted call to patient and unable to reach.  Left detailed message per DPR regarding transmission. Transmission reviewed.    Optivol thoracic impedance suggesting normal fluid levels.   Prescribed:  Furosemide 20 mg Take 1 tablet (20 mg total) by mouth as directed.  Take once daily with extra dose after lunch if you have leg swelling.   Potassium 10 mEq 1 tablet daily   Labs: 09/16/2021 Creatinine 1.49, BUN 25, Potassium 4.3, Sodium 136, GFR 34 A complete set of results can be found in Results Review.   Recommendations:  Left voice mail with ICM number and encouraged to call if experiencing any fluid symptoms.   Follow-up plan: ICM clinic phone appointment on 05/24/2022.  91 day device clinic remote transmission 06/24/2022.     EP/Cardiology Office Visits:  04/27/2022 with Dr Rockey Situ.  Recall 04/06/2023 with Dr. Caryl Comes.     Copy of ICM check sent to Dr. Caryl Comes.   3 month ICM trend: 04/19/2022.    12-14 Month ICM trend:     Rosalene Billings, RN 04/23/2022 3:02 PM

## 2022-04-23 NOTE — Telephone Encounter (Signed)
Remote ICM transmission received.  Attempted call to patient regarding ICM remote transmission and left detailed message per DPR.  Advised to return call for any fluid symptoms or questions. Next ICM remote transmission scheduled 05/24/2022.

## 2022-04-26 NOTE — Progress Notes (Unsigned)
Date:  04/27/2022   ID:  [name], DOB 01-25-1935, MRN [id number]  Patient Location:  [place] [address]   Provider location:   [facility], [place] office  PCP:  [name], MD  Cardiologist:  Arvid Right [facility]  Chief Complaint  Patient presents with   12 month follow up     Patient c/o shortness of breath at times and LE edema. Medications reviewed by the patient verbally.     History of Present Illness:    Jacqueline Lozano is a 87 y.o. female past medical history of morbid obesity, recent weight loss Chronic bilateral knee pain who has declined joint surgery Permanent atrial fibrillation,  status post arteriovenous node ablation, Pacer/CRT-P,  ventricularly paced Hypertension,  Hypothyroidism,  Diastolic congestive heart failure,  C. difficile infection, chronic diarrhea Echo 07/2014: mild AS Carotid: plaque on the right in 2014 Prior PFT's shows restrictive lung disease likely from her obseity Chronic shortness of breath multifactorial including AV node ablation, paced rhythm, old obesity, deconditioning, restrictive lung disease, aortic valve stenosis who presents for followup of her chronic shortness of breath, pacemaker, atrial fibrillation   LOV with myself November 2022  In follow-up today she presents with her daughter Lives alone [place] with a walker, no recent falls  ABD pain in 4/23, seen in the ER, Had a CT scan No acute pathology noted ABD pain, better in the ER after IV fluids  Chronic headaches, seen by neurology On Vit D, magnesium  Continues to have chronic shortness of breath, Echocardiogram with normal LV function, no indication of elevated right heart pressures Permanent atrial fibrillation ventricularly paced extensive work-up, seen pulmonary Has had cardiac work-up, unrevealing, normal ejection fraction on echo gait instability  Family feels she is deconditioned  Medication  intolerances Tried farxiga, "felt sick" Statin intolerance  EKG personally reviewed by myself on todays visit Shows ventricularly paced rhythm rate 73 beats per minute   Stress test 03/2017: normal study  Previous echocardiogram 2016 showed mild to moderate aortic valve stenosis, normal EF, normal right heart pressures   echocardiogram shows normal LV function, stable valve disease, normal right ventricular systolic pressures.   Previous visit to the emergency room for atypical chest pain. Cardiac enzymes were negative and she was discharged home Prior workup with echocardiogram in 2014 and stress test were unrevealing.    sleep study in October and again in November. Sleep study in October suggested she repeat the test as it was incomplete. The note indicates the study was discontinued as the patient had heart block, with limited EKG monitoring appearing to demonstrate a 43 second period of complete heart block with ventricular standstill. Atrial rate was 80 beats per minute. Note indicates the patient was awake and alert during the event with some dizziness. Pacer was checked after this event,  Normal functioning device.   Followup sleep study 04/24/2013 recommended weight loss, though there was no indications for CPAP at this time. Previous stress test  early 2014 showed no ischemia    Past Medical History:  Diagnosis Date   Abnormal echocardiogram 09/2009   EF >55%, mild LVH, moderate LAE, normal RV, normal PA systolic pressure   Allergic to IV contrast    Chronic diastolic CHF (congestive heart failure) (HCC)    Complete heart block-S./P. AV junction ablation 11/23/2010   Edentulous    Top.  Has most of bottom teeth   GERD (gastroesophageal reflux disease)    Hyperlipidemia  Has been unable to take statins due to muscle pain. Has tried multiple statins per her report.   Hypertension    Hypothyroidism    Migraine    none recently   OSA (obstructive sleep apnea)    Not  consistently using CPAP   Pacemaker-Medtronic CRT P. 11/23/2010   Atrial port was plugged [facility] 12/22/18)   PAD (peripheral artery disease) (HCC)    Occlusive disease involving left PT and AT   Permanent atrial fibrillation ([place])    Uses wheelchair    Able to stand and self transfer.   Past Surgical History:  Procedure Laterality Date   APPENDECTOMY     ATRIAL ABLATION SURGERY  06/12/2010   BIV PACEMAKER GENERATOR CHANGEOUT N/A 12/22/2018   Procedure: BIV PACEMAKER GENERATOR CHANGEOUT;  Surgeon: Deboraha Sprang, MD;  Location: [place] [facility];  Service: Cardiovascular;  Laterality: N/A;   CARDIAC CATHETERIZATION     Left heart cath x3 in teh past, last 5 yrs ago. All "normal" per pt report   CARDIOVERSION     CARPAL TUNNEL RELEASE     CATARACT EXTRACTION W/PHACO Right 10/29/2019   Procedure: CATARACT EXTRACTION PHACO AND INTRAOCULAR LENS PLACEMENT (IOC) RIGHT 3.87 00:41.2;  Surgeon: Eulogio Bear, MD;  Location: [place];  Service: Ophthalmology;  Laterality: Right;  sleep apnea   CATARACT EXTRACTION W/PHACO Left 12/03/2019   Procedure: CATARACT EXTRACTION PHACO AND INTRAOCULAR LENS PLACEMENT (IOC) LEFT 6.02  00:51.3;  Surgeon: Eulogio Bear, MD;  Location: [place];  Service: Ophthalmology;  Laterality: Left;  sleep apnea   CHOLECYSTECTOMY OPEN     COLONOSCOPY WITH PROPOFOL N/A 09/08/2015   Procedure: COLONOSCOPY WITH PROPOFOL;  Surgeon: Josefine Class, MD;  Location: [facility] ENDOSCOPY;  Service: Endoscopy;  Laterality: N/A;   ESOPHAGOGASTRODUODENOSCOPY (EGD) WITH PROPOFOL N/A 09/08/2015   Procedure: ESOPHAGOGASTRODUODENOSCOPY (EGD) WITH PROPOFOL;  Surgeon: Josefine Class, MD;  Location: [facility] ENDOSCOPY;  Service: Endoscopy;  Laterality: N/A;   PACEMAKER INSERTION  06/12/2010   VESICOVAGINAL FISTULA CLOSURE W/ TAH       Current Meds  Medication Sig   acetaminophen (TYLENOL) 325 MG tablet Take 325 mg by mouth as needed for headache.   Alum  Hydroxide-Mag Carbonate (GAVISCON PO) Take by mouth as needed.   apixaban (ELIQUIS) 2.5 MG TABS tablet Take 1 tablet by mouth twice daily   ASPIRIN 81 PO Take 1 tablet by mouth as needed. For headache   bismuth subsalicylate (PEPTO BISMOL) 262 MG chewable tablet Chew 262 mg by mouth as needed.   cyanocobalamin (,VITAMIN B-12,) 1000 MCG/ML injection Inject 1,000 mcg into the muscle every 30 (thirty) days.   dimenhyDRINATE (DRAMAMINE PO) Take 1 tablet by mouth as needed.   diphenoxylate-atropine (LOMOTIL) 2.5-0.025 MG tablet Take 1 tablet by mouth as needed for diarrhea or loose stools.   furosemide (LASIX) 20 MG tablet TAKE 1 TABLET BY MOUTH ONCE DAILY AS DIRECTED. TAKE A SECOND TABLET AFTER LUNCH IF YOU HAVE LEG SWELLING.   ibuprofen (ADVIL) 200 MG tablet Take 200 mg by mouth every 6 (six) hours as needed for headache or mild pain.   levothyroxine (SYNTHROID) 50 MCG tablet Take 50 mcg by mouth daily before breakfast.   MAGNESIUM GLYCINATE PO Take 1 tablet by mouth daily.   metoprolol tartrate (LOPRESSOR) 50 MG tablet TAKE 1 & 1/2 (ONE & ONE-HALF) TABLETS BY MOUTH TWICE DAILY   nitroGLYCERIN (NITROSTAT) 0.4 MG SL tablet Place 1 tablet (0.4 mg total) under the tongue every 5 (  five) minutes as needed for chest pain.   omeprazole (PRILOSEC) 20 MG capsule Take 20 mg by mouth daily.   ondansetron (ZOFRAN-ODT) 4 MG disintegrating tablet Take 1 tablet (4 mg total) by mouth every 8 (eight) hours as needed for nausea or vomiting.   potassium chloride (KLOR-CON M) 10 MEQ tablet Take 1 tablet by mouth once daily   prochlorperazine (COMPAZINE) 5 MG tablet Take 1 tablet (5 mg total) by mouth every 6 (six) hours as needed.   traMADol (ULTRAM) 50 MG tablet Take 1 tablet (50 mg total) by mouth every 6 (six) hours as needed.   Vitamin D, Ergocalciferol, (DRISDOL) 1.25 MG (50000 UNIT) CAPS capsule Take 50,000 Units by mouth once a week.     Allergies:   Codeine, Farxiga [dapagliflozin], Iodinated contrast media,  Morphine, Other, Oxycodone, Shellfish-derived products, Dipyridamole, Iodine, Omeprazole, Oxycodone-acetaminophen, Statins, Sulfa antibiotics, Sulfonamide derivatives, and Amlodipine   Social History   Tobacco Use   Smoking status: Never   Smokeless tobacco: Never   Tobacco comments:    pos smoke exposure through father and spouse who smoked in the home with her for many years  Vaping Use   Vaping Use: Never used  Substance Use Topics   Alcohol use: No   Drug use: No     Family Hx: The patient's family history includes Colon cancer in her daughter; Heart attack in her father and mother. There is no history of Stroke.  ROS:   Please see the history of present illness.    Review of Systems  Constitutional: Negative.   Respiratory:  Positive for shortness of breath.   Cardiovascular: Negative.   Gastrointestinal: Negative.   Musculoskeletal: Negative.   Neurological: Negative.   Psychiatric/Behavioral: Negative.    All other systems reviewed and are negative.   Labs/Other Tests and Data Reviewed:    Recent Labs: 09/16/2021: ALT 35; BUN 25; Creatinine, Ser 1.49; Hemoglobin 11.9; Platelets 230; Potassium 4.3; Sodium 136   Recent Lipid Panel Lab Results  Component Value Date/Time   CHOL 294 (H) 09/30/2009 09:19 PM   TRIG 459 (H) 09/30/2009 09:19 PM   HDL 44 09/30/2009 09:19 PM   CHOLHDL 6.7 Ratio 09/30/2009 09:19 PM   LDLCALC See Comment mg/dL 09/30/2009 09:19 PM   LDLDIRECT 146 (H) 10/01/2009 08:51 AM    Wt Readings from Last 3 Encounters:  04/27/22 177 lb (80.3 kg)  04/06/22 177 lb (80.3 kg)  09/16/21 180 lb (81.6 kg)     Exam:     BP (!) 140/70 (BP Location: Left Arm, Patient Position: Sitting, Cuff Size: Normal)   Pulse 73   Ht 5' (1.524 m)   Wt 177 lb (80.3 kg)   SpO2 98%   BMI 34.57 kg/m   Constitutional:  oriented to person, place, and time. No distress.  HENT:  Head: Grossly normal Eyes:  no discharge. No scleral icterus.  Neck: No JVD, no carotid  bruits  Cardiovascular: Regular rate and rhythm, no murmurs appreciated Pulmonary/Chest: Clear to auscultation bilaterally, no wheezes or rails Abdominal: Soft.  no distension.  no tenderness.  Musculoskeletal: Normal range of motion Neurological:  normal muscle tone. Coordination normal. No atrophy Skin: Skin warm and dry Psychiatric: normal affect, pleasant  ASSESSMENT & PLAN:    Atrial fibrillation, unspecified type ([place]) - Plan: EKG 12-Lead On anticoagulation, Prior AV node ablation, ventricularly paced Likely contributing to chronic SOB  Shortness of breath Chronic longstanding issue Echocardiogram and stress test , no ischemia,  normal EF Has seen  pulmonary, no further work-up at this time Recommended regular walking program, further weight loss Appears euvolemic   Pacemaker-Medtronic CRT P. Managed by Dr. Caryl Comes  Chronic diastolic CHF (congestive heart failure) (HCC)  euvolemic, no change in medication   Complete heart block-S./P. AV junction ablation pacemaker , ventricularly paced  Hyperlipidemia Previously declined statin Previously recommended statins and Zetia   Total encounter time more than 30 minutes  Greater than 50% was spent in counseling and coordination of care with the patient   Signed, Ida Rogue, MD  04/27/2022 11:34 AM     [place] Office [address], [place], [place] [address]

## 2022-04-27 ENCOUNTER — Ambulatory Visit: Payer: Medicare Other | Attending: Cardiovascular Disease | Admitting: Cardiovascular Disease

## 2022-04-27 ENCOUNTER — Encounter: Payer: Self-pay | Admitting: Cardiovascular Disease

## 2022-04-27 VITALS — BP 140/70 | HR 73 | Ht 60.0 in | Wt 177.0 lb

## 2022-04-27 DIAGNOSIS — R079 Chest pain, unspecified: Secondary | ICD-10-CM | POA: Diagnosis present

## 2022-04-27 DIAGNOSIS — R0602 Shortness of breath: Secondary | ICD-10-CM | POA: Insufficient documentation

## 2022-04-27 DIAGNOSIS — I442 Atrioventricular block, complete: Secondary | ICD-10-CM | POA: Insufficient documentation

## 2022-04-27 DIAGNOSIS — Z950 Presence of cardiac pacemaker: Secondary | ICD-10-CM | POA: Insufficient documentation

## 2022-04-27 DIAGNOSIS — I5032 Chronic diastolic (congestive) heart failure: Secondary | ICD-10-CM | POA: Diagnosis present

## 2022-04-27 DIAGNOSIS — I509 Heart failure, unspecified: Secondary | ICD-10-CM | POA: Insufficient documentation

## 2022-04-27 DIAGNOSIS — I4821 Permanent atrial fibrillation: Secondary | ICD-10-CM | POA: Diagnosis present

## 2022-04-27 MED ORDER — NITROGLYCERIN 0.4 MG SL SUBL: 0.4000 mg | SUBLINGUAL_TABLET | SUBLINGUAL | 1 refills | Status: DC | PRN

## 2022-04-27 NOTE — Patient Instructions (Signed)
Medication Instructions:  No changes  If you need a refill on your cardiac medications before your next appointment, please call your pharmacy.   Lab work: No new labs needed  Testing/Procedures: No new testing needed  Follow-Up: At [facility], you and your health needs are our priority.  As part of our continuing mission to provide you with exceptional heart care, we have created designated Provider Care Teams.  These Care Teams include your primary Cardiologist (physician) and Advanced Practice Providers (APPs -  Physician Assistants and Nurse Practitioners) who all work together to provide you with the care you need, when you need it.  You will need a follow up appointment in 12 months  Providers on your designated Care Team:   Christopher Berge, NP Ryan Dunn, PA-C Cadence Furth, PA-C  COVID-19 Vaccine Information can be found at: [email or web address] For questions related to vaccine distribution or appointments, please email [email or web address] or call [phone].   

## 2022-04-30 ENCOUNTER — Other Ambulatory Visit: Payer: Self-pay | Admitting: Cardiovascular Disease

## 2022-04-30 DIAGNOSIS — I4821 Permanent atrial fibrillation: Secondary | ICD-10-CM

## 2022-05-03 NOTE — Telephone Encounter (Signed)
Prescription refill request for Eliquis received. Indication:afib Last office visit:11/23 Scr:1.4 Age: 87 Weight:80.3  kg  Prescription refilled

## 2022-05-05 ENCOUNTER — Telehealth: Payer: Self-pay | Admitting: Cardiovascular Disease

## 2022-05-05 NOTE — Telephone Encounter (Signed)
Pt c/o medication issue:  1. Name of Medication: apixaban (ELIQUIS) 2.5 MG TABS tablet   2. How are you currently taking this medication (dosage and times per day)? Take 1 tablet by mouth twice daily   3. Are you having a reaction (difficulty breathing--STAT)?   4. What is your medication issue? Pt daughter calling stating that pt is in the donut hole and her next refill will be $400+. Pt states her sister has '5mg'$  tablets of eliquis however she is allergic and can no longer use them, she wants to know if its okay for pt to cut these in half and take them. Please advise .

## 2022-05-05 NOTE — Telephone Encounter (Signed)
Called and spoke with patients daughter Judeen Hammans. Informed her of the recommendation below. She verbalized understanding and agreed with plan.

## 2022-05-05 NOTE — Telephone Encounter (Signed)
Would make sure that it is not expired first. Technically the tablets are not designed to be split, so there is some risk, but short term should be ok.

## 2022-05-20 ENCOUNTER — Other Ambulatory Visit: Payer: Self-pay | Admitting: Cardiovascular Disease

## 2022-05-24 ENCOUNTER — Ambulatory Visit (INDEPENDENT_AMBULATORY_CARE_PROVIDER_SITE_OTHER): Payer: Medicare Other

## 2022-05-24 DIAGNOSIS — I5032 Chronic diastolic (congestive) heart failure: Secondary | ICD-10-CM

## 2022-05-24 DIAGNOSIS — Z950 Presence of cardiac pacemaker: Secondary | ICD-10-CM

## 2022-05-26 NOTE — Progress Notes (Signed)
EPIC Encounter for ICM Monitoring  Patient Name: Jacqueline Lozano is a 87 y.o. female Date: 05/26/2022 Primary Care Physican: [name], MD Primary Cardiologist: Rockey Situ Electrophysiologist: Vergie Living Pacing:  99.6%       09/21/2021 Office Weight: 182 lbs 10/31/202 Office Weight: 177 lbs (does not weigh at home)     Spoke with patient and heart failure questions reviewed.  Transmission results reviewed.  Pt asymptomatic for fluid accumulation.  Reports feeling well at this time and voices no complaints.      Optivol thoracic impedance suggesting normal fluid levels.   Prescribed:  Furosemide 20 mg Take 1 tablet (20 mg total) by mouth as directed.  Take once daily with extra dose after lunch if you have leg swelling.   Potassium 10 mEq 1 tablet daily   Labs: 09/16/2021 Creatinine 1.49, BUN 25, Potassium 4.3, Sodium 136, GFR 34 A complete set of results can be found in Results Review.   Recommendations:  No changes and encouraged to call if experiencing any fluid symptoms.   Follow-up plan: ICM clinic phone appointment on 06/28/2022.  91 day device clinic remote transmission 06/24/2022.     EP/Cardiology Office Visits:  Recall 04/27/2023 with Dr Rockey Situ.  Recall 04/06/2023 with Dr. Caryl Comes.     Copy of ICM check sent to Dr. Caryl Comes.   3 month ICM trend: 05/24/2022.    12-14 Month ICM trend:     Rosalene Billings, RN 05/26/2022 12:22 PM

## 2022-06-21 ENCOUNTER — Ambulatory Visit: Payer: Medicare Other | Attending: Cardiovascular Disease | Admitting: Cardiovascular Disease

## 2022-06-21 ENCOUNTER — Telehealth: Payer: Self-pay | Admitting: Internal Medicine

## 2022-06-21 ENCOUNTER — Encounter: Payer: Self-pay | Admitting: Cardiovascular Disease

## 2022-06-21 ENCOUNTER — Telehealth: Payer: Self-pay

## 2022-06-21 VITALS — BP 120/62 | HR 72 | Ht 60.0 in | Wt 177.0 lb

## 2022-06-21 DIAGNOSIS — I442 Atrioventricular block, complete: Secondary | ICD-10-CM | POA: Diagnosis not present

## 2022-06-21 DIAGNOSIS — R079 Chest pain, unspecified: Secondary | ICD-10-CM

## 2022-06-21 DIAGNOSIS — I5032 Chronic diastolic (congestive) heart failure: Secondary | ICD-10-CM

## 2022-06-21 DIAGNOSIS — I4821 Permanent atrial fibrillation: Secondary | ICD-10-CM

## 2022-06-21 DIAGNOSIS — R0602 Shortness of breath: Secondary | ICD-10-CM

## 2022-06-21 DIAGNOSIS — I509 Heart failure, unspecified: Secondary | ICD-10-CM | POA: Diagnosis present

## 2022-06-21 MED ORDER — NITROGLYCERIN 0.4 MG SL SUBL: 0.4000 mg | SUBLINGUAL_TABLET | SUBLINGUAL | 3 refills | Status: DC | PRN

## 2022-06-21 NOTE — Telephone Encounter (Signed)
Attempted return call to daughter Judeen Hammans per [place].  Left message was returning call and provided ICM number.

## 2022-06-21 NOTE — Telephone Encounter (Signed)
Incoming call from the patient. She stated that she has been having chest pain on exertion and shortness of breath since last night. She stated that the pain intensifies when she takes a deep breath. The pain is 10/10. She has been advised that this may not be cardiac related since it hurts worse when she takes a deep breath and that she may have pulled something. She stated that she has not pulled a muscle and that the pain radiates to the back.   The patient requested an appointment wit Dr. Rockey Situ today and would only like advice from him. Appointment made today at 3:20. She has been advised that if she is having chest pain that is not going away then she should go to the ED. She verbalized her understanding.

## 2022-06-21 NOTE — Patient Instructions (Addendum)
Medication Instructions:  ?No changes ? ?If you need a refill on your cardiac medications before your next appointment, please call your pharmacy.  ? ?Lab work: ?No new labs needed ? ?Testing/Procedures: ?No new testing needed ? ?Follow-Up: ?At [facility], you and your health needs are our priority.  As part of our continuing mission to provide you with exceptional heart care, we have created designated Provider Care Teams.  These Care Teams include your primary Cardiologist (physician) and Advanced Practice Providers (APPs -  Physician Assistants and Nurse Practitioners) who all work together to provide you with the care you need, when you need it. ? ?You will need a follow up appointment in 6 months ? ?Providers on your designated Care Team:   ?Christopher Berge, NP ?Ryan Dunn, PA-C ?Cadence Furth, PA-C ? ?COVID-19 Vaccine Information can be found at: [email or web address] For questions related to vaccine distribution or appointments, please email [email or web address] or call [phone].  ? ?

## 2022-06-21 NOTE — Progress Notes (Signed)
Date:  06/21/2022   ID:  Jacqueline Lozano, DOB [birth year], MRN [id number]  Patient Location:  [place] [address]   Provider location:   [facility], [place] office  PCP:  [name], MD  Cardiologist:  [facility]  Chief Complaint  Patient presents with   Chest pain & Shortness of breath     Patient c/o shortness of breath and chest pain that started yesterday, patient took one NTG tablet this morning. Medications reviewed by the patient verbally.     History of Present Illness:    Jacqueline Lozano is a 88 y.o. female past medical history of morbid obesity, recent weight loss Chronic bilateral knee pain who has declined joint surgery Permanent atrial fibrillation,  status post arteriovenous node ablation, Pacer/CRT-P,  ventricularly paced Hypertension,  Hypothyroidism,  Diastolic congestive heart failure,  C. difficile infection, chronic diarrhea Echo 07/2014: mild AS Carotid: plaque on the right in 2014 Prior PFT's shows restrictive lung disease likely from her obseity Chronic shortness of breath multifactorial including AV node ablation, paced rhythm, old obesity, deconditioning, restrictive lung disease, aortic valve stenosis who presents for followup of her chronic shortness of breath, pacemaker, atrial fibrillation   LOV with myself November 2022  Reports waking with Sharp chest pain on left, under left breast, Took NTG SL, no relief Pain somewhat better, with deep inspiration she has discomfort Pushing on left ribs has discomfort, pushing on left scapular region has significant discomfort  Reports she has been coughing recently Daughter feels coughing may have hurt her ribs  Lives alone Walking with a walker  EKG personally reviewed by myself on todays visit Paced rhythm rate 72 bpm unchanged from 4/23   ABD pain in 4/23, seen in the ER, Had a CT scan No acute pathology noted ABD pain, better in the  ER after IV fluids  Chronic headaches, seen by neurology On Vit D, magnesium  Continues to have chronic shortness of breath, Echocardiogram with normal LV function, no indication of elevated right heart pressures Permanent atrial fibrillation ventricularly paced extensive work-up, seen pulmonary Has had cardiac work-up, unrevealing, normal ejection fraction on echo gait instability  Family feels she is deconditioned  Medication intolerances Tried farxiga, "felt sick" Statin intolerance  Stress test 03/2017: normal study  Previous echocardiogram 2016 showed mild to moderate aortic valve stenosis, normal EF, normal right heart pressures   echocardiogram shows normal LV function, stable valve disease, normal right ventricular systolic pressures.   Previous visit to the emergency room for atypical chest pain. Cardiac enzymes were negative and she was discharged home Prior workup with echocardiogram in 2014 and stress test were unrevealing.    sleep study in October and again in November. Sleep study in October suggested she repeat the test as it was incomplete. The note indicates the study was discontinued as the patient had heart block, with limited EKG monitoring appearing to demonstrate a 43 second period of complete heart block with ventricular standstill. Atrial rate was 80 beats per minute. Note indicates the patient was awake and alert during the event with some dizziness. Pacer was checked after this event,  Normal functioning device.   Followup sleep study 04/24/2013 recommended weight loss, though there was no indications for CPAP at this time. Previous stress test  early 2014 showed no ischemia    Past Medical History:  Diagnosis Date   Abnormal echocardiogram 09/2009   EF >55%, mild LVH, moderate LAE, normal RV,  normal PA systolic pressure   Allergic to IV contrast    Chronic diastolic CHF (congestive heart failure) (HCC)    Complete heart block-S./P. AV junction  ablation 11/23/2010   Edentulous    Top.  Has most of bottom teeth   GERD (gastroesophageal reflux disease)    Hyperlipidemia    Has been unable to take statins due to muscle pain. Has tried multiple statins per her report.   Hypertension    Hypothyroidism    Migraine    none recently   OSA (obstructive sleep apnea)    Not consistently using CPAP   Pacemaker-Medtronic CRT P. 11/23/2010   Atrial port was plugged [facility] 12/22/18)   PAD (peripheral artery disease) (HCC)    Occlusive disease involving left PT and AT   Permanent atrial fibrillation ([place])    Uses wheelchair    Able to stand and self transfer.   Past Surgical History:  Procedure Laterality Date   APPENDECTOMY     ATRIAL ABLATION SURGERY  06/12/2010   BIV PACEMAKER GENERATOR CHANGEOUT N/A 12/22/2018   Procedure: BIV PACEMAKER GENERATOR CHANGEOUT;  Surgeon: Deboraha Sprang, MD;  Location: [facility];  Service: Cardiovascular;  Laterality: N/A;   CARDIAC CATHETERIZATION     Left heart cath x3 in teh past, last 5 yrs ago. All "normal" per pt report   CARDIOVERSION     CARPAL TUNNEL RELEASE     CATARACT EXTRACTION W/PHACO Right 10/29/2019   Procedure: CATARACT EXTRACTION PHACO AND INTRAOCULAR LENS PLACEMENT (IOC) RIGHT 3.87 00:41.2;  Surgeon: Eulogio Bear, MD;  Location: [facility];  Service: Ophthalmology;  Laterality: Right;  sleep apnea   CATARACT EXTRACTION W/PHACO Left 12/03/2019   Procedure: CATARACT EXTRACTION PHACO AND INTRAOCULAR LENS PLACEMENT (IOC) LEFT 6.02  00:51.3;  Surgeon: Eulogio Bear, MD;  Location: [facility];  Service: Ophthalmology;  Laterality: Left;  sleep apnea   CHOLECYSTECTOMY OPEN     COLONOSCOPY WITH PROPOFOL N/A 09/08/2015   Procedure: COLONOSCOPY WITH PROPOFOL;  Surgeon: Josefine Class, MD;  Location: [facility] ENDOSCOPY;  Service: Endoscopy;  Laterality: N/A;   ESOPHAGOGASTRODUODENOSCOPY (EGD) WITH PROPOFOL N/A 09/08/2015   Procedure:  ESOPHAGOGASTRODUODENOSCOPY (EGD) WITH PROPOFOL;  Surgeon: Josefine Class, MD;  Location: [facility] ENDOSCOPY;  Service: Endoscopy;  Laterality: N/A;   PACEMAKER INSERTION  06/12/2010   VESICOVAGINAL FISTULA CLOSURE W/ TAH       Current Meds  Medication Sig   acetaminophen (TYLENOL) 325 MG tablet Take 325 mg by mouth as needed for headache.   Alum Hydroxide-Mag Carbonate (GAVISCON PO) Take by mouth as needed.   apixaban (ELIQUIS) 2.5 MG TABS tablet Take 1 tablet by mouth twice daily   ASPIRIN 81 PO Take 1 tablet by mouth as needed. For headache   bismuth subsalicylate (PEPTO BISMOL) 262 MG chewable tablet Chew 262 mg by mouth as needed.   cholecalciferol (VITAMIN D3) 25 MCG (1000 UNIT) tablet Take 1,000 Units by mouth daily.   cyanocobalamin (,VITAMIN B-12,) 1000 MCG/ML injection Inject 1,000 mcg into the muscle every 30 (thirty) days.   dimenhyDRINATE (DRAMAMINE PO) Take 1 tablet by mouth as needed.   diphenoxylate-atropine (LOMOTIL) 2.5-0.025 MG tablet Take 1 tablet by mouth as needed for diarrhea or loose stools.   furosemide (LASIX) 20 MG tablet TAKE 1 TABLET BY MOUTH ONCE DAILY AS DIRECTED. TAKE A SECOND TABLET AFTER LUNCH IF YOU HAVE LEG SWELLING.   ibuprofen (ADVIL) 200 MG tablet Take 200 mg by mouth every 6 (six) hours  as needed for headache or mild pain.   levothyroxine (SYNTHROID) 50 MCG tablet Take 50 mcg by mouth daily before breakfast.   MAGNESIUM GLYCINATE PO Take 1 tablet by mouth daily.   metoprolol tartrate (LOPRESSOR) 50 MG tablet TAKE 1 & 1/2 (ONE & ONE-HALF) TABLETS BY MOUTH TWICE DAILY   omeprazole (PRILOSEC) 20 MG capsule Take 20 mg by mouth daily.   potassium chloride (KLOR-CON M) 10 MEQ tablet Take 1 tablet by mouth once daily   prochlorperazine (COMPAZINE) 5 MG tablet Take 1 tablet (5 mg total) by mouth every 6 (six) hours as needed.   [DISCONTINUED] nitroGLYCERIN (NITROSTAT) 0.4 MG SL tablet Place 1 tablet (0.4 mg total) under the tongue every 5 (five) minutes as  needed for chest pain.     Allergies:   Codeine, Farxiga [dapagliflozin], Iodinated contrast media, Morphine, Other, Oxycodone, Shellfish-derived products, Dipyridamole, Iodine, Omeprazole, Oxycodone-acetaminophen, Statins, Sulfa antibiotics, Sulfonamide derivatives, and Amlodipine   Social History   Tobacco Use   Smoking status: Never   Smokeless tobacco: Never   Tobacco comments:    pos smoke exposure through father and spouse who smoked in the home with her for many years  Vaping Use   Vaping Use: Never used  Substance Use Topics   Alcohol use: No   Drug use: No     Family Hx: The patient's family history includes Colon cancer in her daughter; Heart attack in her father and mother. There is no history of Stroke.  ROS:   Please see the history of present illness.    Review of Systems  Constitutional: Negative.   HENT: Negative.    Respiratory:  Positive for shortness of breath.   Cardiovascular:  Positive for chest pain.  Gastrointestinal: Negative.   Musculoskeletal: Negative.   Neurological: Negative.   Psychiatric/Behavioral: Negative.    All other systems reviewed and are negative.   Labs/Other Tests and Data Reviewed:    Recent Labs: 09/16/2021: ALT 35; BUN 25; Creatinine, Ser 1.49; Hemoglobin 11.9; Platelets 230; Potassium 4.3; Sodium 136   Recent Lipid Panel Lab Results  Component Value Date/Time   CHOL 294 (H) 09/30/2009 09:19 PM   TRIG 459 (H) 09/30/2009 09:19 PM   HDL 44 09/30/2009 09:19 PM   CHOLHDL 6.7 Ratio 09/30/2009 09:19 PM   LDLCALC See Comment mg/dL 09/30/2009 09:19 PM   LDLDIRECT 146 (H) 10/01/2009 08:51 AM    Wt Readings from Last 3 Encounters:  06/21/22 177 lb (80.3 kg)  04/27/22 177 lb (80.3 kg)  04/06/22 177 lb (80.3 kg)     Exam:     BP 120/62 (BP Location: Left Arm, Patient Position: Sitting, Cuff Size: Large)   Pulse 72   Ht 5' (1.524 m)   Wt 177 lb (80.3 kg)   SpO2 98%   BMI 34.57 kg/m   Constitutional:  oriented to  person, place, and time. No distress.  HENT:  Head: Grossly normal Eyes:  no discharge. No scleral icterus.  Neck: No JVD, no carotid bruits  Cardiovascular: Regular rate and rhythm, no murmurs appreciated Pulmonary/Chest: Clear to auscultation bilaterally, no wheezes or rails Abdominal: Soft.  no distension.  no tenderness.  Musculoskeletal: Normal range of motion, pain on palpation of left chest ribs, radiating around to scapular area Neurological:  normal muscle tone. Coordination normal. No atrophy Skin: Skin warm and dry Psychiatric: normal affect, pleasant   ASSESSMENT & PLAN:    Chest pain Atypical chest wall pain, reproducible on palpation movement and deep breathing Likely musculoskeletal  discomfort from heavy coughing Lungs otherwise clear, does not sound like bronchitis, more likely some postnasal drip Recommended hot packs, if symptoms persist may need muscle relaxer pill  Atrial fibrillation, unspecified type ([place]) - Plan: EKG 12-Lead On anticoagulation, Prior AV node ablation, ventricularly paced Likely contributor to chronic shortness of breath  Shortness of breath Chronic longstanding issue Significant prior workup including Echocardiogram and stress test , no ischemia,  normal EF Previously evaluated by pulmonary Appears euvolemic Recommended regular walking program   Pacemaker-Medtronic CRT P. Managed by Dr. Caryl Comes  Chronic diastolic CHF (congestive heart failure) (HCC)  euvolemic, no change in medication   Complete heart block-S./P. AV junction ablation pacemaker , ventricularly paced  Hyperlipidemia Previously declined statin Previously recommended statins and Zetia   Total encounter time more than 30 minutes  Greater than 50% was spent in counseling and coordination of care with the patient   Signed, Ida Rogue, MD  06/21/2022 5:30 PM     [place] [facility] [address], [place], [place]  [address]

## 2022-06-21 NOTE — Telephone Encounter (Signed)
Pt c/o of Chest Pain: STAT if CP now or developed within 24 hours  1. Are you having CP right now? Yes started yesterday   2. Are you experiencing any other symptoms (ex. SOB, nausea, vomiting, sweating)? "SOB worse than normal"   3. How long have you been experiencing CP? Since yesterday  4. Is your CP continuous or coming and going? "Did not say" she is not with the pt   5. Have you taken Nitroglycerin? "Did not mention that she took one"  ?

## 2022-06-24 ENCOUNTER — Ambulatory Visit: Payer: Medicare Other | Attending: Internal Medicine

## 2022-06-24 DIAGNOSIS — I442 Atrioventricular block, complete: Secondary | ICD-10-CM | POA: Diagnosis not present

## 2022-06-24 LAB — CUP PACEART REMOTE DEVICE CHECK
Battery Remaining Longevity: 94 mo
Battery Voltage: 2.99 V
Brady Statistic AP VP Percent: 0 %
Brady Statistic AP VS Percent: 0 %
Brady Statistic AS VP Percent: 99.88 %
Brady Statistic AS VS Percent: 0.12 %
Brady Statistic RA Percent Paced: 0 %
Brady Statistic RV Percent Paced: 99.88 %
Date Time Interrogation Session: 20240118034249
Implantable Lead Connection Status: 753985
Implantable Lead Connection Status: 753985
Implantable Lead Implant Date: 20120106
Implantable Lead Implant Date: 20120106
Implantable Lead Location: 753858
Implantable Lead Location: 753860
Implantable Lead Model: 5076
Implantable Pulse Generator Implant Date: 20200717
Lead Channel Impedance Value: 1330 Ohm
Lead Channel Impedance Value: 3306 Ohm
Lead Channel Impedance Value: 3306 Ohm
Lead Channel Impedance Value: 380 Ohm
Lead Channel Impedance Value: 437 Ohm
Lead Channel Impedance Value: 665 Ohm
Lead Channel Impedance Value: 703 Ohm
Lead Channel Impedance Value: 722 Ohm
Lead Channel Impedance Value: 798 Ohm
Lead Channel Pacing Threshold Amplitude: 0.625 V
Lead Channel Pacing Threshold Pulse Width: 0.4 ms
Lead Channel Setting Pacing Amplitude: 2.5 V
Lead Channel Setting Pacing Pulse Width: 0.4 ms
Lead Channel Setting Sensing Sensitivity: 2.8 mV
Zone Setting Status: 755011

## 2022-06-28 ENCOUNTER — Ambulatory Visit (INDEPENDENT_AMBULATORY_CARE_PROVIDER_SITE_OTHER): Payer: Medicare Other

## 2022-06-28 DIAGNOSIS — I5032 Chronic diastolic (congestive) heart failure: Secondary | ICD-10-CM | POA: Diagnosis not present

## 2022-06-28 DIAGNOSIS — Z950 Presence of cardiac pacemaker: Secondary | ICD-10-CM

## 2022-06-28 NOTE — Progress Notes (Signed)
EPIC Encounter for ICM Monitoring  Patient Name: Jacqueline Lozano is a 88 y.o. female Date: 06/28/2022 Primary Care Physican: [name], MD Primary Cardiologist: Rockey Situ Electrophysiologist: Vergie Living Pacing:  99.6%       09/21/2021 Office Weight: 182 lbs 10/31/202 Office Weight: 177 lbs (does not weigh at home)     Spoke with patient and heart failure questions reviewed.  Transmission results reviewed.  Pt reports swelling in feet.  She had chest pain on 1/15 and seen by Dr Rockey Situ in the office.  Discussed device detects when there is a rhythm problem with the heart and cannot detect heart attack.     Optivol thoracic impedance suggesting possible fluid accumulation starting 1/19.   Prescribed:  Furosemide 20 mg Take 1 tablet (20 mg total) by mouth as directed.  Take once daily with extra dose after lunch if you have leg swelling.   Potassium 10 mEq 1 tablet daily   Labs: 09/16/2021 Creatinine 1.49, BUN 25, Potassium 4.3, Sodium 136, GFR 34 A complete set of results can be found in Results Review.   Recommendations:   Advised to take 1 extra Furosemide tablet x 1 day only and then return to prescribed dosage of 1 tablet daily.  She repeated instructions back correctly.  Advised to limit salt and fluid intake.   Follow-up plan: ICM clinic phone appointment on 07/05/2022 to recheck fluid levels.  91 day device clinic remote transmission 09/23/2022.     EP/Cardiology Office Visits:  Recall 04/27/2023 with Dr Rockey Situ.  Recall 04/06/2023 with Dr. Caryl Comes.     Copy of ICM check sent to Dr. Caryl Comes.    3 month ICM trend: 06/28/2022.    12-14 Month ICM trend:     Rosalene Billings, RN 06/28/2022 10:46 AM

## 2022-07-05 ENCOUNTER — Ambulatory Visit: Payer: Medicare Other | Attending: Internal Medicine

## 2022-07-05 DIAGNOSIS — I5032 Chronic diastolic (congestive) heart failure: Secondary | ICD-10-CM

## 2022-07-05 DIAGNOSIS — Z950 Presence of cardiac pacemaker: Secondary | ICD-10-CM

## 2022-07-05 NOTE — Progress Notes (Unsigned)
EPIC Encounter for ICM Monitoring  Patient Name: Jacqueline Lozano is a 88 y.o. female Date: 07/05/2022 Primary Care Physican: [name], MD Primary Cardiologist: Rockey Situ Electrophysiologist: Vergie Living Pacing:  99.6%       09/21/2021 Office Weight: 182 lbs 10/31/202 Office Weight: 177 lbs (does not weigh at home) 07/06/2022 Weight: not weighing at home     Spoke with patient and heart failure questions reviewed.  Transmission results reviewed.  Pt reports chronic swelling of feet but no worse than usual.    Optivol thoracic impedance suggesting fluid levels returned to normal after taking extra Furosemide tablet.   Prescribed:  Furosemide 20 mg Take 1 tablet (20 mg total) by mouth as directed.  Take once daily with extra dose after lunch if you have leg swelling.   Potassium 10 mEq 1 tablet daily   Labs: 09/16/2021 Creatinine 1.49, BUN 25, Potassium 4.3, Sodium 136, GFR 34 A complete set of results can be found in Results Review.   Recommendations:   No changes and encouraged to call if experiencing any fluid symptoms.   Follow-up plan: ICM clinic phone appointment on 08/02/2022.  91 day device clinic remote transmission 09/23/2022.     EP/Cardiology Office Visits:  Recall 04/27/2023 with Dr Rockey Situ.  Recall 04/06/2023 with Dr. Caryl Comes.     Copy of ICM check sent to Dr. Caryl Comes.   3 month ICM trend: 07/05/2022.    12-14 Month ICM trend:     Rosalene Billings, RN 07/05/2022 3:32 PM

## 2022-07-13 ENCOUNTER — Encounter: Payer: Self-pay | Admitting: Neurology

## 2022-07-13 DIAGNOSIS — G43E19 Chronic migraine with aura, intractable, without status migrainosus: Secondary | ICD-10-CM

## 2022-07-14 ENCOUNTER — Other Ambulatory Visit: Payer: Self-pay | Admitting: Neurology

## 2022-07-14 DIAGNOSIS — G43E19 Chronic migraine with aura, intractable, without status migrainosus: Secondary | ICD-10-CM

## 2022-07-14 DIAGNOSIS — G43711 Chronic migraine without aura, intractable, with status migrainosus: Secondary | ICD-10-CM

## 2022-07-15 NOTE — Progress Notes (Signed)
Remote pacemaker transmission.   

## 2022-07-20 ENCOUNTER — Ambulatory Visit
Admission: RE | Admit: 2022-07-20 | Discharge: 2022-07-20 | Disposition: A | Discharge status: Home / Self Care | Payer: Medicare Other | Source: Ambulatory Visit | Attending: Neurology | Admitting: Neurology

## 2022-07-20 DIAGNOSIS — G43711 Chronic migraine without aura, intractable, with status migrainosus: Secondary | ICD-10-CM

## 2022-07-20 DIAGNOSIS — G43E19 Chronic migraine with aura, intractable, without status migrainosus: Secondary | ICD-10-CM | POA: Diagnosis present

## 2022-08-02 ENCOUNTER — Telehealth: Payer: Self-pay

## 2022-08-02 ENCOUNTER — Ambulatory Visit: Payer: Medicare Other | Attending: Internal Medicine

## 2022-08-02 DIAGNOSIS — Z950 Presence of cardiac pacemaker: Secondary | ICD-10-CM | POA: Diagnosis not present

## 2022-08-02 DIAGNOSIS — I5032 Chronic diastolic (congestive) heart failure: Secondary | ICD-10-CM

## 2022-08-02 NOTE — Progress Notes (Signed)
EPIC Encounter for ICM Monitoring  Patient Name: Jacqueline Lozano is a 88 y.o. female Date: 08/02/2022 Primary Care Physican: [name], MD Primary Cardiologist: Rockey Situ Electrophysiologist: Vergie Living Pacing:  99.9%       09/21/2021 Office Weight: 182 lbs 10/31/202 Office Weight: 177 lbs (does not weigh at home) 07/06/2022 Weight: not weighing at home     Attempted call to patient and unable to reach.  Transmission reviewed.    Chronic symptoms include swelling of feet.     Optivol thoracic impedance suggesting possible fluid accumulation starting 2/10 and trending back close to normal.   Prescribed:  Furosemide 20 mg Take 1 tablet (20 mg total) by mouth as directed.  Take once daily with extra dose after lunch if you have leg swelling.   Potassium 10 mEq 1 tablet daily   Labs: 09/16/2021 Creatinine 1.49, BUN 25, Potassium 4.3, Sodium 136, GFR 34 A complete set of results can be found in Results Review.   Recommendations:  Unable to reach.     Follow-up plan: ICM clinic phone appointment on 09/06/2022.  91 day device clinic remote transmission 09/23/2022.     EP/Cardiology Office Visits:  Recall 04/27/2023 with Dr Rockey Situ.  Recall 04/06/2023 with Dr. Caryl Comes.     Copy of ICM check sent to Dr. Caryl Comes.   3 month ICM trend: 08/02/2022.    12-14 Month ICM trend:     Rosalene Billings, RN 08/02/2022 9:41 AM

## 2022-08-02 NOTE — Telephone Encounter (Signed)
Remote ICM transmission received.  Attempted call to patient regarding ICM remote transmission and no answer.  

## 2022-08-07 ENCOUNTER — Other Ambulatory Visit: Payer: Self-pay | Admitting: Cardiovascular Disease

## 2022-08-12 ENCOUNTER — Telehealth: Payer: Self-pay | Admitting: Cardiovascular Disease

## 2022-08-12 MED ORDER — METOPROLOL TARTRATE 50 MG PO TABS: ORAL_TABLET | ORAL | 0 refills | Status: DC

## 2022-08-12 NOTE — Telephone Encounter (Signed)
*  STAT* If patient is at the pharmacy, call can be transferred to refill team.   1. Which medications need to be refilled? (please list name of each medication and dose if known) metoprolol tartrate (LOPRESSOR) 50 MG tablet   2. Which pharmacy/location (including street and city if local pharmacy) is medication to be sent to? [place], [place]   3. Do they need a 30 day or 90 day supply?  [place]

## 2022-09-06 ENCOUNTER — Ambulatory Visit: Payer: Medicare Other | Attending: Internal Medicine

## 2022-09-06 ENCOUNTER — Telehealth: Payer: Self-pay

## 2022-09-06 DIAGNOSIS — I5032 Chronic diastolic (congestive) heart failure: Secondary | ICD-10-CM

## 2022-09-06 DIAGNOSIS — Z950 Presence of cardiac pacemaker: Secondary | ICD-10-CM

## 2022-09-06 NOTE — Telephone Encounter (Signed)
Remote ICM transmission received.  Attempted call to patient regarding ICM remote transmission and no answer.  

## 2022-09-06 NOTE — Progress Notes (Signed)
EPIC Encounter for ICM Monitoring  Patient Name: Jacqueline Lozano is a 88 y.o. female Date: 09/06/2022 Primary Care Physican: [name], MD Primary Cardiologist: Rockey Situ Electrophysiologist: Vergie Living Pacing:  99.9%       09/21/2021 Office Weight: 182 lbs 10/31/202 Office Weight: 177 lbs (does not weigh at home) 07/06/2022 Weight: not weighing at home     Attempted call to patient and unable to reach.  Transmission reviewed.     Chronic symptoms include swelling of feet.     Optivol thoracic impedance suggesting possible fluid accumulation starting 3/26.   Prescribed:  Furosemide 20 mg Take 1 tablet (20 mg total) by mouth as directed.  Take once daily with extra dose after lunch if you have leg swelling.   Potassium 10 mEq 1 tablet daily   Labs: 09/16/2021 Creatinine 1.49, BUN 25, Potassium 4.3, Sodium 136, GFR 34 A complete set of results can be found in Results Review.   Recommendations:  Unable to reach.     Follow-up plan: ICM clinic phone appointment on 09/13/2022 to recheck fluid levels.  91 day device clinic remote transmission 09/23/2022.     EP/Cardiology Office Visits:  Recall 04/27/2023 with Dr Rockey Situ.  Recall 04/06/2023 with Dr. Caryl Comes.     Copy of ICM check sent to Dr. Caryl Comes.   3 month ICM trend: 09/06/2022.    12-14 Month ICM trend:     Rosalene Billings, RN 09/06/2022 3:16 PM

## 2022-09-13 ENCOUNTER — Ambulatory Visit: Payer: Medicare Other | Attending: Internal Medicine

## 2022-09-13 DIAGNOSIS — I5032 Chronic diastolic (congestive) heart failure: Secondary | ICD-10-CM

## 2022-09-13 DIAGNOSIS — Z950 Presence of cardiac pacemaker: Secondary | ICD-10-CM

## 2022-09-14 ENCOUNTER — Telehealth: Payer: Self-pay

## 2022-09-14 NOTE — Progress Notes (Signed)
EPIC Encounter for ICM Monitoring  Patient Name: Jacqueline Lozano is a 88 y.o. female Date: 09/14/2022 Primary Care Physican: Jerl Mina, MD Primary Cardiologist: Mariah Milling Electrophysiologist: Joycelyn Schmid Pacing:  99.7%       09/21/2021 Office Weight: 182 lbs 10/31/202 Office Weight: 177 lbs (does not weigh at home) 07/06/2022 Weight: not weighing at home     Attempted call to patient and unable to reach.  Transmission reviewed.     Chronic symptoms include swelling of feet.     Optivol thoracic impedance suggesting possible fluid accumulation continues since- 3/26.   Prescribed:  Furosemide 20 mg Take 1 tablet (20 mg total) by mouth as directed.  Take once daily with extra dose after lunch if you have leg swelling.   Potassium 10 mEq 1 tablet daily   Labs: 09/16/2021 Creatinine 1.49, BUN 25, Potassium 4.3, Sodium 136, GFR 34 A complete set of results can be found in Results Review.   Recommendations:  Unable to reach.     Follow-up plan: ICM clinic phone appointment on 09/20/2022 to recheck fluid levels.  91 day device clinic remote transmission 09/23/2022.     EP/Cardiology Office Visits:  Recall 04/27/2023 with Dr Mariah Milling.  Recall 04/06/2023 with Dr. Graciela Husbands.     Copy of ICM check sent to Dr. Graciela Husbands.    3 month ICM trend: 09/13/2022.    12-14 Month ICM trend:     Karie Soda, RN 09/14/2022 1:10 PM

## 2022-09-14 NOTE — Telephone Encounter (Signed)
Remote ICM transmission received.  Attempted call to patient regarding ICM remote transmission and left detailed message per DPR.  Advised to return call for any fluid symptoms or questions. Next ICM remote transmission scheduled 09/20/2022.    

## 2022-09-20 ENCOUNTER — Telehealth: Payer: Self-pay

## 2022-09-20 ENCOUNTER — Ambulatory Visit: Payer: Medicare Other | Attending: Internal Medicine

## 2022-09-20 DIAGNOSIS — I5032 Chronic diastolic (congestive) heart failure: Secondary | ICD-10-CM

## 2022-09-20 DIAGNOSIS — Z950 Presence of cardiac pacemaker: Secondary | ICD-10-CM

## 2022-09-20 NOTE — Telephone Encounter (Signed)
Remote ICM transmission received.  Attempted call to patient regarding ICM remote transmission and left detailed message per DPR.  Advised to return call for any fluid symptoms or questions. Next ICM remote transmission scheduled 09/27/2022.    

## 2022-09-20 NOTE — Progress Notes (Signed)
EPIC Encounter for ICM Monitoring  Patient Name: Jacqueline Lozano is a 88 y.o. female Date: 09/20/2022 Primary Care Physican: Jerl Mina, MD Primary Cardiologist: Mariah Milling Electrophysiologist: Joycelyn Schmid Pacing:  99.7%       09/21/2021 Office Weight: 182 lbs 10/31/202 Office Weight: 177 lbs (does not weigh at home) 07/06/2022 Weight: not weighing at home     Attempted call to patient and unable to reach.  Transmission reviewed.     Chronic symptoms include swelling of feet.     Optivol thoracic impedance suggesting possible fluid accumulation continues since 3/26.  Fluid index greater than normal threshold starting 4/9.   Prescribed:  Furosemide 20 mg Take 1 tablet (20 mg total) by mouth as directed.  Take once daily with extra dose after lunch if you have leg swelling.   Potassium 10 mEq 1 tablet daily   Labs: 09/16/2021 Creatinine 1.49, BUN 25, Potassium 4.3, Sodium 136, GFR 34 A complete set of results can be found in Results Review.   Recommendations:  Unable to reach.     Follow-up plan: ICM clinic phone appointment on 09/27/2022 to recheck fluid levels.  91 day device clinic remote transmission 09/23/2022.     EP/Cardiology Office Visits:  Recall 04/27/2023 with Dr Mariah Milling.  Recall 04/06/2023 with Dr. Graciela Husbands.     Copy of ICM check sent to Dr. Graciela Husbands.   3 month ICM trend: 09/20/2022.    12-14 Month ICM trend:     Karie Soda, RN 09/20/2022 2:38 PM

## 2022-09-23 ENCOUNTER — Ambulatory Visit (INDEPENDENT_AMBULATORY_CARE_PROVIDER_SITE_OTHER): Payer: Medicare Other

## 2022-09-23 DIAGNOSIS — I442 Atrioventricular block, complete: Secondary | ICD-10-CM

## 2022-09-23 LAB — CUP PACEART REMOTE DEVICE CHECK
Battery Remaining Longevity: 90 mo
Battery Voltage: 2.98 V
Brady Statistic AP VP Percent: 0 %
Brady Statistic AP VS Percent: 0 %
Brady Statistic AS VP Percent: 99.81 %
Brady Statistic AS VS Percent: 0.19 %
Brady Statistic RA Percent Paced: 0 %
Brady Statistic RV Percent Paced: 99.81 %
Date Time Interrogation Session: 20240418043409
Implantable Lead Connection Status: 753985
Implantable Lead Connection Status: 753985
Implantable Lead Implant Date: 20120106
Implantable Lead Implant Date: 20120106
Implantable Lead Location: 753858
Implantable Lead Location: 753860
Implantable Lead Model: 5076
Implantable Pulse Generator Implant Date: 20200717
Lead Channel Impedance Value: 1292 Ohm
Lead Channel Impedance Value: 3306 Ohm
Lead Channel Impedance Value: 3306 Ohm
Lead Channel Impedance Value: 361 Ohm
Lead Channel Impedance Value: 418 Ohm
Lead Channel Impedance Value: 608 Ohm
Lead Channel Impedance Value: 684 Ohm
Lead Channel Impedance Value: 817 Ohm
Lead Channel Impedance Value: 836 Ohm
Lead Channel Pacing Threshold Amplitude: 0.75 V
Lead Channel Pacing Threshold Pulse Width: 0.4 ms
Lead Channel Setting Pacing Amplitude: 2.5 V
Lead Channel Setting Pacing Pulse Width: 0.4 ms
Lead Channel Setting Sensing Sensitivity: 2.8 mV
Zone Setting Status: 755011

## 2022-09-27 ENCOUNTER — Ambulatory Visit: Payer: Medicare Other | Attending: Internal Medicine

## 2022-09-27 DIAGNOSIS — Z950 Presence of cardiac pacemaker: Secondary | ICD-10-CM

## 2022-09-27 DIAGNOSIS — I5032 Chronic diastolic (congestive) heart failure: Secondary | ICD-10-CM

## 2022-09-27 NOTE — Progress Notes (Signed)
EPIC Encounter for ICM Monitoring  Patient Name: Jacqueline Lozano is a 88 y.o. female Date: 09/27/2022 Primary Care Physican: Jerl Mina, MD Primary Cardiologist: Mariah Milling Electrophysiologist: Joycelyn Schmid Pacing:  99.7%       09/21/2021 Office Weight: 182 lbs 10/31/202 Office Weight: 177 lbs (does not weigh at home) 07/06/2022 Weight: not weighing at home     Transmission reviewed.     Chronic symptoms include swelling of feet.     Optivol thoracic impedance suggesting fluid levels returned to normal.  Fluid index greater than normal threshold starting 4/9.   Prescribed:  Furosemide 20 mg Take 1 tablet (20 mg total) by mouth as directed.  Take once daily with extra dose after lunch if you have leg swelling.   Potassium 10 mEq 1 tablet daily   Labs: 09/16/2021 Creatinine 1.49, BUN 25, Potassium 4.3, Sodium 136, GFR 34 A complete set of results can be found in Results Review.   Recommendations:  No changes   Follow-up plan: ICM clinic phone appointment on 10/18/2022.  91 day device clinic remote transmission 12/23/2022.     EP/Cardiology Office Visits:  Recall 04/27/2023 with Dr Mariah Milling.  Recall 04/06/2023 with Dr. Graciela Husbands.     Copy of ICM check sent to Dr. Graciela Husbands.    3 month ICM trend: 09/27/2022.    12-14 Month ICM trend:     Karie Soda, RN 09/27/2022 8:19 AM

## 2022-10-03 ENCOUNTER — Other Ambulatory Visit: Payer: Self-pay | Admitting: Cardiovascular Disease

## 2022-10-25 ENCOUNTER — Ambulatory Visit: Payer: Medicare Other | Attending: Internal Medicine

## 2022-10-25 DIAGNOSIS — Z950 Presence of cardiac pacemaker: Secondary | ICD-10-CM | POA: Diagnosis not present

## 2022-10-25 DIAGNOSIS — I5032 Chronic diastolic (congestive) heart failure: Secondary | ICD-10-CM

## 2022-10-25 NOTE — Progress Notes (Signed)
Remote pacemaker transmission.   

## 2022-10-27 NOTE — Progress Notes (Signed)
EPIC Encounter for ICM Monitoring  Patient Name: Jacqueline Lozano is a 88 y.o. female Date: 10/27/2022 Primary Care Physican: Jerl Mina, MD Primary Cardiologist: Mariah Milling Electrophysiologist: Joycelyn Schmid Pacing:  99.7%       09/21/2021 Office Weight: 182 lbs 10/31/202 Office Weight: 177 lbs (does not weigh at home) 07/06/2022 Weight: not weighing at home     Spoke with patient and heart failure questions reviewed.  Transmission results reviewed.  Pt asymptomatic for fluid accumulation.      Chronic symptoms include swelling of feet.     Optivol thoracic impedance suggesting intermittent days with possible fluid accumulation within the last month.     Prescribed:  Furosemide 20 mg Take 1 tablet (20 mg total) by mouth as directed.  Take once daily with extra dose after lunch if you have leg swelling.   Potassium 10 mEq 1 tablet daily   Labs: 09/16/2021 Creatinine 1.49, BUN 25, Potassium 4.3, Sodium 136, GFR 34 A complete set of results can be found in Results Review.   Recommendations:  No changes and encouraged to call if experiencing any fluid symptoms.   Follow-up plan: ICM clinic phone appointment on 11/29/2022.  91 day device clinic remote transmission 12/23/2022.     EP/Cardiology Office Visits:  Recall 04/27/2023 with Dr Mariah Milling.  Recall 04/06/2023 with Dr. Graciela Husbands.     Copy of ICM check sent to Dr. Graciela Husbands.    3 month ICM trend: 10/25/2022.    12-14 Month ICM trend:     Karie Soda, RN 10/27/2022 2:03 PM

## 2022-11-02 ENCOUNTER — Other Ambulatory Visit: Payer: Self-pay | Admitting: Cardiovascular Disease

## 2022-11-29 ENCOUNTER — Ambulatory Visit: Payer: Medicare Other | Attending: Internal Medicine

## 2022-11-29 DIAGNOSIS — I5032 Chronic diastolic (congestive) heart failure: Secondary | ICD-10-CM

## 2022-11-29 DIAGNOSIS — Z950 Presence of cardiac pacemaker: Secondary | ICD-10-CM | POA: Diagnosis not present

## 2022-12-01 ENCOUNTER — Telehealth: Payer: Self-pay

## 2022-12-01 NOTE — Telephone Encounter (Signed)
Remote ICM transmission received.  Attempted call to patient regarding ICM remote transmission and no answer.  

## 2022-12-01 NOTE — Progress Notes (Signed)
EPIC Encounter for ICM Monitoring  Patient Name: Jacqueline Lozano is a 88 y.o. female Date: 12/01/2022 Primary Care Physican: Jerl Mina, MD Primary Cardiologist: Mariah Milling Electrophysiologist: Joycelyn Schmid Pacing:  99.8%       09/21/2021 Office Weight: 182 lbs 10/31/202 Office Weight: 177 lbs (does not weigh at home) 07/06/2022 Weight: not weighing at home     Attempted call to patient and unable to reach.   Transmission reviewed.    Chronic symptoms include swelling of feet.     Optivol thoracic impedance suggesting normal fluid levels within the last month.     Prescribed:  Furosemide 20 mg Take 1 tablet (20 mg total) by mouth as directed.  Take once daily with extra dose after lunch if you have leg swelling.   Potassium 10 mEq 1 tablet daily   Labs: 09/16/2021 Creatinine 1.49, BUN 25, Potassium 4.3, Sodium 136, GFR 34 A complete set of results can be found in Results Review.   Recommendations:  Unable to reach.     Follow-up plan: ICM clinic phone appointment on 01/03/2023.  91 day device clinic remote transmission 12/23/2022.     EP/Cardiology Office Visits:  Recall 04/27/2023 with Dr Mariah Milling.  Recall 04/06/2023 with Dr. Graciela Husbands.     Copy of ICM check sent to Dr. Graciela Husbands.     3 month ICM trend: 11/30/2022.    12-14 Month ICM trend:     Karie Soda, RN 12/01/2022 3:22 PM

## 2022-12-07 ENCOUNTER — Other Ambulatory Visit: Payer: Self-pay | Admitting: Cardiovascular Disease

## 2022-12-07 NOTE — Telephone Encounter (Signed)
Refill Request.  

## 2022-12-07 NOTE — Telephone Encounter (Signed)
Pt is scheduled on 11/22

## 2022-12-23 ENCOUNTER — Ambulatory Visit (INDEPENDENT_AMBULATORY_CARE_PROVIDER_SITE_OTHER): Payer: Medicare Other

## 2022-12-23 DIAGNOSIS — I442 Atrioventricular block, complete: Secondary | ICD-10-CM | POA: Diagnosis not present

## 2022-12-23 LAB — CUP PACEART REMOTE DEVICE CHECK
Battery Remaining Longevity: 86 mo
Battery Voltage: 2.98 V
Brady Statistic AP VP Percent: 0 %
Brady Statistic AP VS Percent: 0 %
Brady Statistic AS VP Percent: 99.87 %
Brady Statistic AS VS Percent: 0.13 %
Brady Statistic RA Percent Paced: 0 %
Brady Statistic RV Percent Paced: 99.87 %
Date Time Interrogation Session: 20240718002155
Implantable Lead Connection Status: 753985
Implantable Lead Connection Status: 753985
Implantable Lead Implant Date: 20120106
Implantable Lead Implant Date: 20120106
Implantable Lead Location: 753858
Implantable Lead Location: 753860
Implantable Lead Model: 5076
Implantable Pulse Generator Implant Date: 20200717
Lead Channel Impedance Value: 1083 Ohm
Lead Channel Impedance Value: 3306 Ohm
Lead Channel Impedance Value: 3306 Ohm
Lead Channel Impedance Value: 342 Ohm
Lead Channel Impedance Value: 399 Ohm
Lead Channel Impedance Value: 532 Ohm
Lead Channel Impedance Value: 627 Ohm
Lead Channel Impedance Value: 684 Ohm
Lead Channel Impedance Value: 855 Ohm
Lead Channel Pacing Threshold Amplitude: 0.75 V
Lead Channel Pacing Threshold Pulse Width: 0.4 ms
Lead Channel Setting Pacing Amplitude: 2.5 V
Lead Channel Setting Pacing Pulse Width: 0.4 ms
Lead Channel Setting Sensing Sensitivity: 2.8 mV
Zone Setting Status: 755011

## 2022-12-31 ENCOUNTER — Other Ambulatory Visit: Payer: Self-pay | Admitting: Cardiovascular Disease

## 2023-01-03 ENCOUNTER — Ambulatory Visit: Payer: Medicare Other | Attending: Internal Medicine

## 2023-01-03 DIAGNOSIS — Z950 Presence of cardiac pacemaker: Secondary | ICD-10-CM

## 2023-01-03 DIAGNOSIS — I5032 Chronic diastolic (congestive) heart failure: Secondary | ICD-10-CM

## 2023-01-03 NOTE — Progress Notes (Signed)
EPIC Encounter for ICM Monitoring  Patient Name: Jacqueline Lozano is a 88 y.o. female Date: 01/03/2023 Primary Care Physican: Jerl Mina, MD Primary Cardiologist: Mariah Milling Electrophysiologist: Joycelyn Schmid Pacing:  99.8%       09/21/2021 Office Weight: 182 lbs 10/31/202 Office Weight: 177 lbs (does not weigh at home) 07/06/2022 Weight: not weighing at home     Spoke with daughter per [facility] and heart failure questions reviewed.  Transmission results reviewed. She reports patient is doing well and feeling okay.   Chronic symptoms include swelling of feet.     Optivol thoracic impedance suggesting possible fluid accumulation starting 6/24.     Prescribed:  Furosemide 20 mg Take 1 tablet (20 mg total) by mouth as directed.  Take once daily with extra dose after lunch if you have leg swelling.   Potassium 10 mEq 1 tablet daily   Labs: 12/14/2022 Creatinine 1.6, BUN 29, Potassium 4.5, Sodium 136, GFR 31 A complete set of results can be found in Results Review.   Recommendations:  Advised to take extra Furosemide 1 tablet x 2-3 days and she verbalized understanding.    Follow-up plan: ICM clinic phone appointment on 01/10/2023 to recheck fluid levels.  91 day device clinic remote transmission 03/24/2023.     EP/Cardiology Office Visits: 04/29/2023 with Dr Mariah Milling.  Recall 04/06/2023 with Dr. Graciela Husbands.     Copy of ICM check sent to Dr. Graciela Husbands.     3 month ICM trend: 01/03/2023.    12-14 Month ICM trend:     Karie Soda, RN 01/03/2023 10:53 AM

## 2023-01-06 NOTE — Progress Notes (Signed)
Remote pacemaker transmission.   

## 2023-01-10 ENCOUNTER — Ambulatory Visit: Payer: Medicare Other | Attending: Internal Medicine

## 2023-01-10 DIAGNOSIS — I5032 Chronic diastolic (congestive) heart failure: Secondary | ICD-10-CM

## 2023-01-10 DIAGNOSIS — Z950 Presence of cardiac pacemaker: Secondary | ICD-10-CM

## 2023-01-11 ENCOUNTER — Telehealth: Payer: Self-pay

## 2023-01-11 NOTE — Telephone Encounter (Signed)
Remote ICM transmission received.  Attempted call to daughter per [facility] regarding ICM remote transmission and left detailed message per DPR.  Left ICM phone number and advised to return call for any fluid symptoms or questions. Next ICM remote transmission scheduled 02/08/2023.

## 2023-01-11 NOTE — Progress Notes (Signed)
EPIC Encounter for ICM Monitoring  Patient Name: Jacqueline Lozano is a 88 y.o. female Date: 01/11/2023 Primary Care Physican: Jerl Mina, MD Primary Cardiologist: Mariah Milling Electrophysiologist: Joycelyn Schmid Pacing:  99.8%       09/21/2021 Office Weight: 182 lbs 10/31/202 Office Weight: 177 lbs (does not weigh at home) 07/06/2022 Weight: not weighing at home     Attempted call to daughter per [facility] and unable to reach.  Left detailed message per DPR regarding transmission. Transmission reviewed.    Chronic symptoms include swelling of feet.     Optivol thoracic impedance suggesting fluid levels returned to normal after recommending to take extra Lasix x 2 days.     Prescribed:  Furosemide 20 mg Take 1 tablet (20 mg total) by mouth as directed.  Take once daily with extra dose after lunch if you have leg swelling.   Potassium 10 mEq 1 tablet daily   Labs: 12/14/2022 Creatinine 1.6, BUN 29, Potassium 4.5, Sodium 136, GFR 31 A complete set of results can be found in Results Review.   Recommendations: Left voice mail with ICM number and encouraged to call if experiencing any fluid symptoms.   Follow-up plan: ICM clinic phone appointment on 02/08/2023.  91 day device clinic remote transmission 03/24/2023.     EP/Cardiology Office Visits: 04/29/2023 with Dr Mariah Milling.  Recall 04/06/2023 with Dr. Graciela Husbands.     Copy of ICM check sent to Dr. Graciela Husbands.  3 month ICM trend: 01/10/2023.    12-14 Month ICM trend:     Karie Soda, RN 01/11/2023 3:50 PM

## 2023-02-08 ENCOUNTER — Emergency Department
Admission: EM | Admit: 2023-02-08 | Discharge: 2023-02-08 | Disposition: A | Discharge status: Home / Self Care | Payer: Medicare Other | Attending: Emergency Medicine | Admitting: Emergency Medicine

## 2023-02-08 ENCOUNTER — Other Ambulatory Visit: Payer: Self-pay

## 2023-02-08 ENCOUNTER — Encounter: Payer: Self-pay | Admitting: Emergency Medicine

## 2023-02-08 ENCOUNTER — Emergency Department: Payer: Medicare Other

## 2023-02-08 ENCOUNTER — Ambulatory Visit: Payer: Medicare Other | Attending: Internal Medicine

## 2023-02-08 DIAGNOSIS — Z5321 Procedure and treatment not carried out due to patient leaving prior to being seen by health care provider: Secondary | ICD-10-CM | POA: Insufficient documentation

## 2023-02-08 DIAGNOSIS — I5032 Chronic diastolic (congestive) heart failure: Secondary | ICD-10-CM

## 2023-02-08 DIAGNOSIS — Z950 Presence of cardiac pacemaker: Secondary | ICD-10-CM | POA: Insufficient documentation

## 2023-02-08 DIAGNOSIS — R0789 Other chest pain: Secondary | ICD-10-CM | POA: Insufficient documentation

## 2023-02-08 NOTE — ED Triage Notes (Signed)
Patient to ED via POV for chest pain- tightness. Mid sternal pain that radiates into left shoulder. Started today. Hx of pacemaker.

## 2023-02-09 ENCOUNTER — Telehealth: Payer: Self-pay | Admitting: Cardiovascular Disease

## 2023-02-09 NOTE — Telephone Encounter (Signed)
Patient's daughter called and said that yesterday, patient was having pain shoulder or arm. Took some ibuprofin and started to fill better. Later on she started having some chest was tightness and ended up taking patient to [facility]. They ended up doing some test like CT and EKG and other tests. They want to know if Dr. Mariah Milling could give them the results of the tests.

## 2023-02-11 NOTE — Progress Notes (Signed)
EPIC Encounter for ICM Monitoring  Patient Name: Jacqueline Lozano is a 88 y.o. female Date: 02/11/2023 Primary Care Physican: Jerl Mina, MD Primary Cardiologist: Mariah Milling Electrophysiologist: Joycelyn Schmid Pacing:  99.8%       09/21/2021 Office Weight: 182 lbs 10/31/202 Office Weight: 177 lbs (does not weigh at home) 07/06/2022 Weight: not weighing at home     Transmission reviewed.    Chronic symptoms include swelling of feet.     Optivol thoracic impedance suggesting intermittent days with possible mild fluid accumulation since 7/28.       Prescribed:  Furosemide 20 mg Take 1 tablet (20 mg total) by mouth as directed.  Take once daily with extra dose after lunch if you have leg swelling.   Potassium 10 mEq 1 tablet daily   Labs: 12/14/2022 Creatinine 1.6, BUN 29, Potassium 4.5, Sodium 136, GFR 31 A complete set of results can be found in Results Review.   Recommendations: No changes.    Follow-up plan: ICM clinic phone appointment on 03/14/2023.  91 day device clinic remote transmission 03/24/2023.     EP/Cardiology Office Visits: 04/29/2023 with Dr Mariah Milling.  Recall 04/06/2023 with Dr. Graciela Husbands.     Copy of ICM check sent to Dr. Graciela Husbands.  3 month ICM trend: 02/08/2023.    12-14 Month ICM trend:     Karie Soda, RN 02/11/2023 2:38 PM

## 2023-02-13 ENCOUNTER — Other Ambulatory Visit: Payer: Self-pay | Admitting: Cardiovascular Disease

## 2023-02-13 DIAGNOSIS — I4821 Permanent atrial fibrillation: Secondary | ICD-10-CM

## 2023-02-14 ENCOUNTER — Telehealth: Payer: Self-pay | Admitting: Cardiovascular Disease

## 2023-02-14 NOTE — Telephone Encounter (Signed)
Prescription refill request for Eliquis received. Indication: Afib  Last office visit: 06/21/22 Mariah Milling)  Scr: 1.6 (12/14/22)  Age: 88 Weight: 81.6kg   Appropriate dose. Refill sent.

## 2023-02-14 NOTE — Telephone Encounter (Signed)
Called and spoke with daughter per DPR. Informed daughter of the following from Dr. Mariah Milling.  Chest x-ray and EKG reviewed No acute findings noted If symptoms have persisted, would recommend office visit Thx TG   Daughter verbalizes understanding. Daughter requesting appointment to be rescheduled to a sooner appointment. Appointment changed to 02/22/23.

## 2023-02-14 NOTE — Telephone Encounter (Signed)
Called and left a message for call back  

## 2023-02-14 NOTE — Telephone Encounter (Signed)
See other telephone encounter.

## 2023-02-14 NOTE — Telephone Encounter (Signed)
Patient's daughter Roanna Raider) returned RN's call.

## 2023-02-21 NOTE — Progress Notes (Unsigned)
Date:  02/22/2023   ID:  Jacqueline Lozano, DOB 01-12-1935, MRN [id number]  Patient Location:  [address] [place] [address]   Provider location:   [facility], [place] office  PCP:  Jerl Mina, MD  Cardiologist:  Hubbard Robinson [facility]  Chief Complaint  Patient presents with   6 month follow up     Patient c/o shortness of breath, upper left shoulder pain & chest pain. Medications reviewed by the patient verbally.     History of Present Illness:    Jacqueline Lozano is a 88 y.o. female past medical history of morbid obesity, recent weight loss Chronic bilateral knee pain who has declined joint surgery Permanent atrial fibrillation,  status post arteriovenous node ablation, Pacer/CRT-P,  ventricularly paced Hypertension,  Hypothyroidism,  Diastolic congestive heart failure,  C. difficile infection, chronic diarrhea Echo 07/2014: mild AS Carotid: plaque on the right in 2014 Prior PFT's shows restrictive lung disease likely from her obseity Chronic shortness of breath multifactorial including AV node ablation, paced rhythm, old obesity, deconditioning, restrictive lung disease, aortic valve stenosis who presents for followup of her chronic shortness of breath, pacemaker, atrial fibrillation   LOV with myself January 2024 Lives alone, daughter visits ("has not driven in a while" but still has her keys and a car)  Periodic left shoulder pain, "called it chest pain" per the daughter Chronic SOB Went to the ER for "chest pain", cxray and labs normal, left before being seen  Chronic H/A, on Fioricet as needed Daughter details "Mind showing her age", called daughter today at 77 Am to come pick her up, she was ready, had already eaten breakfast and was dressed  Poor diet, Eats coffee and donuts for breakfast, Does not cook, Off the shelf items, no heating of food items No lunch Snacks in bedroom Does not answer door on doorbell rings   Labs  reviewed CR 1.6, BUN 29 A1C 5.9 Total chol 214, LDL 143  EKG personally reviewed by myself on todays visit EKG Interpretation Date/Time:  Tuesday February 22 2023 11:30:50 EDT Ventricular Rate:  70 PR Interval:    QRS Duration:  154 QT Interval:  446 QTC Calculation: 481 R Axis:   -60  Text Interpretation: Ventricular-paced rhythm When compared with ECG of 08-Feb-2023 15:48, No significant change was found Confirmed by Julien Nordmann [phone]) on 02/22/2023 11:37:15 AM    chronic shortness of breath, Echocardiogram with normal LV function, no indication of elevated right heart pressures Permanent atrial fibrillation ventricularly paced extensive work-up, seen pulmonary Has had cardiac work-up, unrevealing, normal ejection fraction on echo gait instability  Medication intolerances Tried farxiga, "felt sick" Statin intolerance  Stress test 03/2017: normal study  Previous echocardiogram 2016 showed mild to moderate aortic valve stenosis, normal EF, normal right heart pressures   echocardiogram shows normal LV function, stable valve disease, normal right ventricular systolic pressures.   Previous visit to the emergency room for atypical chest pain. Cardiac enzymes were negative and she was discharged home Prior workup with echocardiogram in 2014 and stress test were unrevealing.    sleep study in October and again in November. Sleep study in October suggested she repeat the test as it was incomplete. The note indicates the study was discontinued as the patient had heart block, with limited EKG monitoring appearing to demonstrate a 43 second period of complete heart block with ventricular standstill. Atrial rate was 80 beats per minute. Note indicates the patient was awake and alert  during the event with some dizziness. Pacer was checked after this event,  Normal functioning device.   Followup sleep study 04/24/2013 recommended weight loss, though there was no indications for CPAP  at this time. Previous stress test  early 2014 showed no ischemia    Past Medical History:  Diagnosis Date   Abnormal echocardiogram 09/2009   EF >55%, mild LVH, moderate LAE, normal RV, normal PA systolic pressure   Allergic to IV contrast    Chronic diastolic CHF (congestive heart failure) (HCC)    Complete heart block-S./P. AV junction ablation 11/23/2010   Edentulous    Top.  Has most of bottom teeth   GERD (gastroesophageal reflux disease)    Hyperlipidemia    Has been unable to take statins due to muscle pain. Has tried multiple statins per her report.   Hypertension    Hypothyroidism    Migraine    none recently   OSA (obstructive sleep apnea)    Not consistently using CPAP   Pacemaker-Medtronic CRT P. 11/23/2010   Atrial port was plugged [facility] 12/22/18)   PAD (peripheral artery disease) (HCC)    Occlusive disease involving left PT and AT   Permanent atrial fibrillation (HCC)    Uses wheelchair    Able to stand and self transfer.   Past Surgical History:  Procedure Laterality Date   APPENDECTOMY     ATRIAL ABLATION SURGERY  06/12/2010   BIV PACEMAKER GENERATOR CHANGEOUT N/A 12/22/2018   Procedure: BIV PACEMAKER GENERATOR CHANGEOUT;  Surgeon: Duke Salvia, MD;  Location: [facility] INVASIVE CV LAB;  Service: Cardiovascular;  Laterality: N/A;   CARDIAC CATHETERIZATION     Left heart cath x3 in teh past, last 5 yrs ago. All "normal" per pt report   CARDIOVERSION     CARPAL TUNNEL RELEASE     CATARACT EXTRACTION W/PHACO Right 10/29/2019   Procedure: CATARACT EXTRACTION PHACO AND INTRAOCULAR LENS PLACEMENT (IOC) RIGHT 3.87 00:41.2;  Surgeon: Nevada Crane, MD;  Location: [facility] SURGERY CNTR;  Service: Ophthalmology;  Laterality: Right;  sleep apnea   CATARACT EXTRACTION W/PHACO Left 12/03/2019   Procedure: CATARACT EXTRACTION PHACO AND INTRAOCULAR LENS PLACEMENT (IOC) LEFT 6.02  00:51.3;  Surgeon: Nevada Crane, MD;  Location: [facility] SURGERY CNTR;  Service:  Ophthalmology;  Laterality: Left;  sleep apnea   CHOLECYSTECTOMY OPEN     COLONOSCOPY WITH PROPOFOL N/A 09/08/2015   Procedure: COLONOSCOPY WITH PROPOFOL;  Surgeon: Elnita Maxwell, MD;  Location: [facility] ENDOSCOPY;  Service: Endoscopy;  Laterality: N/A;   ESOPHAGOGASTRODUODENOSCOPY (EGD) WITH PROPOFOL N/A 09/08/2015   Procedure: ESOPHAGOGASTRODUODENOSCOPY (EGD) WITH PROPOFOL;  Surgeon: Elnita Maxwell, MD;  Location: [facility] ENDOSCOPY;  Service: Endoscopy;  Laterality: N/A;   PACEMAKER INSERTION  06/12/2010   VESICOVAGINAL FISTULA CLOSURE W/ TAH       Current Meds  Medication Sig   acetaminophen (TYLENOL) 325 MG tablet Take 325 mg by mouth as needed for headache.   apixaban (ELIQUIS) 2.5 MG TABS tablet Take 1 tablet by mouth twice daily   ASPIRIN 81 PO Take 1 tablet by mouth as needed. For headache   bismuth subsalicylate (PEPTO BISMOL) 262 MG chewable tablet Chew 262 mg by mouth as needed.   cholecalciferol (VITAMIN D3) 25 MCG (1000 UNIT) tablet Take 1,000 Units by mouth daily.   cyanocobalamin (,VITAMIN B-12,) 1000 MCG/ML injection Inject 1,000 mcg into the muscle every 30 (thirty) days.   dimenhyDRINATE (DRAMAMINE PO) Take 1 tablet by mouth as needed.   diphenoxylate-atropine (LOMOTIL) 2.5-0.025 MG  tablet Take 1 tablet by mouth as needed for diarrhea or loose stools.   furosemide (LASIX) 20 MG tablet TAKE 1 TABLET BY MOUTH ONCE DAILY AS DIRECTED -  TAKE  A  SECOND  TAB  AFTER  LUNCH  IF  YOU  HAVE  LEG  SWELLING   ibuprofen (ADVIL) 200 MG tablet Take 200 mg by mouth every 6 (six) hours as needed for headache or mild pain.   levothyroxine (SYNTHROID) 50 MCG tablet Take 50 mcg by mouth daily before breakfast.   MAGNESIUM GLYCINATE PO Take 1 tablet by mouth daily.   metoprolol tartrate (LOPRESSOR) 50 MG tablet TAKE 1 & 1/2 (ONE & ONE-HALF) TABLETS BY MOUTH TWICE DAILY   nitroGLYCERIN (NITROSTAT) 0.4 MG SL tablet Place 1 tablet (0.4 mg total) under the tongue every 5 (five) minutes as needed  for chest pain.   omeprazole (PRILOSEC) 20 MG capsule Take 20 mg by mouth daily.   potassium chloride (KLOR-CON M) 10 MEQ tablet Take 1 tablet (10 mEq total) by mouth daily. Please call office to schedule follow up prior to further refills.   prochlorperazine (COMPAZINE) 5 MG tablet Take 1 tablet (5 mg total) by mouth every 6 (six) hours as needed.   Vitamin D, Ergocalciferol, (DRISDOL) 1.25 MG (50000 UNIT) CAPS capsule Take 50,000 Units by mouth once a week.     Allergies:   Codeine, Farxiga [dapagliflozin], Iodinated contrast media, Morphine, Other, Oxycodone, Shellfish-derived products, Dipyridamole, Iodine, Omeprazole, Oxycodone-acetaminophen, Statins, Sulfa antibiotics, Sulfonamide derivatives, and Amlodipine   Social History   Tobacco Use   Smoking status: Never   Smokeless tobacco: Never   Tobacco comments:    pos smoke exposure through father and spouse who smoked in the home with her for many years  Vaping Use   Vaping status: Never Used  Substance Use Topics   Alcohol use: No   Drug use: No     Family Hx: The patient's family history includes Colon cancer in her daughter; Heart attack in her father and mother. There is no history of Stroke.  ROS:   Please see the history of present illness.    Review of Systems  Constitutional: Negative.   HENT: Negative.    Respiratory:  Positive for shortness of breath.   Cardiovascular:  Positive for chest pain.  Gastrointestinal: Negative.   Musculoskeletal: Negative.   Neurological: Negative.   Psychiatric/Behavioral: Negative.    All other systems reviewed and are negative.   Labs/Other Tests and Data Reviewed:    Recent Labs: No results found for requested labs within last 365 days.   Recent Lipid Panel Lab Results  Component Value Date/Time   CHOL 294 (H) 09/30/2009 09:19 PM   TRIG 459 (H) 09/30/2009 09:19 PM   HDL 44 09/30/2009 09:19 PM   CHOLHDL 6.7 Ratio 09/30/2009 09:19 PM   LDLCALC See Comment mg/dL 95/28/4132  44:01 PM   LDLDIRECT 146 (H) 10/01/2009 08:51 AM    Wt Readings from Last 3 Encounters:  02/22/23 176 lb (79.8 kg)  02/08/23 180 lb (81.6 kg)  06/21/22 177 lb (80.3 kg)     Exam:     BP 138/80 (BP Location: Left Arm, Patient Position: Sitting, Cuff Size: Normal)   Pulse 70   Ht 5' (1.524 m)   Wt 176 lb (79.8 kg)   SpO2 98%   BMI 34.37 kg/m   Constitutional:  oriented to person, place, and time. No distress.  HENT:  Head: Grossly normal Eyes:  no discharge. No  scleral icterus.  Neck: No JVD, no carotid bruits  Cardiovascular: Regular rate and rhythm, no murmurs appreciated Pulmonary/Chest: Clear to auscultation bilaterally, no wheezes or rails Abdominal: Soft.  no distension.  no tenderness.  Musculoskeletal: Normal range of motion Neurological:  normal muscle tone. Coordination normal. No atrophy Skin: Skin warm and dry Psychiatric: normal affect, pleasant   ASSESSMENT & PLAN:    Chest pain Atypical chest wall pain extending into left shoulder Daughter reports it is atypical Recommended hot packs, if symptoms persist may need evaluation of left shoulder.  Already established with orthopedics Offered lexiscan myoview, daughter declined  Atrial fibrillation, unspecified type (HCC) - Plan: EKG 12-Lead On anticoagulation, Prior AV node ablation, ventricularly paced Likely contributing to chronic shortness of breath  Shortness of breath Chronic longstanding issue Significant prior workup including Echocardiogram and stress test , no ischemia,  normal EF Previously evaluated by pulmonary Appears euvolemic Recommended regular walking program.  Activity is limited secondary to gait instability   Pacemaker-Medtronic CRT P. Managed by Dr. Graciela Husbands  Chronic diastolic CHF (congestive heart failure) (HCC)  euvolemic, no change in medication   Complete heart block-S./P. AV junction ablation pacemaker , ventricularly paced  Hyperlipidemia Previously declined  statin Previously recommended statins and Zetia   Total encounter time more than 30 minutes  Greater than 50% was spent in counseling and coordination of care with the patient   Signed, Julien Nordmann, MD  02/22/2023 11:37 AM     [facility] [address], [place], [place] [address]

## 2023-02-22 ENCOUNTER — Encounter: Payer: Self-pay | Admitting: Cardiovascular Disease

## 2023-02-22 ENCOUNTER — Ambulatory Visit: Payer: Medicare Other | Attending: Cardiovascular Disease | Admitting: Cardiovascular Disease

## 2023-02-22 VITALS — BP 138/80 | HR 70 | Ht 60.0 in | Wt 176.0 lb

## 2023-02-22 DIAGNOSIS — I4821 Permanent atrial fibrillation: Secondary | ICD-10-CM | POA: Insufficient documentation

## 2023-02-22 DIAGNOSIS — I5032 Chronic diastolic (congestive) heart failure: Secondary | ICD-10-CM | POA: Insufficient documentation

## 2023-02-22 DIAGNOSIS — I442 Atrioventricular block, complete: Secondary | ICD-10-CM | POA: Insufficient documentation

## 2023-02-22 DIAGNOSIS — Z950 Presence of cardiac pacemaker: Secondary | ICD-10-CM | POA: Insufficient documentation

## 2023-02-22 DIAGNOSIS — R0602 Shortness of breath: Secondary | ICD-10-CM | POA: Diagnosis present

## 2023-02-22 MED ORDER — METOPROLOL TARTRATE 50 MG PO TABS: ORAL_TABLET | ORAL | 3 refills | Status: DC

## 2023-02-22 MED ORDER — POTASSIUM CHLORIDE CRYS ER 10 MEQ PO TBCR: 10.0000 meq | EXTENDED_RELEASE_TABLET | Freq: Every day | ORAL | 3 refills | Status: DC

## 2023-02-22 NOTE — Patient Instructions (Signed)

## 2023-03-14 ENCOUNTER — Ambulatory Visit: Payer: Medicare Other | Attending: Internal Medicine

## 2023-03-14 DIAGNOSIS — I5032 Chronic diastolic (congestive) heart failure: Secondary | ICD-10-CM

## 2023-03-14 DIAGNOSIS — Z950 Presence of cardiac pacemaker: Secondary | ICD-10-CM | POA: Diagnosis not present

## 2023-03-15 NOTE — Progress Notes (Signed)
EPIC Encounter for ICM Monitoring  Patient Name: Jacqueline Lozano is a 88 y.o. female Date: 03/15/2023 Primary Care Physican: Jerl Mina, MD Primary Cardiologist: Mariah Milling Electrophysiologist: Joycelyn Schmid Pacing:  99.8%       09/21/2021 Office Weight: 182 lbs 10/31/202 Office Weight: 177 lbs (does not weigh at home) 07/06/2022 Weight: not weighing at home     Spoke with daughter per [facility] and heart failure questions reviewed.  Transmission results reviewed.  Pt has been feeling okay and voices no complaints.     Chronic symptoms include swelling of feet.     Optivol thoracic impedance suggesting possible mild fluid accumulation starting 10/2.    Also suggesting possible fluid accumulation intermittently from 9/9-9/29   Prescribed:  Furosemide 20 mg Take 1 tablet (20 mg total) by mouth as directed.  Take once daily with extra dose after lunch if you have leg swelling.   Potassium 10 mEq 1 tablet daily   Labs: 12/14/2022 Creatinine 1.6, BUN 29, Potassium 4.5, Sodium 136, GFR 31 A complete set of results can be found in Results Review.   Recommendations:  Pt will take extra Lasix x 2 days and then return to 1 a day as prescribed.  Encouraged to have patient to limit salt and fluid.     Follow-up plan: ICM clinic phone appointment on 03/21/2023 to recheck fluid levels.  91 day device clinic remote transmission 03/24/2023.     EP/Cardiology Office Visits: 04/29/2023 with Dr Mariah Milling.  Recall 04/06/2023 with Dr. Graciela Husbands.     Copy of ICM check sent to Dr. Graciela Husbands and Dr Mariah Milling as [place] Childes.   3 month ICM trend: 03/14/2023.    12-14 Month ICM trend:     Karie Soda, RN 03/15/2023 3:20 PM

## 2023-03-21 ENCOUNTER — Ambulatory Visit: Payer: Medicare Other | Attending: Internal Medicine

## 2023-03-21 DIAGNOSIS — Z950 Presence of cardiac pacemaker: Secondary | ICD-10-CM

## 2023-03-21 DIAGNOSIS — I5032 Chronic diastolic (congestive) heart failure: Secondary | ICD-10-CM

## 2023-03-23 NOTE — Progress Notes (Signed)
EPIC Encounter for ICM Monitoring  Patient Name: Jacqueline Lozano is a 88 y.o. female Date: 03/23/2023 Primary Care Physican: Jerl Mina, MD Primary Cardiologist: Mariah Milling Electrophysiologist: Joycelyn Schmid Pacing:  99.8%       09/21/2021 Office Weight: 182 lbs 10/31/202 Office Weight: 177 lbs (does not weigh at home) 07/06/2022 Weight: not weighing at home     Spoke with daughter, Cordelia Pen, per [facility] and heart failure questions reviewed.  Transmission results reviewed.  Pt has been feeling okay and voices no complaints.     Chronic symptoms include swelling of feet.     Optivol thoracic impedance suggesting fluid levels returned to normal after taking extra Lasix.     Prescribed:  Furosemide 20 mg Take 1 tablet (20 mg total) by mouth as directed.  Take once daily with extra dose after lunch if you have leg swelling.   Potassium 10 mEq 1 tablet daily   Labs: 12/14/2022 Creatinine 1.6, BUN 29, Potassium 4.5, Sodium 136, GFR 31 A complete set of results can be found in Results Review.   Recommendations:   No changes and encouraged to call if experiencing any fluid symptoms.    Follow-up plan: ICM clinic phone appointment on 04/18/2023.  91 day device clinic remote transmission 03/24/2023.     EP/Cardiology Office Visits:  Recall 02/22/2024 with Dr Mariah Milling.  Recall 04/06/2023 with Dr. Graciela Husbands.     Copy of ICM check sent to Dr. Graciela Husbands.   3 month ICM trend: 03/21/2023.    12-14 Month ICM trend:     Karie Soda, RN 03/23/2023 9:57 AM

## 2023-03-24 ENCOUNTER — Ambulatory Visit (INDEPENDENT_AMBULATORY_CARE_PROVIDER_SITE_OTHER): Payer: Medicare Other

## 2023-03-24 DIAGNOSIS — I442 Atrioventricular block, complete: Secondary | ICD-10-CM

## 2023-03-24 DIAGNOSIS — I5032 Chronic diastolic (congestive) heart failure: Secondary | ICD-10-CM

## 2023-03-24 LAB — CUP PACEART REMOTE DEVICE CHECK
Battery Remaining Longevity: 84 mo
Battery Voltage: 2.98 V
Brady Statistic AP VP Percent: 0 %
Brady Statistic AP VS Percent: 0 %
Brady Statistic AS VP Percent: 99.94 %
Brady Statistic AS VS Percent: 0.06 %
Brady Statistic RA Percent Paced: 0 %
Brady Statistic RV Percent Paced: 99.94 %
Date Time Interrogation Session: 20241017001406
Implantable Lead Connection Status: 753985
Implantable Lead Connection Status: 753985
Implantable Lead Implant Date: 20120106
Implantable Lead Implant Date: 20120106
Implantable Lead Location: 753858
Implantable Lead Location: 753860
Implantable Lead Model: 5076
Implantable Pulse Generator Implant Date: 20200717
Lead Channel Impedance Value: 1121 Ohm
Lead Channel Impedance Value: 3306 Ohm
Lead Channel Impedance Value: 3306 Ohm
Lead Channel Impedance Value: 342 Ohm
Lead Channel Impedance Value: 418 Ohm
Lead Channel Impedance Value: 532 Ohm
Lead Channel Impedance Value: 627 Ohm
Lead Channel Impedance Value: 741 Ohm
Lead Channel Impedance Value: 760 Ohm
Lead Channel Pacing Threshold Amplitude: 0.625 V
Lead Channel Pacing Threshold Pulse Width: 0.4 ms
Lead Channel Setting Pacing Amplitude: 2.5 V
Lead Channel Setting Pacing Pulse Width: 0.4 ms
Lead Channel Setting Sensing Sensitivity: 2.8 mV
Zone Setting Status: 755011

## 2023-04-12 ENCOUNTER — Ambulatory Visit: Payer: Medicare Other | Attending: Internal Medicine | Admitting: Internal Medicine

## 2023-04-12 ENCOUNTER — Encounter: Payer: Self-pay | Admitting: Internal Medicine

## 2023-04-12 VITALS — BP 130/72 | HR 72 | Ht 60.0 in | Wt 172.8 lb

## 2023-04-12 DIAGNOSIS — I4821 Permanent atrial fibrillation: Secondary | ICD-10-CM | POA: Insufficient documentation

## 2023-04-12 DIAGNOSIS — I5032 Chronic diastolic (congestive) heart failure: Secondary | ICD-10-CM | POA: Insufficient documentation

## 2023-04-12 NOTE — Progress Notes (Unsigned)
Patient Care Team: Jerl Mina, MD as PCP - General (Family Medicine) Mariah Milling Tollie Pizza, MD as Consulting Physician (Cardiology)   HPI  Jacqueline Lozano is a 89 y.o. female seen in followup for pacemaker implantation for tachybradycardia syndrome. She has a history of permanent atrial fibrillation and status post CRT P. She is status post AV junction ablation.  LV lead has been permanently turned off because of diaphragmatic stimulation  Underwent generator replacement 7/20.   The patient denies chest pain,  nocturnal dyspnea, orthopnea or peripheral edema.  There have been no palpitations, lightheadedness or syncope.  Continues w chronic dypsnea,  Daughter increasingly aware of memory issues, still lives by herself .  DATE TEST EF   2/16    Echo   EF 65 % Mild AS Grad 11 PA pressure 29  10/18    Myoview   EF 70 % No ischemia  2/19 Echo  65-70% Mild AS grad 6 PA pressure 58  10/22 Echo  60-65%     Date Cr K Hgb  4/18 1.4  12.2   1/19 1.36  12.8  4/19 1.4  13.0  6/20 1.4  12.9  6/22 1.6 4.8 12.5  4/23 1.49 4.3 11.9  7/24 1.6 4.5      Thromboembolic risk factors ( age  -2, HTN-1, Gender-1) for a CHADSVASc Score of >=4    Chronic watery stool.   Past Medical History:  Diagnosis Date   Abnormal echocardiogram 09/2009   EF >55%, mild LVH, moderate LAE, normal RV, normal PA systolic pressure   Allergic to IV contrast    Chronic diastolic CHF (congestive heart failure) (HCC)    Complete heart block-S./P. AV junction ablation 11/23/2010   Edentulous    Top.  Has most of bottom teeth   GERD (gastroesophageal reflux disease)    Hyperlipidemia    Has been unable to take statins due to muscle pain. Has tried multiple statins per her report.   Hypertension    Hypothyroidism    Migraine    none recently   OSA (obstructive sleep apnea)    Not consistently using CPAP   Pacemaker-Medtronic CRT P. 11/23/2010   Atrial port was plugged [facility] 12/22/18)   PAD  (peripheral artery disease) (HCC)    Occlusive disease involving left PT and AT   Permanent atrial fibrillation (HCC)    Uses wheelchair    Able to stand and self transfer.    Past Surgical History:  Procedure Laterality Date   APPENDECTOMY     ATRIAL ABLATION SURGERY  06/12/2010   BIV PACEMAKER GENERATOR CHANGEOUT N/A 12/22/2018   Procedure: BIV PACEMAKER GENERATOR CHANGEOUT;  Surgeon: Duke Salvia, MD;  Location: [facility] INVASIVE CV LAB;  Service: Cardiovascular;  Laterality: N/A;   CARDIAC CATHETERIZATION     Left heart cath x3 in teh past, last 5 yrs ago. All "normal" per pt report   CARDIOVERSION     CARPAL TUNNEL RELEASE     CATARACT EXTRACTION W/PHACO Right 10/29/2019   Procedure: CATARACT EXTRACTION PHACO AND INTRAOCULAR LENS PLACEMENT (IOC) RIGHT 3.87 00:41.2;  Surgeon: Nevada Crane, MD;  Location: [facility] SURGERY CNTR;  Service: Ophthalmology;  Laterality: Right;  sleep apnea   CATARACT EXTRACTION W/PHACO Left 12/03/2019   Procedure: CATARACT EXTRACTION PHACO AND INTRAOCULAR LENS PLACEMENT (IOC) LEFT 6.02  00:51.3;  Surgeon: Nevada Crane, MD;  Location: [facility] SURGERY CNTR;  Service: Ophthalmology;  Laterality: Left;  sleep apnea   CHOLECYSTECTOMY OPEN  COLONOSCOPY WITH PROPOFOL N/A 09/08/2015   Procedure: COLONOSCOPY WITH PROPOFOL;  Surgeon: Elnita Maxwell, MD;  Location: [facility] ENDOSCOPY;  Service: Endoscopy;  Laterality: N/A;   ESOPHAGOGASTRODUODENOSCOPY (EGD) WITH PROPOFOL N/A 09/08/2015   Procedure: ESOPHAGOGASTRODUODENOSCOPY (EGD) WITH PROPOFOL;  Surgeon: Elnita Maxwell, MD;  Location: [facility] ENDOSCOPY;  Service: Endoscopy;  Laterality: N/A;   PACEMAKER INSERTION  06/12/2010   VESICOVAGINAL FISTULA CLOSURE W/ TAH      Current Outpatient Medications  Medication Sig Dispense Refill   acetaminophen (TYLENOL) 325 MG tablet Take 325 mg by mouth as needed for headache.     Alum Hydroxide-Mag Carbonate (GAVISCON PO) Take by mouth as needed.     apixaban (ELIQUIS)  2.5 MG TABS tablet Take 1 tablet by mouth twice daily 180 tablet 1   ASPIRIN 81 PO Take 1 tablet by mouth as needed. For headache     bismuth subsalicylate (PEPTO BISMOL) 262 MG chewable tablet Chew 262 mg by mouth as needed.     cholecalciferol (VITAMIN D3) 25 MCG (1000 UNIT) tablet Take 1,000 Units by mouth daily.     cyanocobalamin (,VITAMIN B-12,) 1000 MCG/ML injection Inject 1,000 mcg into the muscle every 30 (thirty) days.     dimenhyDRINATE (DRAMAMINE PO) Take 1 tablet by mouth as needed.     diphenoxylate-atropine (LOMOTIL) 2.5-0.025 MG tablet Take 1 tablet by mouth as needed for diarrhea or loose stools.     furosemide (LASIX) 20 MG tablet TAKE 1 TABLET BY MOUTH ONCE DAILY AS DIRECTED -  TAKE  A  SECOND  TAB  AFTER  LUNCH  IF  YOU  HAVE  LEG  SWELLING 180 tablet 2   ibuprofen (ADVIL) 200 MG tablet Take 200 mg by mouth every 6 (six) hours as needed for headache or mild pain.     levothyroxine (SYNTHROID) 50 MCG tablet Take 50 mcg by mouth daily before breakfast.     levothyroxine (SYNTHROID) 75 MCG tablet Take 75 mcg by mouth daily before breakfast.     MAGNESIUM GLYCINATE PO Take 1 tablet by mouth daily.     metoprolol tartrate (LOPRESSOR) 50 MG tablet TAKE 1 & 1/2 (ONE & ONE-HALF) TABLETS BY MOUTH TWICE DAILY 270 tablet 3   nitroGLYCERIN (NITROSTAT) 0.4 MG SL tablet Place 1 tablet (0.4 mg total) under the tongue every 5 (five) minutes as needed for chest pain. 25 tablet 3   omeprazole (PRILOSEC) 20 MG capsule Take 20 mg by mouth daily.     ondansetron (ZOFRAN-ODT) 4 MG disintegrating tablet Take 1 tablet (4 mg total) by mouth every 8 (eight) hours as needed for nausea or vomiting. 20 tablet 0   potassium chloride (KLOR-CON M) 10 MEQ tablet Take 1 tablet (10 mEq total) by mouth daily. 90 tablet 3   prochlorperazine (COMPAZINE) 5 MG tablet Take 1 tablet (5 mg total) by mouth every 6 (six) hours as needed. 90 tablet 5   traMADol (ULTRAM) 50 MG tablet Take 1 tablet (50 mg total) by mouth  every 6 (six) hours as needed. 20 tablet 0   Vitamin D, Ergocalciferol, (DRISDOL) 1.25 MG (50000 UNIT) CAPS capsule Take 50,000 Units by mouth once a week.     No current facility-administered medications for this visit.    Allergies  Allergen Reactions   Codeine Shortness Of Breath and Other (See Comments)   Farxiga [Dapagliflozin] Other (See Comments)    "Seizure like activity"   Iodinated Contrast Media Shortness Of Breath and Other (See Comments)   Morphine Shortness  Of Breath and Other (See Comments)   Other Shortness Of Breath, Nausea And Vomiting and Other (See Comments)    Contrast dye   Oxycodone Shortness Of Breath   Shellfish-Derived Products Shortness Of Breath, Nausea And Vomiting and Rash   Dipyridamole Other (See Comments)    Unknown   Iodine Other (See Comments)   Omeprazole Other (See Comments)   Oxycodone-Acetaminophen Other (See Comments)   Statins Other (See Comments)    Unknown   Sulfa Antibiotics Other (See Comments)    Other reaction(s): Unknown   Sulfonamide Derivatives Other (See Comments)    Unknown   Amlodipine Rash and Other (See Comments)    Review of Systems negative except from HPI and PMH  Physical Exam BP 130/72 (BP Location: Left Arm, Patient Position: Sitting, Cuff Size: Normal)   Pulse 72   Ht 5' (1.524 m)   Wt 172 lb 12.8 oz (78.4 kg)   SpO2 97%   BMI 33.75 kg/m  Well developed and obese in no acute distress HENT normal Neck supple with JVP-flat Clear Device pocket well healed; without hematoma or erythema.  There is no tethering  Regular rate and rhythm, no  murmur Abd-soft with active BS No Clubbing cyanosis  edema Skin-warm and dry A & Oriented  Grossly normal sensory and motor function  ECG atrial fibrillation and ventricular pacing at 72  Device function is normal. Programming changes none  See Paceart for details    Assessment and  Plan  HFpEF  Atrial fibrillation-permanent  Dyspnea on exertion  Pulmonary  hypertension  AV junction ablation-complete heart block  Pacemaker-CRT   LV lead inactivated secondary to diaphragmatic stimulation    Aortic stenosis-mild  Hypertension  Euvolemic.  Continue her on furosemide  Atrial fibrillation is permanent; anticoagulated with Eliquis, dosed for age and renal function  Blood pressure is well-controlled.  Will continue on the metoprolol.  Long discussion about the stresses of her daughter as a caretaker, suggested reading Being Mortal

## 2023-04-12 NOTE — Patient Instructions (Signed)
Medication Instructions:  The current medical regimen is effective;  continue present plan and medications.  *If you need a refill on your cardiac medications before your next appointment, please call your pharmacy*   Lab Work: Your provider would like for you to have following labs drawn today CBC, B12.   If you have labs (blood work) drawn today and your tests are completely normal, you will receive your results only by: MyChart Message (if you have MyChart) OR A paper copy in the mail If you have any lab test that is abnormal or we need to change your treatment, we will call you to review the results.   Follow-Up: At [facility], you and your health needs are our priority.  As part of our continuing mission to provide you with exceptional heart care, we have created designated Provider Care Teams.  These Care Teams include your primary Cardiologist (physician) and Advanced Practice Providers (APPs -  Physician Assistants and Nurse Practitioners) who all work together to provide you with the care you need, when you need it.  We recommend signing up for the patient portal called "MyChart".  Sign up information is provided on this After Visit Summary.  MyChart is used to connect with patients for Virtual Visits (Telemedicine).  Patients are able to view lab/test results, encounter notes, upcoming appointments, etc.  Non-urgent messages can be sent to your provider as well.   To learn more about what you can do with MyChart, go to [email or web address].    Your next appointment:   12 month(s)  Provider:   Sherryl Manges, MD

## 2023-04-13 LAB — CUP PACEART INCLINIC DEVICE CHECK
Date Time Interrogation Session: 20241106210632
Implantable Lead Connection Status: 753985
Implantable Lead Connection Status: 753985
Implantable Lead Implant Date: 20120106
Implantable Lead Implant Date: 20120106
Implantable Lead Location: 753858
Implantable Lead Location: 753860
Implantable Lead Model: 5076
Implantable Pulse Generator Implant Date: 20200717

## 2023-04-13 LAB — CBC
Hematocrit: 37 % (ref 34.0–46.6)
Hemoglobin: 12.2 g/dL (ref 11.1–15.9)
MCH: 31 pg (ref 26.6–33.0)
MCHC: 33 g/dL (ref 31.5–35.7)
MCV: 94 fL (ref 79–97)
Platelets: 231 10*3/uL (ref 150–450)
RBC: 3.94 x10E6/uL (ref 3.77–5.28)
RDW: 13.1 % (ref 11.7–15.4)
WBC: 7.4 10*3/uL (ref 3.4–10.8)

## 2023-04-13 LAB — VITAMIN B12: Vitamin B-12: 925 pg/mL (ref 232–1245)

## 2023-04-13 NOTE — Progress Notes (Signed)
Remote pacemaker transmission.   

## 2023-04-18 ENCOUNTER — Ambulatory Visit: Payer: Medicare Other | Attending: Internal Medicine

## 2023-04-18 ENCOUNTER — Telehealth: Payer: Self-pay | Admitting: Cardiovascular Disease

## 2023-04-18 DIAGNOSIS — I5032 Chronic diastolic (congestive) heart failure: Secondary | ICD-10-CM | POA: Diagnosis not present

## 2023-04-18 DIAGNOSIS — Z950 Presence of cardiac pacemaker: Secondary | ICD-10-CM | POA: Diagnosis not present

## 2023-04-18 NOTE — Telephone Encounter (Signed)
Not yet reviewed with recommendations by MD. Will call once we have results.

## 2023-04-18 NOTE — Telephone Encounter (Signed)
Daughter Roanna Raider) called to follow-up on patient's lab results.

## 2023-04-22 ENCOUNTER — Telehealth: Payer: Self-pay

## 2023-04-22 NOTE — Telephone Encounter (Signed)
Remote ICM transmission received.  Attempted call to daughter regarding ICM remote transmission and left detailed message per DPR.  Left ICM phone number and advised to return call for any fluid symptoms or questions. Next ICM remote transmission scheduled 06/06/2023.

## 2023-04-22 NOTE — Progress Notes (Signed)
EPIC Encounter for ICM Monitoring  Patient Name: Jacqueline Lozano is a 88 y.o. female Date: 04/22/2023 Primary Care Physican: Jerl Mina, MD Primary Cardiologist: Mariah Milling Electrophysiologist: Joycelyn Schmid Pacing:  99.9%       09/21/2021 Office Weight: 182 lbs 10/31/202 Office Weight: 177 lbs (does not weigh at home) 07/06/2022 Weight: not weighing at home     Attempted call to daughter Roanna Raider, per DPR and unable to reach.  Left detailed message per DPR regarding transmission. Transmission reviewed.    Chronic symptoms include swelling of feet.     Optivol thoracic impedance suggesting normal fluid levels within normal fluid levels.     Prescribed:  Furosemide 20 mg Take 1 tablet (20 mg total) by mouth as directed.  Take once daily with extra dose after lunch if you have leg swelling.   Potassium 10 mEq 1 tablet daily   Labs: 12/14/2022 Creatinine 1.6, BUN 29, Potassium 4.5, Sodium 136, GFR 31 A complete set of results can be found in Results Review.   Recommendations:   Left voice mail with ICM number and encouraged to call if experiencing any fluid symptoms.   Follow-up plan: ICM clinic phone appointment on 06/06/2023.  91 day device clinic remote transmission 1/16/025.    EP/Cardiology Office Visits:  Recall 02/22/2024 with Dr Mariah Milling.  Recall 04/06/2023 with Dr. Graciela Husbands.     Copy of ICM check sent to Dr. Graciela Husbands.   3 month ICM trend: 04/18/2023.    12-14 Month ICM trend:     Karie Soda, RN 04/22/2023 3:19 PM

## 2023-04-29 ENCOUNTER — Ambulatory Visit: Payer: Medicare Other | Admitting: Cardiovascular Disease

## 2023-06-06 ENCOUNTER — Ambulatory Visit: Payer: Medicare Other | Attending: Internal Medicine

## 2023-06-06 DIAGNOSIS — I5032 Chronic diastolic (congestive) heart failure: Secondary | ICD-10-CM | POA: Diagnosis not present

## 2023-06-06 DIAGNOSIS — Z950 Presence of cardiac pacemaker: Secondary | ICD-10-CM | POA: Diagnosis not present

## 2023-06-10 ENCOUNTER — Telehealth: Payer: Self-pay

## 2023-06-10 NOTE — Telephone Encounter (Signed)
 Remote ICM transmission received.  Attempted call to daughter regarding ICM remote transmission and left detailed message per DPR.  Left ICM phone number and advised to return call for any fluid symptoms or questions. Next ICM remote transmission scheduled 07/11/2023.

## 2023-06-23 ENCOUNTER — Ambulatory Visit (INDEPENDENT_AMBULATORY_CARE_PROVIDER_SITE_OTHER): Payer: Medicare Other

## 2023-06-23 DIAGNOSIS — I5032 Chronic diastolic (congestive) heart failure: Secondary | ICD-10-CM

## 2023-06-23 LAB — CUP PACEART REMOTE DEVICE CHECK
Battery Remaining Longevity: 79 mo
Battery Voltage: 2.97 V
Brady Statistic AP VP Percent: 0 %
Brady Statistic AP VS Percent: 0 %
Brady Statistic AS VP Percent: 99.76 %
Brady Statistic AS VS Percent: 0.24 %
Brady Statistic RA Percent Paced: 0 %
Brady Statistic RV Percent Paced: 99.76 %
Date Time Interrogation Session: 20250115214713
Implantable Lead Connection Status: 753985
Implantable Lead Connection Status: 753985
Implantable Lead Implant Date: 20120106
Implantable Lead Implant Date: 20120106
Implantable Lead Location: 753858
Implantable Lead Location: 753860
Implantable Lead Model: 5076
Implantable Pulse Generator Implant Date: 20200717
Lead Channel Impedance Value: 1007 Ohm
Lead Channel Impedance Value: 3306 Ohm
Lead Channel Impedance Value: 3306 Ohm
Lead Channel Impedance Value: 342 Ohm
Lead Channel Impedance Value: 399 Ohm
Lead Channel Impedance Value: 513 Ohm
Lead Channel Impedance Value: 589 Ohm
Lead Channel Impedance Value: 627 Ohm
Lead Channel Impedance Value: 722 Ohm
Lead Channel Pacing Threshold Amplitude: 0.75 V
Lead Channel Pacing Threshold Pulse Width: 0.4 ms
Lead Channel Setting Pacing Amplitude: 2.5 V
Lead Channel Setting Pacing Pulse Width: 0.4 ms
Lead Channel Setting Sensing Sensitivity: 2.8 mV
Zone Setting Status: 755011

## 2023-07-11 ENCOUNTER — Ambulatory Visit: Payer: Medicare Other | Attending: Internal Medicine

## 2023-07-11 DIAGNOSIS — Z950 Presence of cardiac pacemaker: Secondary | ICD-10-CM | POA: Diagnosis not present

## 2023-07-11 DIAGNOSIS — I5032 Chronic diastolic (congestive) heart failure: Secondary | ICD-10-CM

## 2023-08-01 NOTE — Progress Notes (Signed)
 Remote pacemaker transmission.

## 2023-08-03 ENCOUNTER — Encounter: Payer: Self-pay | Admitting: Internal Medicine

## 2023-08-07 ENCOUNTER — Other Ambulatory Visit: Payer: Self-pay | Admitting: Cardiovascular Disease

## 2023-08-07 DIAGNOSIS — I4821 Permanent atrial fibrillation: Secondary | ICD-10-CM

## 2023-08-08 NOTE — Telephone Encounter (Signed)
 Prescription refill request for Eliquis received. Indication:afib Last office visit:11/24 Scr:1.6  7/24 Age: 89 Weight:78.4  kg  Prescription refilled

## 2023-08-15 ENCOUNTER — Ambulatory Visit: Payer: Medicare Other | Attending: Internal Medicine

## 2023-08-15 DIAGNOSIS — I5032 Chronic diastolic (congestive) heart failure: Secondary | ICD-10-CM | POA: Diagnosis not present

## 2023-08-15 DIAGNOSIS — Z950 Presence of cardiac pacemaker: Secondary | ICD-10-CM

## 2023-08-16 NOTE — Progress Notes (Signed)
 EPIC Encounter for ICM Monitoring  Patient Name: Jacqueline Lozano is a 89 y.o. female Date: 08/16/2023 Primary Care Physican: Jerl Mina, MD Primary Cardiologist: Mariah Milling Electrophysiologist: Joycelyn Schmid Pacing:  99.9%       09/21/2021 Office Weight: 182 lbs 10/31/202 Office Weight: 177 lbs (does not weigh at home) 07/06/2022 Weight: not weighing at home   Monitored  VT (>4 beats)   1   Spoke with daughter and heart failure questions reviewed.  Transmission results reviewed.  Pt has chronic symptoms of SOB and leg swelling but no worse than usual.     Optivol thoracic impedance suggesting ongoing possible fluid accumulation starting 2/4. Fluid index greater than normal starting 12/10.    Difficulty maintaining normal fluid levels since 05/2023.   Prescribed:  Furosemide 20 mg Take 1 tablet (20 mg total) by mouth as directed.  Take once daily with extra dose after lunch if you have leg swelling.   Potassium 10 mEq 1 tablet daily   Labs: 12/14/2022 Creatinine 1.6, BUN 29, Potassium 4.5, Sodium 136, GFR 31 A complete set of results can be found in Results Review.   Recommendations:  Advised to take Lasix 1 tablet twice a day x 2 days and then resume 1 tablet daily.  Advised to limit salt intake as well.    Follow-up plan: ICM clinic phone appointment on 08/22/2023 to recheck fluid levels.  91 day device clinic remote transmission 09/19/2023.    EP/Cardiology Office Visits:  Recall 02/22/2024 with Dr Mariah Milling.  Recall 04/11/2024 with Dr. Graciela Husbands.     Copy of ICM check sent to Dr. Graciela Husbands.   3 month ICM trend: 08/15/2023.    12-14 Month ICM trend:     Karie Soda, RN 08/16/2023 8:10 AM

## 2023-08-22 ENCOUNTER — Encounter

## 2023-08-24 NOTE — Progress Notes (Signed)
 No ICM remote transmission received for 08/22/2023 and next ICM transmission scheduled for 09/15/2023.

## 2023-09-15 ENCOUNTER — Encounter

## 2023-09-15 NOTE — Progress Notes (Signed)
 No ICM remote transmission received for 09/12/2023 and next ICM transmission scheduled for 09/26/2023.

## 2023-09-22 ENCOUNTER — Ambulatory Visit (INDEPENDENT_AMBULATORY_CARE_PROVIDER_SITE_OTHER): Payer: Medicare Other

## 2023-09-22 DIAGNOSIS — I442 Atrioventricular block, complete: Secondary | ICD-10-CM

## 2023-09-23 ENCOUNTER — Ambulatory Visit: Attending: Internal Medicine

## 2023-09-23 DIAGNOSIS — Z950 Presence of cardiac pacemaker: Secondary | ICD-10-CM

## 2023-09-23 DIAGNOSIS — I5032 Chronic diastolic (congestive) heart failure: Secondary | ICD-10-CM

## 2023-09-23 NOTE — Progress Notes (Signed)
 EPIC Encounter for ICM Monitoring  Patient Name: Jacqueline Lozano is a 89 y.o. female Date: 09/23/2023 Primary Care Physican: Lyle San, MD Primary Cardiologist: Jerelene Monday Electrophysiologist: Victorino Grates Pacing:  99.9%       09/21/2021 Office Weight: 182 lbs 10/31/202 Office Weight: 177 lbs (does not weigh at home) 07/06/2022 Weight: not weighing at home    Transmission results reviewed.       Optivol thoracic impedance suggesting possible fluid accumulation starting 4/3 and returned to baseline 4/14 Prescribed:  Furosemide  20 mg Take 1 tablet (20 mg total) by mouth as directed.  Take once daily with extra dose after lunch if you have leg swelling.   Potassium 10 mEq 1 tablet daily   Labs: 12/14/2022 Creatinine 1.6, BUN 29, Potassium 4.5, Sodium 136, GFR 31 A complete set of results can be found in Results Review.   Recommendations:  None    Follow-up plan: ICM clinic phone appointment on 10/24/2023.  91 day device clinic remote transmission 12/22/2023.    EP/Cardiology Office Visits:  Recall 02/22/2024 with Dr [name].  Recall 04/11/2024 with Dr. Rodolfo Clan.     Copy of ICM check sent to Dr. Rodolfo Clan.   3 month ICM trend: 09/22/2023.    12-14 Month ICM trend:     [name], RN 09/23/2023 7:20 AM

## 2023-09-24 LAB — CUP PACEART REMOTE DEVICE CHECK
Battery Remaining Longevity: 76 mo
Battery Voltage: 2.97 V
Brady Statistic AP VP Percent: 0 %
Brady Statistic AP VS Percent: 0 %
Brady Statistic AS VP Percent: 99.89 %
Brady Statistic AS VS Percent: 0.11 %
Brady Statistic RA Percent Paced: 0 %
Brady Statistic RV Percent Paced: 99.89 %
Date Time Interrogation Session: 20250417045610
Implantable Lead Connection Status: 753985
Implantable Lead Connection Status: 753985
Implantable Lead Implant Date: 20120106
Implantable Lead Implant Date: 20120106
Implantable Lead Location: 753858
Implantable Lead Location: 753860
Implantable Lead Model: 5076
Implantable Pulse Generator Implant Date: 20200717
Lead Channel Impedance Value: 1083 Ohm
Lead Channel Impedance Value: 3306 Ohm
Lead Channel Impedance Value: 3306 Ohm
Lead Channel Impedance Value: 361 Ohm
Lead Channel Impedance Value: 418 Ohm
Lead Channel Impedance Value: 532 Ohm
Lead Channel Impedance Value: 646 Ohm
Lead Channel Impedance Value: 684 Ohm
Lead Channel Impedance Value: 779 Ohm
Lead Channel Pacing Threshold Amplitude: 0.75 V
Lead Channel Pacing Threshold Pulse Width: 0.4 ms
Lead Channel Setting Pacing Amplitude: 2.5 V
Lead Channel Setting Pacing Pulse Width: 0.4 ms
Lead Channel Setting Sensing Sensitivity: 2.8 mV
Zone Setting Status: 755011

## 2023-09-26 ENCOUNTER — Encounter

## 2023-09-29 ENCOUNTER — Encounter: Payer: Self-pay | Admitting: Internal Medicine

## 2023-10-24 ENCOUNTER — Ambulatory Visit: Attending: Cardiology

## 2023-10-24 DIAGNOSIS — I5032 Chronic diastolic (congestive) heart failure: Secondary | ICD-10-CM

## 2023-10-24 DIAGNOSIS — Z950 Presence of cardiac pacemaker: Secondary | ICD-10-CM

## 2023-10-26 ENCOUNTER — Telehealth: Payer: Self-pay

## 2023-10-26 NOTE — Progress Notes (Signed)
 EPIC Encounter for ICM Monitoring  Patient Name: Jacqueline Lozano is a 89 y.o. female Date: 10/26/2023 Primary Care Physican: Lyle San, MD Primary Cardiologist: Jerelene Monday Electrophysiologist: Kasandra Pain Pacing:  99.9%       09/21/2021 Office Weight: 182 lbs 10/31/202 Office Weight: 177 lbs (does not weigh at home) 07/06/2022 Weight: not weighing at home    Attempted call to daughter per [facility] and unable to reach.  Transmission results reviewed.       Optivol thoracic impedance suggesting normal fluid levels since 4/20. Prescribed:  Furosemide  20 mg Take 1 tablet (20 mg total) by mouth as directed.  Take once daily with extra dose after lunch if you have leg swelling.   Potassium 10 mEq 1 tablet daily   Labs: 12/14/2022 Creatinine 1.6, BUN 29, Potassium 4.5, Sodium 136, GFR 31 A complete set of results can be found in Results Review.   Recommendations:  Unable to reach.     Follow-up plan: ICM clinic phone appointment on 12/26/2023.  91 day device clinic remote transmission 12/22/2023.    EP/Cardiology Office Visits:  Recall 02/22/2024 with Dr [name].  Recall 04/11/2024 with Dr. Rodolfo Clan.     Copy of ICM check sent to Dr. Marven Slimmer.   3 month ICM trend: 10/24/2023.    12-14 Month ICM trend:     Jacqueline Jain, RN 10/26/2023 2:23 PM

## 2023-10-26 NOTE — Telephone Encounter (Signed)
Remote ICM transmission received.  Attempted call to daughter per [facility] regarding ICM remote transmission and no answer.

## 2023-11-02 NOTE — Progress Notes (Signed)
 Remote pacemaker transmission.

## 2023-11-02 NOTE — Addendum Note (Signed)
 Addended by: Lott Rouleau A on: 11/02/2023 10:45 AM   Modules accepted: Orders

## 2023-11-03 ENCOUNTER — Other Ambulatory Visit: Payer: Self-pay | Admitting: Cardiovascular Disease

## 2023-11-03 DIAGNOSIS — R0602 Shortness of breath: Secondary | ICD-10-CM

## 2023-11-03 DIAGNOSIS — R079 Chest pain, unspecified: Secondary | ICD-10-CM

## 2023-11-03 DIAGNOSIS — I509 Heart failure, unspecified: Secondary | ICD-10-CM

## 2023-12-22 ENCOUNTER — Ambulatory Visit: Payer: Medicare Other

## 2023-12-22 DIAGNOSIS — I442 Atrioventricular block, complete: Secondary | ICD-10-CM

## 2023-12-23 LAB — CUP PACEART REMOTE DEVICE CHECK
Battery Remaining Longevity: 71 mo
Battery Voltage: 2.97 V
Brady Statistic AP VP Percent: 0 %
Brady Statistic AP VS Percent: 0 %
Brady Statistic AS VP Percent: 99.7 %
Brady Statistic AS VS Percent: 0.3 %
Brady Statistic RA Percent Paced: 0 %
Brady Statistic RV Percent Paced: 99.7 %
Date Time Interrogation Session: 20250717040436
Implantable Lead Connection Status: 753985
Implantable Lead Connection Status: 753985
Implantable Lead Implant Date: 20120106
Implantable Lead Implant Date: 20120106
Implantable Lead Location: 753858
Implantable Lead Location: 753860
Implantable Lead Model: 5076
Implantable Pulse Generator Implant Date: 20200717
Lead Channel Impedance Value: 1102 Ohm
Lead Channel Impedance Value: 3306 Ohm
Lead Channel Impedance Value: 3306 Ohm
Lead Channel Impedance Value: 342 Ohm
Lead Channel Impedance Value: 399 Ohm
Lead Channel Impedance Value: 532 Ohm
Lead Channel Impedance Value: 627 Ohm
Lead Channel Impedance Value: 703 Ohm
Lead Channel Impedance Value: 798 Ohm
Lead Channel Pacing Threshold Amplitude: 0.75 V
Lead Channel Pacing Threshold Pulse Width: 0.4 ms
Lead Channel Setting Pacing Amplitude: 2.5 V
Lead Channel Setting Pacing Pulse Width: 0.4 ms
Lead Channel Setting Sensing Sensitivity: 2.8 mV
Zone Setting Status: 755011

## 2023-12-24 ENCOUNTER — Ambulatory Visit: Payer: Self-pay | Admitting: Cardiology

## 2023-12-26 ENCOUNTER — Ambulatory Visit: Attending: Cardiology

## 2023-12-26 DIAGNOSIS — I5032 Chronic diastolic (congestive) heart failure: Secondary | ICD-10-CM | POA: Diagnosis not present

## 2023-12-26 DIAGNOSIS — Z950 Presence of cardiac pacemaker: Secondary | ICD-10-CM | POA: Diagnosis not present

## 2023-12-27 NOTE — Progress Notes (Signed)
 EPIC Encounter for ICM Monitoring  Patient Name: Jacqueline Lozano is a 89 y.o. female Date: 12/27/2023 Primary Care Physican: Valora Agent, MD Primary Cardiologist: Perla Electrophysiologist: Cindie Pore Pacing:  99.8%       09/21/2021 Office Weight: 182 lbs 10/31/202 Office Weight: 177 lbs (does not weigh at home) 07/06/2022 Weight: not weighing at home    Attempted call to daughter per [facility] and unable to reach.  Transmission results reviewed.       Optivol thoracic impedance suggesting possible fluid accumulation starting 7/7 but trending back toward baseline. Prescribed:  Furosemide  20 mg Take 1 tablet (20 mg total) by mouth as directed.  Take once daily with extra dose after lunch if you have leg swelling.   Potassium 10 mEq 1 tablet daily   Labs: 12/14/2022 Creatinine 1.6, BUN 29, Potassium 4.5, Sodium 136, GFR 31 A complete set of results can be found in Results Review.   Recommendations:  Unable to reach.  Will send to Dr [name] for review if patient is reached.    Follow-up plan: ICM clinic phone appointment on 01/03/2024 to recheck fluid levels.  91 day device clinic remote transmission 03/22/2024.    EP/Cardiology Office Visits:  Recall 02/22/2024 with Dr [name].  Recall 04/11/2024 with Dr. Fernande.     Copy of ICM check sent to Dr. Cindie.   3 month ICM trend: 12/26/2023.    12-14 Month ICM trend:     Jacqueline GORMAN Garner, RN 12/27/2023 4:59 PM

## 2023-12-29 NOTE — Progress Notes (Signed)
 Spoke with daughter per [facility] and heart failure questions reviewed.  Transmission results reviewed.  Daughter reports patient has swelling in her feet.  Advised to take 1 extra Lasix  as prescribed x 2 days to help with leg swelling.  Will recheck fluid levels on 7/29

## 2024-01-01 ENCOUNTER — Other Ambulatory Visit: Payer: Self-pay | Admitting: Cardiovascular Disease

## 2024-01-03 ENCOUNTER — Ambulatory Visit: Attending: Cardiology

## 2024-01-03 DIAGNOSIS — Z950 Presence of cardiac pacemaker: Secondary | ICD-10-CM

## 2024-01-03 DIAGNOSIS — I5032 Chronic diastolic (congestive) heart failure: Secondary | ICD-10-CM

## 2024-01-04 NOTE — Progress Notes (Signed)
 EPIC Encounter for ICM Monitoring  Patient Name: Jacqueline Lozano is a 89 y.o. female Date: 01/04/2024 Primary Care Physican: Valora Agent, MD Primary Cardiologist: Perla Electrophysiologist: Cindie Pore Pacing:  99.8%       09/21/2021 Office Weight: 182 lbs 10/31/202 Office Weight: 177 lbs (does not weigh at home) 07/06/2022 Weight: not weighing at home  01/04/2024 Weight: Not weighing at home   Spoke with daughter per [facility].  Transmission results reviewed.  She reports patient is doing well and had good urine out put when taking extra Furosemide .  She does not weigh at home and has chronic shortness of breath.   Optivol thoracic impedance suggesting fluid levels trending back toward baseline but still suggesting possible fluid accumulation.   Prescribed:  Furosemide  20 mg Take 1 tablet (20 mg total) by mouth as directed.  Take once daily with extra dose after lunch if you have leg swelling.   Potassium 10 mEq 1 tablet daily   Labs: 12/14/2022 Creatinine 1.6, BUN 29, Potassium 4.5, Sodium 136, GFR 31 A complete set of results can be found in Results Review.   Recommendations:  Advised to take extra Furosemide  as prescribed 1 tablet x 2 days and then return to daily.     Follow-up plan: ICM clinic phone appointment on 01/30/2024.  91 day device clinic remote transmission 03/22/2024.    EP/Cardiology Office Visits:  Recall 02/22/2024 with Dr [name].  Recall 04/11/2024 with Dr. Fernande.     Copy of ICM check sent to Dr. Cindie.   3 month ICM trend: 01/04/2024.    12-14 Month ICM trend:     Jacqueline GORMAN Garner, RN 01/04/2024 12:54 PM

## 2024-01-30 ENCOUNTER — Ambulatory Visit: Attending: Cardiology

## 2024-01-30 ENCOUNTER — Other Ambulatory Visit: Payer: Self-pay | Admitting: Cardiovascular Disease

## 2024-01-30 DIAGNOSIS — Z950 Presence of cardiac pacemaker: Secondary | ICD-10-CM | POA: Diagnosis not present

## 2024-01-30 DIAGNOSIS — I5032 Chronic diastolic (congestive) heart failure: Secondary | ICD-10-CM | POA: Diagnosis not present

## 2024-01-30 DIAGNOSIS — I4821 Permanent atrial fibrillation: Secondary | ICD-10-CM

## 2024-01-30 NOTE — Telephone Encounter (Signed)
 Prescription refill request for Eliquis  received. Indication: AF Last office visit: 04/12/23  GORMAN Sage MD Scr: 1.6 on 09/27/23  Epic Age: 89 Weight: 78.4kg  Based on above findings Eliquis  2.5mg  twice daily is the appropriate dose.  Refill approved.

## 2024-02-01 NOTE — Progress Notes (Unsigned)
 EPIC Encounter for ICM Monitoring  Patient Name: Jacqueline Lozano is a 88 y.o. female Date: 02/01/2024 Primary Care Physican: Valora Agent, MD Primary Cardiologist: Perla Electrophysiologist: Cindie Pore Pacing:  99.8%       09/21/2021 Office Weight: 182 lbs 10/31/202 Office Weight: 177 lbs (does not weigh at home) 07/06/2022 Weight: not weighing at home  01/04/2024 Weight: Not weighing at home   Spoke with daughter per [facility].  Transmission results reviewed.  She reports patients feet are always swollen but no change in breathing.  She does not weigh at home.   Optivol thoracic impedance suggesting unable to maintain normal fluid levels since 5/31.    Prescribed:  Furosemide  20 mg Take 1 tablet (20 mg total) by mouth as directed.  Take once daily with extra dose after lunch if you have leg swelling.   Potassium 10 mEq 1 tablet daily   Labs: 09/27/2023 Creatinine 1.6, BUN 26, Potassium 4.5, Sodium 140, GFR 31 A complete set of results can be found in Results Review.   Recommendations:  Advised Lasix  20 mg 1 tablet twice a day x 3 days and then return to 1 tablet daily.     Follow-up plan: ICM clinic phone appointment on 01/30/2024.  91 day device clinic remote transmission 03/22/2024.    EP/Cardiology Office Visits:  Advised to call Dr Tresia office to schedule yearly appt.   Recall 02/22/2024 with Dr [name] (yearly).  Recall 04/11/2024 with Dr. Fernande.     Copy of ICM check sent to Dr. Cindie.   Copy sent to Dr [name] for review and recommendations if needed.   3 month ICM trend: 01/30/2024.    12-14 Month ICM trend:     Mitzie GORMAN Garner, RN 02/01/2024 2:19 PM

## 2024-02-02 NOTE — Progress Notes (Signed)
No recommendations received.

## 2024-02-07 ENCOUNTER — Ambulatory Visit: Attending: Cardiology

## 2024-02-07 DIAGNOSIS — I5032 Chronic diastolic (congestive) heart failure: Secondary | ICD-10-CM

## 2024-02-07 DIAGNOSIS — Z950 Presence of cardiac pacemaker: Secondary | ICD-10-CM

## 2024-02-08 ENCOUNTER — Telehealth: Payer: Self-pay

## 2024-02-08 NOTE — Progress Notes (Signed)
 EPIC Encounter for ICM Monitoring  Patient Name: Jacqueline Lozano is a 89 y.o. female Date: 02/08/2024 Primary Care Physican: Valora Agent, MD Primary Cardiologist: Perla Electrophysiologist: Cindie Pore Pacing:  99.8%       09/21/2021 Office Weight: 182 lbs 10/31/202 Office Weight: 177 lbs (does not weigh at home) 07/06/2022 Weight: not weighing at home  01/04/2024 Weight: Not weighing at home   Attempted ICM call to daughter per [facility] and unable to reach.  Transmission results reviewed.  Left detailed message per DPR regarding transmission.    Optivol thoracic impedance suggesting possible fluid accumulation starting 5/31 and returned to normal 8/26 after taking Lasix  1 tablet bid x 3 days.     Prescribed:  Furosemide  20 mg Take 1 tablet (20 mg total) by mouth as directed.  Take once daily with extra dose after lunch if you have leg swelling.   Potassium 10 mEq 1 tablet daily   Labs: 09/27/2023 Creatinine 1.6, BUN 26, Potassium 4.5, Sodium 140, GFR 31 A complete set of results can be found in Results Review.   Recommendations:  Left voice mail with ICM number and encouraged to call if experiencing any fluid symptoms.   Follow-up plan: ICM clinic phone appointment on 03/05/2024.  91 day device clinic remote transmission 03/22/2024.    EP/Cardiology Office Visits:  Advised to call Dr Tresia office to schedule yearly appt.   Recall 02/22/2024 with Dr [name] (yearly).  Recall 04/11/2024 with Dr. Fernande.     Copy of ICM check sent to Dr. Cindie.   3 month ICM trend: 02/07/2024.    12-14 Month ICM trend:     Jacqueline GORMAN Garner, RN 02/08/2024 12:24 PM

## 2024-02-08 NOTE — Telephone Encounter (Signed)
 Remote ICM transmission received.  Attempted call to daughter regarding ICM remote transmission and left detailed message per DPR.  Left ICM phone number and advised to return call for any fluid symptoms or questions. Next ICM remote transmission scheduled 03/05/2024.

## 2024-02-09 ENCOUNTER — Other Ambulatory Visit: Payer: Self-pay | Admitting: Cardiovascular Disease

## 2024-02-21 ENCOUNTER — Other Ambulatory Visit: Payer: Self-pay | Admitting: Cardiovascular Disease

## 2024-02-24 NOTE — Progress Notes (Signed)
 [name], Timothy J, MD  Elanor Cale, [name], RN It looks like those couple extra Lasix  pulled the fluid right off Repeat optivol dropped right down Wonder if she can give us  a weight on her home scale when you call her with the report? Thx TGollan

## 2024-03-05 ENCOUNTER — Ambulatory Visit: Attending: Cardiology

## 2024-03-05 DIAGNOSIS — I5032 Chronic diastolic (congestive) heart failure: Secondary | ICD-10-CM

## 2024-03-05 DIAGNOSIS — Z950 Presence of cardiac pacemaker: Secondary | ICD-10-CM

## 2024-03-08 NOTE — Progress Notes (Signed)
 EPIC Encounter for ICM Monitoring  Patient Name: Jacqueline Lozano is a 89 y.o. female Date: 03/08/2024 Primary Care Physican: Stanton Lynwood FALCON, MD Primary Cardiologist: Perla Electrophysiologist: Cindie Pore Pacing:  99.6%       09/21/2021 Office Weight: 182 lbs 10/31/202 Office Weight: 177 lbs (does not weigh at home) 07/06/2022 Weight: not weighing at home  01/04/2024 Weight: Not weighing at home 02/03/2024 Office Weight: 163 lbs   Transmission results reviewed.     Since 02/07/2024 ICM Remote Transmission: Optivol thoracic impedance suggesting normal fluid levels.     Prescribed:  Furosemide  20 mg Take 1 tablet (20 mg total) by mouth as directed.  Take once daily with extra dose after lunch if you have leg swelling.   Potassium 10 mEq 1 tablet daily   Labs: 09/27/2023 Creatinine 1.6, BUN 26, Potassium 4.5, Sodium 140, GFR 31 A complete set of results can be found in Results Review.   Recommendations:  No changes.   Follow-up plan: ICM clinic phone appointment on 04/09/2024.  91 day device clinic remote transmission 03/22/2024.    EP/Cardiology Office Visits:  04/10/2024 with Dr [name] (yearly).  Recall 04/11/2024 with Dr. Fernande.     Copy of ICM check sent to Dr. Cindie.   Remote monitoring is medically necessary for Heart Failure Management.    90 day Daily Thoracic Impedance ICM trend: 12/05/2023 through 03/05/2024.    12-14 Month Thoracic Impedance ICM trend:     Mitzie GORMAN Garner, RN 03/08/2024 8:55 AM

## 2024-03-13 NOTE — Progress Notes (Signed)
 Remote PPM Transmission

## 2024-03-18 ENCOUNTER — Emergency Department

## 2024-03-18 ENCOUNTER — Observation Stay

## 2024-03-18 ENCOUNTER — Inpatient Hospital Stay
Admission: EM | Admit: 2024-03-18 | Discharge: 2024-03-24 | DRG: 193 | Disposition: A | Discharge status: Skilled Nursing Facility | Attending: Internal Medicine | Admitting: Internal Medicine

## 2024-03-18 ENCOUNTER — Other Ambulatory Visit: Payer: Self-pay

## 2024-03-18 ENCOUNTER — Encounter: Payer: Self-pay | Admitting: Emergency Medicine

## 2024-03-18 ENCOUNTER — Observation Stay
Admit: 2024-03-18 | Discharge: 2024-03-18 | Disposition: A | Discharge status: Home / Self Care | Attending: Internal Medicine | Admitting: Internal Medicine

## 2024-03-18 DIAGNOSIS — I442 Atrioventricular block, complete: Secondary | ICD-10-CM | POA: Diagnosis present

## 2024-03-18 DIAGNOSIS — E860 Dehydration: Secondary | ICD-10-CM | POA: Diagnosis present

## 2024-03-18 DIAGNOSIS — J189 Pneumonia, unspecified organism: Principal | ICD-10-CM | POA: Diagnosis present

## 2024-03-18 DIAGNOSIS — Z950 Presence of cardiac pacemaker: Secondary | ICD-10-CM | POA: Diagnosis present

## 2024-03-18 DIAGNOSIS — I4821 Permanent atrial fibrillation: Secondary | ICD-10-CM | POA: Diagnosis present

## 2024-03-18 DIAGNOSIS — Z79899 Other long term (current) drug therapy: Secondary | ICD-10-CM

## 2024-03-18 DIAGNOSIS — N179 Acute kidney failure, unspecified: Secondary | ICD-10-CM | POA: Diagnosis present

## 2024-03-18 DIAGNOSIS — K5900 Constipation, unspecified: Secondary | ICD-10-CM | POA: Diagnosis present

## 2024-03-18 DIAGNOSIS — R0602 Shortness of breath: Secondary | ICD-10-CM

## 2024-03-18 DIAGNOSIS — G4733 Obstructive sleep apnea (adult) (pediatric): Secondary | ICD-10-CM | POA: Diagnosis present

## 2024-03-18 DIAGNOSIS — Z7901 Long term (current) use of anticoagulants: Secondary | ICD-10-CM

## 2024-03-18 DIAGNOSIS — I5031 Acute diastolic (congestive) heart failure: Secondary | ICD-10-CM | POA: Diagnosis not present

## 2024-03-18 DIAGNOSIS — E785 Hyperlipidemia, unspecified: Secondary | ICD-10-CM | POA: Diagnosis present

## 2024-03-18 DIAGNOSIS — K219 Gastro-esophageal reflux disease without esophagitis: Secondary | ICD-10-CM | POA: Diagnosis present

## 2024-03-18 DIAGNOSIS — Z8249 Family history of ischemic heart disease and other diseases of the circulatory system: Secondary | ICD-10-CM

## 2024-03-18 DIAGNOSIS — R918 Other nonspecific abnormal finding of lung field: Secondary | ICD-10-CM

## 2024-03-18 DIAGNOSIS — Z5900 Homelessness unspecified: Secondary | ICD-10-CM

## 2024-03-18 DIAGNOSIS — I3139 Other pericardial effusion (noninflammatory): Secondary | ICD-10-CM | POA: Diagnosis present

## 2024-03-18 DIAGNOSIS — Z7982 Long term (current) use of aspirin: Secondary | ICD-10-CM

## 2024-03-18 DIAGNOSIS — I130 Hypertensive heart and chronic kidney disease with heart failure and stage 1 through stage 4 chronic kidney disease, or unspecified chronic kidney disease: Secondary | ICD-10-CM | POA: Diagnosis present

## 2024-03-18 DIAGNOSIS — R410 Disorientation, unspecified: Secondary | ICD-10-CM | POA: Insufficient documentation

## 2024-03-18 DIAGNOSIS — N281 Cyst of kidney, acquired: Secondary | ICD-10-CM | POA: Diagnosis present

## 2024-03-18 DIAGNOSIS — Z79890 Hormone replacement therapy (postmenopausal): Secondary | ICD-10-CM

## 2024-03-18 DIAGNOSIS — F0390 Unspecified dementia without behavioral disturbance: Secondary | ICD-10-CM | POA: Diagnosis present

## 2024-03-18 DIAGNOSIS — R911 Solitary pulmonary nodule: Secondary | ICD-10-CM | POA: Diagnosis present

## 2024-03-18 DIAGNOSIS — I739 Peripheral vascular disease, unspecified: Secondary | ICD-10-CM | POA: Diagnosis present

## 2024-03-18 DIAGNOSIS — I350 Nonrheumatic aortic (valve) stenosis: Secondary | ICD-10-CM | POA: Diagnosis present

## 2024-03-18 DIAGNOSIS — Z91041 Radiographic dye allergy status: Secondary | ICD-10-CM

## 2024-03-18 DIAGNOSIS — I5033 Acute on chronic diastolic (congestive) heart failure: Secondary | ICD-10-CM | POA: Diagnosis present

## 2024-03-18 DIAGNOSIS — B348 Other viral infections of unspecified site: Principal | ICD-10-CM

## 2024-03-18 DIAGNOSIS — R339 Retention of urine, unspecified: Secondary | ICD-10-CM | POA: Diagnosis present

## 2024-03-18 DIAGNOSIS — R6510 Systemic inflammatory response syndrome (SIRS) of non-infectious origin without acute organ dysfunction: Secondary | ICD-10-CM

## 2024-03-18 DIAGNOSIS — Z1152 Encounter for screening for COVID-19: Secondary | ICD-10-CM

## 2024-03-18 DIAGNOSIS — E039 Hypothyroidism, unspecified: Secondary | ICD-10-CM | POA: Diagnosis present

## 2024-03-18 DIAGNOSIS — N182 Chronic kidney disease, stage 2 (mild): Secondary | ICD-10-CM | POA: Diagnosis present

## 2024-03-18 DIAGNOSIS — Z800 Family history of malignant neoplasm of digestive organs: Secondary | ICD-10-CM

## 2024-03-18 DIAGNOSIS — B9789 Other viral agents as the cause of diseases classified elsewhere: Secondary | ICD-10-CM | POA: Diagnosis present

## 2024-03-18 DIAGNOSIS — R062 Wheezing: Secondary | ICD-10-CM

## 2024-03-18 LAB — CBC
HCT: 38.2 % (ref 36.0–46.0)
HCT: 40.9 % (ref 36.0–46.0)
Hemoglobin: 12.5 g/dL (ref 12.0–15.0)
Hemoglobin: 13.4 g/dL (ref 12.0–15.0)
MCH: 29.8 pg (ref 26.0–34.0)
MCH: 30.4 pg (ref 26.0–34.0)
MCHC: 32.7 g/dL (ref 30.0–36.0)
MCHC: 32.8 g/dL (ref 30.0–36.0)
MCV: 91 fL (ref 80.0–100.0)
MCV: 92.7 fL (ref 80.0–100.0)
Platelets: 311 K/uL (ref 150–400)
Platelets: 316 K/uL (ref 150–400)
RBC: 4.2 MIL/uL (ref 3.87–5.11)
RBC: 4.41 MIL/uL (ref 3.87–5.11)
RDW: 14 % (ref 11.5–15.5)
RDW: 14.1 % (ref 11.5–15.5)
WBC: 14.4 K/uL — ABNORMAL HIGH (ref 4.0–10.5)
WBC: 17.2 K/uL — ABNORMAL HIGH (ref 4.0–10.5)
nRBC: 0.1 % (ref 0.0–0.2)
nRBC: 0.2 % (ref 0.0–0.2)

## 2024-03-18 LAB — RESPIRATORY PANEL BY PCR

## 2024-03-18 LAB — MAGNESIUM: Magnesium: 1.4 mg/dL — ABNORMAL LOW (ref 1.7–2.4)

## 2024-03-18 LAB — ECHOCARDIOGRAM COMPLETE
AV Peak grad: 10.6 mmHg
Ao pk vel: 1.63 m/s
Area-P 1/2: 3.24 cm2
Height: 60 in
S' Lateral: 2.4 cm
Weight: 2560 [oz_av]

## 2024-03-18 LAB — RESP PANEL BY RT-PCR (RSV, FLU A&B, COVID)  RVPGX2
Influenza A by PCR: NEGATIVE
Influenza B by PCR: NEGATIVE
Resp Syncytial Virus by PCR: NEGATIVE
SARS Coronavirus 2 by RT PCR: NEGATIVE

## 2024-03-18 LAB — BASIC METABOLIC PANEL WITH GFR
Anion gap: 12 (ref 5–15)
Anion gap: 10 (ref 5–15)
BUN: 52 mg/dL — ABNORMAL HIGH (ref 8–23)
BUN: 44 mg/dL — ABNORMAL HIGH (ref 8–23)
CO2: 21 mmol/L — ABNORMAL LOW (ref 22–32)
CO2: 21 mmol/L — ABNORMAL LOW (ref 22–32)
Calcium: 9.4 mg/dL (ref 8.9–10.3)
Calcium: 9.3 mg/dL (ref 8.9–10.3)
Chloride: 103 mmol/L (ref 98–111)
Chloride: 103 mmol/L (ref 98–111)
Creatinine, Ser: 1.98 mg/dL — ABNORMAL HIGH (ref 0.44–1.00)
Creatinine, Ser: 1.83 mg/dL — ABNORMAL HIGH (ref 0.44–1.00)
GFR, Estimated: 24 mL/min — ABNORMAL LOW (ref >60)
GFR, Estimated: 26 mL/min — ABNORMAL LOW (ref >60)
Glucose, Bld: 114 mg/dL — ABNORMAL HIGH (ref 70–99)
Glucose, Bld: 165 mg/dL — ABNORMAL HIGH (ref 70–99)
Potassium: 4.7 mmol/L (ref 3.5–5.1)
Potassium: 4.6 mmol/L (ref 3.5–5.1)
Sodium: 136 mmol/L (ref 135–145)
Sodium: 134 mmol/L — ABNORMAL LOW (ref 135–145)

## 2024-03-18 LAB — BRAIN NATRIURETIC PEPTIDE: B Natriuretic Peptide: 163.6 pg/mL — ABNORMAL HIGH (ref 0.0–100.0)

## 2024-03-18 LAB — D-DIMER, QUANTITATIVE: D-Dimer, Quant: 0.58 ug{FEU}/mL — ABNORMAL HIGH (ref 0.00–0.50)

## 2024-03-18 LAB — PROCALCITONIN: Procalcitonin: 1.56 ng/mL

## 2024-03-18 LAB — TROPONIN I (HIGH SENSITIVITY): Troponin I (High Sensitivity): 76 ng/L — ABNORMAL HIGH (ref <18)

## 2024-03-18 LAB — PHOSPHORUS: Phosphorus: 3.1 mg/dL (ref 2.5–4.6)

## 2024-03-18 LAB — LACTIC ACID, PLASMA: Lactic Acid, Venous: 1.6 mmol/L (ref 0.5–1.9)

## 2024-03-18 MED ORDER — HALOPERIDOL LACTATE 5 MG/ML IJ SOLN
1.5000 mg | Freq: Four times a day (QID) | INTRAMUSCULAR | Status: AC | PRN
  Administered 2024-03-18: 1.5 mg via INTRAVENOUS
  Filled 2024-03-18: qty 1

## 2024-03-18 MED ORDER — ALPRAZOLAM 0.5 MG PO TABS
0.5000 mg | ORAL_TABLET | Freq: Two times a day (BID) | ORAL | Status: DC | PRN
  Administered 2024-03-18: 0.5 mg via ORAL
  Filled 2024-03-18: qty 1

## 2024-03-18 MED ORDER — PANTOPRAZOLE SODIUM 40 MG PO TBEC
40.0000 mg | DELAYED_RELEASE_TABLET | Freq: Every day | ORAL | Status: DC
  Administered 2024-03-18 – 2024-03-24 (×7): 40 mg via ORAL
  Filled 2024-03-18 (×7): qty 1

## 2024-03-18 MED ORDER — ACETAMINOPHEN 650 MG RE SUPP: 650.0000 mg | Freq: Four times a day (QID) | RECTAL | Status: DC | PRN

## 2024-03-18 MED ORDER — SODIUM CHLORIDE 0.9 % IV SOLN
500.0000 mg | INTRAVENOUS | Status: DC
  Administered 2024-03-18 – 2024-03-20 (×3): 500 mg via INTRAVENOUS
  Filled 2024-03-18 (×3): qty 5

## 2024-03-18 MED ORDER — FUROSEMIDE 10 MG/ML IJ SOLN
20.0000 mg | Freq: Two times a day (BID) | INTRAMUSCULAR | Status: DC
  Administered 2024-03-18 – 2024-03-19 (×3): 20 mg via INTRAVENOUS
  Filled 2024-03-18: qty 4
  Filled 2024-03-18 (×2): qty 2

## 2024-03-18 MED ORDER — METOPROLOL TARTRATE 50 MG PO TABS
50.0000 mg | ORAL_TABLET | Freq: Two times a day (BID) | ORAL | Status: DC
  Administered 2024-03-18 – 2024-03-22 (×9): 50 mg via ORAL
  Filled 2024-03-18 (×9): qty 1

## 2024-03-18 MED ORDER — SODIUM CHLORIDE 0.9 % IV SOLN
2.0000 g | Freq: Once | INTRAVENOUS | Status: AC
  Administered 2024-03-18: 2 g via INTRAVENOUS
  Filled 2024-03-18: qty 12.5

## 2024-03-18 MED ORDER — SODIUM CHLORIDE 0.9 % IV SOLN: INTRAVENOUS | Status: DC

## 2024-03-18 MED ORDER — MAGNESIUM SULFATE 2 GM/50ML IV SOLN
2.0000 g | Freq: Once | INTRAVENOUS | Status: AC
  Administered 2024-03-18: 2 g via INTRAVENOUS
  Filled 2024-03-18: qty 50

## 2024-03-18 MED ORDER — HALOPERIDOL LACTATE 5 MG/ML IJ SOLN
1.0000 mg | Freq: Four times a day (QID) | INTRAMUSCULAR | Status: DC | PRN
  Administered 2024-03-18: 1 mg via INTRAVENOUS
  Filled 2024-03-18: qty 1

## 2024-03-18 MED ORDER — HALOPERIDOL LACTATE 5 MG/ML IJ SOLN
INTRAMUSCULAR | Status: AC
  Administered 2024-03-18: 1.5 mg via INTRAVENOUS
  Filled 2024-03-18: qty 1

## 2024-03-18 MED ORDER — ONDANSETRON HCL 4 MG/2ML IJ SOLN: 4.0000 mg | Freq: Four times a day (QID) | INTRAMUSCULAR | Status: DC | PRN

## 2024-03-18 MED ORDER — IPRATROPIUM-ALBUTEROL 0.5-2.5 (3) MG/3ML IN SOLN
3.0000 mL | Freq: Once | RESPIRATORY_TRACT | Status: AC
  Administered 2024-03-18: 3 mL via RESPIRATORY_TRACT
  Filled 2024-03-18: qty 3

## 2024-03-18 MED ORDER — LEVOTHYROXINE SODIUM 50 MCG PO TABS
75.0000 ug | ORAL_TABLET | Freq: Every day | ORAL | Status: DC
  Administered 2024-03-18 – 2024-03-22 (×5): 75 ug via ORAL
  Filled 2024-03-18 (×2): qty 1
  Filled 2024-03-18: qty 2
  Filled 2024-03-18 (×3): qty 1

## 2024-03-18 MED ORDER — IPRATROPIUM-ALBUTEROL 0.5-2.5 (3) MG/3ML IN SOLN: 3.0000 mL | RESPIRATORY_TRACT | Status: DC | PRN

## 2024-03-18 MED ORDER — APIXABAN 2.5 MG PO TABS
2.5000 mg | ORAL_TABLET | Freq: Two times a day (BID) | ORAL | Status: AC
Start: 2024-03-18 — End: ?
  Administered 2024-03-18 – 2024-03-24 (×13): 2.5 mg via ORAL
  Filled 2024-03-18 (×13): qty 1

## 2024-03-18 MED ORDER — TRAZODONE HCL 50 MG PO TABS
25.0000 mg | ORAL_TABLET | Freq: Once | ORAL | Status: AC
  Administered 2024-03-18: 25 mg via ORAL
  Filled 2024-03-18: qty 1

## 2024-03-18 MED ORDER — GUAIFENESIN ER 600 MG PO TB12
600.0000 mg | ORAL_TABLET | Freq: Two times a day (BID) | ORAL | Status: DC
  Administered 2024-03-18 – 2024-03-24 (×10): 600 mg via ORAL
  Filled 2024-03-18 (×11): qty 1

## 2024-03-18 MED ORDER — ACETAMINOPHEN 325 MG PO TABS
650.0000 mg | ORAL_TABLET | Freq: Four times a day (QID) | ORAL | Status: DC | PRN
  Administered 2024-03-20 – 2024-03-23 (×4): 650 mg via ORAL
  Filled 2024-03-18 (×4): qty 2

## 2024-03-18 MED ORDER — HYDROCODONE-ACETAMINOPHEN 5-325 MG PO TABS
1.0000 | ORAL_TABLET | ORAL | Status: DC | PRN
  Administered 2024-03-19 – 2024-03-21 (×2): 1 via ORAL
  Filled 2024-03-18 (×2): qty 1

## 2024-03-18 MED ORDER — METHYLPREDNISOLONE SODIUM SUCC 40 MG IJ SOLR
40.0000 mg | Freq: Every day | INTRAMUSCULAR | Status: AC
  Administered 2024-03-18 – 2024-03-19 (×2): 40 mg via INTRAVENOUS
  Filled 2024-03-18 (×2): qty 1

## 2024-03-18 MED ORDER — ASPIRIN 81 MG PO CHEW
324.0000 mg | CHEWABLE_TABLET | Freq: Once | ORAL | Status: AC
  Administered 2024-03-18: 243 mg via ORAL
  Filled 2024-03-18: qty 4

## 2024-03-18 MED ORDER — QUETIAPINE FUMARATE 25 MG PO TABS
25.0000 mg | ORAL_TABLET | Freq: Once | ORAL | Status: AC
  Administered 2024-03-18: 25 mg via ORAL
  Filled 2024-03-18: qty 1

## 2024-03-18 MED ORDER — PREDNISONE 20 MG PO TABS
40.0000 mg | ORAL_TABLET | Freq: Every day | ORAL | Status: AC
  Administered 2024-03-20 – 2024-03-22 (×3): 40 mg via ORAL
  Filled 2024-03-18 (×3): qty 2

## 2024-03-18 MED ORDER — SODIUM CHLORIDE 0.9 % IV SOLN
2.0000 g | INTRAVENOUS | Status: AC
  Administered 2024-03-18 – 2024-03-22 (×5): 2 g via INTRAVENOUS
  Filled 2024-03-18 (×5): qty 20

## 2024-03-18 MED ORDER — METRONIDAZOLE 500 MG/100ML IV SOLN
500.0000 mg | Freq: Once | INTRAVENOUS | Status: AC
  Administered 2024-03-18: 500 mg via INTRAVENOUS
  Filled 2024-03-18: qty 100

## 2024-03-18 MED ORDER — ONDANSETRON HCL 4 MG PO TABS: 4.0000 mg | ORAL_TABLET | Freq: Four times a day (QID) | ORAL | Status: DC | PRN

## 2024-03-18 MED ORDER — LACTATED RINGERS IV BOLUS (SEPSIS)
500.0000 mL | Freq: Once | INTRAVENOUS | Status: AC
  Administered 2024-03-18: 500 mL via INTRAVENOUS

## 2024-03-18 MED ORDER — VANCOMYCIN HCL IN DEXTROSE 1-5 GM/200ML-% IV SOLN
1000.0000 mg | Freq: Once | INTRAVENOUS | Status: AC
  Administered 2024-03-18: 1000 mg via INTRAVENOUS
  Filled 2024-03-18: qty 200

## 2024-03-18 NOTE — ED Notes (Signed)
 EDP requesting rectal temp

## 2024-03-18 NOTE — ED Notes (Signed)
 Pt given second dose of Haldol per verbal order from Dr. Cleatus. Pt continues to be restless and agitated. Daughter remains at bedside.

## 2024-03-18 NOTE — ED Triage Notes (Signed)
 Daughter reports that patient when laying down tonight reported she couldn't breath. Still congested. Daughter reports she is sleeping about 20 hours a day.   Recently finish doxy and cipro for UTI and possible of pneumonia. Last night finished prednisone

## 2024-03-18 NOTE — Assessment & Plan Note (Signed)
 Follow-up per radiology with repeat CT scan in 3 to 6 months

## 2024-03-18 NOTE — Assessment & Plan Note (Addendum)
 Rhinovirus +ve Rocephin and azithromycin Guaifenesin DuoNebs as needed

## 2024-03-18 NOTE — Progress Notes (Signed)
 CODE SEPSIS - PHARMACY COMMUNICATION  **Broad Spectrum Antibiotics should be administered within 1 hour of Sepsis diagnosis**  Time Code Sepsis Called/Page Received: 10/12 @ 0111   Antibiotics Ordered: Cefepime, Vanc   Time of 1st antibiotic administration: Cefepime 2 gm IV X 1 on 10/12 @ 0133   Additional action taken by pharmacy:   If necessary, Name of Provider/Nurse Contacted:     Alif Petrak D ,PharmD Clinical Pharmacist  03/18/2024  1:43 AM

## 2024-03-18 NOTE — H&P (Signed)
 History and Physical    Patient: Jacqueline Lozano FMW:[id number] DOB: Jul 11, 1934 DOA: 03/18/2024 DOS: the patient was seen and examined on 03/18/2024 PCP: Stanton Lynwood FALCON, MD  Patient coming from: Home  Chief Complaint:  Chief Complaint  Patient presents with   Cough   Fatigue    HPI: Jacqueline Lozano is a 89 y.o. female with medical history significant for Permanent A-fib s/p ablation on Eliquis , complete heart block s/p PPM , HTN, HFpEF, hypothyroidism, aortic valve stenosis being admitted with community-acquired pneumonia with a possible element of CHF.  She presented with shortness of breath, worse on lying flat in the setting of recently completing a course of antibiotics for UTI.  She has a nonproductive cough without fever or chills.  Of late has been sleeping a lot. In the ED, Slightly hypothermic at 94.5 with otherwise normal vitals Labs with troponin 76 and BNP 163 WBC 14.4 with lactic acid 1.6, procalcitonin 1.5 and negative respiratory viral panel BMP with creatinine 1.98 above baseline of 1.6 in April EKG showed a ventricular paced rhythm at 71 CT chest without contrast showing multiple pulmonary nodules most significant solid pulmonary nodule of 6 mm with recommendation for noncontrasted CT chest in 3 to 6 months Patient was treated with cefepime, Flagyl,vancomycin and LR bolus and DuoNeb also given chewable aspirin 324 Admission requested     Past Medical History:  Diagnosis Date   Abnormal echocardiogram 09/2009   EF >55%, mild LVH, moderate LAE, normal RV, normal PA systolic pressure   Allergic to IV contrast    Chronic diastolic CHF (congestive heart failure) (HCC)    Complete heart block-S./P. AV junction ablation 11/23/2010   Edentulous    Top.  Has most of bottom teeth   GERD (gastroesophageal reflux disease)    Hyperlipidemia    Has been unable to take statins due to muscle pain. Has tried multiple statins per her report.   Hypertension     Hypothyroidism    Migraine    none recently   OSA (obstructive sleep apnea)    Not consistently using CPAP   Pacemaker-Medtronic CRT P. 11/23/2010   Atrial port was plugged [facility] 12/22/18)   PAD (peripheral artery disease)    Occlusive disease involving left PT and AT   Permanent atrial fibrillation (HCC)    Uses wheelchair    Able to stand and self transfer.   Past Surgical History:  Procedure Laterality Date   APPENDECTOMY     ATRIAL ABLATION SURGERY  06/12/2010   BIV PACEMAKER GENERATOR CHANGEOUT N/A 12/22/2018   Procedure: BIV PACEMAKER GENERATOR CHANGEOUT;  Surgeon: Fernande Elspeth BROCKS, MD;  Location: [facility] INVASIVE CV LAB;  Service: Cardiovascular;  Laterality: N/A;   CARDIAC CATHETERIZATION     Left heart cath x3 in teh past, last 5 yrs ago. All normal per pt report   CARDIOVERSION     CARPAL TUNNEL RELEASE     CATARACT EXTRACTION W/PHACO Right 10/29/2019   Procedure: CATARACT EXTRACTION PHACO AND INTRAOCULAR LENS PLACEMENT (IOC) RIGHT 3.87 00:41.2;  Surgeon: Myrna Adine Anes, MD;  Location: [facility];  Service: Ophthalmology;  Laterality: Right;  sleep apnea   CATARACT EXTRACTION W/PHACO Left 12/03/2019   Procedure: CATARACT EXTRACTION PHACO AND INTRAOCULAR LENS PLACEMENT (IOC) LEFT 6.02  00:51.3;  Surgeon: Myrna Adine Anes, MD;  Location: [facility];  Service: Ophthalmology;  Laterality: Left;  sleep apnea   CHOLECYSTECTOMY OPEN     COLONOSCOPY WITH PROPOFOL  N/A 09/08/2015   Procedure: COLONOSCOPY WITH  PROPOFOL ;  Surgeon: Donnice Vaughn Manes, MD;  Location: [facility] ENDOSCOPY;  Service: Endoscopy;  Laterality: N/A;   ESOPHAGOGASTRODUODENOSCOPY (EGD) WITH PROPOFOL  N/A 09/08/2015   Procedure: ESOPHAGOGASTRODUODENOSCOPY (EGD) WITH PROPOFOL ;  Surgeon: Donnice Vaughn Manes, MD;  Location: [facility] ENDOSCOPY;  Service: Endoscopy;  Laterality: N/A;   PACEMAKER INSERTION  06/12/2010   VESICOVAGINAL FISTULA CLOSURE W/ TAH     Social History:  reports that she has never  smoked. She has never used smokeless tobacco. She reports that she does not drink alcohol and does not use drugs.  Allergies  Allergen Reactions   Codeine Shortness Of Breath and Other (See Comments)   Farxiga  [Dapagliflozin ] Other (See Comments)    Seizure like activity   Iodinated Contrast Media Shortness Of Breath and Other (See Comments)   Morphine Shortness Of Breath and Other (See Comments)   Other Shortness Of Breath, Nausea And Vomiting and Other (See Comments)    Contrast dye   Oxycodone Shortness Of Breath   Shellfish Protein-Containing Drug Products Shortness Of Breath, Nausea And Vomiting and Rash   Dipyridamole Other (See Comments)    Unknown   Iodine Other (See Comments)   Omeprazole Other (See Comments)   Oxycodone-Acetaminophen  Other (See Comments)   Statins Other (See Comments)    Unknown   Sulfa Antibiotics Other (See Comments)    Other reaction(s): Unknown   Sulfonamide Derivatives Other (See Comments)    Unknown   Amlodipine  Rash and Other (See Comments)    Family History  Problem Relation Age of Onset   Heart attack Mother        MI x2   Heart attack Father        MI   Colon cancer Daughter    Stroke Neg Hx     Prior to Admission medications   Medication Sig Start Date End Date Taking? Authorizing Provider  acetaminophen  (TYLENOL ) 325 MG tablet Take 325 mg by mouth as needed for headache.    [provider]  Alum Hydroxide-Mag Carbonate (GAVISCON PO) Take by mouth as needed.    [provider]  apixaban  (ELIQUIS ) 2.5 MG TABS tablet Take 1 tablet by mouth twice daily 01/30/24   [name], Timothy J, MD  ASPIRIN 81 PO Take 1 tablet by mouth as needed. For headache    [provider]  bismuth subsalicylate (PEPTO BISMOL) 262 MG chewable tablet Chew 262 mg by mouth as needed.    [provider]  cholecalciferol (VITAMIN D3) 25 MCG (1000 UNIT) tablet Take 1,000 Units by mouth daily.    [provider]   cyanocobalamin (,VITAMIN B-12,) 1000 MCG/ML injection Inject 1,000 mcg into the muscle every 30 (thirty) days. 03/30/19   [provider]  dimenhyDRINATE (DRAMAMINE PO) Take 1 tablet by mouth as needed.    [provider]  diphenoxylate-atropine (LOMOTIL) 2.5-0.025 MG tablet Take 1 tablet by mouth as needed for diarrhea or loose stools. 09/19/19   [provider]  furosemide  (LASIX ) 20 MG tablet TAKE 1 TABLET BY MOUTH ONCE DAILY AS DIRECTED. TAKE A SECOND TAB AFTER LUNCH IF YOU HAVE LEG SWELLING 01/03/24   [name], Timothy J, MD  ibuprofen (ADVIL) 200 MG tablet Take 200 mg by mouth every 6 (six) hours as needed for headache or mild pain.    [provider]  levothyroxine (SYNTHROID) 75 MCG tablet Take 75 mcg by mouth daily before breakfast.    [provider]  MAGNESIUM GLYCINATE PO Take 1 tablet by mouth daily.  [provider]  metoprolol  tartrate (LOPRESSOR ) 50 MG tablet TAKE 1 & 1/2 (ONE & ONE-HALF) TABLETS BY MOUTH TWICE DAILY 02/21/24   [name], Timothy J, MD  nitroGLYCERIN  (NITROSTAT ) 0.4 MG SL tablet DISSOLVE ONE TABLET UNDER THE TONGUE EVERY 5 MINUTES AS NEEDED FOR CHEST PAIN.  DO NOT EXCEED A TOTAL OF 3 DOSES IN 15 MINUTES 11/03/23   [name], Timothy J, MD  omeprazole (PRILOSEC) 20 MG capsule Take 20 mg by mouth daily. 04/18/22   [provider]  ondansetron  (ZOFRAN -ODT) 4 MG disintegrating tablet Take 1 tablet (4 mg total) by mouth every 8 (eight) hours as needed for nausea or vomiting. 09/17/21   Sung, Jade J, MD  potassium chloride  (KLOR-CON ) 10 MEQ tablet Take 1 tablet by mouth once daily 02/10/24   [name], Timothy J, MD  prochlorperazine  (COMPAZINE ) 5 MG tablet Take 1 tablet (5 mg total) by mouth every 6 (six) hours as needed. 05/22/13   [name], Timothy J, MD  traMADol  (ULTRAM ) 50 MG tablet Take 1 tablet (50 mg total) by mouth every 6 (six) hours as needed. 09/17/21   Sung, Jade J, MD  Vitamin D, Ergocalciferol, (DRISDOL) 1.25 MG  (50000 UNIT) CAPS capsule Take 50,000 Units by mouth once a week. 04/14/22   [provider]    Physical Exam: Vitals:   03/18/24 0021 03/18/24 0026 03/18/24 0120 03/18/24 0151  BP: (!) 154/85   (!) 141/87  Pulse: 71   77  Resp: 17   17  Temp: (!) 94.5 F (34.7 C)  98.5 F (36.9 C)   TempSrc: Oral  Rectal   SpO2: 95%   93%  Weight:  72.6 kg    Height:  5' (1.524 m)     Physical Exam Vitals and nursing note reviewed.  Constitutional:      General: She is not in acute distress.    Comments: Restless, daughter beside her  HENT:     Head: Normocephalic and atraumatic.  Cardiovascular:     Rate and Rhythm: Normal rate and regular rhythm.     Heart sounds: Normal heart sounds.  Pulmonary:     Effort: Pulmonary effort is normal.     Breath sounds: Normal breath sounds.  Abdominal:     Palpations: Abdomen is soft.     Tenderness: There is no abdominal tenderness.  Neurological:     Mental Status: Mental status is at baseline.     Labs on Admission: I have personally reviewed following labs and imaging studies  CBC: Recent Labs  Lab 03/18/24 0032  WBC 14.4*  HGB 13.4  HCT 40.9  MCV 92.7  PLT 311   Basic Metabolic Panel: Recent Labs  Lab 03/18/24 0032  NA 136  K 4.7  CL 103  CO2 21*  GLUCOSE 114*  BUN 52*  CREATININE 1.98*  CALCIUM 9.4   GFR: Estimated Creatinine Clearance: 17.8 mL/min (A) (by C-G formula based on SCr of 1.98 mg/dL (H)). Liver Function Tests: No results for input(s): AST, ALT, ALKPHOS, BILITOT, PROT, ALBUMIN in the last 168 hours. No results for input(s): LIPASE, AMYLASE in the last 168 hours. No results for input(s): AMMONIA in the last 168 hours. Coagulation Profile: No results for input(s): INR, PROTIME in the last 168 hours. Cardiac Enzymes: No results for input(s): CKTOTAL, CKMB, CKMBINDEX, TROPONINI in the last 168 hours. BNP (last 3 results) No results for input(s): PROBNP in the last 8760  hours. HbA1C: No results for input(s): HGBA1C in the last 72 hours. CBG:  No results for input(s): GLUCAP in the last 168 hours. Lipid Profile: No results for input(s): CHOL, HDL, LDLCALC, TRIG, CHOLHDL, LDLDIRECT in the last 72 hours. Thyroid Function Tests: No results for input(s): TSH, T4TOTAL, FREET4, T3FREE, THYROIDAB in the last 72 hours. Anemia Panel: No results for input(s): VITAMINB12, FOLATE, FERRITIN, TIBC, IRON, RETICCTPCT in the last 72 hours. Urine analysis:    Component Value Date/Time   COLORURINE YELLOW (A) 09/16/2021 1940   APPEARANCEUR HAZY (A) 09/16/2021 1940   APPEARANCEUR Clear 06/11/2014 0705   LABSPEC 1.011 09/16/2021 1940   LABSPEC 1.017 06/11/2014 0705   PHURINE 5.0 09/16/2021 1940   GLUCOSEU NEGATIVE 09/16/2021 1940   GLUCOSEU Negative 06/11/2014 0705   HGBUR NEGATIVE 09/16/2021 1940   BILIRUBINUR NEGATIVE 09/16/2021 1940   BILIRUBINUR Negative 06/11/2014 0705   KETONESUR NEGATIVE 09/16/2021 1940   PROTEINUR NEGATIVE 09/16/2021 1940   NITRITE NEGATIVE 09/16/2021 1940   LEUKOCYTESUR SMALL (A) 09/16/2021 1940   LEUKOCYTESUR Trace 06/11/2014 0705    Radiological Exams on Admission: CT Chest Wo Contrast Result Date: 03/18/2024 CLINICAL DATA:  Orthopnea EXAM: CT CHEST WITHOUT CONTRAST TECHNIQUE: Multidetector CT imaging of the chest was performed following the standard protocol without IV contrast. RADIATION DOSE REDUCTION: This exam was performed according to the departmental dose-optimization program which includes automated exposure control, adjustment of the mA and/or kV according to patient size and/or use of iterative reconstruction technique. COMPARISON:  Chest x-ray from earlier in the same day. FINDINGS: Cardiovascular: Atherosclerotic calcifications of the thoracic aorta are noted. Coronary calcifications are seen. Minimal pericardial effusion is noted. No cardiac enlargement is noted. Pacing device is seen.  Mediastinum/Nodes: Thoracic inlet is within normal limits. No hilar or mediastinal adenopathy is noted. Esophagus as visualized is within normal limits. Lungs/Pleura: Lungs are well aerated bilaterally. No focal confluent infiltrate is seen. Scattered pulmonary nodules noted bilaterally. The largest of these measures 6 mm in dimension. No sizable effusion is seen. Upper Abdomen: Nonobstructing left renal stone is noted. No acute abnormality noted. Musculoskeletal: No chest wall mass or suspicious bone lesions identified. IMPRESSION: Multiple pulmonary nodules. Most significant: Solid pulmonary nodule measuring 6 mm. Per Fleischner Society Guidelines, recommend a non-contrast Chest CT at 3-6 months, then consider another non-contrast Chest CT at 18-24 months. If patient is low risk for malignancy, non-contrast Chest CT at 18-24 months is optional. These guidelines do not apply to immunocompromised patients and patients with cancer. Follow up in patients with significant comorbidities as clinically warranted. For lung cancer screening, adhere to Lung-RADS guidelines. Reference: Radiology. 2017; 284(1):228-43. Mild pericardial effusion. Electronically Signed   By: Oneil Devonshire M.D.   On: 03/18/2024 02:43   DG Chest 2 View Result Date: 03/18/2024 CLINICAL DATA:  Cough EXAM: CHEST - 2 VIEW COMPARISON:  02/08/2023 FINDINGS: Left pacer remains in place, unchanged. Cardiomegaly. No confluent opacities, effusions or edema. No acute bony abnormality. IMPRESSION: Cardiomegaly.  No active disease. Electronically Signed   By: Franky Crease M.D.   On: 03/18/2024 01:06   Data Reviewed for HPI: Relevant notes from primary care and specialist visits, past discharge summaries as available in EHR, including Care Everywhere. Prior diagnostic testing as pertinent to current admission diagnoses Updated medications and problem lists for reconciliation ED course, including vitals, labs, imaging, treatment and response to  treatment Triage notes, nursing and pharmacy notes and ED provider's notes Notable results as noted above in HPI      Assessment and Plan: * CAP (community acquired pneumonia) Rhinovirus +ve Rocephin and azithromycin Guaifenesin  DuoNebs as needed  Acute on chronic heart failure with preserved ejection fraction (HFpEF) (HCC) Mild exacerbation Orthopnea with BNP 163.  CT chest without effusion or pulmonary vascular congestion Low-dose IV Lasix  Continue metoprolol   Incidental pulmonary nodule, > 3mm and < 8mm Follow-up per radiology with repeat CT scan in 3 to 6 months  Permanent atrial fibrillation (HCC) Chronic anticoagulation Continue Eliquis  and metoprolol   Delirium Patient restless, agitated while in the emergency room Haldol administered Delirium precautions  Aortic valve stenosis No acute issues  OSA (obstructive sleep apnea) CPAP nightly if desired  Complete heart block s/p biventricular cardiac pacemaker No acute issues suspected     DVT prophylaxis: Apixaban   Consults: none  Advance Care Planning:   Code Status: Prior   Family Communication: Daughter at bedside  Disposition Plan: Back to previous home environment  Severity of Illness: The appropriate patient status for this patient is OBSERVATION. Observation status is judged to be reasonable and necessary in order to provide the required intensity of service to ensure the patient's safety. The patient's presenting symptoms, physical exam findings, and initial radiographic and laboratory data in the context of their medical condition is felt to place them at decreased risk for further clinical deterioration. Furthermore, it is anticipated that the patient will be medically stable for discharge from the hospital within 2 midnights of admission.   Author: Delayne LULLA Solian, MD 03/18/2024 4:26 AM  For on call review [email or web address].

## 2024-03-18 NOTE — ED Notes (Signed)
 The pt was placed on 2 liters of O2 due to the pt's pulse ox being 86 to 89 percent. The pt appears very anxious. The pt was given a busy mat and the doctor was advised of her anxiety level. The pt was administered 0.5 mg of Xanax to aide with her anxiety.

## 2024-03-18 NOTE — Plan of Care (Signed)
  Problem: Clinical Measurements: Goal: Ability to maintain clinical measurements within normal limits will improve Outcome: Progressing   Problem: Activity: Goal: Risk for activity intolerance will decrease Outcome: Progressing   Problem: Elimination: Goal: Will not experience complications related to bowel motility Outcome: Progressing   Problem: Respiratory: Goal: Ability to maintain adequate ventilation will improve Outcome: Progressing   Problem: Cardiac: Goal: Ability to achieve and maintain adequate cardiopulmonary perfusion will improve Outcome: Progressing

## 2024-03-18 NOTE — Progress Notes (Signed)
 OT Cancellation Note  Patient Details Name: Jacqueline Lozano MRN: [id number] DOB: 09-17-1934   Cancelled Treatment:    Reason Eval/Treat Not Completed: Patient at procedure or test/ unavailable. Pt off the floor when OT eval attempted. Will try again at a later time/date.  Suzen Hock 03/18/2024, 2:36 PM

## 2024-03-18 NOTE — ED Notes (Signed)
EDP at bedside for patient evaluation.

## 2024-03-18 NOTE — Assessment & Plan Note (Signed)
 Mild exacerbation Orthopnea with BNP 163.  CT chest without effusion or pulmonary vascular congestion Low-dose IV Lasix  Continue metoprolol 

## 2024-03-18 NOTE — Progress Notes (Signed)
  Echocardiogram 2D Echocardiogram has been performed.  Jacqueline Lozano Louder 03/18/2024, 3:42 PM

## 2024-03-18 NOTE — ED Notes (Signed)
 Echo at the bedside

## 2024-03-18 NOTE — Assessment & Plan Note (Signed)
 Patient restless, agitated while in the emergency room Haldol administered Delirium precautions

## 2024-03-18 NOTE — ED Notes (Signed)
 Pt continues to be restless in bed. This RN at bedside. Pt placed on bed pan at this time.

## 2024-03-18 NOTE — Assessment & Plan Note (Signed)
 Chronic anticoagulation Continue Eliquis and metoprolol

## 2024-03-18 NOTE — Plan of Care (Signed)
 Patient was seen and examined at bedside.  Admitted last night secondary to pneumonia, rhinovirus positive.  Possible CHF.  In the morning time patient was a little anxious, she did not sleep last night and has had decreased oral intake.  Patient's daughter was at bedside, management plan discussed. Hypomagnesemia, mag repleted. Renal failure, started gentle hydration, bladder scan negative for urinary retention, follow-up renal sonogram. Started symptomatic treatment with DuoNeb and Solu-Medrol. Patient will need PT and OT eval before discharge.  No charge note, admitted today after midnight.

## 2024-03-18 NOTE — Sepsis Progress Note (Addendum)
 Elink following for sepsis protocol, pt recently tx for PNA,UTI    BC collected 0138, abx's documented at 17

## 2024-03-18 NOTE — ED Provider Notes (Addendum)
 [facility] Provider Note    Event Date/Time   First MD Initiated Contact with Patient 03/18/24 0109     (approximate)   History   Cough and Fatigue   HPI  Jacqueline Lozano is a 89 y.o. female with history of complete heart block status post pacemaker, diastolic heart failure, hypertension, hyperlipidemia, atrial fibrillation on Eliquis , hypothyroidism who presents to the emergency department her daughter with complaints of shortness of breath.  Recently finished doxycycline and prednisone for possible pneumonia and Cipro for UTI.  No fevers, vomiting, diarrhea but shortness of breath and increased work of breathing tonight.  No calf tenderness or calf swelling.  No chest pain.  Patient denies history of asthma, COPD.   History provided by patient and daughter.    Past Medical History:  Diagnosis Date   Abnormal echocardiogram 09/2009   EF >55%, mild LVH, moderate LAE, normal RV, normal PA systolic pressure   Allergic to IV contrast    Chronic diastolic CHF (congestive heart failure) (HCC)    Complete heart block-S./P. AV junction ablation 11/23/2010   Edentulous    Top.  Has most of bottom teeth   GERD (gastroesophageal reflux disease)    Hyperlipidemia    Has been unable to take statins due to muscle pain. Has tried multiple statins per her report.   Hypertension    Hypothyroidism    Migraine    none recently   OSA (obstructive sleep apnea)    Not consistently using CPAP   Pacemaker-Medtronic CRT P. 11/23/2010   Atrial port was plugged [facility] 12/22/18)   PAD (peripheral artery disease)    Occlusive disease involving left PT and AT   Permanent atrial fibrillation (HCC)    Uses wheelchair    Able to stand and self transfer.    Past Surgical History:  Procedure Laterality Date   APPENDECTOMY     ATRIAL ABLATION SURGERY  06/12/2010   BIV PACEMAKER GENERATOR CHANGEOUT N/A 12/22/2018   Procedure: BIV PACEMAKER GENERATOR CHANGEOUT;   Surgeon: Fernande Elspeth BROCKS, MD;  Location: [facility] INVASIVE CV LAB;  Service: Cardiovascular;  Laterality: N/A;   CARDIAC CATHETERIZATION     Left heart cath x3 in teh past, last 5 yrs ago. All normal per pt report   CARDIOVERSION     CARPAL TUNNEL RELEASE     CATARACT EXTRACTION W/PHACO Right 10/29/2019   Procedure: CATARACT EXTRACTION PHACO AND INTRAOCULAR LENS PLACEMENT (IOC) RIGHT 3.87 00:41.2;  Surgeon: Myrna Adine Anes, MD;  Location: [facility];  Service: Ophthalmology;  Laterality: Right;  sleep apnea   CATARACT EXTRACTION W/PHACO Left 12/03/2019   Procedure: CATARACT EXTRACTION PHACO AND INTRAOCULAR LENS PLACEMENT (IOC) LEFT 6.02  00:51.3;  Surgeon: Myrna Adine Anes, MD;  Location: [facility];  Service: Ophthalmology;  Laterality: Left;  sleep apnea   CHOLECYSTECTOMY OPEN     COLONOSCOPY WITH PROPOFOL  N/A 09/08/2015   Procedure: COLONOSCOPY WITH PROPOFOL ;  Surgeon: Donnice Vaughn Manes, MD;  Location: [facility] END[facility]COPY;  Service: Endoscopy;  Laterality: N/A;   ESOPHAGOGASTRODUODENOSCOPY (EGD) WITH PROPOFOL  N/A 09/08/2015   Procedure: ESOPHAGOGASTRODUODENOSCOPY (EGD) WITH PROPOFOL ;  Surgeon: Donnice Vaughn Manes, MD;  Location: [facility] ENDOSCOPY;  Service: Endoscopy;  Laterality: N/A;   PACEMAKER INSERTION  06/12/2010   VESICOVAGINAL FISTULA CLOSURE W/ TAH      MEDICATIONS:  Prior to Admission medications   Medication Sig Start Date End Date Taking? Authorizing Provider  acetaminophen  (TYLENOL ) 325 MG tablet Take 325 mg by mouth as needed for  headache.    [provider]  Alum Hydroxide-Mag Carbonate (GAVISCON PO) Take by mouth as needed.    [provider]  apixaban  (ELIQUIS ) 2.5 MG TABS tablet Take 1 tablet by mouth twice daily 01/30/24   [name], Timothy J, MD  ASPIRIN 81 PO Take 1 tablet by mouth as needed. For headache    [provider]  bismuth subsalicylate (PEPTO BISMOL) 262 MG chewable tablet Chew 262 mg by mouth as needed.    [provider]  cholecalciferol (VITAMIN D3) 25 MCG (1000 UNIT) tablet Take 1,000 Units by mouth daily.    [provider]  cyanocobalamin (,VITAMIN B-12,) 1000 MCG/ML injection Inject 1,000 mcg into the muscle every 30 (thirty) days. 03/30/19   [provider]  dimenhyDRINATE (DRAMAMINE PO) Take 1 tablet by mouth as needed.    [provider]  diphenoxylate-atropine (LOMOTIL) 2.5-0.025 MG tablet Take 1 tablet by mouth as needed for diarrhea or loose stools. 09/19/19   [provider]  furosemide  (LASIX ) 20 MG tablet TAKE 1 TABLET BY MOUTH ONCE DAILY AS DIRECTED. TAKE A SECOND TAB AFTER LUNCH IF YOU HAVE LEG SWELLING 01/03/24   [name], Timothy J, MD  ibuprofen (ADVIL) 200 MG tablet Take 200 mg by mouth every 6 (six) hours as needed for headache or mild pain.    [provider]  levothyroxine (SYNTHROID) 75 MCG tablet Take 75 mcg by mouth daily before breakfast.    [provider]  MAGNESIUM GLYCINATE PO Take 1 tablet by mouth daily.    [provider]  metoprolol  tartrate (LOPRESSOR ) 50 MG tablet TAKE 1 & 1/2 (ONE & ONE-HALF) TABLETS BY MOUTH TWICE DAILY 02/21/24   [name], Timothy J, MD  nitroGLYCERIN  (NITROSTAT ) 0.4 MG SL tablet DISSOLVE ONE TABLET UNDER THE TONGUE EVERY 5 MINUTES AS NEEDED FOR CHEST PAIN.  DO NOT EXCEED A TOTAL OF 3 DOSES IN 15 MINUTES 11/03/23   [name], Timothy J, MD  omeprazole (PRILOSEC) 20 MG capsule Take 20 mg by mouth daily. 04/18/22   [provider]  ondansetron  (ZOFRAN -ODT) 4 MG disintegrating tablet Take 1 tablet (4 mg total) by mouth every 8 (eight) hours as needed for nausea or vomiting. 09/17/21   Sung, Jade J, MD  potassium chloride  (KLOR-CON ) 10 MEQ tablet Take 1 tablet by mouth once daily 02/10/24   [name], Timothy J, MD  prochlorperazine  (COMPAZINE ) 5 MG tablet Take 1 tablet (5 mg total) by mouth every 6 (six) hours as needed. 05/22/13   [name], Timothy J, MD  traMADol  (ULTRAM ) 50 MG tablet Take 1  tablet (50 mg total) by mouth every 6 (six) hours as needed. 09/17/21   Sung, Jade J, MD  Vitamin D, Ergocalciferol, (DRISDOL) 1.25 MG (50000 UNIT) CAPS capsule Take 50,000 Units by mouth once a week. 04/14/22   [provider]    Physical Exam   Triage Vital Signs: ED Triage Vitals  Encounter Vitals Group     BP 03/18/24 0021 (!) 154/85     Girls Systolic BP Percentile --      Girls Diastolic BP Percentile --      Boys Systolic BP Percentile --      Boys Diastolic BP Percentile --      Pulse Rate 03/18/24 0021 71     Resp 03/18/24 0021 17     Temp 03/18/24 0021 (!) 94.5 F (34.7 C)     Temp Source 03/18/24 0021 Oral     SpO2 03/18/24 0021 95 %  Weight 03/18/24 0026 160 lb (72.6 kg)     Height 03/18/24 0026 5' (1.524 m)     Head Circumference --      Peak Flow --      Pain Score --      Pain Loc --      Pain Education --      Exclude from Growth Chart --     Most recent vital signs: Vitals:   03/18/24 0120 03/18/24 0151  BP:  (!) 141/87  Pulse:  77  Resp:  17  Temp: 98.5 F (36.9 C)   SpO2:  93%    CONSTITUTIONAL: Alert, responds appropriately to questions.  Elderly HEAD: Normocephalic, atraumatic EYES: Conjunctivae clear, pupils appear equal, sclera nonicteric ENT: normal nose; moist mucous membranes NECK: Supple, normal ROM, no JVD CARD: RRR; S1 and S2 appreciated RESP: Patient is tachypneic with slight increased work of breathing but no respiratory distress or hypoxia.  Scattered expiratory wheezes.  No rhonchi or rales. ABD/GI: Non-distended; soft, non-tender, no rebound, no guarding, no peritoneal signs BACK: The back appears normal EXT: Normal ROM in all joints; no deformity noted, no edema, no calf tenderness or calf swelling SKIN: Normal color for age and race; warm; no rash on exposed skin NEURO: Moves all extremities equally, normal speech PSYCH: The patient's mood and manner are appropriate.   ED Results / Procedures / Treatments    LABS: (all labs ordered are listed, but only abnormal results are displayed) Labs Reviewed  BASIC METABOLIC PANEL WITH GFR - Abnormal; Notable for the following components:      Result Value   CO2 21 (*)    Glucose, Bld 114 (*)    BUN 52 (*)    Creatinine, Ser 1.98 (*)    GFR, Estimated 24 (*)    All other components within normal limits  CBC - Abnormal; Notable for the following components:   WBC 14.4 (*)    All other components within normal limits  BRAIN NATRIURETIC PEPTIDE - Abnormal; Notable for the following components:   B Natriuretic Peptide 163.6 (*)    All other components within normal limits  D-DIMER, QUANTITATIVE - Abnormal; Notable for the following components:   D-Dimer, Quant 0.58 (*)    All other components within normal limits  TROPONIN I (HIGH SENSITIVITY) - Abnormal; Notable for the following components:   Troponin I (High Sensitivity) 76 (*)    All other components within normal limits  RESP PANEL BY RT-PCR (RSV, FLU A&B, COVID)  RVPGX2  CULTURE, BLOOD (ROUTINE X 2)  CULTURE, BLOOD (ROUTINE X 2)  RESPIRATORY PANEL BY PCR  LACTIC ACID, PLASMA  PROCALCITONIN     EKG:  EKG Interpretation Date/Time:  Sunday March 18 2024 00:42:09 EDT Ventricular Rate:  71 PR Interval:    QRS Duration:  150 QT Interval:  436 QTC Calculation: 473 R Axis:   -76  Text Interpretation: Ventricular-paced rhythm Abnormal ECG When compared with ECG of 12-Apr-2023 09:57, No significant change was found Confirmed by Neomi Neptune [phone]) on 03/18/2024 1:10:00 AM         RADIOLOGY: My personal review and interpretation of imaging: CT chest shows no pneumonia, edema.  I have personally reviewed all radiology reports.   CT Chest Wo Contrast Result Date: 03/18/2024 CLINICAL DATA:  Orthopnea EXAM: CT CHEST WITHOUT CONTRAST TECHNIQUE: Multidetector CT imaging of the chest was performed following the standard protocol without IV contrast. RADIATION DOSE REDUCTION: This exam  was performed according to the departmental  dose-optimization program which includes automated exposure control, adjustment of the mA and/or kV according to patient size and/or use of iterative reconstruction technique. COMPARISON:  Chest x-ray from earlier in the same day. FINDINGS: Cardiovascular: Atherosclerotic calcifications of the thoracic aorta are noted. Coronary calcifications are seen. Minimal pericardial effusion is noted. No cardiac enlargement is noted. Pacing device is seen. Mediastinum/Nodes: Thoracic inlet is within normal limits. No hilar or mediastinal adenopathy is noted. Esophagus as visualized is within normal limits. Lungs/Pleura: Lungs are well aerated bilaterally. No focal confluent infiltrate is seen. Scattered pulmonary nodules noted bilaterally. The largest of these measures 6 mm in dimension. No sizable effusion is seen. Upper Abdomen: Nonobstructing left renal stone is noted. No acute abnormality noted. Musculoskeletal: No chest wall mass or suspicious bone lesions identified. IMPRESSION: Multiple pulmonary nodules. Most significant: Solid pulmonary nodule measuring 6 mm. Per Fleischner Society Guidelines, recommend a non-contrast Chest CT at 3-6 months, then consider another non-contrast Chest CT at 18-24 months. If patient is low risk for malignancy, non-contrast Chest CT at 18-24 months is optional. These guidelines do not apply to immunocompromised patients and patients with cancer. Follow up in patients with significant comorbidities as clinically warranted. For lung cancer screening, adhere to Lung-RADS guidelines. Reference: Radiology. 2017; 284(1):228-43. Mild pericardial effusion. Electronically Signed   By: Oneil Devonshire M.D.   On: 03/18/2024 02:43   DG Chest 2 View Result Date: 03/18/2024 CLINICAL DATA:  Cough EXAM: CHEST - 2 VIEW COMPARISON:  02/08/2023 FINDINGS: Left pacer remains in place, unchanged. Cardiomegaly. No confluent opacities, effusions or edema. No acute bony  abnormality. IMPRESSION: Cardiomegaly.  No active disease. Electronically Signed   By: Franky Crease M.D.   On: 03/18/2024 01:06     PROCEDURES:  Critical Care performed: Yes, see critical care procedure note(s)   CRITICAL CARE Performed by: Josette Johney Perotti   Total critical care time: 30 minutes  Critical care time was exclusive of separately billable procedures and treating other patients.  Critical care was necessary to treat or prevent imminent or life-threatening deterioration.  Critical care was time spent personally by me on the following activities: development of treatment plan with patient and/or surrogate as well as nursing, discussions with consultants, evaluation of patient's response to treatment, examination of patient, obtaining history from patient or surrogate, ordering and performing treatments and interventions, ordering and review of laboratory studies, ordering and review of radiographic studies, pulse oximetry and re-evaluation of patient's condition.   SABRA1-3 Lead EKG Interpretation  Performed by: Doneisha Ivey, Josette SAILOR, DO Authorized by: Anchor Dwan, Josette SAILOR, DO     Interpretation: normal     ECG rate:  71   ECG rate assessment: normal     Rhythm: sinus rhythm     Ectopy: none     Conduction: normal       IMPRESSION / MDM / ASSESSMENT AND PLAN / ED COURSE  I reviewed the triage vital signs and the nursing notes.    Patient here for increased shortness of breath, tachypnea.  Denies chest pain.  Recently treated with doxycycline and prednisone for pneumonia.  The patient is on the cardiac monitor to evaluate for evidence of arrhythmia and/or significant heart rate changes.   DIFFERENTIAL DIAGNOSIS (includes but not limited to):   Pneumonia, viral URI, CHF exacerbation, bronchitis, bronchospasm, ACS, less likely pneumothorax   Patient's presentation is most consistent with acute presentation with potential threat to life or bodily function.   PLAN: Patient is  tachypneic and had initial oral temperature  of 94.5.  Meets SIRS criteria.  Will begin septic workup with labs, cultures, chest x-ray, urine.  She does have some mild wheezing on exam but denies history of tobacco use, asthma or COPD.  Will give a breathing treatment.  No hypoxia but due to work of breathing and abnormal vital signs, anticipate admission.   MEDICATIONS GIVEN IN ED: Medications  vancomycin (VANCOCIN) IVPB 1000 mg/200 mL premix (1,000 mg Intravenous New Bag/Given 03/18/24 0351)  ipratropium-albuterol  (DUONEB) 0.5-2.5 (3) MG/3ML nebulizer solution 3 mL (has no administration in time range)  lactated ringers  bolus 500 mL (0 mLs Intravenous Stopped 03/18/24 0251)  ceFEPIme (MAXIPIME) 2 g in sodium chloride  0.9 % 100 mL IVPB (0 g Intravenous Stopped 03/18/24 0206)  metroNIDAZOLE (FLAGYL) IVPB 500 mg (0 mg Intravenous Stopped 03/18/24 0348)  aspirin chewable tablet 324 mg (243 mg Oral Given 03/18/24 0141)  ipratropium-albuterol  (DUONEB) 0.5-2.5 (3) MG/3ML nebulizer solution 3 mL (3 mLs Nebulization Given 03/18/24 0158)     ED COURSE: Leukocytosis of 14,000.  This may be related to infection or recent prednisone use.  Troponin minimally elevated in the 70s but she denies any chest pain and EKG shows no new ischemic change.  Will give full dose aspirin and repeat in 2 hours.  Age-adjusted D-dimer is negative.  BNP only minimally elevated in the 160s.  Chest x-ray reviewed and interpreted by myself and the radiologist and shows no acute abnormality.  Will obtain noncontrast CT of the chest for further evaluation.  Patient allergic to contrast.  Will also send respiratory viral panel for further evaluation for possible viral upper respiratory infection causing her symptoms.  COVID, flu and RSV negative.   CT scan reviewed and interpreted by myself and the radiologist and shows multiple pulmonary nodules.  No edema, infiltrate, effusion.  Work of breathing has improved somewhat but she is  still mildly tachypneic.  Will discuss with hospitalist for admission.  Procalcitonin has come back elevated at 1.54.  Suspect possible developing bacterial pneumonia versus viral URI causing bronchospasm versus CHF exacerbation.  She did receive 500 mL of IV fluids.  Will hold any additional IV fluids here.  Normal blood pressure, normal lactic.   5:17 AM  Pt's respiratory viral panel is positive for rhinovirus which is likely the cause of her symptoms today.  Hospitalist aware.  CONSULTS:  Consulted and discussed patient's case with hospitalist, Dr. Cleatus.  I have recommended admission and consulting physician agrees and will place admission orders.  Patient (and family if present) agree with this plan.   I reviewed all nursing notes, vitals, pertinent previous records.  All labs, EKGs, imaging ordered have been independently reviewed and interpreted by myself.    OUTSIDE RECORDS REVIEWED: Reviewed PCP note on 03/07/2024.       FINAL CLINICAL IMPRESSION(S) / ED DIAGNOSES   Final diagnoses:  Shortness of breath  SIRS (systemic inflammatory response syndrome) (HCC)  Multiple pulmonary nodules  Wheezing     Rx / DC Orders   ED Discharge Orders     None        Note:  This document was prepared using Dragon voice recognition software and may include unintentional dictation errors.   Allisen Pidgeon, Josette SAILOR, DO 03/18/24 0419    Matia Zelada, Josette SAILOR, DO 03/18/24 [phone]

## 2024-03-18 NOTE — ED Notes (Addendum)
 This RN called to bedside by pts daughter. Pt daughter states She is in there trying to pull everything off, her IV out and trying to get out of the bed. This RN at bedside. Pt noted to be extremely agitated. Sage NT and this RN assisted pt with readjusting in bed and attempting to become comfortable. No improvement noted.Pulling at gown, IV lines, cardiac monitor. Continuously stating I got to get out of here. I just wanna go. Dr. Cleatus made aware via secure chat and order for Haldol placed. Pt given Haldol.

## 2024-03-18 NOTE — ED Notes (Signed)
Pt taken off of bed pan at this time.

## 2024-03-18 NOTE — Progress Notes (Signed)
 PHARMACY - ANTICOAGULATION CONSULT NOTE  Pharmacy Consult for  Eliquis   Indication: atrial fibrillation  Allergies  Allergen Reactions   Codeine Shortness Of Breath and Other (See Comments)   Farxiga  [Dapagliflozin ] Other (See Comments)    Seizure like activity   Iodinated Contrast Media Shortness Of Breath and Other (See Comments)   Morphine Shortness Of Breath and Other (See Comments)   Other Shortness Of Breath, Nausea And Vomiting and Other (See Comments)    Contrast dye   Oxycodone Shortness Of Breath   Shellfish Protein-Containing Drug Products Shortness Of Breath, Nausea And Vomiting and Rash   Dipyridamole Other (See Comments)    Unknown   Iodine Other (See Comments)   Omeprazole Other (See Comments)   Oxycodone-Acetaminophen  Other (See Comments)   Statins Other (See Comments)    Unknown   Sulfa Antibiotics Other (See Comments)    Other reaction(s): Unknown   Sulfonamide Derivatives Other (See Comments)    Unknown   Amlodipine  Rash and Other (See Comments)    Patient Measurements: Height: 5' (152.4 cm) Weight: 72.6 kg (160 lb) IBW/kg (Calculated) : 45.5 HEPARIN DW (KG): 61.6  Vital Signs: Temp: 98.5 F (36.9 C) (10/12 0120) Temp Source: Rectal (10/12 0120) BP: 141/87 (10/12 0151) Pulse Rate: 77 (10/12 0151)  Labs: Recent Labs    03/18/24 0032  HGB 13.4  HCT 40.9  PLT 311  CREATININE 1.98*  TROPONINIHS 76*    Estimated Creatinine Clearance: 17.8 mL/min (A) (by C-G formula based on SCr of 1.98 mg/dL (H)).   Medical History: Past Medical History:  Diagnosis Date   Abnormal echocardiogram 09/2009   EF >55%, mild LVH, moderate LAE, normal RV, normal PA systolic pressure   Allergic to IV contrast    Chronic diastolic CHF (congestive heart failure) (HCC)    Complete heart block-S./P. AV junction ablation 11/23/2010   Edentulous    Top.  Has most of bottom teeth   GERD (gastroesophageal reflux disease)    Hyperlipidemia    Has been unable to take  statins due to muscle pain. Has tried multiple statins per her report.   Hypertension    Hypothyroidism    Migraine    none recently   OSA (obstructive sleep apnea)    Not consistently using CPAP   Pacemaker-Medtronic CRT P. 11/23/2010   Atrial port was plugged [facility] 12/22/18)   PAD (peripheral artery disease)    Occlusive disease involving left PT and AT   Permanent atrial fibrillation (HCC)    Uses wheelchair    Able to stand and self transfer.    Medications:  (Not in a hospital admission)   Assessment: Pharmacy consulted to dose Eliquis  for Afib in this 89 year old female.   Pt was on Eliquis  2.5 mg PO BID PTA, last dose on 10/11 PM.  SrCr = 1.98 TBW = 72.6 kg  Goal of Therapy:  Prevention of thromboembolism    Plan:  Will resume Eliquis  2.5 mg PO BID to start on 10/12 @ 1000.   Jacqueline Lozano D 03/18/2024,4:54 AM

## 2024-03-18 NOTE — Assessment & Plan Note (Signed)
 No acute issues.

## 2024-03-18 NOTE — Assessment & Plan Note (Signed)
CPAP nightly if desired °

## 2024-03-18 NOTE — Assessment & Plan Note (Signed)
 No acute issues suspected

## 2024-03-19 ENCOUNTER — Encounter: Payer: Self-pay | Admitting: Internal Medicine

## 2024-03-19 DIAGNOSIS — E860 Dehydration: Secondary | ICD-10-CM | POA: Diagnosis present

## 2024-03-19 DIAGNOSIS — I5033 Acute on chronic diastolic (congestive) heart failure: Secondary | ICD-10-CM | POA: Diagnosis present

## 2024-03-19 DIAGNOSIS — I4821 Permanent atrial fibrillation: Secondary | ICD-10-CM | POA: Diagnosis present

## 2024-03-19 DIAGNOSIS — I3139 Other pericardial effusion (noninflammatory): Secondary | ICD-10-CM | POA: Diagnosis present

## 2024-03-19 DIAGNOSIS — B9789 Other viral agents as the cause of diseases classified elsewhere: Secondary | ICD-10-CM | POA: Diagnosis present

## 2024-03-19 DIAGNOSIS — Z91041 Radiographic dye allergy status: Secondary | ICD-10-CM | POA: Diagnosis not present

## 2024-03-19 DIAGNOSIS — G4733 Obstructive sleep apnea (adult) (pediatric): Secondary | ICD-10-CM | POA: Diagnosis present

## 2024-03-19 DIAGNOSIS — N179 Acute kidney failure, unspecified: Secondary | ICD-10-CM | POA: Diagnosis present

## 2024-03-19 DIAGNOSIS — I130 Hypertensive heart and chronic kidney disease with heart failure and stage 1 through stage 4 chronic kidney disease, or unspecified chronic kidney disease: Secondary | ICD-10-CM | POA: Diagnosis present

## 2024-03-19 DIAGNOSIS — I350 Nonrheumatic aortic (valve) stenosis: Secondary | ICD-10-CM | POA: Diagnosis present

## 2024-03-19 DIAGNOSIS — Z79890 Hormone replacement therapy (postmenopausal): Secondary | ICD-10-CM | POA: Diagnosis not present

## 2024-03-19 DIAGNOSIS — J189 Pneumonia, unspecified organism: Secondary | ICD-10-CM | POA: Diagnosis present

## 2024-03-19 DIAGNOSIS — Z950 Presence of cardiac pacemaker: Secondary | ICD-10-CM | POA: Diagnosis not present

## 2024-03-19 DIAGNOSIS — E039 Hypothyroidism, unspecified: Secondary | ICD-10-CM | POA: Diagnosis present

## 2024-03-19 DIAGNOSIS — Z1152 Encounter for screening for COVID-19: Secondary | ICD-10-CM | POA: Diagnosis not present

## 2024-03-19 DIAGNOSIS — Z8249 Family history of ischemic heart disease and other diseases of the circulatory system: Secondary | ICD-10-CM | POA: Diagnosis not present

## 2024-03-19 DIAGNOSIS — Z5900 Homelessness unspecified: Secondary | ICD-10-CM | POA: Diagnosis not present

## 2024-03-19 DIAGNOSIS — I442 Atrioventricular block, complete: Secondary | ICD-10-CM | POA: Diagnosis present

## 2024-03-19 DIAGNOSIS — R0602 Shortness of breath: Secondary | ICD-10-CM | POA: Diagnosis present

## 2024-03-19 DIAGNOSIS — E785 Hyperlipidemia, unspecified: Secondary | ICD-10-CM | POA: Diagnosis present

## 2024-03-19 DIAGNOSIS — F0390 Unspecified dementia without behavioral disturbance: Secondary | ICD-10-CM | POA: Diagnosis present

## 2024-03-19 DIAGNOSIS — I5032 Chronic diastolic (congestive) heart failure: Secondary | ICD-10-CM | POA: Diagnosis not present

## 2024-03-19 DIAGNOSIS — Z800 Family history of malignant neoplasm of digestive organs: Secondary | ICD-10-CM | POA: Diagnosis not present

## 2024-03-19 DIAGNOSIS — Z79899 Other long term (current) drug therapy: Secondary | ICD-10-CM | POA: Diagnosis not present

## 2024-03-19 DIAGNOSIS — I739 Peripheral vascular disease, unspecified: Secondary | ICD-10-CM | POA: Diagnosis present

## 2024-03-19 DIAGNOSIS — Z7901 Long term (current) use of anticoagulants: Secondary | ICD-10-CM | POA: Diagnosis not present

## 2024-03-19 LAB — BASIC METABOLIC PANEL WITH GFR
Anion gap: 10 (ref 5–15)
BUN: 53 mg/dL — ABNORMAL HIGH (ref 8–23)
CO2: 23 mmol/L (ref 22–32)
Calcium: 9.4 mg/dL (ref 8.9–10.3)
Chloride: 103 mmol/L (ref 98–111)
Creatinine, Ser: 2.07 mg/dL — ABNORMAL HIGH (ref 0.44–1.00)
GFR, Estimated: 23 mL/min — ABNORMAL LOW (ref >60)
Glucose, Bld: 96 mg/dL (ref 70–99)
Potassium: 4.8 mmol/L (ref 3.5–5.1)
Sodium: 136 mmol/L (ref 135–145)

## 2024-03-19 LAB — PHOSPHORUS: Phosphorus: 4.3 mg/dL (ref 2.5–4.6)

## 2024-03-19 LAB — CBC
HCT: 37 % (ref 36.0–46.0)
Hemoglobin: 12.2 g/dL (ref 12.0–15.0)
MCH: 30.5 pg (ref 26.0–34.0)
MCHC: 33 g/dL (ref 30.0–36.0)
MCV: 92.5 fL (ref 80.0–100.0)
Platelets: 236 K/uL (ref 150–400)
RBC: 4 MIL/uL (ref 3.87–5.11)
RDW: 14.4 % (ref 11.5–15.5)
WBC: 18.2 K/uL — ABNORMAL HIGH (ref 4.0–10.5)
nRBC: 0 % (ref 0.0–0.2)

## 2024-03-19 LAB — MAGNESIUM: Magnesium: 2.5 mg/dL — ABNORMAL HIGH (ref 1.7–2.4)

## 2024-03-19 MED ORDER — TRAZODONE HCL 50 MG PO TABS
50.0000 mg | ORAL_TABLET | Freq: Every evening | ORAL | Status: DC | PRN
  Administered 2024-03-19 – 2024-03-23 (×5): 50 mg via ORAL
  Filled 2024-03-19 (×5): qty 1

## 2024-03-19 MED ORDER — ALPRAZOLAM 0.25 MG PO TABS
0.2500 mg | ORAL_TABLET | Freq: Two times a day (BID) | ORAL | Status: AC
  Administered 2024-03-19 (×2): 0.25 mg via ORAL
  Filled 2024-03-19 (×2): qty 1

## 2024-03-19 MED ORDER — SODIUM CHLORIDE 0.9 % IV SOLN: INTRAVENOUS | Status: AC

## 2024-03-19 MED ORDER — ALPRAZOLAM 0.25 MG PO TABS
0.2500 mg | ORAL_TABLET | Freq: Two times a day (BID) | ORAL | Status: DC
Start: 2024-03-19 — End: 2024-03-19

## 2024-03-19 MED ORDER — QUETIAPINE FUMARATE 25 MG PO TABS
50.0000 mg | ORAL_TABLET | Freq: Two times a day (BID) | ORAL | Status: DC
Start: 2024-03-19 — End: 2024-03-20
  Administered 2024-03-19 – 2024-03-20 (×3): 50 mg via ORAL
  Filled 2024-03-19 (×3): qty 2

## 2024-03-19 MED ORDER — ENSURE PLUS HIGH PROTEIN PO LIQD
237.0000 mL | Freq: Two times a day (BID) | ORAL | Status: DC
  Administered 2024-03-19 – 2024-03-21 (×4): 237 mL via ORAL

## 2024-03-19 MED ORDER — HALOPERIDOL LACTATE 5 MG/ML IJ SOLN
2.0000 mg | Freq: Four times a day (QID) | INTRAMUSCULAR | Status: DC | PRN
  Filled 2024-03-19: qty 1

## 2024-03-19 NOTE — Evaluation (Signed)
 Clinical/Bedside Swallow Evaluation Patient Details  Name: ROBBYN HODKINSON MRN: [id number] Date of Birth: [birth year]  Today's Date: 03/19/2024 Time: SLP Start Time (ACUTE ONLY): 1550 SLP Stop Time (ACUTE ONLY): 1650 SLP Time Calculation (min) (ACUTE ONLY): 60 min  Past Medical History:  Past Medical History:  Diagnosis Date   Abnormal echocardiogram 09/2009   EF >55%, mild LVH, moderate LAE, normal RV, normal PA systolic pressure   Allergic to IV contrast    Chronic diastolic CHF (congestive heart failure) (HCC)    Complete heart block-S./P. AV junction ablation 11/23/2010   Edentulous    Top.  Has most of bottom teeth   GERD (gastroesophageal reflux disease)    Hyperlipidemia    Has been unable to take statins due to muscle pain. Has tried multiple statins per her report.   Hypertension    Hypothyroidism    Migraine    none recently   OSA (obstructive sleep apnea)    Not consistently using CPAP   Pacemaker-Medtronic CRT P. 11/23/2010   Atrial port was plugged [facility] 12/22/18)   PAD (peripheral artery disease)    Occlusive disease involving left PT and AT   Permanent atrial fibrillation (HCC)    Uses wheelchair    Able to stand and self transfer.   Past Surgical History:  Past Surgical History:  Procedure Laterality Date   APPENDECTOMY     ATRIAL ABLATION SURGERY  06/12/2010   BIV PACEMAKER GENERATOR CHANGEOUT N/A 12/22/2018   Procedure: BIV PACEMAKER GENERATOR CHANGEOUT;  Surgeon: Fernande Elspeth BROCKS, MD;  Location: [facility] INVASIVE CV LAB;  Service: Cardiovascular;  Laterality: N/A;   CARDIAC CATHETERIZATION     Left heart cath x3 in teh past, last 5 yrs ago. All normal per pt report   CARDIOVERSION     CARPAL TUNNEL RELEASE     CATARACT EXTRACTION W/PHACO Right 10/29/2019   Procedure: CATARACT EXTRACTION PHACO AND INTRAOCULAR LENS PLACEMENT (IOC) RIGHT 3.87 00:41.2;  Surgeon: Myrna Adine Anes, MD;  Location: [facility];  Service: Ophthalmology;  Laterality:  Right;  sleep apnea   CATARACT EXTRACTION W/PHACO Left 12/03/2019   Procedure: CATARACT EXTRACTION PHACO AND INTRAOCULAR LENS PLACEMENT (IOC) LEFT 6.02  00:51.3;  Surgeon: Myrna Adine Anes, MD;  Location: [facility] SURGERY CNTR;  Service: Ophthalmology;  Laterality: Left;  sleep apnea   CHOLECYSTECTOMY OPEN     COLONOSCOPY WITH PROPOFOL  N/A 09/08/2015   Procedure: COLONOSCOPY WITH PROPOFOL ;  Surgeon: Donnice Vaughn Manes, MD;  Location: [facility] ENDOSCOPY;  Service: Endoscopy;  Laterality: N/A;   ESOPHAGOGASTRODUODENOSCOPY (EGD) WITH PROPOFOL  N/A 09/08/2015   Procedure: ESOPHAGOGASTRODUODENOSCOPY (EGD) WITH PROPOFOL ;  Surgeon: Donnice Vaughn Manes, MD;  Location: [facility] ENDOSCOPY;  Service: Endoscopy;  Laterality: N/A;   PACEMAKER INSERTION  06/12/2010   VESICOVAGINAL FISTULA CLOSURE W/ TAH     HPI:  Pt is a 89 y.o. female with medical history significant for Cognitive Impairment/decline per chart, permanent pacemaker, A-fib s/p ablation on Eliquis , complete heart block s/p PPM , HTN, HFpEF, hypothyroidism, aortic valve stenosis being admitted with community-acquired pneumonia with a possible element of CHF.  She presented with shortness of breath, worse on lying flat in the setting of recently completing a course of antibiotics for UTI.  She has a nonproductive cough without fever or chills.  Of late has been sleeping a lot -- 20 hours of the day per Daughter.  Per chart notes, pt has history of Cognitive impairment. Baseline mobility reported as mod with occasional assistant provided from daughter but dependent for  community mobility using a wheelchair,currently requiring modA x2 at times for room mobility..  Pt lived alone w/ Daughter checking on her until 3 weeks ago; pt is now living w/ the Daughter d/t level of care and confusion noted.    Assessment / Plan / Recommendation  Clinical Impression   Pt seen for BSE this pm. Pt was drowsy/sleepy d/t recent Medication(sedating effect) d/t agitated behaviors  per the Daughter(present). Pt was drowsy and required MOD++ verbal/tactile cues for alerting for po tasks. She required FULL support for feeding also. She was alert to name. She does not have an Upper denture plate; few bottom Dentition.  On RA, afebrile.  Pt appears to present w/ grossly functional oropharyngeal phase swallowing w/ No overt, gross oropharyngeal phase dysphagia noted, No overt neuromuscular deficits noted. Pt consumed po trials w/ No immediate, overt clinical s/s of aspiration during the po trials.  Pt has Baseline Cognitive Impairment per chart notes. ANY Cognitive decline can impact her overall awareness/timing of swallowing and overall safety during po tasks which increases risk for aspiration, choking. Pt's risk for aspiration can be reduced when following general aspiration precautions and using a modified diet consistency of broken down foods d/t lacking Upper Dentition(baseline). Pt also required MOD+ assistance at meals for support, safety. Other impacting factors on pt's swallowing success are sedating Medications, deconditioning/fatigue/weakness, need for support w/ feeding and positioning upright for po's, and advanced age. These factors and Cognitive Impairment can increase risk for dysphagia as well as decreased oral intake overall.   During po trials, pt consumed consistencies w/ no overt coughing, decline in vocal quality, or change in respiratory presentation during/post trials. No decline in O2 sats during po's. Oral phase appeared grossly [facility] w/ timely bolus management, mashing/gumming/mastication, and control of bolus propulsion for A-P transfer for swallowing. Oral clearing achieved w/ all trial consistencies given min time(no upper denture plate) -- moistened, soft foods given for ease of gumming/chewing. OM Exam was cursory but appeared [facility] w/ no unilateral weakness noted. Speech intelligible.  Pt required FULL feeding support and setup/positioning.   Recommend a more  Mech Soft consistency diet w/ well-Cut meats, moistened foods; Thin liquids -- carefully monitor straw use(pinched straw), and pt should help to Hold Cup/bottle when drinking. ONLY give po's when pt is FULLY awake/alert to participate. Recommend general aspiration precautions including small sips/bites slowly; give time to clear orally b/t bites/sips. Pills WHOLE vs CRUSHED in Puree for safer, easier swallowing -- it was encouraged now and for D/C to the Dtr. Feeding support as needed at meals.   Education given on Pills in Puree; food consistencies and easy to eat options; general aspiration precautions; impact of Cognition on swallowing and general oral intake to pt and Dtr. ST services will monitor pt's status while admitted; education for Daughter(caregiver to pt). NSG updated, agreed. MD updated. Recommend Dietician f/u for support. Palliative Care f/u at D/C for support and GOC overall.  SLP Visit Diagnosis: Dysphagia, unspecified (R13.10) (Baseline Cognitive Impairment/decline; Missing Dentition; need for FULL support w/ po feeding currently; need for sedating Medication d/t agitation)    Aspiration Risk  Mild aspiration risk;Risk for inadequate nutrition/hydration (reduced when following general aspiration precautions)    Diet Recommendation   Thin;Dysphagia 3 (mechanical soft) (gravies) = a more Mech Soft consistency diet w/ well-Cut meats, moistened foods; Thin liquids -- carefully monitor straw use(pinched straw), and pt should help to Hold Cup/bottle when drinking. ONLY give po's when pt is FULLY awake/alert to participate. Recommend general  aspiration precautions including small sips/bites slowly; give time to clear orally b/t bites/sips. Feeding support as needed at meals.   Medication Administration: Whole meds with puree (Crushed in puree)    Other  Recommendations Recommended Consults:  (Dietician f/u; Palliative Care f/u for GOC at d/c) Oral Care Recommendations: Oral care BID;Oral  care before and after PO;Staff/trained caregiver to provide oral care     Assistance Recommended at Discharge  FULL d/t Cognitive decline  Functional Status Assessment Patient has had a recent decline in their functional status and demonstrates the ability to make significant improvements in function in a reasonable and predictable amount of time.  Frequency and Duration min 1 x/week  1 week       Prognosis Prognosis for improved oropharyngeal function: Fair (-Good) Barriers to Reach Goals: Cognitive deficits;Time post onset;Severity of deficits;Behavior Barriers/Prognosis Comment: Baseline Cognitive Impairment/decline; Missing Dentition; need for FULL support w/ po feeding currently; need for sedating Medication d/t agitation      Swallow Study   General Date of Onset: 03/18/24 HPI: Pt is a 89 y.o. female with medical history significant for Cognitive Impairment/decline per chart, permanent pacemaker, A-fib s/p ablation on Eliquis , complete heart block s/p PPM , HTN, HFpEF, hypothyroidism, aortic valve stenosis being admitted with community-acquired pneumonia with a possible element of CHF.  She presented with shortness of breath, worse on lying flat in the setting of recently completing a course of antibiotics for UTI.  She has a nonproductive cough without fever or chills.  Of late has been sleeping a lot -- 20 hours of the day per Daughter.  Per chart notes, pt has history of Cognitive impairment. Baseline mobility reported as mod with occasional assistant provided from daughter but dependent for community mobility using a wheelchair,currently requiring modA x2 at times for room mobility..  Pt lived alone w/ Daughter checking on her until 3 weeks ago; pt is now living w/ the Daughter d/t level of care and confusion noted. Type of Study: Bedside Swallow Evaluation Previous Swallow Assessment: none Diet Prior to this Study: Regular;Thin liquids (Level 0) Temperature Spikes Noted: No (wbc  elevated) Respiratory Status: Room air History of Recent Intubation: No Behavior/Cognition: Cooperative;Pleasant mood;Confused;Lethargic/Drowsy;Requires cueing (MOD+) Oral Cavity Assessment: Within Functional Limits Oral Care Completed by SLP: Recent completion by staff Oral Cavity - Dentition: Missing dentition (no upper plate) Vision:  (wfl) Self-Feeding Abilities: Needs assist;Needs set up;Total assist (drowsy/sleepy) Patient Positioning: Upright in bed (FULL assist) Baseline Vocal Quality: Low vocal intensity Volitional Cough: Cognitively unable to elicit Volitional Swallow: Unable to elicit    Oral/Motor/Sensory Function Overall Oral Motor/Sensory Function: Within functional limits (during bolus management)   Ice Chips Ice chips: Within functional limits Presentation: Spoon (fed; 3 trials)   Thin Liquid Thin Liquid: Within functional limits Presentation: Cup;Self Fed;Straw (FULLY supported; 10 trials total)    Nectar Thick Nectar Thick Liquid: Not tested   Honey Thick Honey Thick Liquid: Not tested   Puree Puree: Within functional limits Presentation: Spoon (fed; 3 trials)   Solid     Solid: Impaired (grossly wfl but missing dentition and drowsy) Presentation: Spoon (fed; 4 trials) Oral Phase Impairments: Impaired mastication;Poor awareness of bolus (min) Pharyngeal Phase Impairments:  (none) Other Comments: increased time          Comer Portugal, MS, CCC-SLP Speech Language Pathologist Rehab Services; [facility] -  [phone] (ascom) Doniesha Landau 03/19/2024,5:33 PM

## 2024-03-19 NOTE — Plan of Care (Signed)

## 2024-03-19 NOTE — Care Management Obs Status (Signed)
 MEDICARE OBSERVATION STATUS NOTIFICATION   Patient Details  Name: Jacqueline Lozano MRN: [id number] Date of Birth: Oct 15, 1934   Medicare Observation Status Notification Given:  Yes    Rojelio [name] 03/19/2024, 11:19 AM

## 2024-03-19 NOTE — Evaluation (Signed)
 Physical Therapy Evaluation Patient Details Name: Jacqueline Lozano MRN: [id number] DOB: July 22, 1934 Today's Date: 03/19/2024  History of Present Illness  Jacqueline Lozano is a 89 y.o. female with medical history significant for Permanent A-fib s/p ablation on Eliquis , complete heart block s/p PPM , HTN, HFpEF, hypothyroidism, aortic valve stenosis being admitted with community-acquired pneumonia with a possible element of CHF.  Clinical Impression  Patient seen for initial PT evaluation due to decline in functional mobility. Pt has history of cognitive impairment. Baseline mobility reported as modI with occasional assistant provided from daughter but dependent for community mobility using a wheelchair,currently requiring modA x2 at times for room mobility. Gait assessed with RW requiring ModA x2 with RW, limited by fatigue. Pt lived alone with one STE and assistance is provided PRN from daugther. Clinical impression: patient presents with moderate to servere mobility limitations secondary to acute medical complications and chronic sedentary lifestyle. Recommend skilled PT to address safety, mobility, and discharge planning.           If plan is discharge home, recommend the following: A lot of help with walking and/or transfers;A lot of help with bathing/dressing/bathroom   Can travel by private vehicle   No    Equipment Recommendations None recommended by PT  Recommendations for Other Services       Functional Status Assessment Patient has had a recent decline in their functional status and demonstrates the ability to make significant improvements in function in a reasonable and predictable amount of time.     Precautions / Restrictions Precautions Precautions: Fall Recall of Precautions/Restrictions: Impaired Restrictions Weight Bearing Restrictions Per Provider Order: No      Mobility  Bed Mobility                    Transfers Overall transfer level: Needs  assistance Equipment used: Rolling walker (2 wheels) Transfers: Sit to/from Stand, Bed to chair/wheelchair/BSC Sit to Stand: Mod assist           General transfer comment: pt able to stand form chair at bedside modA; PT entered the room during OT evaluation transitioning to chair and provided additional +1 for safety due to visible fatigue and LE weakness    Ambulation/Gait Ambulation/Gait assistance: Mod assist, +2 physical assistance Gait Distance (Feet): 3 Feet Assistive device: Rolling walker (2 wheels) Gait Pattern/deviations: Step-to pattern, Shuffle, Trunk flexed, Narrow base of support       General Gait Details: vc for movement sequence hand placement and verbal encouragement due to pt. fear of movement  Stairs            Wheelchair Mobility     Tilt Bed    Modified Rankin (Stroke Patients Only)       Balance Overall balance assessment: Needs assistance Sitting-balance support: Feet supported, Bilateral upper extremity supported Sitting balance-Leahy Scale: Fair     Standing balance support: During functional activity Standing balance-Leahy Scale: Poor                               Pertinent Vitals/Pain Pain Assessment Breathing: normal Negative Vocalization: none Facial Expression: sad, frightened, frown Body Language: relaxed Consolability: no need to console PAINAD Score: 1    Home Living Family/patient expects to be discharged to:: Private residence Living Arrangements: Alone Available Help at Discharge: Family Type of Home: House Home Access: Stairs to enter Entrance Stairs-Rails: None Entrance Stairs-Number of Steps: 1 threshold step  Home Layout: One level Home Equipment: Rollator (4 wheels);Electric scooter;Grab bars - tub/shower;Shower seat - built in;Wheelchair - manual;BSC/3in1 Additional Comments: Info above for pt's personal home. Past 3 weeks pt living with her daughter with ramp access, one level and bird baths  at baseline    Prior Function                       Extremity/Trunk Assessment        Lower Extremity Assessment Lower Extremity Assessment: Generalized weakness    Cervical / Trunk Assessment Cervical / Trunk Assessment: Normal  Communication   Communication Communication: Impaired Factors Affecting Communication: Hearing impaired    Cognition Arousal: Alert Behavior During Therapy: WFL for tasks assessed/performed   PT - Cognitive impairments: History of cognitive impairments                         Following commands: Impaired Following commands impaired: Follows one step commands with increased time, Follows one step commands inconsistently     Cueing Cueing Techniques: Verbal cues, Tactile cues, Visual cues, Gestural cues     General Comments      Exercises     Assessment/Plan    PT Assessment Patient needs continued PT services  PT Problem List Decreased strength;Decreased activity tolerance;Decreased mobility;Decreased balance;Decreased cognition;Decreased knowledge of precautions       PT Treatment Interventions Gait training;Functional mobility training;Therapeutic activities;Therapeutic exercise;Balance training;Neuromuscular re-education;Patient/family education    PT Goals (Current goals can be found in the Care Plan section)  Acute Rehab PT Goals PT Goal Formulation: Patient unable to participate in goal setting Time For Goal Achievement: 04/02/24 Potential to Achieve Goals: Fair    Frequency Min 3X/week     Co-evaluation               AM-PAC PT 6 Clicks Mobility  Outcome Measure                  End of Session Equipment Utilized During Treatment: Gait belt Activity Tolerance: Patient limited by fatigue Patient left: in chair;with chair alarm set Nurse Communication: Mobility status PT Visit Diagnosis: Unsteadiness on feet (R26.81);Other abnormalities of gait and mobility (R26.89);Muscle weakness  (generalized) (M62.81)    Time: 9067-9054 PT Time Calculation (min) (ACUTE ONLY): 13 min   Charges:   PT Evaluation $PT Eval Moderate Complexity: 1 Mod   PT General Charges $$ ACUTE PT VISIT: 1 Visit         Sherlean Lesches DPT, PT    Jacqueline Lozano 03/19/2024, 10:01 AM

## 2024-03-19 NOTE — TOC CM/SW Note (Signed)
 Transition of Care [facility]) CM/SW Note    Transition of Care [facility]) - Inpatient Brief Assessment   Patient Details  Name: SHATORA WEATHERBEE MRN: [id number] Date of Birth: Feb 07, 1935  Transition of Care [facility]) CM/SW Contact:    Alfonso Rummer, LCSW Phone Number: 03/19/2024, 9:31 AM   Clinical Narrative: KEN DELENA Rummer completed TOC chart review. TOC awaiting pt/ot evaluation. TOC will follow for safe discharge.    Transition of Care Asessment: Insurance and Status: Insurance coverage has been reviewed Patient has primary care physician: Yes (HENDRICK, JAMES F) Home environment has been reviewed: Single family home     Social Drivers of Health Review: SDOH reviewed no interventions necessary Readmission risk has been reviewed: No Transition of care needs: transition of care needs identified, TOC will continue to follow

## 2024-03-19 NOTE — Progress Notes (Signed)
 Heart Failure Nurse Navigator Progress Note  PCP: Stanton Lynwood FALCON, MD PCP-Cardiologist: Evalene Lunger, MD Admission Diagnosis: Shortness of breath SIRS (systemic response syndrome) (HCC) Multiple pulmonary nodules Wheezing Admitted from: Home  Presentation:   Jacqueline Lozano is a 89 y.o. female who presented with complaints of shortness of breath. She has a history of complete heart block status post pacemaker, diastolic heart failure, hypertension, hyperlipidemia, atrial fibrillation on Eliquis  and  hypothyroidism.  She recently finished doxycycline and prednisone for possible pneumonia and Cipro for UTI.  BNP 163.6. HS-Troponin 76. Chest x-ray: Cardiomegaly.  ECHO/ LVEF: 55-60%  Clinical Course:  Past Medical History:  Diagnosis Date   Abnormal echocardiogram 09/2009   EF >55%, mild LVH, moderate LAE, normal RV, normal PA systolic pressure   Allergic to IV contrast    Chronic diastolic CHF (congestive heart failure) (HCC)    Complete heart block-S./P. AV junction ablation 11/23/2010   Edentulous    Top.  Has most of bottom teeth   GERD (gastroesophageal reflux disease)    Hyperlipidemia    Has been unable to take statins due to muscle pain. Has tried multiple statins per her report.   Hypertension    Hypothyroidism    Migraine    none recently   OSA (obstructive sleep apnea)    Not consistently using CPAP   Pacemaker-Medtronic CRT P. 11/23/2010   Atrial port was plugged [facility] 12/22/18)   PAD (peripheral artery disease)    Occlusive disease involving left PT and AT   Permanent atrial fibrillation (HCC)    Uses wheelchair    Able to stand and self transfer.     Social History   Socioeconomic History   Marital status: Widowed    Spouse name: Not on file   Number of children: Not on file   Years of education: Not on file   Highest education level: 12th grade  Occupational History   Not on file  Tobacco Use   Smoking status: Never   Smokeless tobacco:  Never   Tobacco comments:    pos smoke exposure through father and spouse who smoked in the home with her for many years  Vaping Use   Vaping status: Never Used  Substance and Sexual Activity   Alcohol use: No   Drug use: No   Sexual activity: Not on file  Other Topics Concern   Not on file  Social History Narrative   From [place]    Widowed   Lives alone   Social Drivers of Health   Financial Resource Strain: Low Risk  (01/13/2023)   Received from [facility]   Overall Financial Resource Strain (CARDIA)    Difficulty of Paying Living Expenses: Not hard at all  Food Insecurity: No Food Insecurity (03/18/2024)   Hunger Vital Sign    Worried About Running Out of Food in the Last Year: Never true    Ran Out of Food in the Last Year: Never true  Transportation Needs: No Transportation Needs (03/18/2024)   PRAPARE - Administrator, Civil Service (Medical): No    Lack of Transportation (Non-Medical): No  Physical Activity: Not on file  Stress: Not on file  Social Connections: Patient Declined (03/18/2024)   Social Connection and Isolation Panel    Frequency of Communication with Friends and Family: Patient declined    Frequency of Social Gatherings with Friends and Family: Patient declined    Attends Religious Services: Patient declined    Active Member of [employer]  or Organizations: Patient declined    Attends Banker Meetings: Patient declined    Marital Status: Patient declined   Education Assessment and Provision:  Daughter (Sherri) at the bedside for all Heart Failure Education.  Patient remained sleeping.  Detailed education and instructions provided on heart failure disease management including the following:  Signs and symptoms of Heart Failure When to call the physician Importance of daily weights Low sodium diet Fluid restriction Medication management Anticipated future follow-up appointments  Patient education given on  each of the above topics.  Patient acknowledges understanding via teach back method and acceptance of all instructions.  Education Materials:  Living Better With Heart Failure Booklet, HF zone tool, & Daily Weight Tracker Tool.  Patient has scale at home: Yes Patient has pill box at home: Yes.  Daughter Maeola fills patients pill boxes.    High Risk Criteria for Readmission and/or Poor Patient Outcomes: Heart failure hospital admissions (last 6 months): 0  No Show rate: 2% Difficult social situation: Hearing deficits and does not wear hearing aids. Demonstrates medication adherence: Yes Primary Language: English Literacy level: High school graduate.  Reading, Writing & Comprehension.  Barriers of Care:   Hearing impaired.  Does not wear hearing aids.  Considerations/Referrals:  Referral made to Heart Failure Pharmacist Stewardship: N/A Referral made to Heart Failure CSW/NCM TOC: No Referral made to Heart & Vascular TOC clinic: Yes.  [facility] AHF TOC 03/29/24 @ 2:30  Items for Follow-up on DC/TOC: Daily Weights Diet & Fluid Restrictions Continued Heart Failure Education  Charmaine Pines, RN, BSN [facility] Heart Failure Navigator Secure Chat Only

## 2024-03-19 NOTE — Progress Notes (Signed)
 [facility] Progress Note  Patient: Jacqueline Lozano    FMW:[id number]  DOA: 03/18/2024     Date of Service: the patient was seen and examined on 03/19/2024  Chief Complaint  Patient presents with   Cough   Fatigue   Brief hospital course: Jacqueline Lozano is a 89 y.o. female with medical history significant for Permanent A-fib s/p ablation on Eliquis , complete heart block s/p PPM , HTN, HFpEF, hypothyroidism, aortic valve stenosis being admitted with community-acquired pneumonia with a possible element of CHF.  She presented with shortness of breath, worse on lying flat in the setting of recently completing a course of antibiotics for UTI.  She has a nonproductive cough without fever or chills.  Of late has been sleeping a lot. In the ED, Slightly hypothermic at 94.5 with otherwise normal vitals Labs with troponin 76 and BNP 163 WBC 14.4 with lactic acid 1.6, procalcitonin 1.5 and negative respiratory viral panel BMP with creatinine 1.98 above baseline of 1.6 in April EKG showed a ventricular paced rhythm at 71 CT chest without contrast showing multiple pulmonary nodules most significant solid pulmonary nodule of 6 mm with recommendation for noncontrasted CT chest in 3 to 6 months Patient was treated with cefepime, Flagyl,vancomycin and LR bolus and DuoNeb also given chewable aspirin 324 Admission requested    Assessment and Plan:   # CAP (community acquired pneumonia) Rhinovirus +ve Rocephin and azithromycin Guaifenesin DuoNebs as needed Leukocytosis, procalcitonin 1.56 Continue antibiotics for now 10/12 Solu-Medrol 40 mg IV daily x 2 doses followed by prednisone 40 mg p.o. daily x 3 doses   # AKI on CKD stage IIb Baseline creatinine 1.5, eGFR 34, Discontinued low-dose Lasix  Started gentle hydration with IV fluids Bladder scan rule out urinary retention US  renal: Echogenic kidneys likely secondary to medical renal disease. Simple left renal cyst.   # Acute on  chronic heart failure with preserved ejection fraction (HFpEF) (HCC) Mild exacerbation Orthopnea with BNP 163.  CT chest without effusion or pulmonary vascular congestion S/p Low-dose IV Lasix , discontinued on 10/13 due to elevated creatinine Continue metoprolol    Incidental pulmonary nodule, > 3mm and < 8mm Follow-up per radiology with repeat CT scan in 3 to 6 months   Permanent atrial fibrillation (HCC) Chronic anticoagulation Continue Eliquis  and metoprolol    # Delirium Patient restless, agitated while in the emergency room Haldol administered, continue as needed Delirium precautions 10/13 as per patient's daughter Xanax did help her.  She was started Xanax 0.25 mg twice daily x 2 doses Started Seroquel 50 mg p.o. twice daily and trazodone as needed for sleep   Aortic valve stenosis No acute issues   OSA (obstructive sleep apnea) CPAP nightly if desired   Complete heart block s/p biventricular cardiac pacemaker No acute issues suspected    Body mass index is 31.56 kg/m.  Interventions:  Diet: Heart healthy diet DVT Prophylaxis: Eliquis   Advance goals of care discussion: DNR-limited  Family Communication: family was present at bedside, at the time of interview.  The pt provided permission to discuss medical plan with the family. Opportunity was given to ask question and all questions were answered satisfactorily.   Disposition:  Pt is from home, admitted with rhinovirus, pneumonia, AKI, AMS, still has AKI, which precludes a safe discharge. Discharge to homelessness, TBD after PT/OT eval, when stable, may need few days to improve.  Subjective: No significant events overnight, patient was pleasantly confused, AO x 1 at baseline secondary to dementia. Patient's daughter was at bedside,  management plan discussed.  Patient is unable to offer any complaints. Patient was trying to get out of the bed to get some water even though she had sips of water multiple times at bed  but her mind is focused on getting out of the bed to get a water bottle.  Physical Exam: General: NAD, lying comfortably Appear in no distress, affect appropriate Eyes: PERRLA ENT: Oral Mucosa Clear, moist  Neck: no JVD,  Cardiovascular: S1 and S2 Present, no Murmur,  Respiratory: good respiratory effort, Bilateral Air entry equal and Decreased, no Crackles, no wheezes Abdomen: Bowel Sound present, Soft and no tenderness,  Skin: no rashes Extremities: no Pedal edema, no calf tenderness Neurologic: without any new focal findings Gait not checked due to patient safety concerns  Vitals:   03/19/24 0425 03/19/24 0500 03/19/24 0908 03/19/24 1149  BP: (!) 151/69  130/61 (!) 141/62  Pulse: 70  69 70  Resp:      Temp:      TempSrc:      SpO2:   95% 98%  Weight:  73.3 kg    Height:        Intake/Output Summary (Last 24 hours) at 03/19/2024 1502 Last data filed at 03/19/2024 1408 Gross per 24 hour  Intake 360 ml  Output 400 ml  Net -40 ml   Filed Weights   03/18/24 0026 03/19/24 0500  Weight: 72.6 kg 73.3 kg    Data Reviewed: I have personally reviewed and interpreted daily labs, tele strips, imagings as discussed above. I reviewed all nursing notes, pharmacy notes, vitals, pertinent old records I have discussed plan of care as described above with RN and patient/family.  CBC: Recent Labs  Lab 03/18/24 0032 03/18/24 0820 03/19/24 0343  WBC 14.4* 17.2* 18.2*  HGB 13.4 12.5 12.2  HCT 40.9 38.2 37.0  MCV 92.7 91.0 92.5  PLT 311 316 236   Basic Metabolic Panel: Recent Labs  Lab 03/18/24 0032 03/18/24 0820 03/19/24 0343  NA 136 134* 136  K 4.7 4.6 4.8  CL 103 103 103  CO2 21* 21* 23  GLUCOSE 114* 165* 96  BUN 52* 44* 53*  CREATININE 1.98* 1.83* 2.07*  CALCIUM 9.4 9.3 9.4  MG  --  1.4* 2.5*  PHOS  --  3.1 4.3    Studies: ECHOCARDIOGRAM COMPLETE Result Date: 03/18/2024    ECHOCARDIOGRAM REPORT   Patient Name:   Jacqueline Lozano Date of Exam: 03/18/2024  Medical Rec #:  [id number]         Height:       60.0 in Accession #:    [id number]        Weight:       160.0 lb Date of Birth:  01/22/1935         BSA:          1.698 m Patient Age:    89 years          BP:           141/87 mmHg Patient Gender: F                 HR:           70 bpm. Exam Location:  [facility] Procedure: 2D Echo, Cardiac Doppler and Color Doppler (Both Spectral and Color            Flow Doppler were utilized during procedure). Indications:     CHF I50.31  History:  Patient has prior history of Echocardiogram examinations, most                  recent 03/10/2021.  Sonographer:     Thedora Louder RDCS, FASE Referring Phys:  [id number] DELAYNE LULLA SOLIAN Diagnosing Phys: Darryle Decent MD  Sonographer Comments: Technically difficult study due to poor echo windows. Image acquisition challenging due to respiratory motion. Image acquisition very challenging due to patient coughing throughout this exam. IMPRESSIONS  1. Left ventricular ejection fraction, by estimation, is 55 to 60%. The left ventricle has normal function. The left ventricle has no regional wall motion abnormalities. There is severe left ventricular hypertrophy. Left ventricular diastolic parameters  are indeterminate.  2. Right ventricular systolic function is mildly reduced. The right ventricular size is moderately enlarged. There is normal pulmonary artery systolic pressure. The estimated right ventricular systolic pressure is 31.1 mmHg.  3. Left atrial size was severely dilated.  4. Right atrial size was severely dilated.  5. A small pericardial effusion is present. The pericardial effusion is circumferential. There is no evidence of cardiac tamponade.  6. The mitral valve is grossly normal. Trivial mitral valve regurgitation. No evidence of mitral stenosis.  7. The aortic valve is tricuspid. There is mild calcification of the aortic valve. There is mild thickening of the aortic valve. Aortic valve regurgitation is not visualized. Aortic  valve sclerosis/calcification is present, without any evidence of aortic stenosis.  8. The inferior vena cava is normal in size with greater than 50% respiratory variability, suggesting right atrial pressure of 3 mmHg. Comparison(s): No significant change from prior study. FINDINGS  Left Ventricle: Left ventricular ejection fraction, by estimation, is 55 to 60%. The left ventricle has normal function. The left ventricle has no regional wall motion abnormalities. The left ventricular internal cavity size was normal in size. There is  severe left ventricular hypertrophy. Abnormal (paradoxical) septal motion, consistent with RV pacemaker. Left ventricular diastolic parameters are indeterminate. Right Ventricle: The right ventricular size is moderately enlarged. No increase in right ventricular wall thickness. Right ventricular systolic function is mildly reduced. There is normal pulmonary artery systolic pressure. The tricuspid regurgitant velocity is 2.65 m/s, and with an assumed right atrial pressure of 3 mmHg, the estimated right ventricular systolic pressure is 31.1 mmHg. Left Atrium: Left atrial size was severely dilated. Right Atrium: Right atrial size was severely dilated. Pericardium: A small pericardial effusion is present. The pericardial effusion is circumferential. There is no evidence of cardiac tamponade. Presence of epicardial fat layer. Mitral Valve: The mitral valve is grossly normal. Trivial mitral valve regurgitation. No evidence of mitral valve stenosis. Tricuspid Valve: The tricuspid valve is grossly normal. Tricuspid valve regurgitation is trivial. No evidence of tricuspid stenosis. Aortic Valve: The aortic valve is tricuspid. There is mild calcification of the aortic valve. There is mild thickening of the aortic valve. Aortic valve regurgitation is not visualized. Aortic valve sclerosis/calcification is present, without any evidence of aortic stenosis. Aortic valve peak gradient measures 10.6  mmHg. Pulmonic Valve: The pulmonic valve was grossly normal. Pulmonic valve regurgitation is not visualized. No evidence of pulmonic stenosis. Aorta: The aortic root and ascending aorta are structurally normal, with no evidence of dilitation. Venous: The inferior vena cava is normal in size with greater than 50% respiratory variability, suggesting right atrial pressure of 3 mmHg. IAS/Shunts: The atrial septum is grossly normal. Additional Comments: A device lead is visualized in the right atrium and right ventricle.  LEFT VENTRICLE PLAX 2D LVIDd:  3.30 cm   Diastology LVIDs:         2.40 cm   LV e' medial:    5.33 cm/s LV PW:         1.80 cm   LV E/e' medial:  16.9 LV IVS:        1.60 cm   LV e' lateral:   4.90 cm/s LVOT diam:     1.80 cm   LV E/e' lateral: 18.4 LVOT Area:     2.54 cm  LEFT ATRIUM           Index        RIGHT ATRIUM           Index LA diam:      5.00 cm 2.95 cm/m   RA Area:     17.90 cm LA Vol (A4C): 61.8 ml 36.40 ml/m  RA Volume:   42.10 ml  24.80 ml/m  AORTIC VALVE              PULMONIC VALVE AV Vmax:      163.00 cm/s PV Vmax:       0.74 m/s AV Peak Grad: 10.6 mmHg   PV Peak grad:  2.2 mmHg  AORTA Ao Root diam: 3.10 cm Ao Asc diam:  3.30 cm MITRAL VALVE               TRICUSPID VALVE MV Area (PHT): 3.24 cm    TR Peak grad:   28.1 mmHg MV Decel Time: 234 msec    TR Vmax:        265.00 cm/s MV E velocity: 90.10 cm/s MV A velocity: 33.00 cm/s  SHUNTS MV E/A ratio:  2.73        Systemic Diam: 1.80 cm Darryle Decent MD Electronically signed by Darryle Decent MD Signature Date/Time: 03/18/2024/7:10:34 PM    Final     Scheduled Meds:  ALPRAZolam  0.25 mg Oral BID   apixaban   2.5 mg Oral BID   feeding supplement  237 mL Oral BID BM   guaiFENesin  600 mg Oral BID   levothyroxine  75 mcg Oral QAC breakfast   metoprolol  tartrate  50 mg Oral BID   pantoprazole  40 mg Oral Daily   [START ON 03/20/2024] predniSONE  40 mg Oral Q breakfast   QUEtiapine  50 mg Oral BID   Continuous  Infusions:  sodium chloride  75 mL/hr at 03/19/24 1253   azithromycin 500 mg (03/19/24 0543)   cefTRIAXone (ROCEPHIN)  IV 2 g (03/19/24 1134)   PRN Meds: acetaminophen  **OR** acetaminophen , haloperidol lactate, HYDROcodone-acetaminophen , ipratropium-albuterol , ondansetron  **OR** ondansetron  (ZOFRAN ) IV, traZODone  Time spent: 55 minutes  Author: ELVAN SOR. MD [facility] 03/19/2024 3:02 PM  To reach On-call, see care teams to locate the attending and reach out to them via [email or web address]. If 7PM-7AM, please contact night-coverage If you still have difficulty reaching the attending provider, please page the [facility] (Director on Call) for [facility] on amion for assistance.

## 2024-03-19 NOTE — Evaluation (Signed)
 Occupational Therapy Evaluation Patient Details Name: Jacqueline Lozano MRN: [id number] DOB: [birth year] Today's Date: 03/19/2024   History of Present Illness   Jacqueline Lozano is a 89 y.o. female with medical history significant for Permanent A-fib s/p ablation on Eliquis , complete heart block s/p PPM , HTN, HFpEF, hypothyroidism, aortic valve stenosis being admitted with community-acquired pneumonia with a possible element of CHF.     Clinical Impressions Pt was seen for OT evaluation this date. 3 week ago pt was moved into her daughter's home due to increased cognition and weakness. Pt daughter reports prior to 3 weeks ago pt was slightly more indep requiring assistance for all IADL and bathing. Within the last 3 weeks; PTA  pt was requiring assistance for pericare, LB dressing and currently sink bathing due to weakness. Pt's daughter reports Jacqueline Lozano limited mobility often waiting for her daughter to come to her home to get her out of bed and into her WC if seeming too tried to utilize her rollator. Pt presents with deficits in decreased Ind in self care, balance, functional mobility/transfers, activity tolerance, and safety awareness affecting safe and optimal ADL completion. Pt currently requires MIN-MODA for bed mobility, requiring heavy cues for hand placement and attention. Pt STS from EOB with MOD, often pulls RW towards self, despite verbal and tactile cuing. Pt step pivoted from EOB<>recliner with MODA +1 and MINA +1 for safety when PT overlap for transfer training. Pt would benefit from skilled OT services to address noted impairments and functional limitations (see below for any additional details) in order to maximize safety and independence while minimizing future risk of falls, injury, and readmission. Pt would benefit from skilled OT services to address noted impairments and functional limitations (see below for any additional details) in order to maximize safety and independence  while minimizing future risk of falls, injury, and readmission. OT will follow acutely.    If plan is discharge home, recommend the following:   A little help with walking and/or transfers;A little help with bathing/dressing/bathroom;Assistance with cooking/housework;Direct supervision/assist for medications management;Direct supervision/assist for financial management;Assist for transportation;Help with stairs or ramp for entrance;Supervision due to cognitive status     Functional Status Assessment   Patient has had a recent decline in their functional status and demonstrates the ability to make significant improvements in function in a reasonable and predictable amount of time.     Equipment Recommendations   None recommended by OT;Other (comment) (Defer to next venue of care)     Recommendations for Other Services         Precautions/Restrictions   Precautions Precautions: Fall Recall of Precautions/Restrictions: Impaired Restrictions Weight Bearing Restrictions Per Provider Order: No     Mobility Bed Mobility Overal bed mobility: Needs Assistance Bed Mobility: Sidelying to Sit, Rolling Rolling: Used rails, Min assist Sidelying to sit: Mod assist, Used rails, HOB elevated       General bed mobility comments: Heavy cues for initation of tasks and hand placememt    Transfers Overall transfer level: Needs assistance Equipment used: Rolling walker (2 wheels) Transfers: Sit to/from Stand, Bed to chair/wheelchair/BSC Sit to Stand: Mod assist     Step pivot transfers: Mod assist     General transfer comment: STS from EOB with MOD freq pulling on RW towards self      Balance Overall balance assessment: Needs assistance Sitting-balance support: Feet supported, Bilateral upper extremity supported Sitting balance-Leahy Scale: Fair Sitting balance - Comments: Steady reaching within BOS   Standing balance  support: During functional activity Standing  balance-Leahy Scale: Poor Standing balance comment: Reliant on RW in static standing                           ADL either performed or assessed with clinical judgement   ADL Overall ADL's : Needs assistance/impaired Eating/Feeding: Set up   Grooming: Set up;Sitting Grooming Details (indicate cue type and reason): verbal cues for tasks initiation             Lower Body Dressing: Moderate assistance;Sitting/lateral leans   Toilet Transfer: Rolling walker (2 wheels);Moderate assistance;Ambulation Toilet Transfer Details (indicate cue type and reason): Simulated SPT into recliner         Functional mobility during ADLs: Moderate assistance;Rolling walker (2 wheels);Cueing for safety;Cueing for sequencing       Vision Baseline Vision/History: 1 Wears glasses                         Pertinent Vitals/Pain Pain Assessment Pain Assessment: Faces Pain Score: 0-No pain Faces Pain Scale: No hurt     Extremity/Trunk Assessment Upper Extremity Assessment Upper Extremity Assessment: Generalized weakness   Lower Extremity Assessment Lower Extremity Assessment: Defer to PT evaluation;Generalized weakness   Cervical / Trunk Assessment Cervical / Trunk Assessment: Normal   Communication Communication Communication: Impaired Factors Affecting Communication: Hearing impaired   Cognition Arousal: Alert Behavior During Therapy: WFL for tasks assessed/performed Cognition: Cognition impaired   Orientation impairments: Place, Situation, Time Awareness: Intellectual awareness impaired, Online awareness impaired Memory impairment (select all impairments): Short-term memory, Working [employer] fast streamer, Non-declarative long-term memory, Geneticist, molecular long-term memory Attention impairment (select first level of impairment): Focused attention, Sustained attention, Selective attention, Alternating attention, Divided attention Executive functioning impairment (select all impairments):  Initiation, Sequencing, Organization, Reasoning, Problem solving OT - Cognition Comments: A/Ox1 self only                 Following commands: Impaired Following commands impaired: Follows one step commands with increased time, Follows one step commands inconsistently     Cueing  General Comments   Cueing Techniques: Verbal cues;Tactile cues;Visual cues;Gestural cues  Daughter at bedside eager to motivate pt, on RA on arrival to room sp02 levels >90% pre/post mobility   Exercises          Home Living Family/patient expects to be discharged to:: Private residence Living Arrangements: Alone Available Help at Discharge: Family Type of Home: House Home Access: Stairs to enter [employer] of Steps: 1 threshold step Entrance Stairs-Rails: None Home Layout: One level     Bathroom Shower/Tub: Walk-in shower;Tub/shower unit   Bathroom Toilet: Handicapped height Bathroom Accessibility: Yes How Accessible: Accessible via walker Home Equipment: Rollator (4 wheels);Electric scooter;Grab bars - tub/shower;Shower seat - built in;Wheelchair - manual;BSC/3in1   Additional Comments: Info above for pt's personal home. Past 3 weeks pt living with her daughter with ramp access, one level and bird baths at baseline, daughter assist with pericare as needed      Prior Functioning/Environment Prior Level of Function : Needs assist             Mobility Comments: amb with rollator at times very minimal amb, furniture walks within home short distances into wheelchair with [facility] from daughter ADLs Comments: Bird baths at baseline with assist from daughter, setupA for dressing, often LB dressing assistance when greater level of confusion    OT Problem List: Decreased strength;Decreased activity tolerance;Impaired balance (sitting and/or  standing);Decreased coordination;Decreased cognition;Decreased safety awareness;Decreased knowledge of use of DME or AE;Decreased knowledge of  precautions   OT Treatment/Interventions: Self-care/ADL training;Therapeutic exercise;Energy conservation;DME and/or AE instruction;Therapeutic activities;Cognitive remediation/compensation;Patient/family education;Balance training      OT Goals(Current goals can be found in the care plan section)   Acute Rehab OT Goals Patient Stated Goal: Get stronger OT Goal Formulation: With patient/family Time For Goal Achievement: 04/02/24 Potential to Achieve Goals: Good ADL Goals Pt Will Perform Grooming: with supervision;standing Pt Will Perform Lower Body Dressing: with supervision;sit to/from stand Pt Will Transfer to Toilet: with supervision Pt Will Perform Toileting - Clothing Manipulation and hygiene: sitting/lateral leans;with min assist   OT Frequency:  Min 2X/week    Co-evaluation              AM-PAC OT 6 Clicks Daily Activity     Outcome Measure Help from another person eating meals?: A Little Help from another person taking care of personal grooming?: A Little Help from another person toileting, which includes using toliet, bedpan, or urinal?: A Lot Help from another person bathing (including washing, rinsing, drying)?: A Lot Help from another person to put on and taking off regular upper body clothing?: A Little Help from another person to put on and taking off regular lower body clothing?: A Little 6 Click Score: 16   End of Session Equipment Utilized During Treatment: Gait belt;Rolling walker (2 wheels) Nurse Communication: Mobility status  Activity Tolerance: Patient tolerated treatment well Patient left: in chair;with call bell/phone within reach;with chair alarm set;with family/visitor present  OT Visit Diagnosis: Unsteadiness on feet (R26.81);Other abnormalities of gait and mobility (R26.89);Muscle weakness (generalized) (M62.81);History of falling (Z91.81);Other symptoms and signs involving cognitive function                Time: 9092-9065 OT Time  Calculation (min): 27 min Charges:  OT General Charges $OT Visit: 1 Visit OT Evaluation $OT Eval Low Complexity: 1 Low OT Treatments $Self Care/Home Management : 8-22 mins  Larraine Colas M.S. OTR/L  03/19/24, 11:22 AM

## 2024-03-20 DIAGNOSIS — J189 Pneumonia, unspecified organism: Secondary | ICD-10-CM | POA: Diagnosis not present

## 2024-03-20 LAB — CBC
HCT: 32.6 % — ABNORMAL LOW (ref 36.0–46.0)
Hemoglobin: 11.5 g/dL — ABNORMAL LOW (ref 12.0–15.0)
MCH: 30.9 pg (ref 26.0–34.0)
MCHC: 35.3 g/dL (ref 30.0–36.0)
MCV: 87.6 fL (ref 80.0–100.0)
Platelets: 232 K/uL (ref 150–400)
RBC: 3.72 MIL/uL — ABNORMAL LOW (ref 3.87–5.11)
RDW: 14.3 % (ref 11.5–15.5)
WBC: 24 K/uL — ABNORMAL HIGH (ref 4.0–10.5)
nRBC: 0 % (ref 0.0–0.2)

## 2024-03-20 LAB — BASIC METABOLIC PANEL WITH GFR
Anion gap: 12 (ref 5–15)
BUN: 61 mg/dL — ABNORMAL HIGH (ref 8–23)
CO2: 20 mmol/L — ABNORMAL LOW (ref 22–32)
Calcium: 9.4 mg/dL (ref 8.9–10.3)
Chloride: 109 mmol/L (ref 98–111)
Creatinine, Ser: 2.32 mg/dL — ABNORMAL HIGH (ref 0.44–1.00)
GFR, Estimated: 20 mL/min — ABNORMAL LOW (ref >60)
Glucose, Bld: 108 mg/dL — ABNORMAL HIGH (ref 70–99)
Potassium: 4.4 mmol/L (ref 3.5–5.1)
Sodium: 141 mmol/L (ref 135–145)

## 2024-03-20 LAB — PHOSPHORUS: Phosphorus: 3.8 mg/dL (ref 2.5–4.6)

## 2024-03-20 LAB — MAGNESIUM: Magnesium: 2 mg/dL (ref 1.7–2.4)

## 2024-03-20 MED ORDER — AZITHROMYCIN 250 MG PO TABS
500.0000 mg | ORAL_TABLET | Freq: Every day | ORAL | Status: AC
  Administered 2024-03-21 – 2024-03-22 (×2): 500 mg via ORAL
  Filled 2024-03-20 (×2): qty 2

## 2024-03-20 MED ORDER — QUETIAPINE FUMARATE 25 MG PO TABS
25.0000 mg | ORAL_TABLET | Freq: Every evening | ORAL | Status: DC
  Filled 2024-03-20: qty 1

## 2024-03-20 MED ORDER — SODIUM CHLORIDE 0.9 % IV SOLN: INTRAVENOUS | Status: DC

## 2024-03-20 MED ORDER — POLYETHYLENE GLYCOL 3350 17 G PO PACK: 17.0000 g | PACK | Freq: Every day | ORAL | Status: DC | PRN

## 2024-03-20 MED ORDER — CHLORHEXIDINE GLUCONATE CLOTH 2 % EX PADS
6.0000 | MEDICATED_PAD | Freq: Every day | CUTANEOUS | Status: DC
  Administered 2024-03-20 – 2024-03-24 (×5): 6 via TOPICAL

## 2024-03-20 MED ORDER — SENNOSIDES-DOCUSATE SODIUM 8.6-50 MG PO TABS: 1.0000 | ORAL_TABLET | Freq: Two times a day (BID) | ORAL | Status: DC | PRN

## 2024-03-20 NOTE — Progress Notes (Addendum)
 Speech Language Pathology Treatment: Dysphagia  Patient Details Name: Jacqueline Lozano MRN: [id number] DOB: [birth year] Today's Date: 03/20/2024 Time: 9069-8984 SLP Time Calculation (min) (ACUTE ONLY): 45 min  Assessment / Plan / Recommendation Clinical Impression  Jacqueline Lozano seen for ongoing assessment of swallowing and toleration of current mech soft diet(for ease of chewing and conservation of energy). Again, Jacqueline Lozano was drowsy/sleepy d/t recent Medication(sedating effect) per the Jacqueline Lozano(present). Jacqueline Lozano was drowsy and required MOD+ verbal/tactile cues for alerting for po tasks. Jacqueline Lozano required FULL support for feeding also. Jacqueline Lozano was alert to name. Jacqueline Lozano does not have an Upper denture plate; few bottom Dentition. Jacqueline Lozano has Baseline Cognitive Decline- suspect this heavily impacts her presentation currently. On RA, afebrile.   Jacqueline Lozano appears to present w/ grossly functional oropharyngeal phase swallowing w/ No overt, gross oropharyngeal phase dysphagia noted w/ few trials taken this morning, No overt neuromuscular deficits noted. Jacqueline Lozano consumed the po trials w/ No immediate, overt clinical s/s of aspiration.  Jacqueline Lozano has Baseline Cognitive Impairment/Decline per chart notes. ANY Cognitive decline can impact her overall awareness/timing of swallowing and overall safety during po tasks which increases risk for aspiration, choking. Jacqueline Lozano's risk for aspiration can be reduced when following general aspiration precautions and using a modified diet consistency of broken down foods d/t lacking Upper Dentition(baseline). Jacqueline Lozano also required MOD+ assistance at meals for support, safety. Other impacting factors on Jacqueline Lozano's swallowing success are sedating Medications, deconditioning/fatigue/weakness, need for support w/ feeding and positioning upright for po's, and advanced age. These factors and Cognitive Impairment can increase risk for dysphagia as well as decreased oral intake overall.    During po trials, Jacqueline Lozano consumed the po's w/ no overt coughing,  decline in vocal quality, or change in respiratory presentation during/post trials. Oral phase appeared grossly [facility] w/ timely bolus management, mashing/gumming/mastication, and control of bolus propulsion for A-P transfer for swallowing. Oral clearing achieved w/ all trial consistencies given min time(no upper denture plate) -- moistened, soft foods given for ease of gumming/chewing.  Jacqueline Lozano appeared very Distracted and required FULL feeding support and setup/positioning.    Recommend continue a more Mech Soft consistency diet w/ well-Cut meats, moistened foods; Thin liquids -- carefully monitor straw use(pinched straw to limit bolus size/volume), and Jacqueline Lozano should help to Hold Cup/bottle when drinking. ONLY give po's when Jacqueline Lozano is FULLY awake/alert to participate. Recommend general aspiration precautions including small sips/bites slowly; give time to clear orally b/t bites/sips. Pills CRUSHED in Puree for safer, easier swallowing -- it was encouraged now and for D/C to the Dtr. Feeding support and Supervision at meals.    Education given on Pills in Puree; Dysphagia Drink Cup for more managed drinking of liquids; food consistencies and easy to eat options; general aspiration precautions- Handouts; impact of Cognition on swallowing and general oral intake to Jacqueline Lozano and Dtr.  No further skilled ST services indicated currently. NSG updated, agreed. MD updated. Recommend Dietician f/u for support.  Recommended a Palliative Care consult for f/u at D/C for support and GOC overall in setting of Jacqueline Lozano's Chronic Comorbidities.        HPI HPI: Jacqueline Lozano is a 89 y.o. female with medical history significant for Cognitive Impairment/decline per chart, permanent pacemaker, A-fib s/p ablation on Eliquis , complete heart block s/p PPM , HTN, HFpEF, hypothyroidism, aortic valve stenosis being admitted with community-acquired pneumonia with a possible element of CHF.  Jacqueline Lozano presented with shortness of breath, worse on lying flat in the setting of  recently completing a course of antibiotics for UTI.  Jacqueline Lozano has a nonproductive cough without fever or chills.  Of late has been sleeping a lot -- 20 hours of the day per Jacqueline Lozano.  Per chart notes, Jacqueline Lozano has history of Cognitive impairment. Baseline mobility reported as mod with occasional assistant provided from Jacqueline Lozano but dependent for community mobility using a wheelchair,currently requiring modA x2 at times for room mobility..  Jacqueline Lozano lived alone w/ Jacqueline Lozano checking on her until 3 weeks ago; Jacqueline Lozano is now living w/ the Jacqueline Lozano d/t level of care and confusion noted.      SLP Plan  All goals met          Recommendations  Diet recommendations: Dysphagia 3 (mechanical soft);Thin liquid (gravies; no straw use if indicated) Liquids provided via: Cup (pinched straw if using one to limit bolus size) Medication Administration: Crushed with puree Supervision: Staff to assist with self feeding;Full supervision/cueing for compensatory strategies Compensations: Minimize environmental distractions;Slow rate;Small sips/bites;Lingual sweep for clearance of pocketing;Follow solids with liquid;Multiple dry swallows after each bite/sip Postural Changes and/or Swallow Maneuvers: Out of bed for meals;Seated upright 90 degrees;Upright 30-60 min after meal                 (Palliative Care consult for GOC moving forward) Oral care BID;Oral care before and after PO;Staff/trained caregiver to provide oral care   Frequent or constant Supervision/Assistance Dysphagia, unspecified (R13.10) (Baseline Cognitive Impairment/decline; Missing Dentition; need for FULL support w/ po feeding currently; need for sedating Medication d/t agitation)     All goals met       Comer Portugal, MS, CCC-SLP Speech Language Pathologist Rehab Services; [facility] [phone] (ascom) Guerry Covington  03/20/2024, 4:28 PM

## 2024-03-20 NOTE — Progress Notes (Signed)
 Occupational Therapy Treatment Patient Details Name: Jacqueline Lozano MRN: [id number] DOB: 08-23-1934 Today's Date: 03/20/2024   History of present illness Jacqueline Lozano is a 89 y.o. female with medical history significant for Permanent A-fib s/p ablation on Eliquis , complete heart block s/p PPM , HTN, HFpEF, hypothyroidism, aortic valve stenosis being admitted with community-acquired pneumonia with a possible element of CHF.   OT comments  Patient seen for OT treatment on this date. Upon arrival to room patient asleep in bed with daughter present; daughter reports this has been going on all day due to sedative given earlier, daughter agreeable for OT to attempt to stimulate/arouse patient. OT provided tactile, verbal stimuli to arouse patient with no success, OT transitioned patient to EOB with total A in which patient responded to, she was able to briefly open eyes. OT attempted to have patient engage in grooming tasks (comb hair/wash face) which patient was not able to participate in at all, OT performed with total care. RN present to attempt to have patient toilet on [facility], attempted sit<>stand with OT providing total A, toileting deferred until patient is more aware/alert. Total A x 2 to transition patient back to supine and perform bed mobility. Patient began to tremor/twitch in BUE, RN had left the room but OT located RN and notified him; he was agreeable to check on patient.  Patient ended treatment in bed with bed alarm on and all needs within reach and daughter present. Patients progress limited this session due to sedative given earlier this date. Discharge recommendation remains appropriate.        If plan is discharge home, recommend the following:  A lot of help with walking and/or transfers;A lot of help with bathing/dressing/bathroom;Assistance with cooking/housework;Assistance with feeding   Equipment Recommendations  None recommended by OT;Other (comment) (defer to next venue)     Recommendations for Other Services      Precautions / Restrictions Precautions Precautions: Fall Recall of Precautions/Restrictions: Impaired Restrictions Weight Bearing Restrictions Per Provider Order: No       Mobility Bed Mobility Overal bed mobility: Needs Assistance Bed Mobility: Supine to Sit, Sit to Supine Rolling: Total assist, +2 for physical assistance   Supine to sit: Total assist, +2 for physical assistance Sit to supine: Total assist, +2 for physical assistance        Transfers                         Balance Overall balance assessment: Needs assistance Sitting-balance support: Bilateral upper extremity supported, Feet supported Sitting balance-Leahy Scale: Zero                                     ADL either performed or assessed with clinical judgement   ADL Overall ADL's : Needs assistance/impaired     Grooming: Total assistance;Sitting Grooming Details (indicate cue type and reason): attempted to have patient comb hair/wash face, patient was not engaging, total care provided by OT                 Toilet Transfer: Total assistance (attempted to perform SPT from EOB to [facility], patient not engaging, transfer not completed)                  Extremity/Trunk Assessment Upper Extremity Assessment Upper Extremity Assessment: Generalized weakness   Lower Extremity Assessment Lower Extremity Assessment: Defer to PT evaluation  Vision       Perception     Praxis     Communication Communication Communication: Impaired Factors Affecting Communication: Difficulty expressing self   Cognition Arousal: Suspect due to medications, Stuporous Behavior During Therapy: Flat affect Cognition: Difficult to assess Difficult to assess due to: Level of arousal   Awareness: Online awareness impaired, Intellectual awareness impaired Memory impairment (select all impairments): Short-term memory, Working memory,  Non-declarative long-term memory, Geneticist, molecular long-term memory Attention impairment (select first level of impairment): Focused attention, Sustained attention, Selective attention, Alternating attention, Divided attention Executive functioning impairment (select all impairments): Initiation, Organization, Sequencing, Reasoning, Problem solving                   Following commands: Impaired Following commands impaired: Only follows one step commands consistently      Cueing   Cueing Techniques: Verbal cues, Tactile cues, Visual cues  Exercises      Shoulder Instructions       General Comments      Pertinent Vitals/ Pain       Pain Assessment Pain Assessment: Faces Faces Pain Scale: Hurts a little bit  Home Living                                          Prior Functioning/Environment              Frequency  Min 2X/week        Progress Toward Goals  OT Goals(current goals can now be found in the care plan section)  Progress towards OT goals: Not progressing toward goals - comment (patient very lethargic during visit, sedated earlier today, unable to progress towards goals on this date)  Acute Rehab OT Goals Patient Stated Goal: patient not able to verbalize goal OT Goal Formulation: Patient unable to participate in goal setting Time For Goal Achievement: 04/02/24 Potential to Achieve Goals: Fair ADL Goals Pt Will Perform Grooming: with supervision;standing Pt Will Perform Lower Body Dressing: with supervision;sit to/from stand Pt Will Transfer to Toilet: with supervision Pt Will Perform Toileting - Clothing Manipulation and hygiene: sitting/lateral leans;with min assist  Plan      Co-evaluation                 AM-PAC OT 6 Clicks Daily Activity     Outcome Measure   Help from another person eating meals?: Total Help from another person taking care of personal grooming?: Total Help from another person toileting, which  includes using toliet, bedpan, or urinal?: Total Help from another person bathing (including washing, rinsing, drying)?: Total Help from another person to put on and taking off regular upper body clothing?: Total Help from another person to put on and taking off regular lower body clothing?: Total 6 Click Score: 6    End of Session    OT Visit Diagnosis: Unsteadiness on feet (R26.81);Other abnormalities of gait and mobility (R26.89);Muscle weakness (generalized) (M62.81);History of falling (Z91.81);Other symptoms and signs involving cognitive function   Activity Tolerance Patient limited by lethargy   Patient Left in bed;with call bell/phone within reach;with bed alarm set;with family/visitor present   Nurse Communication Other (comment) (patients BUE twitching once back in bed, RN notified)        Time: 9579-9551 OT Time Calculation (min): 28 min  Charges: OT General Charges $OT Visit: 1 Visit OT Treatments $Self Care/Home Management : 23-37 mins  Rogers  Camay Pedigo, OT/L MSOT, 03/20/2024

## 2024-03-20 NOTE — Progress Notes (Signed)
 [facility] Progress Note  Patient: Jacqueline Lozano    FMW:[id number]  DOA: 03/18/2024     Date of Service: the patient was seen and examined on 03/20/2024  Chief Complaint  Patient presents with   Cough   Fatigue   Brief hospital course: Jacqueline Lozano is a 89 y.o. female with medical history significant for Permanent A-fib s/p ablation on Eliquis , complete heart block s/p PPM , HTN, HFpEF, hypothyroidism, aortic valve stenosis being admitted with community-acquired pneumonia with a possible element of CHF.  She presented with shortness of breath, worse on lying flat in the setting of recently completing a course of antibiotics for UTI.  She has a nonproductive cough without fever or chills.  Of late has been sleeping a lot. In the ED, Slightly hypothermic at 94.5 with otherwise normal vitals Labs with troponin 76 and BNP 163 WBC 14.4 with lactic acid 1.6, procalcitonin 1.5 and negative respiratory viral panel BMP with creatinine 1.98 above baseline of 1.6 in April EKG showed a ventricular paced rhythm at 71 CT chest without contrast showing multiple pulmonary nodules most significant solid pulmonary nodule of 6 mm with recommendation for noncontrasted CT chest in 3 to 6 months Patient was treated with cefepime, Flagyl,vancomycin and LR bolus and DuoNeb also given chewable aspirin 324 Admission requested    Assessment and Plan:   # CAP (community acquired pneumonia) Rhinovirus +ve Rocephin and azithromycin Guaifenesin DuoNebs as needed Leukocytosis, procalcitonin 1.56 Continue antibiotics for now 10/12 Solu-Medrol 40 mg IV daily x 2 doses followed by prednisone 40 mg p.o. daily x 3 doses   # AKI on CKD stage IIb Baseline creatinine 1.5, eGFR 34, sCr 2.07>2.32 Discontinued low-dose Lasix  Started gentle hydration with IV fluids Bladder scan rule out urinary retention US  renal: Echogenic kidneys likely secondary to medical renal disease. Simple left renal cyst. 10/14  Check bladder scan again today  # Acute on chronic heart failure with preserved ejection fraction (HFpEF) (HCC) Mild exacerbation Orthopnea with BNP 163.  CT chest without effusion or pulmonary vascular congestion S/p Low-dose IV Lasix , discontinued on 10/13 due to elevated creatinine Continue metoprolol    Incidental pulmonary nodule, > 3mm and < 8mm Follow-up per radiology with repeat CT scan in 3 to 6 months   Permanent atrial fibrillation (HCC) Chronic anticoagulation Continue Eliquis  and metoprolol    # Delirium Patient restless, agitated while in the emergency room Haldol administered, continue as needed Delirium precautions 10/13 as per patient's daughter Xanax did help her.  She was started Xanax 0.25 mg twice daily x 2 doses Started Seroquel 50 mg p.o. twice daily and trazodone as needed for sleep 10/14 avoid sedating medications today due to drowsiness.  Decrease Seroquel 25 mg every evening from tomorrow 10/15  Aortic valve stenosis No acute issues   OSA (obstructive sleep apnea) CPAP nightly if desired   Complete heart block s/p biventricular cardiac pacemaker No acute issues suspected    Body mass index is 31.95 kg/m.  Interventions:  Diet: Heart healthy diet DVT Prophylaxis: Eliquis   Advance goals of care discussion: DNR-limited  Family Communication: family was present at bedside, at the time of interview.  The pt provided permission to discuss medical plan with the family. Opportunity was given to ask question and all questions were answered satisfactorily.   Disposition:  Pt is from home, admitted with rhinovirus, pneumonia, AKI, AMS, still has AKI, which precludes a safe discharge. Discharge to homelessness, TBD after PT/OT eval, when stable, may need few days to  improve.  Subjective: No significant events overnight, today patient was very sleepy, did not wake up for me.  As per patient's daughter she was awake in the morning but dozing off, she is  resting after taking sedating medications.  So we will avoid more sedating medications today.   Physical Exam: General: NAD, lying comfortably Appear in no distress, Eyes: closed ENT: Oral Mucosa Clear, dry Neck: no JVD,  Cardiovascular: S1 and S2 Present, no Murmur,  Respiratory: good respiratory effort, Bilateral Air entry equal and Decreased, no Crackles, no wheezes Abdomen: Bowel Sound present, Soft and no tenderness,  Skin: no rashes Extremities: no Pedal edema, no calf tenderness Neurologic: without any new focal findings Gait not checked due to patient safety concerns  Vitals:   03/20/24 0412 03/20/24 0500 03/20/24 0847 03/20/24 1303  BP: (!) 130/52  (!) 141/72 (!) 140/65  Pulse: 69  70 70  Resp: 16     Temp: 97.7 F (36.5 C)  (!) 96.5 F (35.8 C) (!) 96.8 F (36 C)  TempSrc: Oral  Axillary Axillary  SpO2: 95%  99% 96%  Weight:  74.2 kg    Height:        Intake/Output Summary (Last 24 hours) at 03/20/2024 1525 Last data filed at 03/20/2024 1315 Gross per 24 hour  Intake 2503.71 ml  Output --  Net 2503.71 ml   Filed Weights   03/18/24 0026 03/19/24 0500 03/20/24 0500  Weight: 72.6 kg 73.3 kg 74.2 kg    Data Reviewed: I have personally reviewed and interpreted daily labs, tele strips, imagings as discussed above. I reviewed all nursing notes, pharmacy notes, vitals, pertinent old records I have discussed plan of care as described above with RN and patient/family.  CBC: Recent Labs  Lab 03/18/24 0032 03/18/24 0820 03/19/24 0343 03/20/24 1044  WBC 14.4* 17.2* 18.2* 24.0*  HGB 13.4 12.5 12.2 11.5*  HCT 40.9 38.2 37.0 32.6*  MCV 92.7 91.0 92.5 87.6  PLT 311 316 236 232   Basic Metabolic Panel: Recent Labs  Lab 03/18/24 0032 03/18/24 0820 03/19/24 0343 03/20/24 1044  NA 136 134* 136 141  K 4.7 4.6 4.8 4.4  CL 103 103 103 109  CO2 21* 21* 23 20*  GLUCOSE 114* 165* 96 108*  BUN 52* 44* 53* 61*  CREATININE 1.98* 1.83* 2.07* 2.32*  CALCIUM 9.4  9.3 9.4 9.4  MG  --  1.4* 2.5* 2.0  PHOS  --  3.1 4.3 3.8    Studies: No results found.   Scheduled Meds:  apixaban   2.5 mg Oral BID   [START ON 03/21/2024] azithromycin  500 mg Oral Daily   feeding supplement  237 mL Oral BID BM   guaiFENesin  600 mg Oral BID   levothyroxine  75 mcg Oral QAC breakfast   metoprolol  tartrate  50 mg Oral BID   pantoprazole  40 mg Oral Daily   predniSONE  40 mg Oral Q breakfast   [START ON 03/21/2024] QUEtiapine  25 mg Oral QPM   Continuous Infusions:  cefTRIAXone (ROCEPHIN)  IV 2 g (03/20/24 0951)   PRN Meds: acetaminophen  **OR** acetaminophen , haloperidol lactate, HYDROcodone-acetaminophen , ipratropium-albuterol , ondansetron  **OR** ondansetron  (ZOFRAN ) IV, polyethylene glycol, senna-docusate, traZODone  Time spent: 40 minutes  Author: ELVAN SOR. MD [facility] 03/20/2024 3:25 PM  To reach On-call, see care teams to locate the attending and reach out to them via [email or web address]. If 7PM-7AM, please contact night-coverage If you still have difficulty reaching the attending provider, please page the DOC (  Director on Call) for [facility] on amion for assistance.

## 2024-03-20 NOTE — Progress Notes (Signed)
 PHARMACIST - PHYSICIAN COMMUNICATION  CONCERNING: Antibiotic IV to Oral Route Change Policy  RECOMMENDATION: This patient is receiving Azithromycin 500 mg q24h by the intravenous route.  Based on criteria approved by the Pharmacy and Therapeutics Committee, the antibiotic(s) is/are being converted to the equivalent oral dose form(s).  DESCRIPTION: These criteria include: Patient being treated for a respiratory tract infection, urinary tract infection, cellulitis or clostridium difficile associated diarrhea if on metronidazole The patient is not neutropenic and does not exhibit a GI malabsorption state The patient is eating (either orally or via tube) and/or has been taking other orally administered medications for a least 24 hours The patient is improving clinically and has a Tmax < 100.5  If you have questions about this conversion, please contact the Pharmacy Department  []   [phone] )  Jacqueline Lozano [x]   [phone] )  [facility] []   [phone] )  [facility] []   [phone] )  [facility] []   [phone] )  [facility]    Thank you for involving pharmacy in this patient's care.    Jacqueline Lozano PGY-1 Pharmacy Resident  [facility]  03/20/2024 12:49 PM

## 2024-03-20 NOTE — Plan of Care (Signed)

## 2024-03-20 NOTE — Progress Notes (Addendum)
 PT Cancellation Note  Patient Details Name: Jacqueline Lozano MRN: [id number] DOB: 1934-06-26   Cancelled Treatment:    Reason Eval/Treat Not Completed: Fatigue/lethargy limiting ability to participate Pt sleep at PT arrival. Pt recently administered sleeping medication. PT will revisit at later time. PT checked back x2.   Marcelus Dubberly A Wilber Fini 03/20/2024, 1:20 PM

## 2024-03-21 ENCOUNTER — Inpatient Hospital Stay

## 2024-03-21 DIAGNOSIS — J189 Pneumonia, unspecified organism: Secondary | ICD-10-CM | POA: Diagnosis not present

## 2024-03-21 LAB — BASIC METABOLIC PANEL WITH GFR
Anion gap: 9 (ref 5–15)
BUN: 68 mg/dL — ABNORMAL HIGH (ref 8–23)
CO2: 21 mmol/L — ABNORMAL LOW (ref 22–32)
Calcium: 9.4 mg/dL (ref 8.9–10.3)
Chloride: 112 mmol/L — ABNORMAL HIGH (ref 98–111)
Creatinine, Ser: 2.16 mg/dL — ABNORMAL HIGH (ref 0.44–1.00)
GFR, Estimated: 22 mL/min — ABNORMAL LOW (ref >60)
Glucose, Bld: 107 mg/dL — ABNORMAL HIGH (ref 70–99)
Potassium: 4.2 mmol/L (ref 3.5–5.1)
Sodium: 142 mmol/L (ref 135–145)

## 2024-03-21 LAB — PHOSPHORUS: Phosphorus: 4.1 mg/dL (ref 2.5–4.6)

## 2024-03-21 LAB — CBC
HCT: 34.7 % — ABNORMAL LOW (ref 36.0–46.0)
Hemoglobin: 11.2 g/dL — ABNORMAL LOW (ref 12.0–15.0)
MCH: 29.7 pg (ref 26.0–34.0)
MCHC: 32.3 g/dL (ref 30.0–36.0)
MCV: 92 fL (ref 80.0–100.0)
Platelets: 248 K/uL (ref 150–400)
RBC: 3.77 MIL/uL — ABNORMAL LOW (ref 3.87–5.11)
RDW: 14.6 % (ref 11.5–15.5)
WBC: 20.9 K/uL — ABNORMAL HIGH (ref 4.0–10.5)
nRBC: 0 % (ref 0.0–0.2)

## 2024-03-21 LAB — MAGNESIUM: Magnesium: 2.4 mg/dL (ref 1.7–2.4)

## 2024-03-21 MED ORDER — ADULT MULTIVITAMIN W/MINERALS CH
1.0000 | ORAL_TABLET | Freq: Every day | ORAL | Status: DC
  Administered 2024-03-21 – 2024-03-24 (×3): 1 via ORAL
  Filled 2024-03-21 (×4): qty 1

## 2024-03-21 MED ORDER — BISACODYL 10 MG RE SUPP
10.0000 mg | Freq: Once | RECTAL | Status: AC
Start: 2024-03-21 — End: 2024-03-21
  Administered 2024-03-21: 10 mg via RECTAL
  Filled 2024-03-21: qty 1

## 2024-03-21 MED ORDER — MINERAL OIL RE ENEM: 1.0000 | ENEMA | Freq: Once | RECTAL | Status: DC

## 2024-03-21 MED ORDER — BISACODYL 10 MG RE SUPP: 10.0000 mg | Freq: Every day | RECTAL | Status: DC | PRN

## 2024-03-21 MED ORDER — BISACODYL 5 MG PO TBEC
10.0000 mg | DELAYED_RELEASE_TABLET | Freq: Every day | ORAL | Status: DC
  Administered 2024-03-21: 10 mg via ORAL
  Filled 2024-03-21: qty 2

## 2024-03-21 MED ORDER — ENSURE PLUS HIGH PROTEIN PO LIQD
237.0000 mL | Freq: Three times a day (TID) | ORAL | Status: DC
  Administered 2024-03-22 – 2024-03-24 (×3): 237 mL via ORAL

## 2024-03-21 MED ORDER — SODIUM CHLORIDE 0.9 % IV SOLN: INTRAVENOUS | Status: DC

## 2024-03-21 MED ORDER — BISACODYL 5 MG PO TBEC
10.0000 mg | DELAYED_RELEASE_TABLET | Freq: Once | ORAL | Status: AC
  Administered 2024-03-21: 10 mg via ORAL
  Filled 2024-03-21: qty 2

## 2024-03-21 MED ORDER — POLYETHYLENE GLYCOL 3350 17 G PO PACK
17.0000 g | PACK | Freq: Two times a day (BID) | ORAL | Status: DC
  Administered 2024-03-21 – 2024-03-24 (×4): 17 g via ORAL
  Filled 2024-03-21 (×5): qty 1

## 2024-03-21 NOTE — [place] FL2 (Addendum)
   MEDICAID FL2 LEVEL OF CARE FORM     IDENTIFICATION  Patient Name: Jacqueline Lozano Birthdate: [birth year] Sex: female Admission Date (Current Location): 03/18/2024  [facility] and [place] Number:  Chiropodist and Address:  [facility], [address] [place], [place] [address]      Provider Number: [id number]  Attending Physician Name and Address:  Von Bellis, MD  Relative Name and Phone Number:  Adalaya Irion [phone]    Current Level of Care: Hospital Recommended Level of Care: Skilled Nursing Facility Prior Approval Number:    Date Approved/Denied:   PASRR Number: [id number]  Discharge Plan: SNF    Current Diagnoses: Patient Active Problem List   Diagnosis Date Noted   CAP (community acquired pneumonia) 03/18/2024   Acute on chronic heart failure with preserved ejection fraction (HFpEF) (HCC) 03/18/2024   Aortic valve stenosis 03/18/2024   Incidental pulmonary nodule, > 3mm and < 8mm 03/18/2024   Delirium 03/18/2024   Preop cardiovascular exam 08/19/2015   Shortness of breath 11/20/2013   Encounter for therapeutic drug monitoring 07/18/2013   Numbness and tingling in both hands 07/03/2013   Chronic diastolic CHF (congestive heart failure) (HCC) 02/19/2013   OSA (obstructive sleep apnea) 02/19/2013   Chest tightness 07/13/2012   Complete heart block-S./P. AV junction ablation 11/23/2010   Complete heart block s/p biventricular cardiac pacemaker 11/23/2010   Long term current use of anticoagulant 08/26/2010   FATIGUE 04/13/2010   HYPERTENSION, BENIGN 10/20/2009   Hyperlipidemia 09/19/2009   Permanent atrial fibrillation (HCC) 09/19/2009    Orientation RESPIRATION BLADDER Height & Weight     Time, Situation, Place  Normal Incontinent Weight: 172 lb 6.4 oz (78.2 kg) Height:  5' (152.4 cm)  BEHAVIORAL SYMPTOMS/MOOD NEUROLOGICAL BOWEL NUTRITION STATUS      Continent Diet (IET DYS 3 Room service appropriate?  Yes with Assist; Fluid consistency: Thin:)  AMBULATORY STATUS COMMUNICATION OF NEEDS Skin     Verbally Normal                       Personal Care Assistance Level of Assistance  Bathing, Feeding, Dressing Bathing Assistance: Limited assistance Feeding assistance: Limited assistance Dressing Assistance: Limited assistance     Functional Limitations Info             SPECIAL CARE FACTORS FREQUENCY  PT (By licensed PT), OT (By licensed OT)     PT Frequency: 5x OT Frequency: 5x            Contractures      Additional Factors Info  Code Status Code Status Info: dnr limited             Current Medications (03/21/2024):  This is the current hospital active medication list Current Facility-Administered Medications  Medication Dose Route Frequency Provider Last Rate Last Admin   0.9 %  sodium chloride  infusion   Intravenous Continuous Von Bellis, MD 75 mL/hr at 03/21/24 0441 New Bag at 03/21/24 0441   acetaminophen  (TYLENOL ) tablet 650 mg  650 mg Oral Q6H PRN Duncan, Hazel V, MD   650 mg at 03/20/24 2215   Or   acetaminophen  (TYLENOL ) suppository 650 mg  650 mg Rectal Q6H PRN Cleatus Delayne GAILS, MD       apixaban  (ELIQUIS ) tablet 2.5 mg  2.5 mg Oral BID Duncan, Hazel V, MD   2.5 mg at 03/21/24 0904   azithromycin (ZITHROMAX) tablet 500 mg  500 mg Oral Daily Tobie Myron M,  RPH   500 mg at 03/21/24 0904   bisacodyl (DULCOLAX) EC tablet 10 mg  10 mg Oral QHS Von Bellis, MD       bisacodyl (DULCOLAX) suppository 10 mg  10 mg Rectal Daily PRN Von Bellis, MD       cefTRIAXone (ROCEPHIN) 2 g in sodium chloride  0.9 % 100 mL IVPB  2 g Intravenous Q24H Duncan, Hazel V, MD   Stopped at 03/21/24 1320   Chlorhexidine  Gluconate Cloth 2 % PADS 6 each  6 each Topical Daily Von Bellis, MD   6 each at 03/21/24 0911   feeding supplement (ENSURE PLUS HIGH PROTEIN) liquid 237 mL  237 mL Oral BID BM Von Bellis, MD   237 mL at 03/21/24 1424   guaiFENesin (MUCINEX) 12 hr tablet  600 mg  600 mg Oral BID Duncan, Hazel V, MD   600 mg at 03/21/24 9095   haloperidol lactate (HALDOL) injection 2 mg  2 mg Intravenous Q6H PRN Von Bellis, MD       HYDROcodone-acetaminophen  (NORCO/VICODIN) 5-325 MG per tablet 1-2 tablet  1-2 tablet Oral Q4H PRN Duncan, Hazel V, MD   1 tablet at 03/19/24 2123   ipratropium-albuterol  (DUONEB) 0.5-2.5 (3) MG/3ML nebulizer solution 3 mL  3 mL Nebulization Q4H PRN Ward, Kristen N, DO       levothyroxine (SYNTHROID) tablet 75 mcg  75 mcg Oral QAC breakfast Von Bellis, MD   75 mcg at 03/21/24 0546   metoprolol  tartrate (LOPRESSOR ) tablet 50 mg  50 mg Oral BID Duncan, Hazel V, MD   50 mg at 03/21/24 9095   ondansetron  (ZOFRAN ) tablet 4 mg  4 mg Oral Q6H PRN Duncan, Hazel V, MD       Or   ondansetron  (ZOFRAN ) injection 4 mg  4 mg Intravenous Q6H PRN Duncan, Hazel V, MD       pantoprazole (PROTONIX) EC tablet 40 mg  40 mg Oral Daily Von Bellis, MD   40 mg at 03/21/24 0904   polyethylene glycol (MIRALAX / GLYCOLAX) packet 17 g  17 g Oral BID Von Bellis, MD   17 g at 03/21/24 1050   predniSONE (DELTASONE) tablet 40 mg  40 mg Oral Q breakfast Von Bellis, MD   40 mg at 03/21/24 0904   QUEtiapine (SEROQUEL) tablet 25 mg  25 mg Oral QPM Von Bellis, MD       senna-docusate (Senokot-S) tablet 1 tablet  1 tablet Oral BID PRN Nazari, Walid A, RPH       traZODone (DESYREL) tablet 50 mg  50 mg Oral QHS PRN Von Bellis, MD   50 mg at 03/20/24 2213     Discharge Medications: Please see discharge summary for a list of discharge medications.  Relevant Imaging Results:  Relevant Lab Results:   Additional Information [id number]  Alfonso Rummer, LCSW

## 2024-03-21 NOTE — TOC Progression Note (Signed)
 Transition of Care [facility]) - Progression Note    Patient Details  Name: Jacqueline Lozano MRN: [id number] Date of Birth: Mar 13, 1935  Transition of Care [facility] CM/SW Contact  Alfonso Rummer, LCSW Phone Number: 03/21/2024, 3:56 PM  Clinical Narrative:     LCSW A. Rummer spk with patient daughter via phone. [name] reports she will move forward with skilled nursing facility recommendation. [place] FL2 completed and snf bed search submitted.                     Expected Discharge Plan and Services                          SNF                      Social Drivers of Health (SDOH) Interventions SDOH Screenings   Food Insecurity: No Food Insecurity (03/18/2024)  Housing: Low Risk  (03/18/2024)  Transportation Needs: No Transportation Needs (03/18/2024)  Utilities: Not At Risk (03/18/2024)  Financial Resource Strain: Low Risk  (01/13/2023)   Received from [facility]  Social Connections: Patient Declined (03/18/2024)  Tobacco Use: Low Risk  (03/18/2024)    Readmission Risk Interventions     No data to display

## 2024-03-21 NOTE — Progress Notes (Signed)
 Occupational Therapy Treatment Patient Details Name: Jacqueline Lozano MRN: [id number] DOB: 01/10/1935 Today's Date: 03/21/2024   History of present illness Jacqueline Lozano is a 89 y.o. female with medical history significant for Permanent A-fib s/p ablation on Eliquis , complete heart block s/p PPM , HTN, HFpEF, hypothyroidism, aortic valve stenosis being admitted with community-acquired pneumonia with a possible element of CHF.   OT comments  Patient seen for OT treatment on this date. Upon arrival to room patient semi fowlers in bed asleep, easily aroused and pleasant; patients speech is difficult to understand but she nodded yes when asked if she would work with therapy. OT provided max A to transition to EOB, patient tolerated EOB for 10 minutes with continuous steadying A to maintain balance as patient is deconditioned and presents with posterior lean. OT attempted to have patient engage in seated grooming tasks (washing hands, washing face) with patient able to follow simple one step command <10% of the time and therefore required max A to complete tasks. Patient reports I'm tired after 10 minutes, RN presnt to assist with back to bed, total A x 2 for transferring EOB to supine and back up in bed. Patient ended treatment semi fowlers in bed with bed alarm on and all needs within reach. Patient remains appropriate for continued services <3 hours a day after DC from hospital.       If plan is discharge home, recommend the following:  A lot of help with walking and/or transfers;A lot of help with bathing/dressing/bathroom;Assistance with cooking/housework;Assistance with feeding   Equipment Recommendations  None recommended by OT;Other (comment)    Recommendations for Other Services      Precautions / Restrictions Precautions Precautions: Fall Recall of Precautions/Restrictions: Impaired Restrictions Weight Bearing Restrictions Per Provider Order: No       Mobility Bed  Mobility Overal bed mobility: Needs Assistance Bed Mobility: Sit to Supine Rolling: Max assist   Supine to sit: Total assist, +2 for physical assistance Sit to supine: Total assist, +2 for physical assistance   General bed mobility comments: total A x 1 for supine to EOB, total A x 2 to return to supine and up in bed    Transfers                         Balance Overall balance assessment: Needs assistance Sitting-balance support: Bilateral upper extremity supported, Feet supported Sitting balance-Leahy Scale: Poor Sitting balance - Comments: continuous steadying A while reaching outside BOS                                   ADL either performed or assessed with clinical judgement   ADL Overall ADL's : Needs assistance/impaired     Grooming: Maximal assistance Grooming Details (indicate cue type and reason): attempted to have patient wash hands/face, only able to complete 25% of task, requires A to complete task                             Functional mobility during ADLs: Maximal assistance General ADL Comments: max A    Extremity/Trunk Assessment Upper Extremity Assessment Upper Extremity Assessment: Generalized weakness   Lower Extremity Assessment Lower Extremity Assessment: Generalized weakness        Vision       Perception     Praxis     Communication  Communication Communication: Impaired Factors Affecting Communication: Difficulty expressing self   Cognition Arousal: Lethargic Behavior During Therapy: WFL for tasks assessed/performed Cognition: Cognition impaired Difficult to assess due to: Hard of hearing/deaf, Impaired communication Orientation impairments: Person Awareness: Intellectual awareness intact, Intellectual awareness impaired Memory impairment (select all impairments): Short-term memory, Working [employer] fast streamer, Non-declarative long-term memory, Geneticist, molecular long-term memory Attention impairment (select first  level of impairment): Focused attention, Sustained attention Executive functioning impairment (select all impairments): Initiation, Organization, Sequencing, Reasoning, Problem solving OT - Cognition Comments: A/Ox1 self only                 Following commands: Impaired Following commands impaired: Follows one step commands inconsistently      Cueing   Cueing Techniques: Verbal cues, Tactile cues, Gestural cues, Visual cues  Exercises      Shoulder Instructions       General Comments      Pertinent Vitals/ Pain       Pain Assessment Pain Assessment: Faces Pain Score: 0-No pain  Home Living                                          Prior Functioning/Environment              Frequency  Min 2X/week        Progress Toward Goals  OT Goals(current goals can now be found in the care plan section)  Progress towards OT goals: Progressing toward goals  Acute Rehab OT Goals Patient Stated Goal: patient unable to verbalize goal OT Goal Formulation: Patient unable to participate in goal setting Time For Goal Achievement: 04/02/24 Potential to Achieve Goals: Fair ADL Goals Pt Will Perform Grooming: with supervision;standing Pt Will Perform Lower Body Dressing: with supervision;sit to/from stand Pt Will Transfer to Toilet: with supervision Pt Will Perform Toileting - Clothing Manipulation and hygiene: sitting/lateral leans;with min assist  Plan      Co-evaluation                 AM-PAC OT 6 Clicks Daily Activity     Outcome Measure   Help from another person eating meals?: A Lot Help from another person taking care of personal grooming?: A Lot Help from another person toileting, which includes using toliet, bedpan, or urinal?: Total Help from another person bathing (including washing, rinsing, drying)?: Total Help from another person to put on and taking off regular upper body clothing?: Total Help from another person to put on and  taking off regular lower body clothing?: Total 6 Click Score: 8    End of Session    OT Visit Diagnosis: Unsteadiness on feet (R26.81);Other abnormalities of gait and mobility (R26.89);Muscle weakness (generalized) (M62.81);History of falling (Z91.81);Other symptoms and signs involving cognitive function   Activity Tolerance Patient limited by lethargy   Patient Left in bed;with call bell/phone within reach;with bed alarm set   Nurse Communication Mobility status        Time: 8698-8680 OT Time Calculation (min): 18 min  Charges: OT General Charges $OT Visit: 1 Visit OT Treatments $Self Care/Home Management : 8-22 mins  Rogers Clause, OT/L MSOT, 03/21/2024

## 2024-03-21 NOTE — Care Management Important Message (Signed)
 Important Message  Patient Details  Name: HERMELA HARDT MRN: [id number] Date of Birth: [birth year]   Important Message Given:  Yes - Medicare IM     Rojelio [name] 03/21/2024, 4:41 PM

## 2024-03-21 NOTE — TOC Progression Note (Signed)
 Transition of Care [facility]) - Progression Note    Patient Details  Name: Jacqueline Lozano MRN: [id number] Date of Birth: [birth year]  Transition of [facility] CM/SW Contact  Alfonso Rummer, LCSW Phone Number: 03/21/2024, 12:55 PM  Clinical Narrative:     Jacqueline Lozano Rummer met with pt and daughter in room 235. LCSW A Niah Heinle advised pt of recommendations for skilled nursing facility. Pt and daughter declined recommendations and is requesting assistance with arranging home health physical and occupational therapy. Attending physician Dr. Von advised of patient request.                     Expected Discharge Plan and Services      Home health pt/ot                                         Social Drivers of Health (SDOH) Interventions SDOH Screenings   Food Insecurity: No Food Insecurity (03/18/2024)  Housing: Low Risk  (03/18/2024)  Transportation Needs: No Transportation Needs (03/18/2024)  Utilities: Not At Risk (03/18/2024)  Financial Resource Strain: Low Risk  (01/13/2023)   Received from [facility]  Social Connections: Patient Declined (03/18/2024)  Tobacco Use: Low Risk  (03/18/2024)    Readmission Risk Interventions     No data to display

## 2024-03-21 NOTE — Plan of Care (Signed)

## 2024-03-21 NOTE — Progress Notes (Signed)
 [facility] Progress Note  Patient: Jacqueline Lozano    FMW:[id number]  DOA: 03/18/2024     Date of Service: the patient was seen and examined on 03/21/2024  Chief Complaint  Patient presents with   Cough   Fatigue   Brief hospital course: Jacqueline Lozano is a 89 y.o. female with medical history significant for Permanent A-fib s/p ablation on Eliquis , complete heart block s/p PPM , HTN, HFpEF, hypothyroidism, aortic valve stenosis being admitted with community-acquired pneumonia with a possible element of CHF.  She presented with shortness of breath, worse on lying flat in the setting of recently completing a course of antibiotics for UTI.  She has a nonproductive cough without fever or chills.  Of late has been sleeping a lot. In the ED, Slightly hypothermic at 94.5 with otherwise normal vitals Labs with troponin 76 and BNP 163 WBC 14.4 with lactic acid 1.6, procalcitonin 1.5 and negative respiratory viral panel BMP with creatinine 1.98 above baseline of 1.6 in April EKG showed a ventricular paced rhythm at 71 CT chest without contrast showing multiple pulmonary nodules most significant solid pulmonary nodule of 6 mm with recommendation for noncontrasted CT chest in 3 to 6 months Patient was treated with cefepime, Flagyl,vancomycin and LR bolus and DuoNeb also given chewable aspirin 324 Admission requested    Assessment and Plan:   # CAP (community acquired pneumonia) Rhinovirus +ve Rocephin and azithromycin Guaifenesin DuoNebs as needed Leukocytosis, procalcitonin 1.56 Continue antibiotics for now 10/12 Solu-Medrol 40 mg IV daily x 2 doses followed by prednisone 40 mg p.o. daily x 3 doses   # AKI on CKD stage IIb Baseline creatinine 1.5, eGFR 34, sCr 2.07>2.32>>2.16 Discontinued low-dose Lasix  Started gentle hydration with IV fluids Bladder scan rule out urinary retention US  renal: Echogenic kidneys likely secondary to medical renal disease. Simple left renal  cyst.  # Urinary retention Foley catheter was inserted on 10/14, 340 mL urine was collected.   # Acute on chronic heart failure with preserved ejection fraction (HFpEF) (HCC) Mild exacerbation Orthopnea with BNP 163.  CT chest without effusion or pulmonary vascular congestion S/p Low-dose IV Lasix , discontinued on 10/13 due to elevated creatinine Continue metoprolol  TTE: LVEF 55 to 60%, severely enlarged LA and RA, small pericardial effusion.   # Incidental pulmonary nodule, > 3mm and < 8mm Follow-up per radiology with repeat CT scan in 3 to 6 months   # Permanent atrial fibrillation (HCC) # Chronic anticoagulation Continue Eliquis  and metoprolol    # Delirium Patient restless, agitated while in the emergency room Haldol administered, continue as needed Delirium precautions 10/13 as per patient's daughter Xanax did help her.  She was started Xanax 0.25 mg twice daily x 2 doses Started Seroquel 50 mg p.o. twice daily and trazodone as needed for sleep 10/14 avoid sedating medications today due to drowsiness.  Decrease Seroquel 25 mg every evening from tomorrow 10/15  # Aortic valve stenosis No acute issues   # OSA (obstructive sleep apnea) CPAP nightly if desired   # Complete heart block s/p biventricular cardiac pacemaker No acute issues suspected   # Constipation, started laxatives.   Body mass index is 33.67 kg/m.  Interventions:  Diet: Heart healthy diet DVT Prophylaxis: Eliquis   Advance goals of care discussion: DNR-limited  Family Communication: family was present at bedside, at the time of interview.  The pt provided permission to discuss medical plan with the family. Opportunity was given to ask question and all questions were answered satisfactorily.  Disposition:  Pt is from home, admitted with rhinovirus, pneumonia, AKI, AMS, still has AKI, which precludes a safe discharge. Discharge to homelessness, TBD after PT/OT eval, when stable, may need few days  to improve.  Subjective: No significant events overnight, patient is level more awake and alert as compared to yesterday. Still has mild shortness of breath, unable to offer any complaints, remained pleasantly confused.    Physical Exam: General: NAD, lying comfortably Appear in no distress, Eyes: PERRLA ENT: Oral Mucosa Clear, dry Neck: no JVD,  Cardiovascular: S1 and S2 Present, no Murmur,  Respiratory: good respiratory effort, Bilateral Air entry equal and Decreased, mild Crackles, no wheezes Abdomen: Bowel Sound present, Soft and no tenderness,  Skin: no rashes Extremities: no Pedal edema, no calf tenderness Neurologic: without any new focal findings Gait not checked due to patient safety concerns  Vitals:   03/21/24 0650 03/21/24 0840 03/21/24 0904 03/21/24 1249  BP: (!) 174/62 (!) 175/64 (!) 171/64 (!) 149/57  Pulse: 70 70 70 69  Resp: 18   18  Temp:  (!) 97.3 F (36.3 C)  98.7 F (37.1 C)  TempSrc:      SpO2: 96% 96%  96%  Weight:      Height:        Intake/Output Summary (Last 24 hours) at 03/21/2024 1552 Last data filed at 03/21/2024 0900 Gross per 24 hour  Intake 1147.31 ml  Output 540 ml  Net 607.31 ml   Filed Weights   03/19/24 0500 03/20/24 0500 03/21/24 0500  Weight: 73.3 kg 74.2 kg 78.2 kg    Data Reviewed: I have personally reviewed and interpreted daily labs, tele strips, imagings as discussed above. I reviewed all nursing notes, pharmacy notes, vitals, pertinent old records I have discussed plan of care as described above with RN and patient/family.  CBC: Recent Labs  Lab 03/18/24 0032 03/18/24 0820 03/19/24 0343 03/20/24 1044 03/21/24 0408  WBC 14.4* 17.2* 18.2* 24.0* 20.9*  HGB 13.4 12.5 12.2 11.5* 11.2*  HCT 40.9 38.2 37.0 32.6* 34.7*  MCV 92.7 91.0 92.5 87.6 92.0  PLT 311 316 236 232 248   Basic Metabolic Panel: Recent Labs  Lab 03/18/24 0032 03/18/24 0820 03/19/24 0343 03/20/24 1044 03/21/24 0408  NA 136 134* 136 141 142   K 4.7 4.6 4.8 4.4 4.2  CL 103 103 103 109 112*  CO2 21* 21* 23 20* 21*  GLUCOSE 114* 165* 96 108* 107*  BUN 52* 44* 53* 61* 68*  CREATININE 1.98* 1.83* 2.07* 2.32* 2.16*  CALCIUM 9.4 9.3 9.4 9.4 9.4  MG  --  1.4* 2.5* 2.0 2.4  PHOS  --  3.1 4.3 3.8 4.1    Studies: No results found.   Scheduled Meds:  apixaban   2.5 mg Oral BID   azithromycin  500 mg Oral Daily   bisacodyl  10 mg Oral QHS   Chlorhexidine  Gluconate Cloth  6 each Topical Daily   feeding supplement  237 mL Oral BID BM   guaiFENesin  600 mg Oral BID   levothyroxine  75 mcg Oral QAC breakfast   metoprolol  tartrate  50 mg Oral BID   pantoprazole  40 mg Oral Daily   polyethylene glycol  17 g Oral BID   predniSONE  40 mg Oral Q breakfast   QUEtiapine  25 mg Oral QPM   Continuous Infusions:  sodium chloride  75 mL/hr at 03/21/24 0441   cefTRIAXone (ROCEPHIN)  IV Stopped (03/21/24 1320)   PRN Meds: acetaminophen  **OR** acetaminophen ,  bisacodyl, haloperidol lactate, HYDROcodone-acetaminophen , ipratropium-albuterol , ondansetron  **OR** ondansetron  (ZOFRAN ) IV, senna-docusate, traZODone  Time spent: 55 minutes  Author: ELVAN SOR. MD [facility] 03/21/2024 3:52 PM  To reach On-call, see care teams to locate the attending and reach out to them via [email or web address]. If 7PM-7AM, please contact night-coverage If you still have difficulty reaching the attending provider, please page the [facility] (Director on Call) for [facility] on amion for assistance.

## 2024-03-21 NOTE — Progress Notes (Signed)
 Physical Therapy Treatment Patient Details Name: Jacqueline Lozano MRN: [id number] DOB: 1935/03/17 Today's Date: 03/21/2024   History of Present Illness Jacqueline Lozano is a 89 y.o. female with medical history significant for Permanent A-fib s/p ablation on Eliquis , complete heart block s/p PPM , HTN, HFpEF, hypothyroidism, aortic valve stenosis being admitted with community-acquired pneumonia with a possible element of CHF.    PT Comments  Patient seen for PT session focused on bed mobility. Patient required maxA for rolling in bed responding to 1 step commands slowly. Pt limited ability to participate. Vitals remained stable during activity. Main limiting factors today were pt fatigue and lethargy. Interventions aimed at improving bed mobility to reduce caregiver burden. Continued skilled PT recommended to progress toward functional goals and support discharge readiness.    If plan is discharge home, recommend the following: A lot of help with walking and/or transfers;A lot of help with bathing/dressing/bathroom   Can travel by private vehicle     No  Equipment Recommendations  None recommended by PT    Recommendations for Other Services       Precautions / Restrictions Precautions Precautions: Fall Recall of Precautions/Restrictions: Impaired Restrictions Weight Bearing Restrictions Per Provider Order: No     Mobility  Bed Mobility Overal bed mobility: Needs Assistance Bed Mobility: Rolling (x2; increased time for each rep.) Rolling: Max assist         General bed mobility comments: limited ability to participate due to drowsiness and lethargy; bed exercises performed; lower extremity heel slide with manual assistance provided x 10 each side; ankle pumps x10 each side    Transfers                        Ambulation/Gait                   Stairs             Wheelchair Mobility     Tilt Bed    Modified Rankin (Stroke Patients Only)        Balance                                            Communication Communication Communication: Impaired Factors Affecting Communication: Difficulty expressing self  Cognition                                        Cueing    Exercises      General Comments        Pertinent Vitals/Pain Pain Assessment Pain Assessment: No/denies pain    Home Living                          Prior Function            PT Goals (current goals can now be found in the care plan section) Acute Rehab PT Goals PT Goal Formulation: Patient unable to participate in goal setting Time For Goal Achievement: 04/02/24 Potential to Achieve Goals: Fair Progress towards PT goals: Progressing toward goals    Frequency    Min 3X/week      PT Plan      Co-evaluation  AM-PAC PT 6 Clicks Mobility   Outcome Measure  Help needed turning from your back to your side while in a flat bed without using bedrails?: A Lot Help needed moving from lying on your back to sitting on the side of a flat bed without using bedrails?: A Lot Help needed moving to and from a bed to a chair (including a wheelchair)?: A Lot Help needed standing up from a chair using your arms (e.g., wheelchair or bedside chair)?: Total Help needed to walk in hospital room?: Total Help needed climbing 3-5 steps with a railing? : Total 6 Click Score: 9    End of Session Equipment Utilized During Treatment: Gait belt Activity Tolerance: Patient limited by fatigue Patient left: in chair;with chair alarm set Nurse Communication: Mobility status PT Visit Diagnosis: Unsteadiness on feet (R26.81);Other abnormalities of gait and mobility (R26.89);Muscle weakness (generalized) (M62.81)     Time: 8660-8646 PT Time Calculation (min) (ACUTE ONLY): 14 min  Charges:    $Therapeutic Exercise: 8-22 mins PT General Charges $$ ACUTE PT VISIT: 1 Visit                      Sherlean Lesches DPT, PT     Sherlean A Jacqueline Lozano 03/21/2024, 2:01 PM

## 2024-03-21 NOTE — Progress Notes (Signed)
 Initial Nutrition Assessment  DOCUMENTATION CODES:   Obesity unspecified  INTERVENTION:   -Continue dysphagia 3 diet -MVI with minerals daily -Ensure Plus High Protein po TID, each supplement provides 350 kcal and 20 grams of protein  -Feeding assistance with meals  NUTRITION DIAGNOSIS:   Inadequate oral intake related to poor appetite as evidenced by per patient/family report.  GOAL:   Patient will meet greater than or equal to 90% of their needs  MONITOR:   PO intake, Supplement acceptance, Diet advancement  REASON FOR ASSESSMENT:   Consult Assessment of nutrition requirement/status  ASSESSMENT:   89 y.o. female with medical history significant for Permanent A-fib s/p ablation on Eliquis , complete heart block s/p PPM , HTN, HFpEF, hypothyroidism, aortic valve stenosis being admitted with community-acquired pneumonia with a possible element of CHF.  She presented with shortness of breath, worse on lying flat in the setting of recently completing a course of antibiotics for UTI.  Admitted with CAP.   10/14- s/p BSE- dysphagia 3 diet with thin liquids  Reviewed I/O's: +1.7 L x 24 hours and +3.6 L since admission  UOP: 540 ml x 24 hours   Case discussed with RN, who reports pt is more alert compared to yesterday. She was able to eat some eggs for breakfast.   Spoke with patient and daughter at bedside. Daughter provided most of the history. Jacqueline Lozano was sitting up in bed and smiling, however, unable to contribute much history secondary to confusion and garbled speech. Daughter shares that this is the most alert she has been since admission. The last few days have been difficult in regarding to oral intake, as she was given sedatives secondary to confusion and behavior. Daughter shares that pt is waking up and was able to eat some breakfast and drink about half on an Ensure both today and yesterday. Noted meal completions 50-75%.   PTA, her appetite waxes and wanes.  SHe generally consumes 3 meals per day and also grazes on snacks. She has had a lot of change in her life, as she recently moved in with her daughter about 3 months ago due to her being less steady on her feet.   Weight has been stable over the past year. Daughter reports progressive wt loss over the past 3-4 months, however, unsure oif how much or her UBW. She has bilateral lower extremity edema, which may be masking further weight loss as well as fat and muscle depletions.   Discussed importance of good meal and supplement intake to promote healing. She is amenable to supplements.   Per TOC, plan for SNF placement at discharge.   Medications reviewed and include dulcolax, mineral oil, miralax, prednisone, and 0.9% sodium chloride  infusion @ 75 ml/hr.   Labs reviewed.    NUTRITION - FOCUSED PHYSICAL EXAM:  Flowsheet Row Most Recent Value  Orbital Region No depletion  Upper Arm Region Mild depletion  Thoracic and Lumbar Region No depletion  Buccal Region No depletion  Temple Region No depletion  Clavicle Bone Region No depletion  Clavicle and Acromion Bone Region No depletion  Scapular Bone Region No depletion  Dorsal Hand Mild depletion  Patellar Region No depletion  Anterior Thigh Region No depletion  Posterior Calf Region No depletion  Edema (RD Assessment) Mild  Hair Reviewed  Eyes Reviewed  Mouth Reviewed  Skin Reviewed  Nails Reviewed    Diet Order:   Diet Order             DIET DYS 3  Room service appropriate? Yes with Assist; Fluid consistency: Thin  Diet effective now                   EDUCATION NEEDS:   Education needs have been addressed  Skin:  Skin Assessment: Reviewed RN Assessment  Last BM:  UNknown  Height:   Ht Readings from Last 1 Encounters:  03/18/24 5' (1.524 m)    Weight:   Wt Readings from Last 1 Encounters:  03/21/24 78.2 kg    Ideal Body Weight:  45.5 kg  BMI:  Body mass index is 33.67 kg/m.  Estimated Nutritional Needs:    Kcal:  1600-1800  Protein:  85-100 grams  Fluid:  1.6-1.8 L    Margery ORN, RD, LDN, CDCES Registered Dietitian III Certified Diabetes Care and Education Specialist If unable to reach this RD, please use RD Inpatient group chat on secure chat between hours of 8am-4 pm daily

## 2024-03-22 ENCOUNTER — Inpatient Hospital Stay

## 2024-03-22 ENCOUNTER — Ambulatory Visit: Payer: Medicare Other

## 2024-03-22 DIAGNOSIS — I5032 Chronic diastolic (congestive) heart failure: Secondary | ICD-10-CM

## 2024-03-22 DIAGNOSIS — J189 Pneumonia, unspecified organism: Secondary | ICD-10-CM | POA: Diagnosis not present

## 2024-03-22 LAB — BASIC METABOLIC PANEL WITH GFR
Anion gap: 12 (ref 5–15)
BUN: 67 mg/dL — ABNORMAL HIGH (ref 8–23)
CO2: 20 mmol/L — ABNORMAL LOW (ref 22–32)
Calcium: 9.5 mg/dL (ref 8.9–10.3)
Chloride: 114 mmol/L — ABNORMAL HIGH (ref 98–111)
Creatinine, Ser: 1.84 mg/dL — ABNORMAL HIGH (ref 0.44–1.00)
GFR, Estimated: 26 mL/min — ABNORMAL LOW (ref >60)
Glucose, Bld: 110 mg/dL — ABNORMAL HIGH (ref 70–99)
Potassium: 4.3 mmol/L (ref 3.5–5.1)
Sodium: 146 mmol/L — ABNORMAL HIGH (ref 135–145)

## 2024-03-22 LAB — PHOSPHORUS: Phosphorus: 3.4 mg/dL (ref 2.5–4.6)

## 2024-03-22 LAB — CBC
HCT: 33.8 % — ABNORMAL LOW (ref 36.0–46.0)
Hemoglobin: 10.9 g/dL — ABNORMAL LOW (ref 12.0–15.0)
MCH: 30.3 pg (ref 26.0–34.0)
MCHC: 32.2 g/dL (ref 30.0–36.0)
MCV: 93.9 fL (ref 80.0–100.0)
Platelets: 231 K/uL (ref 150–400)
RBC: 3.6 MIL/uL — ABNORMAL LOW (ref 3.87–5.11)
RDW: 14.9 % (ref 11.5–15.5)
WBC: 17.4 K/uL — ABNORMAL HIGH (ref 4.0–10.5)
nRBC: 0 % (ref 0.0–0.2)

## 2024-03-22 LAB — GLUCOSE, CAPILLARY: Glucose-Capillary: 121 mg/dL — ABNORMAL HIGH (ref 70–99)

## 2024-03-22 LAB — MAGNESIUM: Magnesium: 2.3 mg/dL (ref 1.7–2.4)

## 2024-03-22 MED ORDER — SODIUM CHLORIDE 0.45 % IV SOLN: INTRAVENOUS | Status: AC

## 2024-03-22 MED ORDER — QUETIAPINE FUMARATE 25 MG PO TABS
25.0000 mg | ORAL_TABLET | Freq: Every evening | ORAL | Status: DC
  Administered 2024-03-23: 25 mg via ORAL
  Filled 2024-03-22: qty 1

## 2024-03-22 MED ORDER — HYDRALAZINE HCL 50 MG PO TABS
50.0000 mg | ORAL_TABLET | Freq: Three times a day (TID) | ORAL | Status: DC
  Administered 2024-03-22 – 2024-03-23 (×4): 50 mg via ORAL
  Filled 2024-03-22 (×5): qty 1

## 2024-03-22 MED ORDER — METOPROLOL TARTRATE 50 MG PO TABS
75.0000 mg | ORAL_TABLET | Freq: Two times a day (BID) | ORAL | Status: DC
  Administered 2024-03-22 – 2024-03-24 (×4): 75 mg via ORAL
  Filled 2024-03-22 (×4): qty 1

## 2024-03-22 MED ORDER — SODIUM BICARBONATE 650 MG PO TABS
650.0000 mg | ORAL_TABLET | Freq: Three times a day (TID) | ORAL | Status: DC
  Administered 2024-03-22 – 2024-03-24 (×6): 650 mg via ORAL
  Filled 2024-03-22 (×6): qty 1

## 2024-03-22 NOTE — Progress Notes (Signed)
 [facility] Progress Note  Patient: Jacqueline Lozano    FMW:[id number]  DOA: 03/18/2024     Date of Service: the patient was seen and examined on 03/22/2024  Chief Complaint  Patient presents with   Cough   Fatigue   Brief hospital course: Jacqueline Lozano is a 89 y.o. female with medical history significant for Permanent A-fib s/p ablation on Eliquis , complete heart block s/p PPM , HTN, HFpEF, hypothyroidism, aortic valve stenosis being admitted with community-acquired pneumonia with a possible element of CHF.  She presented with shortness of breath, worse on lying flat in the setting of recently completing a course of antibiotics for UTI.  She has a nonproductive cough without fever or chills.  Of late has been sleeping a lot. In the ED, Slightly hypothermic at 94.5 with otherwise normal vitals Labs with troponin 76 and BNP 163 WBC 14.4 with lactic acid 1.6, procalcitonin 1.5 and negative respiratory viral panel BMP with creatinine 1.98 above baseline of 1.6 in April EKG showed a ventricular paced rhythm at 71 CT chest without contrast showing multiple pulmonary nodules most significant solid pulmonary nodule of 6 mm with recommendation for noncontrasted CT chest in 3 to 6 months Patient was treated with cefepime, Flagyl,vancomycin and LR bolus and DuoNeb also given chewable aspirin 324 Admission requested    Assessment and Plan:   # CAP (community acquired pneumonia) Rhinovirus +ve S/p Rocephin and azithromycin, finished antibiotic course Guaifenesin 600 twice daily DuoNebs as needed Leukocytosis, procalcitonin 1.56 S/p Antibiotics as above  10/12 Solu-Medrol 40 mg IV daily x 2 doses followed by prednisone 40 mg p.o. daily x 3 doses.  Completed Clinically stable, saturating well on room air   # AKI on CKD stage IIb Baseline creatinine 1.5, eGFR 34, sCr 2.07>2.32>>2.16>> 1.84 gradually improving Discontinued low-dose Lasix  Started gentle hydration with IV  fluids Bladder scan rule out urinary retention US  renal: Echogenic kidneys likely secondary to medical renal disease. Simple left renal cyst.  # Urinary retention Foley catheter was inserted on 10/14, 340 mL urine was collected.   # Acute on chronic heart failure with preserved ejection fraction (HFpEF) (HCC) Mild exacerbation Orthopnea with BNP 163.  CT chest without effusion or pulmonary vascular congestion S/p Low-dose IV Lasix , discontinued on 10/13 due to elevated creatinine Continue metoprolol  TTE: LVEF 55 to 60%, severely enlarged LA and RA, small pericardial effusion.   # Incidental pulmonary nodule, > 3mm and < 8mm Follow-up per radiology with repeat CT scan in 3 to 6 months   # Permanent atrial fibrillation (HCC) # Chronic anticoagulation Continue Eliquis  and metoprolol    # Delirium Patient restless, agitated while in the emergency room Haldol administered, continue as needed Delirium precautions 10/13 as per patient's daughter Xanax did help her.  She was started Xanax 0.25 mg twice daily x 2 doses Started Seroquel 50 mg p.o. twice daily and trazodone as needed for sleep 10/14 avoid sedating medications today due to drowsiness.  Decrease Seroquel 25 mg every evening from tomorrow 10/15  # Aortic valve stenosis No acute issues   # OSA (obstructive sleep apnea) CPAP nightly if desired   # Complete heart block s/p biventricular cardiac pacemaker No acute issues suspected   # Constipation, started laxatives.   Body mass index is 32.77 kg/m.  Interventions:  Diet: Heart healthy diet DVT Prophylaxis: Eliquis   Advance goals of care discussion: DNR-limited  Family Communication: family was present at bedside, at the time of interview.  The pt provided permission to  discuss medical plan with the family. Opportunity was given to ask question and all questions were answered satisfactorily.   Disposition:  Pt is from home, admitted with rhinovirus, pneumonia,  AKI, AMS, still has AKI, which precludes a safe discharge. Discharge to SNF when stable, most likely in 1 to 2 days. TOC consulted, work in progress for [employer]   Subjective: No significant events overnight, patient was given oxycodone in the morning that she was dozing off as per patient's daughter.  Patient is moving bowels, no any other active issues. Patient daughter was at bedside, management plan discussed.    Physical Exam: General: NAD, lying comfortably Appear in no distress, Eyes: PERRLA ENT: Oral Mucosa Clear, dry Neck: no JVD,  Cardiovascular: S1 and S2 Present, no Murmur,  Respiratory: good respiratory effort, Bilateral Air entry equal and Decreased, mild Crackles, no wheezes Abdomen: Bowel Sound present, Soft and no tenderness,  Skin: no rashes Extremities: no Pedal edema, no calf tenderness Neurologic: without any new focal findings Gait not checked due to patient safety concerns  Vitals:   03/22/24 0458 03/22/24 0500 03/22/24 0818 03/22/24 1056  BP: (!) 142/94  (!) 178/78 (!) 111/90  Pulse: 70  70 72  Resp: 16     Temp: 97.7 F (36.5 C)  (!) 97.5 F (36.4 C) 98.1 F (36.7 C)  TempSrc:   Axillary Axillary  SpO2: 93%  95% 96%  Weight:  76.1 kg    Height:        Intake/Output Summary (Last 24 hours) at 03/22/2024 1634 Last data filed at 03/22/2024 1426 Gross per 24 hour  Intake 240 ml  Output 450 ml  Net -210 ml   Filed Weights   03/20/24 0500 03/21/24 0500 03/22/24 0500  Weight: 74.2 kg 78.2 kg 76.1 kg    Data Reviewed: I have personally reviewed and interpreted daily labs, tele strips, imagings as discussed above. I reviewed all nursing notes, pharmacy notes, vitals, pertinent old records I have discussed plan of care as described above with RN and patient/family.  CBC: Recent Labs  Lab 03/18/24 0820 03/19/24 0343 03/20/24 1044 03/21/24 0408 03/22/24 0547  WBC 17.2* 18.2* 24.0* 20.9* 17.4*  HGB 12.5 12.2 11.5* 11.2* 10.9*  HCT 38.2  37.0 32.6* 34.7* 33.8*  MCV 91.0 92.5 87.6 92.0 93.9  PLT 316 236 232 248 231   Basic Metabolic Panel: Recent Labs  Lab 03/18/24 0820 03/19/24 0343 03/20/24 1044 03/21/24 0408 03/22/24 0547  NA 134* 136 141 142 146*  K 4.6 4.8 4.4 4.2 4.3  CL 103 103 109 112* 114*  CO2 21* 23 20* 21* 20*  GLUCOSE 165* 96 108* 107* 110*  BUN 44* 53* 61* 68* 67*  CREATININE 1.83* 2.07* 2.32* 2.16* 1.84*  CALCIUM 9.3 9.4 9.4 9.4 9.5  MG 1.4* 2.5* 2.0 2.4 2.3  PHOS 3.1 4.3 3.8 4.1 3.4    Studies: DG Abd 1 View Result Date: 03/22/2024 EXAM: 1 VIEW XRAY OF THE ABDOMEN 03/22/2024 10:36:04 AM COMPARISON: Comparison with prior exam from yesterday. CLINICAL HISTORY: SBO (small bowel obstruction) (HCC). FINDINGS: BOWEL: Small bowel dilatation is resolved compared to prior exam. Nonobstructive bowel gas pattern. SOFT TISSUES: No opaque urinary calculi. BONES: No acute osseous abnormality. IMPRESSION: 1. No bowel obstruction Electronically signed by: Lynwood Seip MD 03/22/2024 10:45 AM EDT RP Workstation: [id number]   DG Abd 1 View Result Date: 03/21/2024 EXAM: 1 VIEW XRAY OF THE ABDOMEN 03/21/2024 05:02:50 PM COMPARISON: CT abdomen and pelvis 2013. CLINICAL HISTORY: Constipation. FINDINGS:  LINES, TUBES AND DEVICES: Posterior wires are present in the heart. BOWEL: Diffuse gaseous distention of colon seen to the level of the rectum. There are some borderline dilated loops of small bowel in the mid abdomen. Stomach is moderately distended with air. SOFT TISSUES: There are phleboliths in the pelvis. No opaque urinary calculi. BONES: Degenerative changes affect the spine. No acute osseous abnormality. IMPRESSION: 1. Diffuse gaseous distention of the bowel favors ileus. 2. Borderline dilated mid-abdominal small bowel loops, which may reflect early or partial small bowel obstruction; recommend close clinical and imaging follow-up if symptoms persist or worsen. Electronically signed by: Greig Pique MD 03/21/2024 05:23 PM  EDT RP Workstation: [id number]     Scheduled Meds:  apixaban   2.5 mg Oral BID   bisacodyl  10 mg Oral QHS   Chlorhexidine  Gluconate Cloth  6 each Topical Daily   feeding supplement  237 mL Oral TID BM   guaiFENesin  600 mg Oral BID   hydrALAZINE  50 mg Oral Q8H   levothyroxine  75 mcg Oral QAC breakfast   metoprolol  tartrate  75 mg Oral BID   mineral oil  1 enema Rectal Once   multivitamin with minerals  1 tablet Oral Daily   pantoprazole  40 mg Oral Daily   polyethylene glycol  17 g Oral BID   [START ON 03/23/2024] QUEtiapine  25 mg Oral QPM   sodium bicarbonate  650 mg Oral TID   Continuous Infusions:  sodium chloride  75 mL/hr at 03/22/24 1313   PRN Meds: acetaminophen  **OR** acetaminophen , bisacodyl, haloperidol lactate, ipratropium-albuterol , ondansetron  **OR** ondansetron  (ZOFRAN ) IV, senna-docusate, traZODone  Time spent: 40 minutes  Author: ELVAN SOR. MD [facility] 03/22/2024 4:34 PM  To reach On-call, see care teams to locate the attending and reach out to them via [email or web address]. If 7PM-7AM, please contact night-coverage If you still have difficulty reaching the attending provider, please page the [facility] (Director on Call) for [facility] on amion for assistance.

## 2024-03-23 ENCOUNTER — Telehealth: Payer: Self-pay

## 2024-03-23 DIAGNOSIS — J189 Pneumonia, unspecified organism: Secondary | ICD-10-CM | POA: Diagnosis not present

## 2024-03-23 LAB — PHOSPHORUS: Phosphorus: 3.1 mg/dL (ref 2.5–4.6)

## 2024-03-23 LAB — URINALYSIS, COMPLETE (UACMP) WITH MICROSCOPIC
Bilirubin Urine: NEGATIVE
Glucose, UA: NEGATIVE mg/dL
Ketones, ur: NEGATIVE mg/dL
Leukocytes,Ua: NEGATIVE
Nitrite: NEGATIVE
Protein, ur: 30 mg/dL — AB
RBC / HPF: >50 RBC/hpf (ref 0–5)
Specific Gravity, Urine: 1.021 (ref 1.005–1.030)
pH: 5 (ref 5.0–8.0)

## 2024-03-23 LAB — CBC
HCT: 33.8 % — ABNORMAL LOW (ref 36.0–46.0)
Hemoglobin: 11.1 g/dL — ABNORMAL LOW (ref 12.0–15.0)
MCH: 30.7 pg (ref 26.0–34.0)
MCHC: 32.8 g/dL (ref 30.0–36.0)
MCV: 93.4 fL (ref 80.0–100.0)
Platelets: 241 K/uL (ref 150–400)
RBC: 3.62 MIL/uL — ABNORMAL LOW (ref 3.87–5.11)
RDW: 15.1 % (ref 11.5–15.5)
WBC: 20.4 K/uL — ABNORMAL HIGH (ref 4.0–10.5)
nRBC: 0 % (ref 0.0–0.2)

## 2024-03-23 LAB — GLUCOSE, CAPILLARY: Glucose-Capillary: 101 mg/dL — ABNORMAL HIGH (ref 70–99)

## 2024-03-23 LAB — MAGNESIUM: Magnesium: 2.4 mg/dL (ref 1.7–2.4)

## 2024-03-23 LAB — BASIC METABOLIC PANEL WITH GFR
Anion gap: 11 (ref 5–15)
BUN: 62 mg/dL — ABNORMAL HIGH (ref 8–23)
CO2: 19 mmol/L — ABNORMAL LOW (ref 22–32)
Calcium: 9.6 mg/dL (ref 8.9–10.3)
Chloride: 115 mmol/L — ABNORMAL HIGH (ref 98–111)
Creatinine, Ser: 1.52 mg/dL — ABNORMAL HIGH (ref 0.44–1.00)
GFR, Estimated: 33 mL/min — ABNORMAL LOW (ref >60)
Glucose, Bld: 102 mg/dL — ABNORMAL HIGH (ref 70–99)
Potassium: 4.2 mmol/L (ref 3.5–5.1)
Sodium: 145 mmol/L (ref 135–145)

## 2024-03-23 LAB — CULTURE, BLOOD (ROUTINE X 2)
Culture: NO GROWTH
Culture: NO GROWTH

## 2024-03-23 MED ORDER — SODIUM CHLORIDE 0.45 % IV SOLN: INTRAVENOUS | Status: AC

## 2024-03-23 NOTE — Telephone Encounter (Signed)
 Spoke w/ patient daughter regarding missed transmission. Patient daughter confirms transmission was missed d/t patient being hospitalized. States monitor is currently at patient home. Daughter states the current plan for patient is to discharge to rehab facility for 2 weeks post discharge. Advised to take monitor to rehab facility and plug in near where patient will sleep. Verbalized understanding.   Informed to contact device clinic for any further questions or concerns. Appreciative for call.

## 2024-03-23 NOTE — Progress Notes (Signed)
 Physical Therapy Treatment Patient Details Name: Jacqueline Lozano MRN: [id number] DOB: 1935-03-22 Today's Date: 03/23/2024   History of Present Illness Jacqueline Lozano is a 89 y.o. female with medical history significant for Permanent A-fib s/p ablation on Eliquis , complete heart block s/p PPM , HTN, HFpEF, hypothyroidism, aortic valve stenosis being admitted with community-acquired pneumonia with a possible element of CHF.    PT Comments  Pt seen for PT tx with daughter present & assisting throughout session, pt agreeable & eager to sit EOB. Pt requires mod<>max assist for supine>sit, max assist for sit>supine. Pt does attempt sit<>stand x 3 from elevated EOB with mod assist, cuing re: hand placement, assistance to power up. Pt unable to fully clear buttocks, come to full upright standing, forward trunk flexion throughout, decreased safety awareness re: hand placement as pt reaching for recliner armrests at times. Pt unable to progress to transferring to recliner. Pt requires seated rest break between each transfer attempt, states wait a minute or I can't & requires encouragement. Pt with small smear BM, PT provided assistance for peri hygiene. Continue to recommend post acute rehab <3 hours therapy/day upon d/c.    If plan is discharge home, recommend the following: A lot of help with walking and/or transfers;A lot of help with bathing/dressing/bathroom   Can travel by private vehicle     No  Equipment Recommendations  Other (comment) (defer to next venue)    Recommendations for Other Services       Precautions / Restrictions Precautions Precautions: Fall Restrictions Weight Bearing Restrictions Per Provider Order: No     Mobility  Bed Mobility Overal bed mobility: Needs Assistance Bed Mobility: Supine to Sit     Supine to sit: Max assist, Mod assist, HOB elevated, Used rails (exit R side of bed) Sit to supine: Max assist   General bed mobility comments: +2 to scoot to  [facility]    Transfers Overall transfer level: Needs assistance Equipment used: Rolling walker (2 wheels) Transfers: Sit to/from Stand Sit to Stand: Mod assist           General transfer comment: attempted sit<>stand x 3 from EOB with increased bed height, cuing re: hand placement, pt reaching for recliner armrest vs RW, almost fully clears buttocks but does not come to full upright standing, trunk flexion over onto RW, unable to progress to recliner    Ambulation/Gait                   Stairs             Wheelchair Mobility     Tilt Bed    Modified Rankin (Stroke Patients Only)       Balance Overall balance assessment: Needs assistance Sitting-balance support: Feet supported, Bilateral upper extremity supported Sitting balance-Leahy Scale: Poor     Standing balance support: During functional activity, Bilateral upper extremity supported, Reliant on assistive device for balance Standing balance-Leahy Scale: Zero                              Communication Communication Communication: Impaired Factors Affecting Communication: Reduced clarity of speech;Hearing impaired  Cognition Arousal: Alert Behavior During Therapy: Flat affect, Anxious   PT - Cognitive impairments: History of cognitive impairments                         Following commands: Impaired Following commands impaired: Follows one step commands inconsistently,  Follows one step commands with increased time    Cueing Cueing Techniques: Verbal cues  Exercises      General Comments General comments (skin integrity, edema, etc.): SpO2 >/= 90% on room air      Pertinent Vitals/Pain Pain Assessment Pain Assessment: Faces Faces Pain Scale: Hurts a little bit Pain Location: catheter site Pain Descriptors / Indicators: Discomfort Pain Intervention(s): Monitored during session    Home Living                          Prior Function            PT Goals  (current goals can now be found in the care plan section) Acute Rehab PT Goals PT Goal Formulation: With patient/family Time For Goal Achievement: 04/02/24 Potential to Achieve Goals: Fair Progress towards PT goals: Progressing toward goals    Frequency    Min 2X/week      PT Plan      Co-evaluation              AM-PAC PT 6 Clicks Mobility   Outcome Measure  Help needed turning from your back to your side while in a flat bed without using bedrails?: A Lot Help needed moving from lying on your back to sitting on the side of a flat bed without using bedrails?: Total Help needed moving to and from a bed to a chair (including a wheelchair)?: Total Help needed standing up from a chair using your arms (e.g., wheelchair or bedside chair)?: Total Help needed to walk in hospital room?: Total Help needed climbing 3-5 steps with a railing? : Total 6 Click Score: 7    End of Session Equipment Utilized During Treatment: Gait belt Activity Tolerance: Patient limited by fatigue Patient left: in bed;with call bell/phone within reach;with bed alarm set;with nursing/sitter in room;with family/visitor present (in chair position) Nurse Communication: Mobility status PT Visit Diagnosis: Unsteadiness on feet (R26.81);Other abnormalities of gait and mobility (R26.89);Muscle weakness (generalized) (M62.81);Difficulty in walking, not elsewhere classified (R26.2)     Time: 8765-8740 PT Time Calculation (min) (ACUTE ONLY): 25 min  Charges:    $Therapeutic Activity: 23-37 mins PT General Charges $$ ACUTE PT VISIT: 1 Visit                     Richerd Pinal, PT, DPT 03/23/24, 1:06 PM   Richerd CHRISTELLA Pinal 03/23/2024, 1:04 PM

## 2024-03-23 NOTE — Progress Notes (Signed)
 With each medication administration, it is more and more difficult to convince the pt to take her scheduled meds. Even the PRN pain medications she ASKED for was difficult to administer as she kept saying that she has had enough after each pill was swallowed.

## 2024-03-23 NOTE — Progress Notes (Signed)
 Occupational Therapy Treatment Patient Details Name: Jacqueline Lozano MRN: [id number] DOB: [birth year] Today's Date: 03/23/2024   History of present illness Jacqueline Lozano is a 89 y.o. female with medical history significant for Permanent A-fib s/p ablation on Eliquis , complete heart block s/p PPM , HTN, HFpEF, hypothyroidism, aortic valve stenosis being admitted with community-acquired pneumonia with a possible element of CHF.   OT comments  Pt/family educated in pressure relief/wound prevention, improving midline positioning for safety with eating/drinking, environmental modifications to improve alertness for meals, and delirium prevention to maximize pt's safety. Pt required MAX A for rolling with VC for holding onto bed rail. Pillow positioned under R hip for pressure relief and notable improvement in midline positioning in bed. Family note pt tends to lean to her R side. Pt continues to benefit from skilled OT services to maximize return to PLOF in order to return home with daughter.       If plan is discharge home, recommend the following:  A lot of help with bathing/dressing/bathroom;Assistance with cooking/housework;Assistance with feeding;Two people to help with walking and/or transfers   Equipment Recommendations  Other (comment) (defer)    Recommendations for Other Services      Precautions / Restrictions Precautions Precautions: Fall Recall of Precautions/Restrictions: Impaired Restrictions Weight Bearing Restrictions Per Provider Order: No       Mobility Bed Mobility Overal bed mobility: Needs Assistance Bed Mobility: Rolling Rolling: Max assist, Used rails         General bed mobility comments: cues for use of bed rail to maintain sidelying    Transfers                         Balance                                           ADL either performed or assessed with clinical judgement   ADL                                               Extremity/Trunk Assessment              Vision       Perception     Praxis     Communication Communication Communication: Impaired Factors Affecting Communication: Reduced clarity of speech   Cognition Arousal: Alert Behavior During Therapy: WFL for tasks assessed/performed Cognition: Cognition impaired                               Following commands: Impaired Following commands impaired: Follows one step commands inconsistently, Follows one step commands with increased time      Cueing   Cueing Techniques: Verbal cues, Tactile cues, Gestural cues, Visual cues  Exercises Other Exercises Other Exercises: Pt/family educated in pressure relief/wound prevention, improving midline positioning for safety with eating/drinking, environmental modifications to improve alertness for meals, and delirium prevention to maximize pt's safety.    Shoulder Instructions       General Comments      Pertinent Vitals/ Pain       Pain Assessment Pain Assessment: No/denies pain  Home Living  Prior Functioning/Environment              Frequency  Min 2X/week        Progress Toward Goals  OT Goals(current goals can now be found in the care plan section)  Progress towards OT goals: Progressing toward goals  Acute Rehab OT Goals Patient Stated Goal: pt unable to verbalize goal OT Goal Formulation: Patient unable to participate in goal setting Time For Goal Achievement: 04/02/24 Potential to Achieve Goals: Fair  Plan      Co-evaluation                 AM-PAC OT 6 Clicks Daily Activity     Outcome Measure   Help from another person eating meals?: A Lot Help from another person taking care of personal grooming?: A Lot Help from another person toileting, which includes using toliet, bedpan, or urinal?: Total Help from another person bathing (including washing,  rinsing, drying)?: Total Help from another person to put on and taking off regular upper body clothing?: Total Help from another person to put on and taking off regular lower body clothing?: Total 6 Click Score: 8    End of Session    OT Visit Diagnosis: Unsteadiness on feet (R26.81);Other abnormalities of gait and mobility (R26.89);Muscle weakness (generalized) (M62.81);History of falling (Z91.81);Other symptoms and signs involving cognitive function   Activity Tolerance Patient tolerated treatment well   Patient Left in bed;with call bell/phone within reach;with bed alarm set;with family/visitor present   Nurse Communication          Time: 8977-8955 OT Time Calculation (min): 22 min  Charges: OT General Charges $OT Visit: 1 Visit OT Treatments $Therapeutic Activity: 8-22 mins  Warren SAUNDERS., MPH, MS, OTR/L ascom [phone] 03/23/24, 12:39 PM

## 2024-03-23 NOTE — Progress Notes (Signed)
 Progress Note   Patient: Jacqueline Lozano FMW:[id number] DOB: 10-27-1934 DOA: 03/18/2024     4 DOS: the patient was seen and examined on 03/23/2024  Brief Hospital course: Jacqueline Lozano is a 89 y.o. female with medical history significant for Permanent A-fib s/p ablation on Eliquis , complete heart block s/p PPM , HTN, HFpEF, hypothyroidism, aortic valve stenosis being admitted with community-acquired pneumonia with a possible element of CHF.  She presented with shortness of breath, worse on lying flat in the setting of recently completing a course of antibiotics for UTI.  She has a nonproductive cough without fever or chills.  Of late has been sleeping a lot. In the ED, Slightly hypothermic at 94.5 with otherwise normal vitals Labs with troponin 76 and BNP 163 WBC 14.4 with lactic acid 1.6, procalcitonin 1.5 and negative respiratory viral panel BMP with creatinine 1.98 above baseline of 1.6 in April EKG showed a ventricular paced rhythm at 71 CT chest without contrast showing multiple pulmonary nodules most significant solid pulmonary nodule of 6 mm with recommendation for noncontrasted CT chest in 3 to 6 months Patient was treated with cefepime, Flagyl,vancomycin and LR bolus and DuoNeb also given chewable aspirin 324 Admission requested      Assessment and Plan:   # CAP (community acquired pneumonia) Rhinovirus +ve S/p Rocephin and azithromycin, finished antibiotic course Guaifenesin 600 twice daily DuoNebs as needed Leukocytosis, procalcitonin 1.56 S/p Antibiotics as above  10/12 Solu-Medrol 40 mg IV daily x 2 doses followed by prednisone 40 mg p.o. daily x 3 doses.  Completed Clinically stable, saturating well on room air     # AKI on CKD stage IIb due to dehydration Baseline creatinine 1.5, eGFR 34, Renal function improving Discontinued low-dose Lasix  Continue IV fluid Bladder scan rule out urinary retention US  renal: Echogenic kidneys likely secondary to medical renal  disease. Simple left renal cyst.   # Urinary retention Foley catheter was inserted on 10/14, 340 mL urine was collected.     # Acute on chronic heart failure with preserved ejection fraction (HFpEF) (HCC) Mild exacerbation Orthopnea with BNP 163.  CT chest without effusion or pulmonary vascular congestion S/p Low-dose IV Lasix , discontinued on 10/13 due to elevated creatinine Continue metoprolol  TTE: LVEF 55 to 60%, severely enlarged LA and RA, small pericardial effusion.   # Incidental pulmonary nodule, > 3mm and < 8mm Follow-up per radiology with repeat CT scan in 3 to 6 months   # Permanent atrial fibrillation (HCC) # Chronic anticoagulation Continue Eliquis  and metoprolol    # Delirium Patient restless, agitated while in the emergency room Haldol administered, continue as needed Delirium precautions 10/13 as per patient's daughter Xanax did help her.  She was started Xanax 0.25 mg twice daily x 2 doses Started Seroquel 50 mg p.o. twice daily and trazodone as needed for sleep 10/14 avoid sedating medications today due to drowsiness.  Continue Seroquel   # Aortic valve stenosis No acute issues   # OSA (obstructive sleep apnea) CPAP nightly if desired   # Complete heart block s/p biventricular cardiac pacemaker No acute issues suspected   # Constipation, started laxatives.     Body mass index is 32.77 kg/m.  Interventions:   Diet: Heart healthy diet DVT Prophylaxis: Eliquis    Advance goals of care discussion: DNR-limited   Family Communication: family was present at bedside, at the time of interview.  The pt provided permission to discuss medical plan with the family. Opportunity was given to ask question and all  questions were answered satisfactorily.    Disposition:  Pt is from home, admitted with rhinovirus, pneumonia, AKI, AMS, still has AKI, which precludes a safe discharge. Discharge to SNF when stable, most likely in 1 to 2 days. TOC consulted, work in  progress for [employer]     Subjective:  Patient seen and examined in the presence of the daughter Admits to improvement Appears clinically dehydrated so IV fluid ordered       Physical Exam: General: NAD, lying comfortably Appear in no distress, Eyes: PERRLA ENT: Oral Mucosa Clear, dry Neck: no JVD,  Cardiovascular: S1 and S2 Present, no Murmur,  Respiratory: good respiratory effort, Bilateral Air entry equal and Decreased, mild Crackles, no wheezes Abdomen: Bowel Sound present, Soft and no tenderness,  Skin: no rashes Extremities: no Pedal edema, no calf tenderness Neurologic: without any new focal findings Gait not checked due to patient safety concerns    Data Reviewed:     Latest Ref Rng & Units 03/23/2024    3:53 AM 03/22/2024    5:47 AM 03/21/2024    4:08 AM  CBC  WBC 4.0 - 10.5 K/uL 20.4  17.4  20.9   Hemoglobin 12.0 - 15.0 g/dL 88.8  89.0  88.7   Hematocrit 36.0 - 46.0 % 33.8  33.8  34.7   Platelets 150 - 400 K/uL 241  231  248        Latest Ref Rng & Units 03/23/2024    6:30 AM 03/22/2024    5:47 AM 03/21/2024    4:08 AM  BMP  Glucose 70 - 99 mg/dL 897  889  892   BUN 8 - 23 mg/dL 62  67  68   Creatinine 0.44 - 1.00 mg/dL 8.47  8.15  7.83   Sodium 135 - 145 mmol/L 145  146  142   Potassium 3.5 - 5.1 mmol/L 4.2  4.3  4.2   Chloride 98 - 111 mmol/L 115  114  112   CO2 22 - 32 mmol/L 19  20  21    Calcium 8.9 - 10.3 mg/dL 9.6  9.5  9.4     Vitals:   03/23/24 0520 03/23/24 0522 03/23/24 0809 03/23/24 1215  BP: 135/61  (!) 142/60 (!) 153/64  Pulse: 70  69 70  Resp: 20   17  Temp: 97.7 F (36.5 C)  98.1 F (36.7 C) 97.9 F (36.6 C)  TempSrc:   Axillary Oral  SpO2: 95%  98% 95%  Weight:  79.5 kg    Height:        Author: Drue ONEIDA Potter, MD 03/23/2024 5:43 PM  For on call review [email or web address].

## 2024-03-23 NOTE — TOC Progression Note (Signed)
 Transition of Care [facility]) - Progression Note    Patient Details  Name: SAHVANNAH RIESER MRN: [id number] Date of Birth: [birth year]  Transition of Care [facility]) CM/SW Contact  Alfonso Rummer, LCSW Phone Number: 03/23/2024, 4:57 PM  Clinical Narrative:      KEN DELENA Rummer met with pt daughter Maeola in room 235. Pt daughter selected [facility] for skilled nursing facility. Ins auth not required due to pt having traditional medicare. When pt is medically ready for dx pt wil transition to [facility]                    Expected Discharge Plan and Services                                               Social Drivers of Health (SDOH) Interventions SDOH Screenings   Food Insecurity: No Food Insecurity (03/18/2024)  Housing: Low Risk  (03/18/2024)  Transportation Needs: No Transportation Needs (03/18/2024)  Utilities: Not At Risk (03/18/2024)  Financial Resource Strain: Low Risk  (01/13/2023)   Received from [facility]  Social Connections: Patient Declined (03/18/2024)  Tobacco Use: Low Risk  (03/18/2024)    Readmission Risk Interventions     No data to display

## 2024-03-24 DIAGNOSIS — J189 Pneumonia, unspecified organism: Secondary | ICD-10-CM | POA: Diagnosis not present

## 2024-03-24 LAB — CBC
HCT: 32 % — ABNORMAL LOW (ref 36.0–46.0)
Hemoglobin: 10.9 g/dL — ABNORMAL LOW (ref 12.0–15.0)
MCH: 31.1 pg (ref 26.0–34.0)
MCHC: 34.1 g/dL (ref 30.0–36.0)
MCV: 91.4 fL (ref 80.0–100.0)
Platelets: 231 K/uL (ref 150–400)
RBC: 3.5 MIL/uL — ABNORMAL LOW (ref 3.87–5.11)
RDW: 15.4 % (ref 11.5–15.5)
WBC: 15.6 K/uL — ABNORMAL HIGH (ref 4.0–10.5)
nRBC: 0 % (ref 0.0–0.2)

## 2024-03-24 LAB — BASIC METABOLIC PANEL WITH GFR
Anion gap: 10 (ref 5–15)
BUN: 62 mg/dL — ABNORMAL HIGH (ref 8–23)
CO2: 20 mmol/L — ABNORMAL LOW (ref 22–32)
Calcium: 9.6 mg/dL (ref 8.9–10.3)
Chloride: 111 mmol/L (ref 98–111)
Creatinine, Ser: 1.59 mg/dL — ABNORMAL HIGH (ref 0.44–1.00)
GFR, Estimated: 31 mL/min — ABNORMAL LOW (ref >60)
Glucose, Bld: 90 mg/dL (ref 70–99)
Potassium: 4.4 mmol/L (ref 3.5–5.1)
Sodium: 141 mmol/L (ref 135–145)

## 2024-03-24 MED ORDER — HYDRALAZINE HCL 50 MG PO TABS: 50.0000 mg | ORAL_TABLET | Freq: Three times a day (TID) | ORAL | Status: DC

## 2024-03-24 MED ORDER — QUETIAPINE FUMARATE 25 MG PO TABS: 25.0000 mg | ORAL_TABLET | Freq: Every evening | ORAL | Status: DC

## 2024-03-24 MED ORDER — BISACODYL 5 MG PO TBEC: 10.0000 mg | DELAYED_RELEASE_TABLET | Freq: Every day | ORAL | 0 refills | Status: DC

## 2024-03-24 NOTE — TOC Transition Note (Signed)
 Transition of Care [facility]) - Discharge Note   Patient Details  Name: Jacqueline Lozano MRN: [id number] Date of Birth: 11-Apr-1935  Transition of Care [facility]) CM/SW Contact:  [place] L Suellen Durocher, LCSW Phone Number: 03/24/2024, 10:36 AM   Clinical Narrative:      Discharge Summary and orders in. Patient discharging to [facility] in [place] room 908. RN will call to report. Sherri, daughter has been notified. [facility] has been scheduled and the anticipated timeframe for pick up is 1:30pm.   No further TOC needs identified. TOC signing off.        Patient Goals and CMS Choice            Discharge Placement                       Discharge Plan and Services Additional resources added to the After Visit Summary for                                       Social Drivers of Health (SDOH) Interventions SDOH Screenings   Food Insecurity: No Food Insecurity (03/18/2024)  Housing: Low Risk  (03/18/2024)  Transportation Needs: No Transportation Needs (03/18/2024)  Utilities: Not At Risk (03/18/2024)  Financial Resource Strain: Low Risk  (01/13/2023)   Received from [facility]  Social Connections: Patient Declined (03/18/2024)  Tobacco Use: Low Risk  (03/18/2024)     Readmission Risk Interventions     No data to display

## 2024-03-24 NOTE — Progress Notes (Signed)
 AM po med administration attempted. She spit all the meds out. States.SABRASABRANo no no

## 2024-03-24 NOTE — Discharge Summary (Addendum)
 Physician Discharge Summary   Patient: Jacqueline Lozano MRN: [id number] DOB: [birth year]  Admit date:     03/18/2024  Discharge date: 03/24/24  Discharge Physician: Drue ONEIDA Potter   PCP: Valora Lynwood FALCON, MD   Recommendations at discharge:  Follow-up with PCP  Discharge Diagnoses: Principal Problem:   CAP (community acquired pneumonia) Active Problems:   Acute on chronic heart failure with preserved ejection fraction (HFpEF) (HCC)   Permanent atrial fibrillation (HCC)   Incidental pulmonary nodule, > 3mm and < 8mm   Long term current use of anticoagulant   Complete heart block s/p biventricular cardiac pacemaker   OSA (obstructive sleep apnea)   Aortic valve stenosis   Delirium  Resolved Problems:   * No resolved hospital problems. *  Hospital Course:  Jacqueline Lozano is a 89 y.o. female with medical history significant for Permanent A-fib s/p ablation on Eliquis , complete heart block s/p PPM , HTN, HFpEF, hypothyroidism, aortic valve stenosis being admitted with community-acquired pneumonia with a possible element of CHF.  She presented with shortness of breath, worse on lying flat in the setting of recently completing a course of antibiotics for UTI.  She has a nonproductive cough without fever or chills.  Of late has been sleeping a lot. In the ED, Slightly hypothermic at 94.5 with otherwise normal vitals Labs with troponin 76 and BNP 163 WBC 14.4 with lactic acid 1.6, procalcitonin 1.5 and negative respiratory viral panel BMP with creatinine 1.98 above baseline of 1.6 in April EKG showed a ventricular paced rhythm at 71 CT chest without contrast showing multiple pulmonary nodules most significant solid pulmonary nodule of 6 mm with recommendation for noncontrasted CT chest in 3 to 6 months Patient was treated with cefepime, Flagyl,vancomycin and LR bolus and DuoNeb also given chewable aspirin 324 Admission requested    Other hospital course as outlined below:  Assessment  and Plan:   # CAP (community acquired pneumonia) Rhinovirus +ve S/p Rocephin and azithromycin, finished antibiotic course Guaifenesin 600 twice daily DuoNebs as needed Leukocytosis, procalcitonin 1.56 S/p Antibiotics as above  Completed a course of steroid Clinically stable, saturating well on room air     # AKI on CKD stage IIb due to dehydration Baseline creatinine 1.5, eGFR 34, Renal function improving Received IV fluid Bladder scan rule out urinary retention US  renal: Echogenic kidneys likely secondary to medical renal disease. Simple left renal cyst.     # Acute on chronic heart failure with preserved ejection fraction (HFpEF) (HCC) Mild exacerbation Orthopnea with BNP 163.  CT chest without effusion or pulmonary vascular congestion Continue metoprolol  TTE: LVEF 55 to 60%, severely enlarged LA and RA, small pericardial effusion.   # Urinary retention Foley catheter was inserted on 10/14, 340 mL urine was collected. Outpatient follow-up with urologist  # Incidental pulmonary nodule, > 3mm and < 8mm Follow-up per radiology with repeat CT scan in 3 to 6 months   # Permanent atrial fibrillation (HCC) # Chronic anticoagulation Continue Eliquis  and metoprolol    # Delirium Resolved Delirium precautions    # Aortic valve stenosis No acute issues   # OSA (obstructive sleep apnea) CPAP nightly if desired   # Complete heart block s/p biventricular cardiac pacemaker No acute issues suspected   # Constipation Continue laxatives.  Consultants: None Procedures performed: None Disposition: Skilled nursing facility Diet recommendation:  Cardiac diet DISCHARGE MEDICATION: Allergies as of 03/24/2024       Reactions   Codeine Shortness Of Breath, Other (See Comments)  Farxiga  [dapagliflozin ] Other (See Comments)   Seizure like activity   Iodinated Contrast Media Shortness Of Breath, Other (See Comments)   Morphine Shortness Of Breath, Other (See Comments)    Other Shortness Of Breath, Nausea And Vomiting, Other (See Comments)   Contrast dye   Oxycodone Shortness Of Breath   Shellfish Protein-containing Drug Products Shortness Of Breath, Nausea And Vomiting, Rash   Dipyridamole Other (See Comments)   Unknown   Iodine Other (See Comments)   Omeprazole Other (See Comments)   Oxycodone-acetaminophen  Other (See Comments)   Statins Other (See Comments)   Unknown   Sulfa Antibiotics Other (See Comments)   Other reaction(s): Unknown   Sulfonamide Derivatives Other (See Comments)   Unknown   Amlodipine  Rash, Other (See Comments)        Medication List     STOP taking these medications    GAVISCON PO   ibuprofen 200 MG tablet Commonly known as: ADVIL   potassium chloride  10 MEQ tablet Commonly known as: KLOR-CON    traMADol  50 MG tablet Commonly known as: Ultram        TAKE these medications    acetaminophen  325 MG tablet Commonly known as: TYLENOL  Take 325 mg by mouth as needed for headache.   ASPIRIN 81 PO Take 1 tablet by mouth as needed. For headache   bisacodyl 5 MG EC tablet Commonly known as: DULCOLAX Take 2 tablets (10 mg total) by mouth at bedtime.   bismuth subsalicylate 262 MG chewable tablet Commonly known as: PEPTO BISMOL Chew 262 mg by mouth as needed.   cholecalciferol 25 MCG (1000 UNIT) tablet Commonly known as: VITAMIN D3 Take 1,000 Units by mouth daily.   cyanocobalamin 1000 MCG/ML injection Commonly known as: VITAMIN B12 Inject 1,000 mcg into the muscle every 30 (thirty) days.   diphenoxylate-atropine 2.5-0.025 MG tablet Commonly known as: LOMOTIL Take 1 tablet by mouth as needed for diarrhea or loose stools.   DRAMAMINE PO Take 1 tablet by mouth as needed.   Eliquis  2.5 MG Tabs tablet Generic drug: apixaban  Take 1 tablet by mouth twice daily   fluticasone 50 MCG/ACT nasal spray Commonly known as: FLONASE Place 2 sprays into both nostrils daily.   furosemide  20 MG tablet Commonly  known as: LASIX  TAKE 1 TABLET BY MOUTH ONCE DAILY AS DIRECTED. TAKE A SECOND TAB AFTER LUNCH IF YOU HAVE LEG SWELLING   hydrALAZINE 50 MG tablet Commonly known as: APRESOLINE Take 1 tablet (50 mg total) by mouth every 8 (eight) hours.   levothyroxine 75 MCG tablet Commonly known as: SYNTHROID Take 75 mcg by mouth daily before breakfast. What changed: Another medication with the same name was removed. Continue taking this medication, and follow the directions you see here.   MAGNESIUM GLYCINATE PO Take 1 tablet by mouth daily.   metoprolol  tartrate 50 MG tablet Commonly known as: LOPRESSOR  TAKE 1 & 1/2 (ONE & ONE-HALF) TABLETS BY MOUTH TWICE DAILY   nitroGLYCERIN  0.4 MG SL tablet Commonly known as: NITROSTAT  DISSOLVE ONE TABLET UNDER THE TONGUE EVERY 5 MINUTES AS NEEDED FOR CHEST PAIN.  DO NOT EXCEED A TOTAL OF 3 DOSES IN 15 MINUTES   omeprazole 20 MG capsule Commonly known as: PRILOSEC Take 20 mg by mouth daily.   ondansetron  4 MG disintegrating tablet Commonly known as: ZOFRAN -ODT Take 1 tablet (4 mg total) by mouth every 8 (eight) hours as needed for nausea or vomiting.   prochlorperazine  5 MG tablet Commonly known as: COMPAZINE  Take 1 tablet (5 mg total)  by mouth every 6 (six) hours as needed.   QUEtiapine 25 MG tablet Commonly known as: SEROQUEL Take 1 tablet (25 mg total) by mouth every evening.   Vitamin D (Ergocalciferol) 1.25 MG (50000 UNIT) Caps capsule Commonly known as: DRISDOL Take 50,000 Units by mouth once a week.        Contact information for follow-up providers     [facility]. Go on 03/29/2024.   Specialty: Cardiology Why: Hospital Follow-Up 03/29/24 @ 2:30  Please bring all medications to follow-up appointment [facility], [address] Second Floor Free Valet Parking at the door Contact information: [address]  [place]  [address] [phone]         Twylla Glendia BROCKS, MD. Call.   Specialty: Urology Contact information: [address] [place] [address] [phone]              Contact information for after-discharge care     Destination     [facility] .   Service: Skilled Nursing Contact information: [address] [place]  [address] [phone]                    Discharge Exam: Jacqueline Lozano   03/22/24 0500 03/23/24 0522 03/24/24 0500  Weight: 76.1 kg 79.5 kg 83.5 kg   General: NAD, lying comfortably Appear in no distress, Eyes: PERRLA ENT: Oral Mucosa Clear, dry Neck: no JVD,  Cardiovascular: S1 and S2 Present, no Murmur,  Respiratory: good respiratory effort, Bilateral Air entry equal and Decreased, mild Crackles, no wheezes Abdomen: Bowel Sound present, Soft and no tenderness,  Skin: no rashes Extremities: no Pedal edema, no calf tenderness Neurologic: without any new focal findings Gait not checked due to patient safety concerns  Condition at discharge: good  The results of significant diagnostics from this hospitalization (including imaging, microbiology, ancillary and laboratory) are listed below for reference.   Imaging Studies: DG Abd 1 View Result Date: 03/22/2024 EXAM: 1 VIEW XRAY OF THE ABDOMEN 03/22/2024 10:36:04 AM COMPARISON: Comparison with prior exam from yesterday. CLINICAL HISTORY: SBO (small bowel obstruction) (HCC). FINDINGS: BOWEL: Small bowel dilatation is resolved compared to prior exam. Nonobstructive bowel gas pattern. SOFT TISSUES: No opaque urinary calculi. BONES: No acute osseous abnormality. IMPRESSION: 1. No bowel obstruction Electronically signed by: Lynwood Seip MD 03/22/2024 10:45 AM EDT RP Workstation: [id number]   DG Abd 1 View Result Date: 03/21/2024 EXAM: 1 VIEW XRAY OF THE ABDOMEN 03/21/2024 05:02:50 PM COMPARISON: CT abdomen and pelvis 2013. CLINICAL HISTORY: Constipation. FINDINGS: LINES,  TUBES AND DEVICES: Posterior wires are present in the heart. BOWEL: Diffuse gaseous distention of colon seen to the level of the rectum. There are some borderline dilated loops of small bowel in the mid abdomen. Stomach is moderately distended with air. SOFT TISSUES: There are phleboliths in the pelvis. No opaque urinary calculi. BONES: Degenerative changes affect the spine. No acute osseous abnormality. IMPRESSION: 1. Diffuse gaseous distention of the bowel favors ileus. 2. Borderline dilated mid-abdominal small bowel loops, which may reflect early or partial small bowel obstruction; recommend close clinical and imaging follow-up if symptoms persist or worsen. Electronically signed by: Greig Pique MD 03/21/2024 05:23 PM EDT RP Workstation: [id number]   ECHOCARDIOGRAM COMPLETE Result Date: 03/18/2024    ECHOCARDIOGRAM REPORT   Patient Name:   Jacqueline Lozano Date of Exam: 03/18/2024 Medical Rec #:  [id number]  Height:       60.0 in Accession #:    [id number]        Weight:       160.0 lb Date of Birth:  July 22, 1934         BSA:          1.698 m Patient Age:    89 years          BP:           141/87 mmHg Patient Gender: F                 HR:           70 bpm. Exam Location:  [facility] Procedure: 2D Echo, Cardiac Doppler and Color Doppler (Both Spectral and Color            Flow Doppler were utilized during procedure). Indications:     CHF I50.31  History:         Patient has prior history of Echocardiogram examinations, most                  recent 03/10/2021.  Sonographer:     Thedora Louder RDCS, FASE Referring Phys:  [id number] DELAYNE LULLA SOLIAN Diagnosing Phys: Darryle Decent MD  Sonographer Comments: Technically difficult study due to poor echo windows. Image acquisition challenging due to respiratory motion. Image acquisition very challenging due to patient coughing throughout this exam. IMPRESSIONS  1. Left ventricular ejection fraction, by estimation, is 55 to 60%. The left ventricle has normal function. The  left ventricle has no regional wall motion abnormalities. There is severe left ventricular hypertrophy. Left ventricular diastolic parameters  are indeterminate.  2. Right ventricular systolic function is mildly reduced. The right ventricular size is moderately enlarged. There is normal pulmonary artery systolic pressure. The estimated right ventricular systolic pressure is 31.1 mmHg.  3. Left atrial size was severely dilated.  4. Right atrial size was severely dilated.  5. A small pericardial effusion is present. The pericardial effusion is circumferential. There is no evidence of cardiac tamponade.  6. The mitral valve is grossly normal. Trivial mitral valve regurgitation. No evidence of mitral stenosis.  7. The aortic valve is tricuspid. There is mild calcification of the aortic valve. There is mild thickening of the aortic valve. Aortic valve regurgitation is not visualized. Aortic valve sclerosis/calcification is present, without any evidence of aortic stenosis.  8. The inferior vena cava is normal in size with greater than 50% respiratory variability, suggesting right atrial pressure of 3 mmHg. Comparison(s): No significant change from prior study. FINDINGS  Left Ventricle: Left ventricular ejection fraction, by estimation, is 55 to 60%. The left ventricle has normal function. The left ventricle has no regional wall motion abnormalities. The left ventricular internal cavity size was normal in size. There is  severe left ventricular hypertrophy. Abnormal (paradoxical) septal motion, consistent with RV pacemaker. Left ventricular diastolic parameters are indeterminate. Right Ventricle: The right ventricular size is moderately enlarged. No increase in right ventricular wall thickness. Right ventricular systolic function is mildly reduced. There is normal pulmonary artery systolic pressure. The tricuspid regurgitant velocity is 2.65 m/s, and with an assumed right atrial pressure of 3 mmHg, the estimated right  ventricular systolic pressure is 31.1 mmHg. Left Atrium: Left atrial size was severely dilated. Right Atrium: Right atrial size was severely dilated. Pericardium: A small pericardial effusion is present. The pericardial effusion is circumferential. There is no evidence of cardiac tamponade. Presence of epicardial fat layer. Mitral  Valve: The mitral valve is grossly normal. Trivial mitral valve regurgitation. No evidence of mitral valve stenosis. Tricuspid Valve: The tricuspid valve is grossly normal. Tricuspid valve regurgitation is trivial. No evidence of tricuspid stenosis. Aortic Valve: The aortic valve is tricuspid. There is mild calcification of the aortic valve. There is mild thickening of the aortic valve. Aortic valve regurgitation is not visualized. Aortic valve sclerosis/calcification is present, without any evidence of aortic stenosis. Aortic valve peak gradient measures 10.6 mmHg. Pulmonic Valve: The pulmonic valve was grossly normal. Pulmonic valve regurgitation is not visualized. No evidence of pulmonic stenosis. Aorta: The aortic root and ascending aorta are structurally normal, with no evidence of dilitation. Venous: The inferior vena cava is normal in size with greater than 50% respiratory variability, suggesting right atrial pressure of 3 mmHg. IAS/Shunts: The atrial septum is grossly normal. Additional Comments: A device lead is visualized in the right atrium and right ventricle.  LEFT VENTRICLE PLAX 2D LVIDd:         3.30 cm   Diastology LVIDs:         2.40 cm   LV e' medial:    5.33 cm/s LV PW:         1.80 cm   LV E/e' medial:  16.9 LV IVS:        1.60 cm   LV e' lateral:   4.90 cm/s LVOT diam:     1.80 cm   LV E/e' lateral: 18.4 LVOT Area:     2.54 cm  LEFT ATRIUM           Index        RIGHT ATRIUM           Index LA diam:      5.00 cm 2.95 cm/m   RA Area:     17.90 cm LA Vol (A4C): 61.8 ml 36.40 ml/m  RA Volume:   42.10 ml  24.80 ml/m  AORTIC VALVE              PULMONIC VALVE AV Vmax:       163.00 cm/s PV Vmax:       0.74 m/s AV Peak Grad: 10.6 mmHg   PV Peak grad:  2.2 mmHg  AORTA Ao Root diam: 3.10 cm Ao Asc diam:  3.30 cm MITRAL VALVE               TRICUSPID VALVE MV Area (PHT): 3.24 cm    TR Peak grad:   28.1 mmHg MV Decel Time: 234 msec    TR Vmax:        265.00 cm/s MV E velocity: 90.10 cm/s MV A velocity: 33.00 cm/s  SHUNTS MV E/A ratio:  2.73        Systemic Diam: 1.80 cm Darryle Decent MD Electronically signed by Darryle Decent MD Signature Date/Time: 03/18/2024/7:10:34 PM    Final    US  RENAL Result Date: 03/18/2024 CLINICAL DATA:  Acute kidney injury EXAM: RENAL / URINARY TRACT ULTRASOUND COMPLETE COMPARISON:  None Available. FINDINGS: Right Kidney: Renal measurements: 8.6 cm x 4.3 cm x 4.2 cm = volume: 82 mL. Increased echogenicity of the renal parenchyma is noted. No mass or hydronephrosis visualized. Left Kidney: Renal measurements: 8.9 cm x 3.8 cm x 3.9 cm = volume: 69 mL. Increased echogenicity of the renal parenchyma is noted. A 1.6 cm x 1.5 cm x 1.5 cm simple left renal cyst is seen. No hydronephrosis is visualized. Bladder: Appears normal for degree of bladder distention. Other:  None. IMPRESSION: 1. Echogenic kidneys likely secondary to medical renal disease. 2. Simple left renal cyst. Electronically Signed   By: Suzen Dials M.D.   On: 03/18/2024 10:14   CT Chest Wo Contrast Result Date: 03/18/2024 CLINICAL DATA:  Orthopnea EXAM: CT CHEST WITHOUT CONTRAST TECHNIQUE: Multidetector CT imaging of the chest was performed following the standard protocol without IV contrast. RADIATION DOSE REDUCTION: This exam was performed according to the departmental dose-optimization program which includes automated exposure control, adjustment of the mA and/or kV according to patient size and/or use of iterative reconstruction technique. COMPARISON:  Chest x-ray from earlier in the same day. FINDINGS: Cardiovascular: Atherosclerotic calcifications of the thoracic aorta are noted.  Coronary calcifications are seen. Minimal pericardial effusion is noted. No cardiac enlargement is noted. Pacing device is seen. Mediastinum/Nodes: Thoracic inlet is within normal limits. No hilar or mediastinal adenopathy is noted. Esophagus as visualized is within normal limits. Lungs/Pleura: Lungs are well aerated bilaterally. No focal confluent infiltrate is seen. Scattered pulmonary nodules noted bilaterally. The largest of these measures 6 mm in dimension. No sizable effusion is seen. Upper Abdomen: Nonobstructing left renal stone is noted. No acute abnormality noted. Musculoskeletal: No chest wall mass or suspicious bone lesions identified. IMPRESSION: Multiple pulmonary nodules. Most significant: Solid pulmonary nodule measuring 6 mm. Per Fleischner Society Guidelines, recommend a non-contrast Chest CT at 3-6 months, then consider another non-contrast Chest CT at 18-24 months. If patient is low risk for malignancy, non-contrast Chest CT at 18-24 months is optional. These guidelines do not apply to immunocompromised patients and patients with cancer. Follow up in patients with significant comorbidities as clinically warranted. For lung cancer screening, adhere to Lung-RADS guidelines. Reference: Radiology. 2017; 284(1):228-43. Mild pericardial effusion. Electronically Signed   By: Oneil Devonshire M.D.   On: 03/18/2024 02:43   DG Chest 2 View Result Date: 03/18/2024 CLINICAL DATA:  Cough EXAM: CHEST - 2 VIEW COMPARISON:  02/08/2023 FINDINGS: Left pacer remains in place, unchanged. Cardiomegaly. No confluent opacities, effusions or edema. No acute bony abnormality. IMPRESSION: Cardiomegaly.  No active disease. Electronically Signed   By: Franky Crease M.D.   On: 03/18/2024 01:06    Microbiology: Results for orders placed or performed during the hospital encounter of 03/18/24  Resp panel by RT-PCR (RSV, Flu A&B, Covid) Anterior Nasal Swab     Status: None   Collection Time: 03/18/24 12:32 AM   Specimen:  Anterior Nasal Swab  Result Value Ref Range Status   SARS Coronavirus 2 by RT PCR NEGATIVE NEGATIVE Final    Comment: (NOTE) SARS-CoV-2 target nucleic acids are NOT DETECTED.  The SARS-CoV-2 RNA is generally detectable in upper respiratory specimens during the acute phase of infection. The lowest concentration of SARS-CoV-2 viral copies this assay can detect is 138 copies/mL. A negative result does not preclude SARS-Cov-2 infection and should not be used as the sole basis for treatment or other patient management decisions. A negative result may occur with  improper specimen collection/handling, submission of specimen other than nasopharyngeal swab, presence of viral mutation(s) within the areas targeted by this assay, and inadequate number of viral copies(<138 copies/mL). A negative result must be combined with clinical observations, patient history, and epidemiological information. The expected result is Negative.  Fact Sheet for Patients:  BloggerCourse.com  Fact Sheet for Healthcare Providers:  SeriousBroker.it  This test is no t yet approved or cleared by the [place]  FDA and  has been authorized for detection and/or diagnosis of SARS-CoV-2 by FDA under an  Emergency Use Authorization (EUA). This EUA will remain  in effect (meaning this test can be used) for the duration of the COVID-19 declaration under Section 564(b)(1) of the Act, 21 U.S.C.section 360bbb-3(b)(1), unless the authorization is terminated  or revoked sooner.       Influenza A by PCR NEGATIVE NEGATIVE Final   Influenza B by PCR NEGATIVE NEGATIVE Final    Comment: (NOTE) The Xpert Xpress SARS-CoV-2/FLU/RSV plus assay is intended as an aid in the diagnosis of influenza from Nasopharyngeal swab specimens and should not be used as a sole basis for treatment. Nasal washings and aspirates are unacceptable for Xpert Xpress SARS-CoV-2/FLU/RSV testing.  Fact  Sheet for Patients: BloggerCourse.com  Fact Sheet for Healthcare Providers: SeriousBroker.it  This test is not yet approved or cleared by the [place]  FDA and has been authorized for detection and/or diagnosis of SARS-CoV-2 by FDA under an Emergency Use Authorization (EUA). This EUA will remain in effect (meaning this test can be used) for the duration of the COVID-19 declaration under Section 564(b)(1) of the Act, 21 U.S.C. section 360bbb-3(b)(1), unless the authorization is terminated or revoked.     Resp Syncytial Virus by PCR NEGATIVE NEGATIVE Final    Comment: (NOTE) Fact Sheet for Patients: BloggerCourse.com  Fact Sheet for Healthcare Providers: SeriousBroker.it  This test is not yet approved or cleared by the [place]  FDA and has been authorized for detection and/or diagnosis of SARS-CoV-2 by FDA under an Emergency Use Authorization (EUA). This EUA will remain in effect (meaning this test can be used) for the duration of the COVID-19 declaration under Section 564(b)(1) of the Act, 21 U.S.C. section 360bbb-3(b)(1), unless the authorization is terminated or revoked.  Performed at [facility], [address] [place], [place] [address]   Blood culture (routine x 2)     Status: None   Collection Time: 03/18/24  1:38 AM   Specimen: BLOOD  Result Value Ref Range Status   Specimen Description BLOOD BLOOD RIGHT WRIST  Final   Special Requests   Final    BOTTLES DRAWN AEROBIC AND ANAEROBIC Blood Culture results may not be optimal due to an inadequate volume of blood received in culture bottles   Culture   Final    NO GROWTH 5 DAYS Performed at [facility], [address] [place], [place] [address]    Report Status 03/23/2024 FINAL  Final  Blood culture (routine x 2)     Status: None   Collection Time: 03/18/24  1:38 AM   Specimen: BLOOD   Result Value Ref Range Status   Specimen Description BLOOD BLOOD LEFT WRIST  Final   Special Requests   Final    BOTTLES DRAWN AEROBIC AND ANAEROBIC Blood Culture results may not be optimal due to an inadequate volume of blood received in culture bottles   Culture   Final    NO GROWTH 5 DAYS Performed at [facility], [address] [place], [place] [address]    Report Status 03/23/2024 FINAL  Final  Respiratory (~20 pathogens) panel by PCR     Status: Abnormal   Collection Time: 03/18/24  1:38 AM   Specimen: Nasopharyngeal Swab; Respiratory  Result Value Ref Range Status   Adenovirus NOT DETECTED NOT DETECTED Final   Coronavirus 229E NOT DETECTED NOT DETECTED Final    Comment: (NOTE) The Coronavirus on the Respiratory Panel, DOES NOT test for the novel  Coronavirus (2019 nCoV)    Coronavirus HKU1 NOT DETECTED NOT DETECTED Final  Coronavirus NL63 NOT DETECTED NOT DETECTED Final   Coronavirus OC43 NOT DETECTED NOT DETECTED Final   Metapneumovirus NOT DETECTED NOT DETECTED Final   Rhinovirus / Enterovirus DETECTED (A) NOT DETECTED Final   Influenza A NOT DETECTED NOT DETECTED Final   Influenza B NOT DETECTED NOT DETECTED Final   Parainfluenza Virus 1 NOT DETECTED NOT DETECTED Final   Parainfluenza Virus 2 NOT DETECTED NOT DETECTED Final   Parainfluenza Virus 3 NOT DETECTED NOT DETECTED Final   Parainfluenza Virus 4 NOT DETECTED NOT DETECTED Final   Respiratory Syncytial Virus NOT DETECTED NOT DETECTED Final   Bordetella pertussis NOT DETECTED NOT DETECTED Final   Bordetella Parapertussis NOT DETECTED NOT DETECTED Final   Chlamydophila pneumoniae NOT DETECTED NOT DETECTED Final   Mycoplasma pneumoniae NOT DETECTED NOT DETECTED Final    Comment: Performed at [facility], [address] [place], [place] [address]    Labs: CBC: Recent Labs  Lab 03/20/24 1044 03/21/24 0408 03/22/24 0547 03/23/24 0353 03/24/24 0436  WBC 24.0* 20.9* 17.4* 20.4* 15.6*  HGB  11.5* 11.2* 10.9* 11.1* 10.9*  HCT 32.6* 34.7* 33.8* 33.8* 32.0*  MCV 87.6 92.0 93.9 93.4 91.4  PLT 232 248 231 241 231   Basic Metabolic Panel: Recent Labs  Lab 03/19/24 0343 03/20/24 1044 03/21/24 0408 03/22/24 0547 03/23/24 0630 03/24/24 0436  NA 136 141 142 146* 145 141  K 4.8 4.4 4.2 4.3 4.2 4.4  CL 103 109 112* 114* 115* 111  CO2 23 20* 21* 20* 19* 20*  GLUCOSE 96 108* 107* 110* 102* 90  BUN 53* 61* 68* 67* 62* 62*  CREATININE 2.07* 2.32* 2.16* 1.84* 1.52* 1.59*  CALCIUM 9.4 9.4 9.4 9.5 9.6 9.6  MG 2.5* 2.0 2.4 2.3 2.4  --   PHOS 4.3 3.8 4.1 3.4 3.1  --    Liver Function Tests: No results for input(s): AST, ALT, ALKPHOS, BILITOT, PROT, ALBUMIN in the last 168 hours. CBG: Recent Labs  Lab 03/22/24 2132 03/23/24 0058  GLUCAP 121* 101*    Discharge time spent:  34 minutes.  Signed: Drue ONEIDA Potter, MD [facility] 03/24/2024

## 2024-03-27 ENCOUNTER — Other Ambulatory Visit: Payer: Self-pay

## 2024-03-27 LAB — CUP PACEART REMOTE DEVICE CHECK
Battery Remaining Longevity: 67 mo
Battery Voltage: 2.96 V
Brady Statistic AP VP Percent: 0 %
Brady Statistic AP VS Percent: 0 %
Brady Statistic AS VP Percent: 99.43 %
Brady Statistic AS VS Percent: 0.57 %
Brady Statistic RA Percent Paced: 0 %
Brady Statistic RV Percent Paced: 99.43 %
Date Time Interrogation Session: 20251018174611
Implantable Lead Connection Status: 753985
Implantable Lead Connection Status: 753985
Implantable Lead Implant Date: 20120106
Implantable Lead Implant Date: 20120106
Implantable Lead Location: 753858
Implantable Lead Location: 753860
Implantable Lead Model: 5076
Implantable Pulse Generator Implant Date: 20200717
Lead Channel Impedance Value: 323 Ohm
Lead Channel Impedance Value: 3306 Ohm
Lead Channel Impedance Value: 3306 Ohm
Lead Channel Impedance Value: 418 Ohm
Lead Channel Impedance Value: 475 Ohm
Lead Channel Impedance Value: 589 Ohm
Lead Channel Impedance Value: 627 Ohm
Lead Channel Impedance Value: 741 Ohm
Lead Channel Impedance Value: 988 Ohm
Lead Channel Pacing Threshold Amplitude: 0.875 V
Lead Channel Pacing Threshold Pulse Width: 0.4 ms
Lead Channel Setting Pacing Amplitude: 2.5 V
Lead Channel Setting Pacing Pulse Width: 0.4 ms
Lead Channel Setting Sensing Sensitivity: 2.8 mV
Zone Setting Status: 755011

## 2024-03-28 NOTE — Progress Notes (Signed)
 Remote PPM Transmission

## 2024-03-29 ENCOUNTER — Encounter: Admitting: Family

## 2024-03-30 MED ORDER — FUROSEMIDE 20 MG PO TABS: ORAL_TABLET | ORAL | 0 refills | Status: DC

## 2024-04-02 ENCOUNTER — Ambulatory Visit: Payer: Self-pay | Admitting: Cardiology

## 2024-04-03 IMAGING — CT CT ABD-PELV W/O CM
2 of 4 series · 15 of 46 positions shown, 17 images · non-contrast
Comparison: None.

CLINICAL DATA: Nausea and vomiting.  Acute pancreatitis.



[Series 2: routine abd/pel wo · axial · 0.98mm/px · z∈[-673,-278]mm · 12 of 87 slices shown, 14 images]
[im 4/87  soft-tissue]
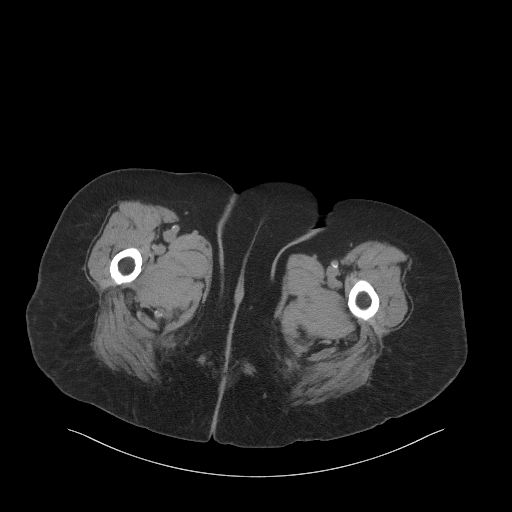
[im 4/87  bone]
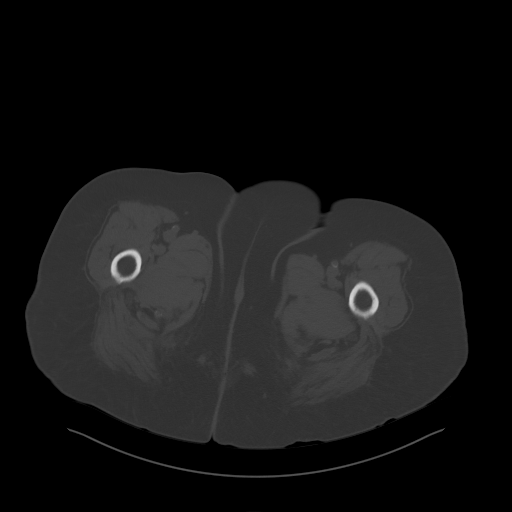
[im 11/87  soft-tissue]
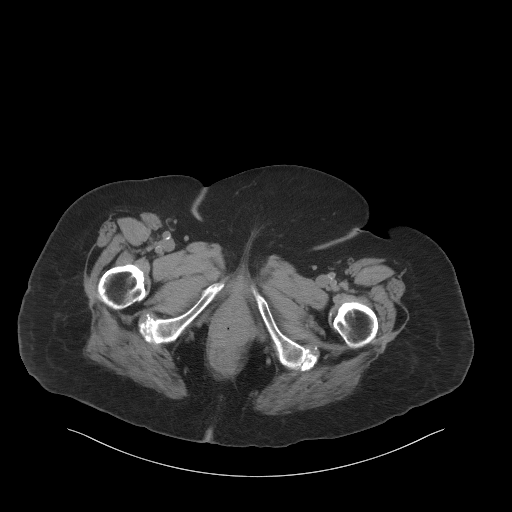
[im 18/87  soft-tissue]
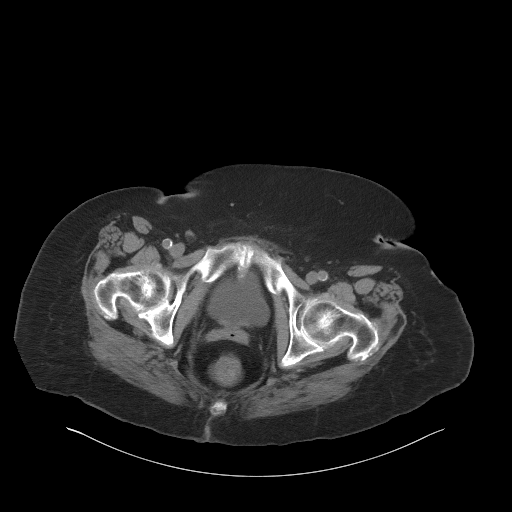
[im 26/87  soft-tissue]
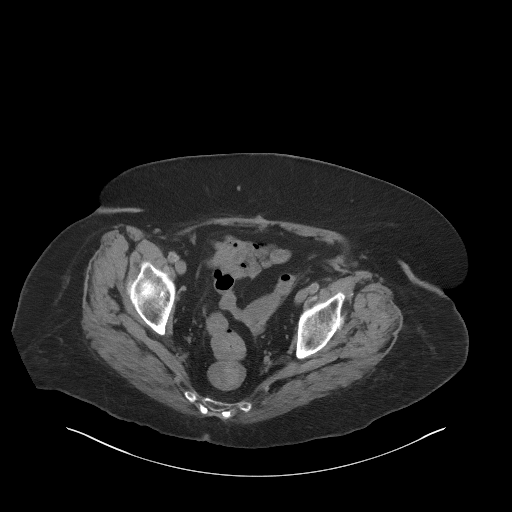
[im 33/87  soft-tissue]
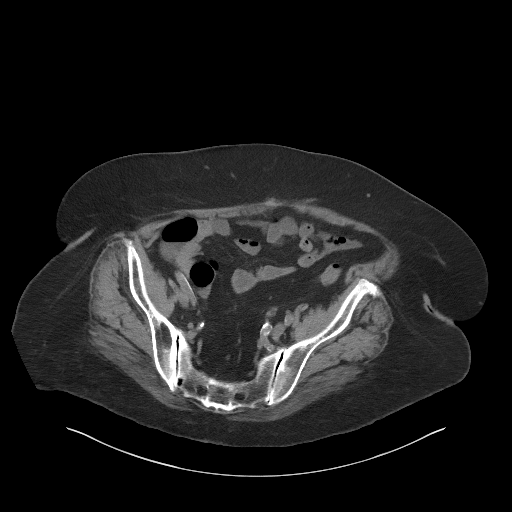
[im 40/87  soft-tissue]
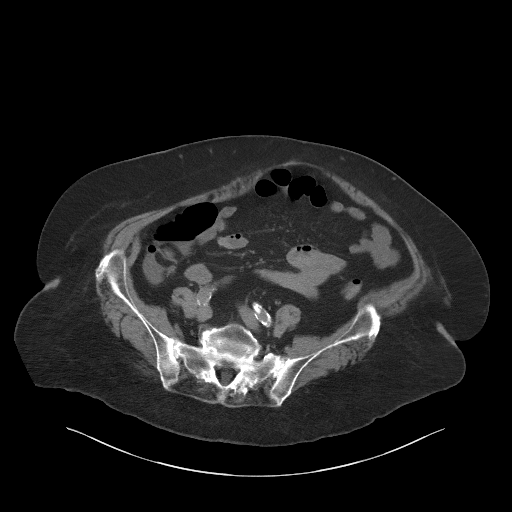
[im 47/87  soft-tissue]
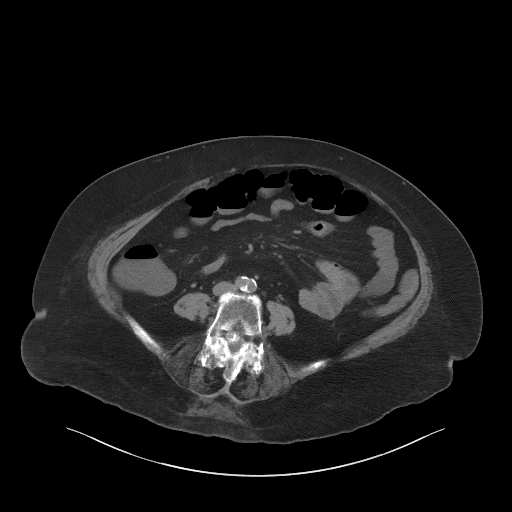
[im 54/87  soft-tissue]
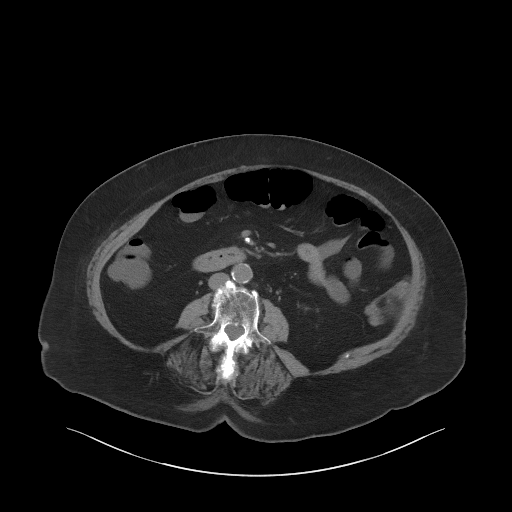
[im 61/87  soft-tissue]
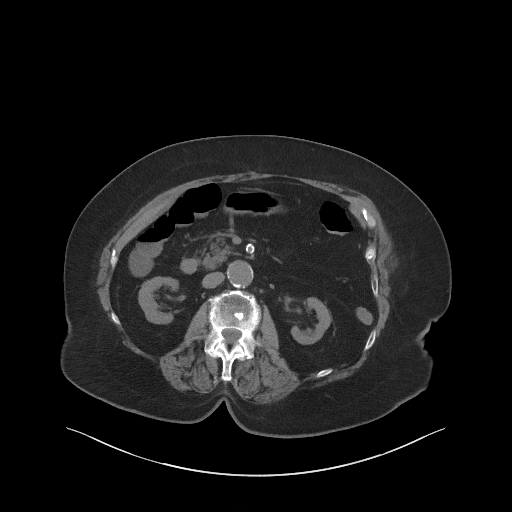
[im 61/87  bone]
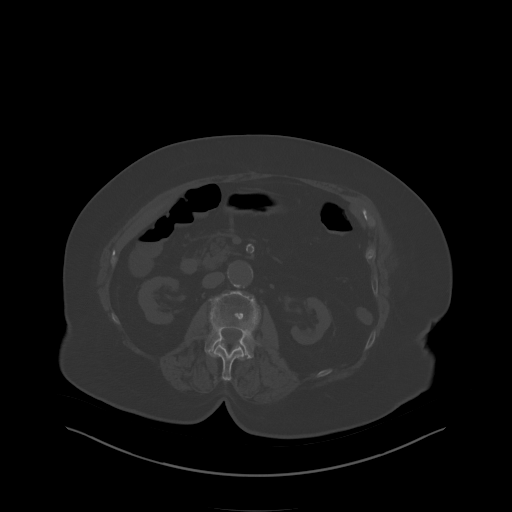
[im 69/87  soft-tissue]
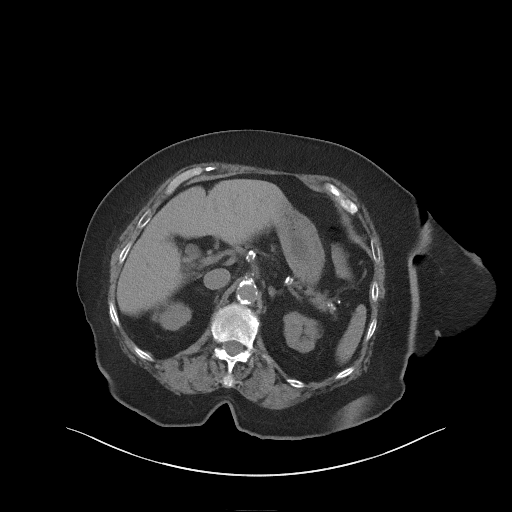
[im 76/87  soft-tissue]
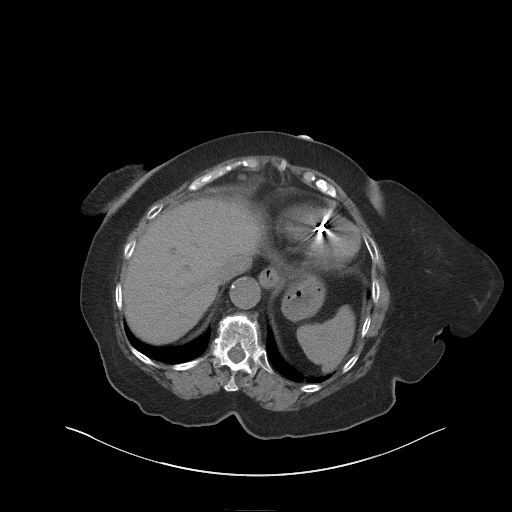
[im 83/87  soft-tissue]
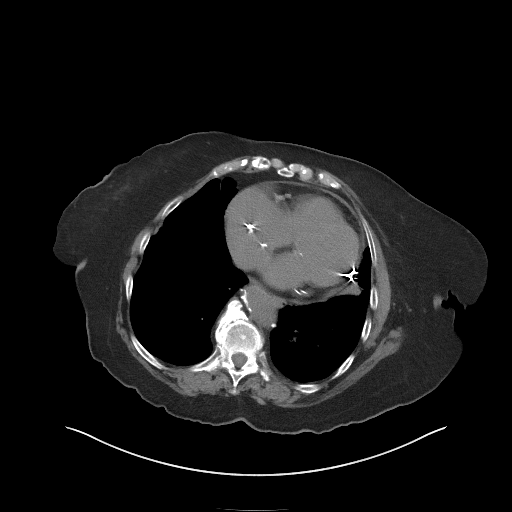

[Series 5: coronal st · coronal · 0.73mm/px · 3 of 97 slices shown]
[im 33/97  soft-tissue]
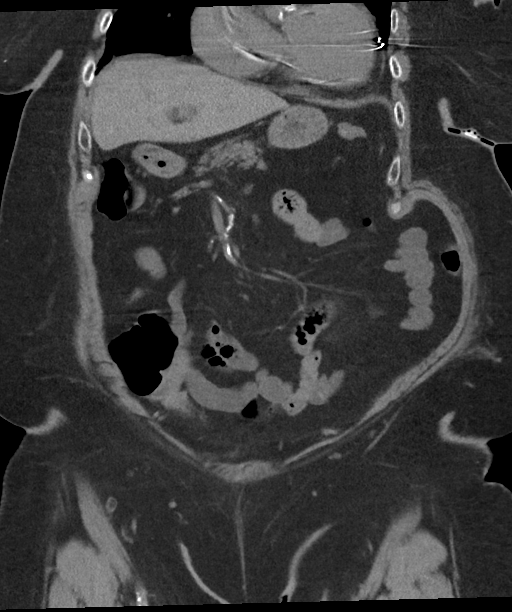
[im 43/97  soft-tissue]
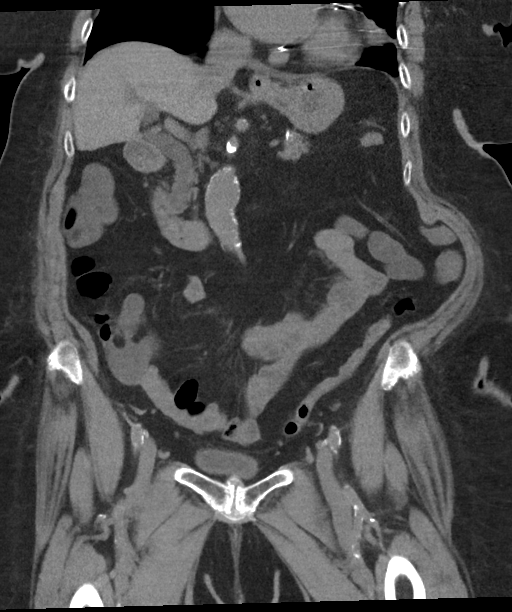
[im 54/97  soft-tissue]
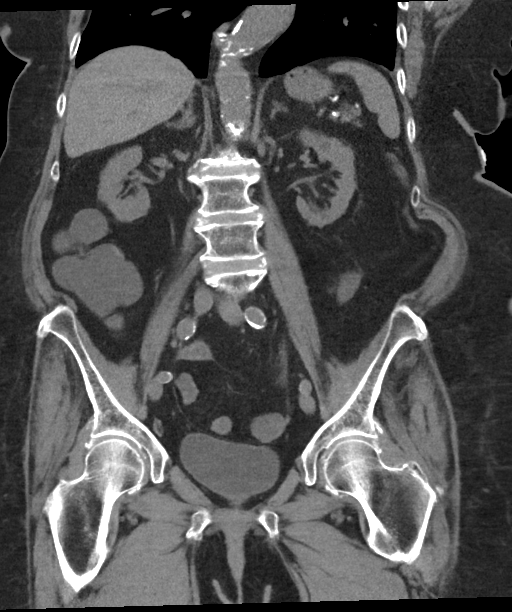

[15 of 46 positions shown; findings below may reference images not displayed]

FINDINGS: Lower chest: 4 mm subpleural right lower lobe nodule. Mild
compressive atelectasis in the lingula. The heart is enlarged.
Pacemaker wires partially included. Trace pericardial effusion.
Aortic atherosclerosis and tortuosity.

Hepatobiliary: No focal hepatic abnormality. Mild motion artifact
the lower aspect of the liver. The gallbladder is not visualized,
surgically absent per history. Common bile duct is dilated measuring
12 mm, with tapering at the duodenal insertion. There is no
visualized choledocholithiasis. Mild central intrahepatic biliary
ductal dilatation.

Pancreas: Atrophic. No peripancreatic inflammation or evidence of
pancreatitis. No ductal dilatation. No pancreatic mass on this
unenhanced exam.

Spleen: Normal in size without focal abnormality.

Adrenals/Urinary Tract: No adrenal nodule. Bilateral renal
parenchymal thinning. No hydronephrosis. No renal calculi. No focal
renal abnormality. Unremarkable urinary bladder.

Stomach/Bowel: Decompressed stomach. No bowel obstruction.
Occasional fluid-filled loops of small bowel that are not abnormally
distended or inflamed. Fatty hypertrophy of the terminal ileum. The
appendix is not visualized, no appendicitis fluid/liquid stool in
the ascending and transverse colon. There is no associated colonic
wall thickening or pericolonic edema. The more distal colon is less
distended.

Vascular/Lymphatic: Aortic atherosclerosis. No aortic aneurysm.
Advanced mesenteric branch atherosclerosis. No portal venous or
mesenteric gas. No bulky abdominopelvic adenopathy.

Reproductive: Hysterectomy.  No adnexal mass.

Other: No ascites. No free air. Small fat containing umbilical
hernia.

Musculoskeletal: Subjective bony under mineralization. Degenerative
change throughout the spine and involving the pubic symphysis. There
are no acute or suspicious osseous abnormalities.
IMPRESSION: 1. No CT evidence of acute pancreatitis.
2. Fluid/liquid stool in the ascending and transverse colon,
recommend correlation for diarrheal illness. No bowel obstruction or
inflammation.
3. Dilated common bile duct with tapering at the duodenal insertion,
likely secondary to prior cholecystectomy. Recommend correlation
with LFTs. Consider ultrasound evaluation. MRCP should only be
considered if patient is able to tolerate breath hold technique.

Aortic Atherosclerosis ([id number]).

## 2024-04-09 ENCOUNTER — Encounter

## 2024-04-10 ENCOUNTER — Ambulatory Visit: Admitting: Cardiovascular Disease

## 2024-04-12 ENCOUNTER — Ambulatory Visit: Admitting: Physician Assistant

## 2024-04-13 ENCOUNTER — Ambulatory Visit: Attending: Cardiology

## 2024-04-13 ENCOUNTER — Telehealth: Payer: Self-pay

## 2024-04-13 DIAGNOSIS — Z950 Presence of cardiac pacemaker: Secondary | ICD-10-CM | POA: Diagnosis not present

## 2024-04-13 DIAGNOSIS — I5032 Chronic diastolic (congestive) heart failure: Secondary | ICD-10-CM | POA: Diagnosis not present

## 2024-04-13 NOTE — Progress Notes (Signed)
 EPIC Encounter for ICM Monitoring  Patient Name: Jacqueline Lozano is a 89 y.o. female Date: 04/13/2024 Primary Care Physican: Valora Lynwood FALCON, MD Primary Cardiologist: Perla Electrophysiologist: Cindie Pore Pacing:  99.6%       09/21/2021 Office Weight: 182 lbs 10/31/202 Office Weight: 177 lbs (does not weigh at home) 07/06/2022 Weight: not weighing at home  01/04/2024 Weight: Not weighing at home 02/03/2024 Office Weight: 163 lbs   Attempted call to daughter per DPR and unable to reach.  Transmission results reviewed.  Hospitalized 03/18/2024-03/24/2024 for pneumonia per discharge note followed by inpatient rehab.     Since 03/05/2024 ICM Remote Transmission: Optivol thoracic impedance suggesting possible fluid accumulation starting 03/07/2024 and returned close to baseline on 04/09/2024.     Prescribed:  Furosemide  20 mg Take 1 tablet (20 mg total) by mouth as directed.  Take once daily with extra dose after lunch if you have leg swelling.   Potassium 10 mEq 1 tablet daily   Labs: 03/24/2024 Creatinine 1.59, BUN 62, Potassium 4.4, Sodium 141, GFR 31  03/23/2024 Creatinine 1.52, BUN 62, Potassium 4.2, Sodium 145, GFR 33  03/22/2024 Creatinine 1.84, BUN 67, Potassium 4.3, Sodium 146, GFR 26  03/21/2024 Creatinine 2.16, BUN 68, Potassium 4.2, Sodium 142, GFR 22 03/20/2024 Creatinine 2.32, BUN 61, Potassium 4.4, Sodium 141, GFR 20  A complete set of results can be found in Results Review.   Recommendations:  Unable to reach.     Follow-up plan: ICM clinic phone appointment on 04/18/2024 to recheck fluid levels.  91 day device clinic remote transmission 06/21/2024.    EP/Cardiology Office Visits:  Canceled 04/10/2024 with Dr [name] (yearly).  Canceled 04/23/2024 with Suzann Riddle, NP.      Copy of ICM check sent to Dr. Cindie.    Remote monitoring is medically necessary for Heart Failure Management.    Daily Thoracic Impedance ICM trend: 01/09/2024 through 04/09/2024.    12-14 Month  Thoracic Impedance ICM trend:     Mitzie GORMAN Garner, RN 04/13/2024 4:35 PM

## 2024-04-13 NOTE — Telephone Encounter (Signed)
Remote ICM transmission received.  Attempted call to daughter regarding ICM remote transmission and no answer.

## 2024-04-18 ENCOUNTER — Ambulatory Visit: Attending: Cardiology

## 2024-04-18 DIAGNOSIS — Z950 Presence of cardiac pacemaker: Secondary | ICD-10-CM

## 2024-04-18 DIAGNOSIS — I5032 Chronic diastolic (congestive) heart failure: Secondary | ICD-10-CM

## 2024-04-18 NOTE — Progress Notes (Signed)
 EPIC Encounter for ICM Monitoring  Patient Name: CLEOPHA INDELICATO is a 89 y.o. female Date: 04/18/2024 Primary Care Physican: Valora Lynwood FALCON, MD Primary Cardiologist: Perla Electrophysiologist: Kennyth Pore Pacing:  99.6%       09/21/2021 Office Weight: 182 lbs 10/31/202 Office Weight: 177 lbs (does not weigh at home) 07/06/2022 Weight: not weighing at home  01/04/2024 Weight: Not weighing at home 02/03/2024 Office Weight: 163 lbs   Spoke with daughter Maeola.  She reports patient is in hospice and rehab did not help her condition. She is not eating or drinking very much at this time.  Hospitalized 03/18/2024-03/24/2024 for pneumonia per discharge note followed by inpatient rehab.     Since 04/13/2024 ICM Remote Transmission: Optivol thoracic impedance suggesting possible fluid accumulation starting 03/07/2024 and trending closer to baseline since 04/11/2024.     Prescribed:  Furosemide  20 mg Take 1 tablet (20 mg total) by mouth as directed.  Take once daily with extra dose after lunch if you have leg swelling.   Potassium 10 mEq 1 tablet daily   Labs: 03/24/2024 Creatinine 1.59, BUN 62, Potassium 4.4, Sodium 141, GFR 31  03/23/2024 Creatinine 1.52, BUN 62, Potassium 4.2, Sodium 145, GFR 33  03/22/2024 Creatinine 1.84, BUN 67, Potassium 4.3, Sodium 146, GFR 26  03/21/2024 Creatinine 2.16, BUN 68, Potassium 4.2, Sodium 142, GFR 22 03/20/2024 Creatinine 2.32, BUN 61, Potassium 4.4, Sodium 141, GFR 20  A complete set of results can be found in Results Review.   Recommendations:  Advised will no longer continue monthly fluid level checks and she will continued to be monitored by device clinic as needed.    Follow-up plan:  No further ICM clinic phone appointments.  91 day device clinic remote transmission 06/21/2024.    EP/Cardiology Office Visits:  Canceled 04/10/2024 with Dr [name] (yearly).  Canceled 04/23/2024 with Suzann Riddle, NP.      Copy of ICM check sent to Dr. Kennyth.     Remote monitoring is medically necessary for Heart Failure Management.    Daily Thoracic Impedance ICM trend: 01/18/2024 through 04/18/2024.    12-14 Month Thoracic Impedance ICM trend:     Mitzie GORMAN Garner, RN 04/18/2024 8:19 AM

## 2024-04-23 ENCOUNTER — Ambulatory Visit: Admitting: Cardiology

## 2024-05-21 ENCOUNTER — Ambulatory Visit

## 2024-06-21 ENCOUNTER — Ambulatory Visit (INDEPENDENT_AMBULATORY_CARE_PROVIDER_SITE_OTHER)

## 2024-06-21 DIAGNOSIS — I5032 Chronic diastolic (congestive) heart failure: Secondary | ICD-10-CM

## 2024-06-21 LAB — CUP PACEART REMOTE DEVICE CHECK
Battery Remaining Longevity: 65 mo
Battery Voltage: 2.96 V
Brady Statistic AP VP Percent: 0 %
Brady Statistic AP VS Percent: 0 %
Brady Statistic AS VP Percent: 99.52 %
Brady Statistic AS VS Percent: 0.48 %
Brady Statistic RA Percent Paced: 0 %
Brady Statistic RV Percent Paced: 99.52 %
Date Time Interrogation Session: 20260114234444
Implantable Lead Connection Status: 753985
Implantable Lead Connection Status: 753985
Implantable Lead Implant Date: 20120106
Implantable Lead Implant Date: 20120106
Implantable Lead Location: 753858
Implantable Lead Location: 753860
Implantable Lead Model: 5076
Implantable Pulse Generator Implant Date: 20200717
Lead Channel Impedance Value: 1178 Ohm
Lead Channel Impedance Value: 3306 Ohm
Lead Channel Impedance Value: 3306 Ohm
Lead Channel Impedance Value: 456 Ohm
Lead Channel Impedance Value: 513 Ohm
Lead Channel Impedance Value: 551 Ohm
Lead Channel Impedance Value: 665 Ohm
Lead Channel Impedance Value: 722 Ohm
Lead Channel Impedance Value: 798 Ohm
Lead Channel Pacing Threshold Amplitude: 0.5 V
Lead Channel Pacing Threshold Pulse Width: 0.4 ms
Lead Channel Setting Pacing Amplitude: 2.5 V
Lead Channel Setting Pacing Pulse Width: 0.4 ms
Lead Channel Setting Sensing Sensitivity: 2.8 mV
Zone Setting Status: 755011

## 2024-06-24 ENCOUNTER — Ambulatory Visit: Payer: Self-pay | Admitting: Cardiology

## 2024-06-28 ENCOUNTER — Other Ambulatory Visit: Payer: Self-pay | Admitting: Cardiovascular Disease

## 2024-06-28 NOTE — Progress Notes (Signed)
 Remote PPM Transmission

## 2024-07-05 NOTE — Telephone Encounter (Signed)
 Abs on 03/24/24 Outside of Normal Range  In accordance with refill protocols, please review and address the following requirements before this medication refill can be authorized:  Labs

## 2024-07-08 DEATH — deceased

## 2024-09-20 ENCOUNTER — Ambulatory Visit

## 2024-12-20 ENCOUNTER — Ambulatory Visit

## 2025-03-21 ENCOUNTER — Ambulatory Visit
